# Patient Record
Sex: Female | Born: 1960 | Race: Black or African American | Hispanic: No | Smoking: Former smoker
Health system: Southern US, Community
[De-identification: ages and names within clinical notes are randomized; demographics above are authoritative.]

## PROBLEM LIST (undated history)

## (undated) DIAGNOSIS — L900 Lichen sclerosus et atrophicus: Secondary | ICD-10-CM

## (undated) DIAGNOSIS — K5900 Constipation, unspecified: Secondary | ICD-10-CM

## (undated) DIAGNOSIS — D071 Carcinoma in situ of vulva: Secondary | ICD-10-CM

## (undated) DIAGNOSIS — E7800 Pure hypercholesterolemia, unspecified: Secondary | ICD-10-CM

## (undated) DIAGNOSIS — R202 Paresthesia of skin: Secondary | ICD-10-CM

## (undated) DIAGNOSIS — Z79890 Hormone replacement therapy (postmenopausal): Secondary | ICD-10-CM

## (undated) DIAGNOSIS — M7989 Other specified soft tissue disorders: Secondary | ICD-10-CM

## (undated) DIAGNOSIS — R011 Cardiac murmur, unspecified: Secondary | ICD-10-CM

## (undated) DIAGNOSIS — R200 Anesthesia of skin: Secondary | ICD-10-CM

## (undated) DIAGNOSIS — T7840XA Allergy, unspecified, initial encounter: Secondary | ICD-10-CM

## (undated) DIAGNOSIS — R252 Cramp and spasm: Secondary | ICD-10-CM

## (undated) DIAGNOSIS — F32A Depression, unspecified: Secondary | ICD-10-CM

## (undated) DIAGNOSIS — C55 Malignant neoplasm of uterus, part unspecified: Secondary | ICD-10-CM

## (undated) DIAGNOSIS — F329 Major depressive disorder, single episode, unspecified: Secondary | ICD-10-CM

## (undated) DIAGNOSIS — J45909 Unspecified asthma, uncomplicated: Secondary | ICD-10-CM

## (undated) DIAGNOSIS — D649 Anemia, unspecified: Secondary | ICD-10-CM

## (undated) DIAGNOSIS — N390 Urinary tract infection, site not specified: Secondary | ICD-10-CM

## (undated) DIAGNOSIS — F419 Anxiety disorder, unspecified: Secondary | ICD-10-CM

## (undated) DIAGNOSIS — K219 Gastro-esophageal reflux disease without esophagitis: Secondary | ICD-10-CM

## (undated) DIAGNOSIS — I10 Essential (primary) hypertension: Secondary | ICD-10-CM

## (undated) HISTORY — DX: Pure hypercholesterolemia, unspecified: E78.00

## (undated) HISTORY — DX: Allergy, unspecified, initial encounter: T78.40XA

## (undated) HISTORY — DX: Carcinoma in situ of vulva: D07.1

## (undated) HISTORY — DX: Anxiety disorder, unspecified: F41.9

## (undated) HISTORY — DX: Malignant neoplasm of uterus, part unspecified: C55

## (undated) HISTORY — DX: Constipation, unspecified: K59.00

## (undated) HISTORY — DX: Urinary tract infection, site not specified: N39.0

## (undated) HISTORY — DX: Lichen sclerosus et atrophicus: L90.0

## (undated) HISTORY — PX: WISDOM TOOTH EXTRACTION: SHX21

## (undated) HISTORY — DX: Essential (primary) hypertension: I10

## (undated) HISTORY — DX: Major depressive disorder, single episode, unspecified: F32.9

## (undated) HISTORY — PX: VAGINAL HYSTERECTOMY: SUR661

## (undated) HISTORY — PX: SPINE SURGERY: SHX786

## (undated) HISTORY — DX: Anemia, unspecified: D64.9

## (undated) HISTORY — PX: EYE SURGERY: SHX253

## (undated) HISTORY — DX: Unspecified asthma, uncomplicated: J45.909

## (undated) HISTORY — DX: Hormone replacement therapy: Z79.890

## (undated) HISTORY — DX: Depression, unspecified: F32.A

---

## 1989-05-24 DIAGNOSIS — C55 Malignant neoplasm of uterus, part unspecified: Secondary | ICD-10-CM

## 1989-05-24 HISTORY — DX: Malignant neoplasm of uterus, part unspecified: C55

## 2011-06-05 DIAGNOSIS — Z23 Encounter for immunization: Secondary | ICD-10-CM | POA: Diagnosis not present

## 2011-06-10 DIAGNOSIS — F329 Major depressive disorder, single episode, unspecified: Secondary | ICD-10-CM | POA: Diagnosis not present

## 2011-06-10 DIAGNOSIS — F3289 Other specified depressive episodes: Secondary | ICD-10-CM | POA: Diagnosis not present

## 2011-07-27 DIAGNOSIS — N390 Urinary tract infection, site not specified: Secondary | ICD-10-CM | POA: Diagnosis not present

## 2011-07-28 DIAGNOSIS — N390 Urinary tract infection, site not specified: Secondary | ICD-10-CM | POA: Diagnosis not present

## 2011-08-11 DIAGNOSIS — L940 Localized scleroderma [morphea]: Secondary | ICD-10-CM | POA: Diagnosis not present

## 2011-08-11 DIAGNOSIS — N952 Postmenopausal atrophic vaginitis: Secondary | ICD-10-CM | POA: Diagnosis not present

## 2011-08-11 DIAGNOSIS — IMO0002 Reserved for concepts with insufficient information to code with codable children: Secondary | ICD-10-CM | POA: Diagnosis not present

## 2011-08-31 DIAGNOSIS — N952 Postmenopausal atrophic vaginitis: Secondary | ICD-10-CM | POA: Diagnosis not present

## 2011-08-31 DIAGNOSIS — IMO0002 Reserved for concepts with insufficient information to code with codable children: Secondary | ICD-10-CM | POA: Diagnosis not present

## 2011-08-31 DIAGNOSIS — L940 Localized scleroderma [morphea]: Secondary | ICD-10-CM | POA: Diagnosis not present

## 2011-09-30 DIAGNOSIS — E785 Hyperlipidemia, unspecified: Secondary | ICD-10-CM | POA: Diagnosis not present

## 2011-09-30 DIAGNOSIS — D649 Anemia, unspecified: Secondary | ICD-10-CM | POA: Diagnosis not present

## 2011-09-30 DIAGNOSIS — R7309 Other abnormal glucose: Secondary | ICD-10-CM | POA: Diagnosis not present

## 2011-09-30 DIAGNOSIS — I10 Essential (primary) hypertension: Secondary | ICD-10-CM | POA: Diagnosis not present

## 2011-10-12 DIAGNOSIS — N952 Postmenopausal atrophic vaginitis: Secondary | ICD-10-CM | POA: Diagnosis not present

## 2011-10-12 DIAGNOSIS — IMO0002 Reserved for concepts with insufficient information to code with codable children: Secondary | ICD-10-CM | POA: Diagnosis not present

## 2011-11-29 DIAGNOSIS — J309 Allergic rhinitis, unspecified: Secondary | ICD-10-CM | POA: Diagnosis not present

## 2011-12-29 DIAGNOSIS — N644 Mastodynia: Secondary | ICD-10-CM | POA: Diagnosis not present

## 2012-01-05 DIAGNOSIS — R209 Unspecified disturbances of skin sensation: Secondary | ICD-10-CM | POA: Diagnosis not present

## 2012-01-05 DIAGNOSIS — N644 Mastodynia: Secondary | ICD-10-CM | POA: Diagnosis not present

## 2012-01-05 DIAGNOSIS — I491 Atrial premature depolarization: Secondary | ICD-10-CM | POA: Diagnosis not present

## 2012-01-05 DIAGNOSIS — R920 Mammographic microcalcification found on diagnostic imaging of breast: Secondary | ICD-10-CM | POA: Diagnosis not present

## 2012-02-14 DIAGNOSIS — L940 Localized scleroderma [morphea]: Secondary | ICD-10-CM | POA: Diagnosis not present

## 2012-02-14 DIAGNOSIS — D071 Carcinoma in situ of vulva: Secondary | ICD-10-CM | POA: Diagnosis not present

## 2012-03-14 DIAGNOSIS — M545 Low back pain, unspecified: Secondary | ICD-10-CM | POA: Diagnosis not present

## 2012-04-04 DIAGNOSIS — E785 Hyperlipidemia, unspecified: Secondary | ICD-10-CM | POA: Diagnosis not present

## 2012-04-04 DIAGNOSIS — Z23 Encounter for immunization: Secondary | ICD-10-CM | POA: Diagnosis not present

## 2012-04-04 DIAGNOSIS — D649 Anemia, unspecified: Secondary | ICD-10-CM | POA: Diagnosis not present

## 2012-04-04 DIAGNOSIS — R7309 Other abnormal glucose: Secondary | ICD-10-CM | POA: Diagnosis not present

## 2012-04-04 DIAGNOSIS — I10 Essential (primary) hypertension: Secondary | ICD-10-CM | POA: Diagnosis not present

## 2012-04-27 DIAGNOSIS — J0100 Acute maxillary sinusitis, unspecified: Secondary | ICD-10-CM | POA: Diagnosis not present

## 2012-06-26 ENCOUNTER — Ambulatory Visit (INDEPENDENT_AMBULATORY_CARE_PROVIDER_SITE_OTHER): Payer: Medicare Other | Admitting: Family Medicine

## 2012-06-26 VITALS — BP 130/75 | HR 85 | Temp 98.3°F | Resp 18 | Ht 66.0 in | Wt 186.0 lb

## 2012-06-26 DIAGNOSIS — N898 Other specified noninflammatory disorders of vagina: Secondary | ICD-10-CM | POA: Diagnosis not present

## 2012-06-26 DIAGNOSIS — N951 Menopausal and female climacteric states: Secondary | ICD-10-CM | POA: Diagnosis not present

## 2012-06-26 DIAGNOSIS — R5381 Other malaise: Secondary | ICD-10-CM | POA: Diagnosis not present

## 2012-06-26 DIAGNOSIS — R5383 Other fatigue: Secondary | ICD-10-CM

## 2012-06-26 LAB — TSH: TSH: 1.358 u[IU]/mL (ref 0.350–4.500)

## 2012-06-26 LAB — POCT CBC
Lymph, poc: 3.4 (ref 0.6–3.4)
MCHC: 31.3 g/dL — AB (ref 31.8–35.4)
MID (cbc): 0.6 (ref 0–0.9)
MPV: 10.2 fL (ref 0–99.8)
POC Granulocyte: 5.6 (ref 2–6.9)
POC LYMPH PERCENT: 35.1 %L (ref 10–50)
POC MID %: 6.7 %M (ref 0–12)
Platelet Count, POC: 181 10*3/uL (ref 142–424)
RDW, POC: 14.7 %

## 2012-06-26 LAB — POCT WET PREP WITH KOH

## 2012-06-26 MED ORDER — FLUCONAZOLE 150 MG PO TABS: 150.0000 mg | ORAL_TABLET | Freq: Once | ORAL | Status: DC

## 2012-06-26 MED ORDER — METRONIDAZOLE 500 MG PO TABS: 500.0000 mg | ORAL_TABLET | Freq: Two times a day (BID) | ORAL | Status: DC

## 2012-06-26 NOTE — Progress Notes (Signed)
Subjective:    Patient ID: Kristin Merritt, female    DOB: 07-14-1960, 52 y.o.   MRN: [id number] Chief Complaint  Patient presents with  . Vaginal Discharge    possible yeast x 3 days  . Hot Flashes    HPI  Kristin Merritt is a pleasant 52 yo woman who has been having copious vaginal discharge for the past several days.  It started rather suddenly - in fact she thought she was getting her menses as she felt a warm gooey fluid in her underwear. Having a copious amount of white creamy discharge - not cottage cheese like, no itching, no odor, no n/v. Is urinating freq due to hctz and drinking a lot of water but no changes in freq, no incontinence or dysuria. Has not used estrace - vaginal cream - in over a month.   Having a little constipation with a little abdominal pain. Is sexually active - last was 1 mo ago, and did not use protection. Last pap smear was early Dec in another city - was normal - has had a hysterectomy - so maybe just had a pelvic exam - she is not sure.   Is having a lot of hot flashes - is miserable.  Periods stopped in 1991 when she had her hysterectomy but they left ovaries in - first noticed hot flashes 2 wks ago and no other menopausal symptoms other than hot flashes.   Has an appt w/ a new PCP at March 30th and does not have an OB doctor. OB in [place] didn't know anyone here to refer her to.  Past Medical History  Diagnosis Date  . Allergy   . Anxiety    Past Surgical History  Procedure Date  . Eye surgery   . Abdominal hysterectomy   . Spine surgery     Current Outpatient Prescriptions on File Prior to Visit  Medication Sig Dispense Refill  . albuterol (PROVENTIL HFA;VENTOLIN HFA) 108 (90 BASE) MCG/ACT inhaler Inhale 2 puffs into the lungs every 6 (six) hours as needed.      Marland Kitchen atorvastatin (LIPITOR) 20 MG tablet Take 20 mg by mouth daily.      . Fluticasone-Salmeterol (ADVAIR) 250-50 MCG/DOSE AEPB Inhale 1 puff into the lungs as needed.      . lansoprazole (PREVACID)  30 MG capsule Take 30 mg by mouth daily.      . Olmesartan-Amlodipine-HCTZ 40-5-12.5 MG TABS Take by mouth 1 day or 1 dose.       No Known Allergies   Review of Systems  Constitutional: Positive for diaphoresis. Negative for fever, chills, activity change, appetite change, fatigue and unexpected weight change.  Gastrointestinal: Negative for abdominal pain, diarrhea, constipation, blood in stool, anal bleeding and rectal pain.  Genitourinary: Positive for frequency and vaginal discharge. Negative for dysuria, urgency, hematuria, decreased urine volume, vaginal bleeding, difficulty urinating, genital sores, vaginal pain, menstrual problem, pelvic pain and dyspareunia.  Musculoskeletal: Negative for gait problem.  Skin: Negative for rash.  Hematological: Negative for adenopathy.  Psychiatric/Behavioral: Negative for dysphoric mood. The patient is not nervous/anxious.       BP 130/75  Pulse 85  Temp 98.3 F (36.8 C)  Resp 18  Ht 5\' 6"  (1.676 m)  Wt 186 lb (84.369 kg)  BMI 30.02 kg/m2  SpO2 98% Objective:   Physical Exam  Constitutional: She is oriented to person, place, and time. She appears well-developed and well-nourished. No distress.  HENT:  Head: Normocephalic and atraumatic.  Cardiovascular: Normal rate, regular  rhythm, normal heart sounds and intact distal pulses.   Pulmonary/Chest: Effort normal and breath sounds normal.  Abdominal: Soft. Bowel sounds are normal. She exhibits no distension. There is no tenderness. There is no rebound and no guarding.  Genitourinary: Uterus normal. Pelvic exam was performed with patient supine. There is no rash, tenderness or lesion on the right labia. There is no rash, tenderness or lesion on the left labia. Cervix exhibits discharge. Cervix exhibits no motion tenderness and no friability. Right adnexum displays no mass, no tenderness and no fullness. Left adnexum displays no mass, no tenderness and no fullness. No erythema or tenderness around  the vagina. Vaginal discharge found.       Copious amount of thin white-grayish discharge  Lymphadenopathy:       Right: No inguinal adenopathy present.       Left: No inguinal adenopathy present.  Neurological: She is alert and oriented to person, place, and time.  Skin: Skin is warm and dry. She is not diaphoretic.  Psychiatric: She has a normal mood and affect. Her behavior is normal.          Results for orders placed in visit on 06/26/12  POCT WET PREP WITH KOH      Component Value Range   Trichomonas, UA Negative     Clue Cells Wet Prep HPF POC 0-2     Epithelial Wet Prep HPF POC 5-8     Yeast Wet Prep HPF POC neg     Bacteria Wet Prep HPF POC 1+     RBC Wet Prep HPF POC neg     WBC Wet Prep HPF POC 0-1     KOH Prep POC Negative    POCT CBC      Component Value Range   WBC 9.7  4.6 - 10.2 K/uL   Lymph, poc 3.4  0.6 - 3.4   POC LYMPH PERCENT 35.1  10 - 50 %L   MID (cbc) 0.6  0 - 0.9   POC MID % 6.7  0 - 12 %M   POC Granulocyte 5.6  2 - 6.9   Granulocyte percent 58.2  37 - 80 %G   RBC 4.90  4.04 - 5.48 M/uL   Hemoglobin 13.2  12.2 - 16.2 g/dL   HCT, POC 81.1  91.4 - 47.9 %   MCV 86.2  80 - 97 fL   MCH, POC 26.9 (*) 27 - 31.2 pg   MCHC 31.3 (*) 31.8 - 35.4 g/dL   RDW, POC 78.2     Platelet Count, POC 181  142 - 424 K/uL   MPV 10.2  0 - 99.8 fL    Assessment & Plan:   1. Fatigue  POCT CBC, TSH  2. Vaginal discharge  POCT Wet Prep with KOH, metroNIDAZOLE (FLAGYL) 500 MG tablet, fluconazole (DIFLUCAN) 150 MG tablet  3. Hot flash, menopausal  Ambulatory referral to Gynecology  Pt is on uti preventative cipro as well as prn estrogen vag cream which could all contribute to yeast. However, discharge on exam was more consistent w/ BV. As the wet prep did not show either - but pt clearly needs treatment as she does have COPIOUS discharge - will cover for both. RTC if sxs cont or recur after treatment. Gave info to pt on herbal supplements that some people use for hot  flashes and she is going to try but she is also interested in considering HRT so will refer to gyn for further discussion and poss  treatment since she could not get a PCP appt for another 2 mos. Meds ordered this encounter  Medications                                                                                      . metroNIDAZOLE (FLAGYL) 500 MG tablet    Sig: Take 1 tablet (500 mg total) by mouth 2 (two) times daily with a meal. DO NOT CONSUME ALCOHOL WHILE TAKING THIS MEDICATION.    Dispense:  14 tablet    Refill:  0  . fluconazole (DIFLUCAN) 150 MG tablet    Sig: Take 1 tablet (150 mg total) by mouth once. Repeat if needed    Dispense:  2 tablet    Refill:  0

## 2012-06-26 NOTE — Patient Instructions (Signed)
Sometimes antidepressants like zoloft can help with hot flashes and some women need hormone replacement therapy (by taking estrogen) - however, this can be connected with increased cardiac and cancer risk so would only want to be done with careful monitoring by your regular doctor.  Menopause and Herbal Products Menopause is the normal time of life when menstrual periods stop completely. Menopause is complete when you have missed 12 consecutive menstrual periods. It usually occurs between the ages of 55 to 58, with an average age of 13. Very rarely does a woman develop menopause before 52 years old. At menopause, your ovaries stop producing the female hormones, estrogen and progesterone. This can cause undesirable symptoms and also affect your health. Sometimes the symptoms can occur 4 to 5 years before the menopause begins. There is no relationship between menopause and:  Oral contraceptives.  Number of children you had.  Race.  The age your menstrual periods started (menarche). Heavy smokers and very thin women may develop menopause earlier in life. Estrogen and progesterone hormone treatment is the usual method of treating menopausal symptoms. However, there are women who should not take hormone treatment. This is true of:   Women that have breast or uterine cancer.  Women who prefer not to take hormones because of certain side effects (abnormal uterine bleeding).  Women who are afraid that hormones may cause breast cancer.  Women who have a history of liver disease, heart disease, stroke, or blood clots. For these women, there are other medications that may help treat their menopausal symptoms. These medications are found in plants and botanical products. They can be found in the form of herbs, teas, oils, tinctures, and pills.  CAUSES:  The ovaries stop producing the female hormones estrogen and progesterone.  Other causes include:  Surgery to remove both ovaries.  The ovaries  stop functioning for no know reason.  Tumors of the pituitary gland in the brain.  Medical disease that affects the ovaries and hormone production.  Radiation treatment to the abdomen or pelvis.  Chemotherapy that affects the ovaries. PHYTOESTROGENS: Phytoestrogen's occur naturally in plants and plant products. They act like estrogen in the body. Herbal medications are made from these plants and botanical steroids. There are 3 types of phytoestrogens:  Isoflavones (genistein and daidzein) are found in soy, garbanzo beans, miso and tofu foods.  Ligins are found in the shell of seeds. They are used to make oils like flaxseed oil. The bacteria in your intestine act on these foods to produce the estrogen-like hormones.  Coumestans are estrogen-like. Some of the foods they are found in include sunflower seeds and bean sprouts. CONDITIONS AND THERE POSSIBLE HERBAL TREATMENT:  Hot flashes and night sweats.  Soy, black cohosh and evening primrose.  Irritability, insomnia, depression and memory problems.  Chasteberry, ginseng, and soy.  St. John's wort may be helpful for depression. However, there is a concern of it causing cataracts of the eye and may have bad effects on other medications. St. John's wort should not be taken for long time and without your caregiver's advice.  Loss of libido and vaginal and skin dryness.  Wild yam and soy.  Prevention of coronary heart disease and osteoporosis.  Soy and Isoflavones. Several studies have shown that some women benefit from herbal medications, but most of the studies have not consistently shown that these supplements are much better than placebo. Other forms of treatment to help women with menopausal symptoms include a balanced diet, rest, exercise, vitamin and calcium (with  vitamin D) supplements, acupuncture, and group therapy when necessary. THOSE WHO SHOULD NOT TAKE HERBAL MEDICATIONS INCLUDE:  Women who are planning on getting  pregnant unless told by your caregiver.  Women who are breastfeeding unless told by your caregiver.  Women who are taking other prescription medications unless told by your caregiver.  Infants, children, and elderly women unless told by your caregiver. Different herbal medications have different and unmeasured amounts of the herbal ingredients. There are no regulations, quality control, and standardization of the ingredients in herbal medications. Therefore, the amount of the ingredient in the medication may vary from one herb, pill, tea, oil or tincture to another. Many herbal medications can cause serious problems and can even have poisonous effects if taken too much or too long. If problems develop, the medication should be stopped and recorded by your caregiver. HOME CARE INSTRUCTIONS  Do not take or give children herbal medications without your caregiver's advice.  Let your caregiver know all the medications you are taking. This includes prescription, over-the-counter, eye drops, and creams.  Do not take herbal medications longer or more than recommended.  Tell your caregiver about any side effects from the medication. SEEK MEDICAL CARE IF:  You develop a fever of 102 F (38.9 C), or as directed by your caregiver.  You feel sick to your stomach (nauseous), vomit, or have diarrhea.  You develop a rash.  You develop abdominal pain.  You develop severe headaches.  You start to have vision problems.  You feel dizzy or faint.  You start to feel numbness in any part of your body.  You start shaking (have convulsions). Document Released: 10/27/2007 Document Revised: 08/02/2011 Document Reviewed: 05/26/2010 [facility] Patient Information 2013 [place], [place]. Perimenopause Perimenopause is the time when your body begins to move into the menopause (no menstrual period for 12 straight months). It is a natural process. Perimenopause can begin 2 to 8 years before the menopause and  usually lasts for one year after the menopause. During this time, your ovaries may or may not produce an egg. The ovaries vary in their production of estrogen and progesterone hormones each month. This can cause irregular menstrual periods, difficulty in getting pregnant, vaginal bleeding between periods and uncomfortable symptoms. CAUSES  Irregular production of the ovarian hormones, estrogen and progesterone, and not ovulating every month.  Other causes include:  Tumor of the pituitary gland in the brain.  Medical disease that affects the ovaries.  Radiation treatment.  Chemotherapy.  Unknown causes.  Heavy smoking and excessive alcohol intake can bring on perimenopause sooner. SYMPTOMS   Hot flashes.  Night sweats.  Irregular menstrual periods.  Decrease sex drive.  Vaginal dryness.  Headaches.  Mood swings.  Depression.  Memory problems.  Irritability.  Tiredness.  Weight gain.  Trouble getting pregnant.  The beginning of losing bone cells (osteoporosis).  The beginning of hardening of the arteries (atherosclerosis). DIAGNOSIS  Your caregiver will make a diagnosis by analyzing your age, menstrual history and your symptoms. They will do a physical exam noting any changes in your body, especially your female organs. Female hormone tests may or may not be helpful depending on the amount and when you produce the female hormones. However, other hormone tests may be helpful (ex. thyroid hormone) to rule out other problems. TREATMENT  The decision to treat during the perimenopause should be made by you and your caregiver depending on how the symptoms are affecting you and your life style. There are various treatments available such as:  Treating individual symptoms with a specific medication for that symptom (ex. tranquilizer for depression).  Herbal medications that can help specific symptoms.  Counseling.  Group therapy.  No treatment. HOME CARE  INSTRUCTIONS   Before seeing your caregiver, make a list of your menstrual periods (when the occur, how heavy they are, how long between periods and how long they last), your symptoms and when they started.  Take the medication as recommended by your caregiver.  Sleep and rest.  Exercise.  Eat a diet that contains calcium (good for your bones) and soy (acts like estrogen hormone).  Do not smoke.  Avoid alcoholic beverages.  Taking vitamin E may help in certain cases.  Take calcium and vitamin D supplements to help prevent bone loss.  Group therapy is sometimes helpful.  Acupuncture may help in some cases. SEEK MEDICAL CARE IF:   You have any of the above and want to know if it is perimenopause.  You want advice and treatment for any of your symptoms mentioned above.  You need a referral to a specialist (gynecologist, psychiatrist or psychologist). SEEK IMMEDIATE MEDICAL CARE IF:   You have vaginal bleeding.  Your period lasts longer than 8 days.  You periods are recurring sooner than 21 days.  You have bleeding after intercourse.  You have severe depression.  You have pain when you urinate.  You have severe headaches.  You develop vision problems. Document Released: 06/17/2004 Document Revised: 08/02/2011 Document Reviewed: 03/07/2008 [facility] Patient Information 2013 [place], [place].

## 2012-06-27 ENCOUNTER — Encounter: Payer: Self-pay | Admitting: Family Medicine

## 2012-07-05 ENCOUNTER — Ambulatory Visit: Payer: Self-pay | Admitting: Gynecology

## 2012-07-08 ENCOUNTER — Other Ambulatory Visit: Payer: Self-pay

## 2012-07-11 ENCOUNTER — Encounter: Payer: Self-pay | Admitting: Gynecology

## 2012-07-11 ENCOUNTER — Ambulatory Visit (INDEPENDENT_AMBULATORY_CARE_PROVIDER_SITE_OTHER): Payer: Medicare Other | Admitting: Gynecology

## 2012-07-11 VITALS — BP 122/80 | Ht 64.75 in | Wt 192.0 lb

## 2012-07-11 DIAGNOSIS — Z79890 Hormone replacement therapy (postmenopausal): Secondary | ICD-10-CM | POA: Diagnosis not present

## 2012-07-11 DIAGNOSIS — N951 Menopausal and female climacteric states: Secondary | ICD-10-CM | POA: Diagnosis not present

## 2012-07-11 HISTORY — DX: Hormone replacement therapy: Z79.890

## 2012-07-11 MED ORDER — ESTRADIOL 1 MG PO TABS: 1.0000 mg | ORAL_TABLET | Freq: Every day | ORAL | Status: DC

## 2012-07-11 NOTE — Progress Notes (Signed)
Patient is a 52 year old new patient to the practice who moved here from new [facility]. She has been complaining over the past month of severe hot flashes and night sweats. Patient has a past history of a transvaginal hysterectomy in [place] as a result of menorrhagia and dysmenorrhea. A few months ago in another town she had been prescribed Estrace vaginal cream but she has been off of it now for one month. She is sexually active. She does have vaginal dryness at times. She denies any mood swings or irritability. She denies any past history of abnormal Pap smears. She stated her last Pap smear was in 2013. She was treated a few weeks ago at the urgent care for BV and moniliasis. She has been referred to a new PCPto manage her  asthma, and hyperlipidemia (Dr. Herold Harms). Patient's last mammogram was a year ago.  We were through a lengthy discussion on the menopause as well as on hormone replacement therapy. We discussed the risks benefits and pros and cons of hormone replacement therapy as well as the women's health initiative study and the risk of breast cancer. We discussed different treatment options such as oral, transdermal patches, estrogen rings, as well as gels. Patient like to proceed because her quality of life is been affected. She will be started on estradiol 1 mg to take 1 by mouth daily. She was reminded to take calcium 1200 mg daily and vitamin D  1000-2000 units per day. We discussed importance of regular exercise for osteoporosis prevention. She will return back in 6 months for followup and annual exam. She was provided with a requisition to schedule mammogram. Literature information on the menopause and hormone replacement therapy was also provided. Patient is a nonsmoker and denies any family history of any bleeding or clotting disorders.

## 2012-07-11 NOTE — Patient Instructions (Addendum)
Menopause Menopause is the normal time of life when menstrual periods stop completely. Menopause is complete when you have missed 12 consecutive menstrual periods. It usually occurs between the ages of 48 to 55, with an average age of 51. Very rarely does a woman develop menopause before 52 years old. At menopause, your ovaries stop producing the female hormones, estrogen and progesterone. This can cause undesirable symptoms and also affect your health. Sometimes the symptoms may occur 4 to 5 years before the menopause begins. There is no relationship between menopause and:  Oral contraceptives.  Number of children you had.  Race.  The age your menstrual periods started (menarche). Heavy smokers and very thin women may develop menopause earlier in life. CAUSES  The ovaries stop producing the female hormones estrogen and progesterone.  Other causes include:  Surgery to remove both ovaries.  The ovaries stop functioning for no known reason.  Tumors of the pituitary gland in the brain.  Medical disease that affects the ovaries and hormone production.  Radiation treatment to the abdomen or pelvis.  Chemotherapy that affects the ovaries. SYMPTOMS   Hot flashes.  Night sweats.  Decrease in sex drive.  Vaginal dryness and thinning of the vagina causing painful intercourse.  Dryness of the skin and developing wrinkles.  Headaches.  Tiredness.  Irritability.  Memory problems.  Weight gain.  Bladder infections.  Hair growth of the face and chest.  Infertility. More serious symptoms include:  Loss of bone (osteoporosis) causing breaks (fractures).  Depression.  Hardening and narrowing of the arteries (atherosclerosis) causing heart attacks and strokes. DIAGNOSIS   When the menstrual periods have stopped for 12 straight months.  Physical exam.  Hormone studies of the blood. TREATMENT  There are many treatment choices and nearly as many questions about them.  The decisions to treat or not to treat menopausal changes is an individual choice made with your caregiver. Your caregiver can discuss the treatments with you. Together, you can decide which treatment will work best for you. Your treatment choices may include:   Hormone therapy (estorgen and progesterone).  Non-hormonal medications.  Treating the individual symptoms with medication (for example antidepressants for depression).  Herbal medications that may help specific symptoms.  Counseling by a psychiatrist or psychologist.  Group therapy.  Lifestyle changes including:  Eating healthy.  Regular exercise.  Limiting caffeine and alcohol.  Stress management and meditation.  No treatment. HOME CARE INSTRUCTIONS   Take the medication your caregiver gives you as directed.  Get plenty of sleep and rest.  Exercise regularly.  Eat a diet that contains calcium (good for the bones) and soy products (acts like estrogen hormone).  Avoid alcoholic beverages.  Do not smoke.  If you have hot flashes, dress in layers.  Take supplements, calcium and vitamin D to strengthen bones.  You can use over-the-counter lubricants or moisturizers for vaginal dryness.  Group therapy is sometimes very helpful.  Acupuncture may be helpful in some cases. SEEK MEDICAL CARE IF:   You are not sure you are in menopause.  You are having menopausal symptoms and need advice and treatment.  You are still having menstrual periods after age 55.  You have pain with intercourse.  Menopause is complete (no menstrual period for 12 months) and you develop vaginal bleeding.  You need a referral to a specialist (gynecologist, psychiatrist or psychologist) for treatment. SEEK IMMEDIATE MEDICAL CARE IF:   You have severe depression.  You have excessive vaginal bleeding.  You fell and   think you have a broken bone.  You have pain when you urinate.  You develop leg or chest pain.  You have a fast  pounding heart beat (palpitations).  You have severe headaches.  You develop vision problems.  You feel a lump in your breast.  You have abdominal pain or severe indigestion. Document Released: 07/31/2003 Document Revised: 08/02/2011 Document Reviewed: 03/07/2008 [facility] Patient Information 2013 [place], [place].   Hormone Therapy At menopause, your body begins making less estrogen and progesterone hormones. This causes the body to stop having menstrual periods. This is because estrogen and progesterone hormones control your periods and menstrual cycle. A lack of estrogen may cause symptoms such as:  Hot flushes (or hot flashes).  Vaginal dryness.  Dry skin.  Loss of sex drive.  Risk of bone loss (osteoporosis). When this happens, you may choose to take hormone therapy to get back the estrogen lost during menopause. When the hormone estrogen is given alone, it is usually referred to as ET (Estrogen Therapy). When the hormone progestin is combined with estrogen, it is generally called HT (Hormone Therapy). This was formerly known as hormone replacement therapy (HRT). Your caregiver can help you make a decision on what will be best for you. The decision to use HT seems to change often as new studies are done. Many studies do not agree on the benefits of hormone replacement therapy. LIKELY BENEFITS OF HT INCLUDE PROTECTION FROM:  Hot Flushes (also called hot flashes) - A hot flush is a sudden feeling of heat that spreads over the face and body. The skin may redden like a blush. It is connected with sweats and sleep disturbance. Women going through menopause may have hot flushes a few times a month or several times per day depending on the woman.  Osteoporosis (bone loss)- Estrogen helps guard against bone loss. After menopause, a woman's bones slowly lose calcium and become weak and brittle. As a result, bones are more likely to break. The hip, wrist, and spine are affected most often.  Hormone therapy can help slow bone loss after menopause. Weight bearing exercise and taking calcium with vitamin D also can help prevent bone loss. There are also medications that your caregiver can prescribe that can help prevent osteoporosis.  Vaginal Dryness - Loss of estrogen causes changes in the vagina. Its lining may become thin and dry. These changes can cause pain and bleeding during sexual intercourse. Dryness can also lead to infections. This can cause burning and itching. (Vaginal estrogen treatment can help relieve pain, itching, and dryness.)  Urinary Tract Infections are more common after menopause because of lack of estrogen. Some women also develop urinary incontinence because of low estrogen levels in the vagina and bladder.  Possible other benefits of estrogen include a positive effect on mood and short-term memory in women. RISKS AND COMPLICATIONS  Using estrogen alone without progesterone causes the lining of the uterus to grow. This increases the risk of lining of the uterus (endometrial) cancer. Your caregiver should give another hormone called progestin if you have a uterus.  Women who take combined (estrogen and progestin) HT appear to have an increased risk of breast cancer. The risk appears to be small, but increases throughout the time that HT is taken.  Combined therapy also makes the breast tissue slightly denser which makes it harder to read mammograms (breast X-rays).  Combined, estrogen and progesterone therapy can be taken together every day, in which case there may be spotting of blood. HT  therapy can be taken cyclically in which case you will have menstrual periods. Cyclically means HT is taken for a set amount of days, then not taken, then this process is repeated.  HT may increase the risk of stroke, heart attack, breast cancer and forming blood clots in your leg.  Transdermal estrogen (estrogen that is absorbed through the skin with a patch or a cream) may  have more positive results with:  Cholesterol.  Blood pressure.  Blood clots. Having the following conditions may indicate you should not have HT:  Endometrial cancer.  Liver disease.  Breast cancer.  Heart disease.  History of blood clots.  Stroke. TREATMENT   If you choose to take HT and have a uterus, usually estrogen and progestin are prescribed.  Your caregiver will help you decide the best way to take the medications.  Possible ways to take estrogen include:  Pills.  Patches.  Gels.  Sprays.  Vaginal estrogen cream, rings and tablets.  It is best to take the lowest dose possible that will help your symptoms and take them for the shortest period of time that you can.  Hormone therapy can help relieve some of the problems (symptoms) that affect women at menopause. Before making a decision about HT, talk to your caregiver about what is best for you. Be well informed and comfortable with your decisions. HOME CARE INSTRUCTIONS   Follow your caregivers advice when taking the medications.  A Pap test is done to screen for cervical cancer.  The first Pap test should be done at age 24.  Between ages 70 and 58, Pap tests are repeated every 2 years.  Beginning at age 69, you are advised to have a Pap test every 3 years as long as your past 3 Pap tests have been normal.  Some women have medical problems that increase the chance of getting cervical cancer. Talk to your caregiver about these problems. It is especially important to talk to your caregiver if a new problem develops soon after your last Pap test. In these cases, your caregiver may recommend more frequent screening and Pap tests.  The above recommendations are the same for women who have or have not gotten the vaccine for HPV (Human Papillomavirus).  If you had a hysterectomy for a problem that was not a cancer or a condition that could lead to cancer, then you no longer need Pap tests. However, even if  you no longer need a Pap test, a regular exam is a good idea to make sure no other problems are starting.   If you are between ages 62 and 45, and you have had normal Pap tests going back 10 years, you no longer need Pap tests. However, even if you no longer need a Pap test, a regular exam is a good idea to make sure no other problems are starting.   If you have had past treatment for cervical cancer or a condition that could lead to cancer, you need Pap tests and screening for cancer for at least 20 years after your treatment.  If Pap tests have been discontinued, risk factors (such as a new sexual partner) need to be re-assessed to determine if screening should be resumed.  Some women may need screenings more often if they are at high risk for cervical cancer.  Get mammograms done as per the advice of your caregiver. SEEK IMMEDIATE MEDICAL CARE IF:  You develop abnormal vaginal bleeding.  You have pain or swelling in your legs,  shortness of breath, or chest pain.  You develop dizziness or headaches.  You have lumps or changes in your breasts or armpits.  You have slurred speech.  You develop weakness or numbness of your arms or legs.  You have pain, burning, or bleeding when urinating.  You develop abdominal pain. Document Released: 02/06/2003 Document Revised: 08/02/2011 Document Reviewed: 05/27/2010 [facility] Patient Information 2013 [place], [place].

## 2012-07-13 ENCOUNTER — Encounter: Payer: Self-pay | Admitting: Gynecology

## 2012-07-13 ENCOUNTER — Other Ambulatory Visit: Payer: Self-pay | Admitting: Gynecology

## 2012-07-13 DIAGNOSIS — Z1231 Encounter for screening mammogram for malignant neoplasm of breast: Secondary | ICD-10-CM

## 2012-07-28 ENCOUNTER — Ambulatory Visit (HOSPITAL_COMMUNITY): Payer: Medicare Other

## 2012-08-01 ENCOUNTER — Ambulatory Visit (INDEPENDENT_AMBULATORY_CARE_PROVIDER_SITE_OTHER): Payer: Medicare Other | Admitting: Family Medicine

## 2012-08-01 ENCOUNTER — Encounter: Payer: Self-pay | Admitting: Family Medicine

## 2012-08-01 VITALS — BP 110/65 | HR 80 | Temp 98.1°F | Resp 18 | Wt 195.0 lb

## 2012-08-01 DIAGNOSIS — J029 Acute pharyngitis, unspecified: Secondary | ICD-10-CM | POA: Diagnosis not present

## 2012-08-01 DIAGNOSIS — B370 Candidal stomatitis: Secondary | ICD-10-CM

## 2012-08-01 DIAGNOSIS — R5383 Other fatigue: Secondary | ICD-10-CM

## 2012-08-01 DIAGNOSIS — J019 Acute sinusitis, unspecified: Secondary | ICD-10-CM

## 2012-08-01 DIAGNOSIS — N898 Other specified noninflammatory disorders of vagina: Secondary | ICD-10-CM

## 2012-08-01 MED ORDER — FIRST-DUKES MOUTHWASH MT SUSP: OROMUCOSAL | Status: DC

## 2012-08-01 MED ORDER — FLUCONAZOLE 150 MG PO TABS: 150.0000 mg | ORAL_TABLET | Freq: Once | ORAL | Status: DC

## 2012-08-01 MED ORDER — AMOXICILLIN 500 MG PO CAPS: 1000.0000 mg | ORAL_CAPSULE | Freq: Three times a day (TID) | ORAL | Status: DC

## 2012-08-01 MED ORDER — PREDNISONE 10 MG PO TABS: ORAL_TABLET | ORAL | Status: DC

## 2012-08-01 NOTE — Progress Notes (Signed)
Subjective:    Patient ID: Kristin Merritt, female    DOB: 01-31-1961, 52 y.o.   MRN: [id number] Chief Complaint  Patient presents with  . Sore Throat  . Otalgia     HPI Hot flashes are better.  Vaginal discharge went away after last treatmet.  Right side of tongue is white and looks like she has thrush - now with sore throat and swelling and adenopathy ion Right - trouble swallowing and burining. This morning now nare feels swollen and raw and she was snoring. Lots of pressure in the left side. Eyes sore and sinus sore.  Is pushing fluids. No home remidies but has been trying some otc fiber, stool softeners, nyquil.  No f/c, no dental problems, little rhinorrea, lots of ear pain - feels very deep. No SHoB, slight cough  Past Medical History  Diagnosis Date  . Allergy   . Anxiety   . Hypertension   . Lichen sclerosus et atrophicus   . 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 Postmenopausal HRT (hormone replacement therapy) - followed by Dr. Lily Peer in gyn 07/11/2012  . Asthma   . Uterine cancer   . Depression   . High cholesterol   . UTI (urinary tract infection)    Current Outpatient Prescriptions on File Prior to Visit  Medication Sig Dispense Refill  . albuterol (PROVENTIL HFA;VENTOLIN HFA) 108 (90 BASE) MCG/ACT inhaler Inhale 2 puffs into the lungs every 6 (six) hours as needed.      Marland Kitchen aspirin 81 MG tablet Take 81 mg by mouth daily.      Marland Kitchen docusate sodium (COLACE) 50 MG capsule Take by mouth 2 (two) times daily. Over the counter Equate stool softener      . Fluticasone-Salmeterol (ADVAIR) 250-50 MCG/DOSE AEPB Inhale 1 puff into the lungs as needed.       No current facility-administered medications on file prior to visit.   No Known Allergies   Review of Systems    BP 110/65  Pulse 80  Temp(Src) 98.1 F (36.7 C) (Oral)  Resp 18  Wt 195 lb (88.451 kg)  BMI 32.69 kg/m2 Objective:   Physical Exam  Constitutional: She is oriented to  person, place, and time. She appears well-developed and well-nourished. She appears lethargic. She appears ill. No distress.  HENT:  Head: Normocephalic and atraumatic.  Right Ear: External ear and ear canal normal. Tympanic membrane is retracted. A middle ear effusion is present.  Left Ear: External ear and ear canal normal. Tympanic membrane is injected and retracted. A middle ear effusion is present.  Nose: Mucosal edema and rhinorrhea present. Right sinus exhibits maxillary sinus tenderness. Left sinus exhibits maxillary sinus tenderness.  Mouth/Throat: Uvula is midline and mucous membranes are normal. Posterior oropharyngeal edema and posterior oropharyngeal erythema present. No oropharyngeal exudate or tonsillar abscesses.  Rt tonsil 1+, Lt tonsil nml. Tongue with thick white green coating, cannot scrape off  Eyes: Conjunctivae are normal. Right eye exhibits no discharge. Left eye exhibits no discharge. No scleral icterus.  Allergic shiners  Neck: Normal range of motion. Neck supple.  Cardiovascular: Normal rate, regular rhythm, normal heart sounds and intact distal pulses.   Pulmonary/Chest: Effort normal and breath sounds normal.  Lymphadenopathy:       Head (right side): Submandibular and tonsillar adenopathy present. No preauricular, no posterior auricular and no occipital adenopathy present.       Head (left side): Submandibular and tonsillar adenopathy present. No preauricular,  no posterior auricular and no occipital adenopathy present.    She has cervical adenopathy.       Right cervical: Superficial cervical adenopathy present.       Left cervical: Superficial cervical adenopathy present.       Right: No supraclavicular adenopathy present.       Left: No supraclavicular adenopathy present.  Neurological: She is oriented to person, place, and time. She appears lethargic.  Skin: Skin is warm and dry. She is not diaphoretic. No erythema.  Psychiatric: She has a normal mood and  affect. Her behavior is normal.      Assessment & Plan:  Vaginal discharge  Fatigue  Acute pharyngitis  Thrush - Plan: Diphenhyd-Hydrocort-Nystatin (FIRST-DUKES MOUTHWASH) SUSP, fluconazole (DIFLUCAN) 150 MG tablet  Sinusitis, acute - Plan: predniSONE (DELTASONE) 10 MG tablet, amoxicillin (AMOXIL) 500 MG capsule  Meds ordered this encounter  Medications  . DISCONTD: Diphenhyd-Hydrocort-Nystatin (FIRST-DUKES MOUTHWASH) SUSP    Sig: Gargle with 1 teaspon 4 times a day. Retain in mouth for as long as possible. Use till sxs resolve x 48 hrs.    Dispense:  237 mL    Refill:  0  . DISCONTD: predniSONE (DELTASONE) 10 MG tablet    Sig: Take 6 tabs today, 5 tabs tomorrow, 4 tabs po d3, 3 tabs x 1 d4, 2 tabs po x 1 d5, 1 tab po x1 d6    Dispense:  21 tablet    Refill:  0  . DISCONTD: fluconazole (DIFLUCAN) 150 MG tablet    Sig: Take 1 tablet (150 mg total) by mouth once. Repeat if needed    Dispense:  2 tablet    Refill:  0  . DISCONTD: amoxicillin (AMOXIL) 500 MG capsule    Sig: Take 2 capsules (1,000 mg total) by mouth 3 (three) times daily.    Dispense:  60 capsule    Refill:  0

## 2012-08-01 NOTE — Patient Instructions (Addendum)
Hot showers or breathing in steam may help loosen the congestion.  Using a netti pot or sinus rinse is also likely to help you feel better and keep this from progressing.  I recommend augmenting with 12 hr sudafed (behind the counter) and generic mucinex to help you move out the congestion.  If no improvement or you are getting worse, come back but hopefully with all of the above, you can avoid it.  Sinusitis Sinusitis is redness, soreness, and swelling (inflammation) of the paranasal sinuses. Paranasal sinuses are air pockets within the bones of your face (beneath the eyes, the middle of the forehead, or above the eyes). In healthy paranasal sinuses, mucus is able to drain out, and air is able to circulate through them by way of your nose. However, when your paranasal sinuses are inflamed, mucus and air can become trapped. This can allow bacteria and other germs to grow and cause infection. Sinusitis can develop quickly and last only a short time (acute) or continue over a long period (chronic). Sinusitis that lasts for more than 12 weeks is considered chronic.  CAUSES  Causes of sinusitis include:  Allergies.  Structural abnormalities, such as displacement of the cartilage that separates your nostrils (deviated septum), which can decrease the air flow through your nose and sinuses and affect sinus drainage.  Functional abnormalities, such as when the small hairs (cilia) that line your sinuses and help remove mucus do not work properly or are not present. SYMPTOMS  Symptoms of acute and chronic sinusitis are the same. The primary symptoms are pain and pressure around the affected sinuses. Other symptoms include:  Upper toothache.  Earache.  Headache.  Bad breath.  Decreased sense of smell and taste.  A cough, which worsens when you are lying flat.  Fatigue.  Fever.  Thick drainage from your nose, which often is green and may contain pus (purulent).  Swelling and warmth over the  affected sinuses. DIAGNOSIS  Your caregiver will perform a physical exam. During the exam, your caregiver may:  Look in your nose for signs of abnormal growths in your nostrils (nasal polyps).  Tap over the affected sinus to check for signs of infection.  View the inside of your sinuses (endoscopy) with a special imaging device with a light attached (endoscope), which is inserted into your sinuses. If your caregiver suspects that you have chronic sinusitis, one or more of the following tests may be recommended:  Allergy tests.  Nasal culture A sample of mucus is taken from your nose and sent to a lab and screened for bacteria.  Nasal cytology A sample of mucus is taken from your nose and examined by your caregiver to determine if your sinusitis is related to an allergy. TREATMENT  Most cases of acute sinusitis are related to a viral infection and will resolve on their own within 10 days. Sometimes medicines are prescribed to help relieve symptoms (pain medicine, decongestants, nasal steroid sprays, or saline sprays).  However, for sinusitis related to a bacterial infection, your caregiver will prescribe antibiotic medicines. These are medicines that will help kill the bacteria causing the infection.  Rarely, sinusitis is caused by a fungal infection. In theses cases, your caregiver will prescribe antifungal medicine. For some cases of chronic sinusitis, surgery is needed. Generally, these are cases in which sinusitis recurs more than 3 times per year, despite other treatments. HOME CARE INSTRUCTIONS   Drink plenty of water. Water helps thin the mucus so your sinuses can drain more easily.  Use a humidifier.  Inhale steam 3 to 4 times a day (for example, sit in the bathroom with the shower running).  Apply a warm, moist washcloth to your face 3 to 4 times a day, or as directed by your caregiver.  Use saline nasal sprays to help moisten and clean your sinuses.  Take over-the-counter or  prescription medicines for pain, discomfort, or fever only as directed by your caregiver. SEEK IMMEDIATE MEDICAL CARE IF:  You have increasing pain or severe headaches.  You have nausea, vomiting, or drowsiness.  You have swelling around your face.  You have vision problems.  You have a stiff neck.  You have difficulty breathing. MAKE SURE YOU:   Understand these instructions.  Will watch your condition.  Will get help right away if you are not doing well or get worse. Document Released: 05/10/2005 Document Revised: 08/02/2011 Document Reviewed: 05/25/2011 [facility] Patient Information 2013 [place], [place].

## 2012-08-09 ENCOUNTER — Ambulatory Visit (HOSPITAL_COMMUNITY)
Admission: RE | Admit: 2012-08-09 | Discharge: 2012-08-09 | Disposition: A | Discharge status: Home / Self Care | Payer: Medicare Other | Source: Ambulatory Visit | Attending: Gynecology | Admitting: Gynecology

## 2012-08-09 DIAGNOSIS — Z1231 Encounter for screening mammogram for malignant neoplasm of breast: Secondary | ICD-10-CM | POA: Diagnosis not present

## 2012-08-21 ENCOUNTER — Encounter: Payer: Self-pay | Admitting: Family Medicine

## 2012-08-21 ENCOUNTER — Ambulatory Visit (INDEPENDENT_AMBULATORY_CARE_PROVIDER_SITE_OTHER): Payer: Medicare Other | Admitting: Family Medicine

## 2012-08-21 VITALS — BP 120/80 | HR 104 | Temp 99.4°F | Ht 66.0 in | Wt 196.0 lb

## 2012-08-21 DIAGNOSIS — E785 Hyperlipidemia, unspecified: Secondary | ICD-10-CM

## 2012-08-21 DIAGNOSIS — J309 Allergic rhinitis, unspecified: Secondary | ICD-10-CM | POA: Insufficient documentation

## 2012-08-21 DIAGNOSIS — I10 Essential (primary) hypertension: Secondary | ICD-10-CM | POA: Diagnosis not present

## 2012-08-21 DIAGNOSIS — M545 Low back pain, unspecified: Secondary | ICD-10-CM

## 2012-08-21 DIAGNOSIS — R7309 Other abnormal glucose: Secondary | ICD-10-CM

## 2012-08-21 DIAGNOSIS — R739 Hyperglycemia, unspecified: Secondary | ICD-10-CM

## 2012-08-21 DIAGNOSIS — F419 Anxiety disorder, unspecified: Secondary | ICD-10-CM | POA: Insufficient documentation

## 2012-08-21 DIAGNOSIS — J45909 Unspecified asthma, uncomplicated: Secondary | ICD-10-CM

## 2012-08-21 DIAGNOSIS — F411 Generalized anxiety disorder: Secondary | ICD-10-CM

## 2012-08-21 DIAGNOSIS — G8929 Other chronic pain: Secondary | ICD-10-CM

## 2012-08-21 DIAGNOSIS — N951 Menopausal and female climacteric states: Secondary | ICD-10-CM

## 2012-08-21 DIAGNOSIS — N390 Urinary tract infection, site not specified: Secondary | ICD-10-CM

## 2012-08-21 LAB — LIPID PANEL
Cholesterol: 194 mg/dL (ref 0–200)
HDL: 100 mg/dL (ref >39.00)
LDL Cholesterol: 78 mg/dL (ref 0–99)
Triglycerides: 78 mg/dL (ref 0.0–149.0)
VLDL: 15.6 mg/dL (ref 0.0–40.0)

## 2012-08-21 LAB — BASIC METABOLIC PANEL
BUN: 15 mg/dL (ref 6–23)
CO2: 30 mEq/L (ref 19–32)
Calcium: 9.2 mg/dL (ref 8.4–10.5)
GFR: 95.65 mL/min (ref >60.00)
Glucose, Bld: 121 mg/dL — ABNORMAL HIGH (ref 70–99)
Sodium: 136 mEq/L (ref 135–145)

## 2012-08-21 MED ORDER — TRAMADOL-ACETAMINOPHEN 37.5-325 MG PO TABS: 1.0000 | ORAL_TABLET | ORAL | Status: DC | PRN

## 2012-08-21 MED ORDER — ATORVASTATIN CALCIUM 20 MG PO TABS: 20.0000 mg | ORAL_TABLET | Freq: Every day | ORAL | Status: DC

## 2012-08-21 MED ORDER — FLUCONAZOLE 150 MG PO TABS: ORAL_TABLET | ORAL | Status: DC

## 2012-08-21 MED ORDER — LANSOPRAZOLE 30 MG PO CPDR: 30.0000 mg | DELAYED_RELEASE_CAPSULE | Freq: Every day | ORAL | Status: DC

## 2012-08-21 MED ORDER — OLMESARTAN-AMLODIPINE-HCTZ 40-5-12.5 MG PO TABS: ORAL_TABLET | ORAL | Status: DC

## 2012-08-21 MED ORDER — DIAZEPAM 5 MG PO TABS: ORAL_TABLET | ORAL | Status: DC

## 2012-08-21 MED ORDER — CIPROFLOXACIN HCL 250 MG PO TABS: 250.0000 mg | ORAL_TABLET | ORAL | Status: DC | PRN

## 2012-08-21 NOTE — Progress Notes (Signed)
Chief Complaint  Patient presents with  . Establish Care    HPI:  Kristin Merritt is here to establish care. Recently moved her from [place]. Followed by Dr, Lily Peer in gyn for HRT.  Last PCP and physical:  Has the following chronic problems and concerns today:  Patient Active Problem List  Diagnosis  . Symptoms, such as flushing, sleeplessness, headache, lack of concentration, associated with the menopause  . Postmenopausal HRT (hormone replacement therapy) - followed by Dr. Lily Peer in gyn  . Asthma, chronic  . Essential hypertension, benign  . Hyperlipemia  . Anxiety  . Chronic low back pain  . Allergic rhinitis  . Recurrent UTI after sex, uses prophylactic cipro with diflucan for prevention yeast infection   Asthma/AR: -takes advair and alb prn - only a few times per year usually in the spring when pollen is bad -has never been hospitalized for asthma -has not needed prednisone for asthma -does not take anything for allergies, reports has had allergy testing and does have allergies - wants to see allergist  HTN/HLD/Obesity: -takes ASA, lipitor, olmesartan-amlodipine-hctz -has started back on exercising and eating healthy  Hot Flashes/perimenopause: -s/p hysterectomy for menorrhagia -followed by gyn on HRT  Constipation/GERD: -uses colace, fiber -takes prevacid daily for acid reflux  Anxiety: -very rarely has to use valium - 4x per year for panic attacks -takes ambien 3 times per week for trouble sleeping due to worry -no SI  Chronic low back pain: -hx of DDD and hx of discectomy -would be interested in seeing PMR -uses Ultracet 5 times per month  Health Maintenance: -sees Dr. Lily Peer for gyn and HRT - just had mammo  ROS: See pertinent positives and negatives per HPI.  Past Medical History  Diagnosis Date  . Allergy   . Anxiety   . Hypertension   . Lichen sclerosus et atrophicus   . 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 Postmenopausal HRT (hormone replacement therapy) - followed by Dr. Lily Peer in gyn 07/11/2012  . Asthma   . Uterine cancer   . Depression   . High cholesterol   . UTI (urinary tract infection)     Family History  Problem Relation Age of Onset  . Cancer Mother     LUNG   . Breast cancer Mother     History   Social History  . Marital Status: Widowed    Spouse Name: N/A    Number of Children: N/A  . Years of Education: N/A   Social History Main Topics  . Smoking status: Former Smoker -- 1.00 packs/day for 20 years    Types: Cigarettes    Quit date: 06/05/2012  . Smokeless tobacco: Former Neurosurgeon  . Alcohol Use: No  . Drug Use: No  . Sexually Active: Yes    Birth Control/ Protection: Condom   Other Topics Concern  . None   Social History Narrative   Work or School: retired Surveyor, mining Situation: lives alone       Spiritual Beliefs: Christain      Lifestyle: getting back into exercising and eating well             Current outpatient prescriptions:aspirin 81 MG tablet, Take 81 mg by mouth daily., Disp: , Rfl: ;  atorvastatin (LIPITOR) 20 MG tablet, Take 1 tablet (20 mg total) by mouth daily., Disp: 90 tablet, Rfl: 3;  docusate sodium (COLACE) 50 MG capsule, Take by  mouth 2 (two) times daily. Over the counter Equate stool softener, Disp: , Rfl: ;  estradiol (ESTRACE) 1 MG tablet, Take 1 tablet (1 mg total) by mouth daily., Disp: 30 tablet, Rfl: 11 lansoprazole (PREVACID) 30 MG capsule, Take 1 capsule (30 mg total) by mouth daily., Disp: 90 capsule, Rfl: 3;  Olmesartan-Amlodipine-HCTZ 40-5-12.5 MG TABS, One by mouth daily, Disp: 90 tablet, Rfl: 3;  traMADol-acetaminophen (ULTRACET) 37.5-325 MG per tablet, Take 1 tablet by mouth as needed (use very sparingly and not more then once per day for back pain)., Disp: 30 tablet, Rfl: 0 zolpidem (AMBIEN) 10 MG tablet, Take 10 mg by mouth at bedtime as needed for sleep., Disp: , Rfl: ;  albuterol  (PROVENTIL HFA;VENTOLIN HFA) 108 (90 BASE) MCG/ACT inhaler, Inhale 2 puffs into the lungs every 6 (six) hours as needed., Disp: , Rfl: ;  ciprofloxacin (CIPRO) 250 MG tablet, Take 1 tablet (250 mg total) by mouth as needed. Take after intercourse, Disp: 5 tablet, Rfl: 1 diazepam (VALIUM) 5 MG tablet, Use very sparingly for panic attack - not more then once in a day., Disp: 10 tablet, Rfl: 0;  fluconazole (DIFLUCAN) 150 MG tablet, Use only if needed for yeast infection after antibiotic., Disp: 5 tablet, Rfl: 0;  Fluticasone-Salmeterol (ADVAIR) 250-50 MCG/DOSE AEPB, Inhale 1 puff into the lungs as needed., Disp: , Rfl:   EXAM:  Filed Vitals:   08/21/12 1119  BP: 120/80  Pulse: 104  Temp: 99.4 F (37.4 C)    Body mass index is 31.65 kg/(m^2).  GENERAL: vitals reviewed and listed above, alert, oriented, appears well hydrated and in no acute distress  HEENT: atraumatic, conjunttiva clear, allergic shiners no obvious abnormalities on inspection of external nose and ears, normal appearance of ear canals and TMs, clear nasal congestion with pale boggy turbinates, mild post oropharyngeal erythema with PND, no tonsillar edema or exudate, no sinus TTP  NECK: no obvious masses on inspection  LUNGS: clear to auscultation bilaterally, no wheezes, rales or rhonchi, good air movement  CV: HRRR, no peripheral edema  MS: moves all extremities without noticeable abnormality  PSYCH: pleasant and cooperative, no obvious depression or anxiety  ASSESSMENT AND PLAN:  Discussed the following assessment and plan:  Asthma, chronic - Plan: Ambulatory referral to Allergy  Essential hypertension, benign - Plan: Basic metabolic panel  Hyperlipemia - Plan: Lipid Panel  Chronic low back pain - Plan: Ambulatory referral to Physical Medicine Rehab, traMADol-acetaminophen (ULTRACET) 37.5-325 MG per tablet  Allergic rhinitis - Plan: Ambulatory referral to Allergy  Hyperglycemia - Plan: Hemoglobin A1c  Hot  flash, menopausal - Plan: atorvastatin (LIPITOR) 20 MG tablet, lansoprazole (PREVACID) 30 MG capsule, Olmesartan-Amlodipine-HCTZ 40-5-12.5 MG TABS, traMADol-acetaminophen (ULTRACET) 37.5-325 MG per tablet, ciprofloxacin (CIPRO) 250 MG tablet  Anxiety - Plan: diazepam (VALIUM) 5 MG tablet  UTI (urinary tract infection) - Plan: ciprofloxacin (CIPRO) 250 MG tablet, fluconazole (DIFLUCAN) 150 MG tablet  Recurrent UTI after sex, uses prophylactic cipro with diflucan for prevention yeast infection -We reviewed the PMH, PSH, FH, SH, Meds and Allergies. -Advised sleep hygeine, exercise and counseling for anxiety /insomnia and advised will not be refilling ambien -will refill valium for very occ use for panic - 10 per year, advised of risks and not to use with other sedative medications -for chronic pain, placed referral per her request to PMR for non-surgical options for DDD, advised regular exercise and will refill Ultracet to use sparingly on worst days (30 per 6 months) - warned of risks and not to  use with other sedative medications or alcohol -very mild intermittent asthma and allergies - per her request referred to allergist as she is interested in allergy shots -advised regular exercise, healthy diet and weight loss -labs per orders -refilled medicaitons -women's health followed by gyn - advised her to discuss her hx of vulvar disorder and FH breast cancer with her gynecologist -follow up in 3-4 months ->45 minutes spent face to face with this patient  -Patient advised to return or notify a doctor immediately if symptoms worsen or persist or new concerns arise.  Patient Instructions  -We have ordered labs or studies at this visit. It can take up to 1-2 weeks for results and processing. We will contact you with instructions IF your results are abnormal. Normal results will be released to your [facility]. If you have not heard from [place] or can not find your results in [facility] in 2 weeks please contact  our office.  -We placed a referral for you to the allergist as discussed. It usually takes about 1-2 weeks to process and schedule this referral. If you have not heard from [place] regarding this appointment in 2 weeks please contact our office. You can try benadryl at night or allegra or Claritin for your allergies.  -We placed a referral for you as discussed to the back doctor. It usually takes about 1-2 weeks to process and schedule this referral. If you have not heard from [place] regarding this appointment in 2 weeks please contact our office.  FOR SLEEP: -keep bedroom cool, dark and quiet and reserve bed only for sleep and sex -if can not fall asleep in 20 minutes get up and journal and do light activity in another room then try to go back to sleep, repeat until able to sleep -exercise daily -avoid caffeine and alcohol -stop the [place] -get counseling if continued problems  I will be refilling the diazepam for only 5 per year to use on rare occassions I will be refilling the Ultracet for 30 every 6 months to use sparingly - do not use with the diazepam  -PLEASE SIGN UP FOR MYCHART TODAY   We recommend the following healthy lifestyle measures: - eat a healthy diet consisting of lots of vegetables, fruits, beans, nuts, seeds, healthy meats such as white chicken and fish and whole grains.  - avoid fried foods, fast food, processed foods, sodas, red meet and other fattening foods.  - get a least 150 minutes of aerobic exercise per week.   Follow up in: 3-4 months      Kristin Merritt R.

## 2012-08-21 NOTE — Patient Instructions (Signed)
-  We have ordered labs or studies at this visit. It can take up to 1-2 weeks for results and processing. We will contact you with instructions IF your results are abnormal. Normal results will be released to your [facility]. If you have not heard from [place] or can not find your results in [facility] in 2 weeks please contact our office.  -We placed a referral for you to the allergist as discussed. It usually takes about 1-2 weeks to process and schedule this referral. If you have not heard from [place] regarding this appointment in 2 weeks please contact our office. You can try benadryl at night or allegra or Claritin for your allergies.  -We placed a referral for you as discussed to the back doctor. It usually takes about 1-2 weeks to process and schedule this referral. If you have not heard from [place] regarding this appointment in 2 weeks please contact our office.  FOR SLEEP: -keep bedroom cool, dark and quiet and reserve bed only for sleep and sex -if can not fall asleep in 20 minutes get up and journal and do light activity in another room then try to go back to sleep, repeat until able to sleep -exercise daily -avoid caffeine and alcohol -stop the [place] -get counseling if continued problems  I will be refilling the diazepam for only 5 per year to use on rare occassions I will be refilling the Ultracet for 30 every 6 months to use sparingly - do not use with the diazepam  -PLEASE SIGN UP FOR MYCHART TODAY   We recommend the following healthy lifestyle measures: - eat a healthy diet consisting of lots of vegetables, fruits, beans, nuts, seeds, healthy meats such as white chicken and fish and whole grains.  - avoid fried foods, fast food, processed foods, sodas, red meet and other fattening foods.  - get a least 150 minutes of aerobic exercise per week.   Follow up in: 3-4 months

## 2012-08-22 ENCOUNTER — Telehealth: Payer: Self-pay | Admitting: Family Medicine

## 2012-08-22 NOTE — Telephone Encounter (Signed)
Called and spoke with pt and pt is aware.  

## 2012-08-22 NOTE — Telephone Encounter (Signed)
Labs look pretty good. Borderline blood sugar. It will be very important to get regular exercise and eat healthy to prevent diabetes. Follow up in 3-4 months - please remind of no show late cancel policy.

## 2012-09-12 DIAGNOSIS — R059 Cough, unspecified: Secondary | ICD-10-CM | POA: Diagnosis not present

## 2012-09-12 DIAGNOSIS — J309 Allergic rhinitis, unspecified: Secondary | ICD-10-CM | POA: Diagnosis not present

## 2012-09-12 DIAGNOSIS — R05 Cough: Secondary | ICD-10-CM | POA: Diagnosis not present

## 2012-09-12 DIAGNOSIS — H1045 Other chronic allergic conjunctivitis: Secondary | ICD-10-CM | POA: Diagnosis not present

## 2012-09-12 DIAGNOSIS — Z91018 Allergy to other foods: Secondary | ICD-10-CM | POA: Diagnosis not present

## 2012-09-13 DIAGNOSIS — J309 Allergic rhinitis, unspecified: Secondary | ICD-10-CM | POA: Diagnosis not present

## 2012-09-15 ENCOUNTER — Encounter: Payer: Self-pay | Admitting: Family Medicine

## 2012-09-15 NOTE — Progress Notes (Signed)
Received office visit note from [facility] from 09/12/12 with Dr. [place] Callas. Dx. Allergy to dust mites, cough, started levoceterizine 5 mg daily, Astepro, singulair 10mg  daily,and given epipen and albuterol.

## 2012-09-18 ENCOUNTER — Ambulatory Visit (INDEPENDENT_AMBULATORY_CARE_PROVIDER_SITE_OTHER): Payer: Medicare Other | Admitting: Family Medicine

## 2012-09-18 VITALS — BP 130/70 | HR 76 | Temp 98.1°F | Resp 18 | Ht 66.0 in | Wt 197.0 lb

## 2012-09-18 DIAGNOSIS — Z113 Encounter for screening for infections with a predominantly sexual mode of transmission: Secondary | ICD-10-CM

## 2012-09-18 DIAGNOSIS — N898 Other specified noninflammatory disorders of vagina: Secondary | ICD-10-CM

## 2012-09-18 DIAGNOSIS — L293 Anogenital pruritus, unspecified: Secondary | ICD-10-CM | POA: Diagnosis not present

## 2012-09-18 LAB — POCT WET PREP WITH KOH
Clue Cells Wet Prep HPF POC: 100
Trichomonas, UA: NEGATIVE

## 2012-09-18 MED ORDER — METRONIDAZOLE 0.75 % VA GEL: VAGINAL | Status: DC

## 2012-09-18 NOTE — Progress Notes (Signed)
[facility] [address] [place] [address] [phone]  Date:  09/18/2012   Name:  Kristin Merritt   DOB:  [birth year]   MRN:  [id number]  PCP:  Terressa Koyanagi., DO    Chief Complaint: Vaginal Itching   History of Present Illness:  Kristin Merritt is a 52 y.o. very pleasant female patient who presents with the following:  She was here 06/26/2012 with vaginal discharge.  She was treated with flagyl and diflucan for possible yeast or BV (wet prep was non- specific).  She was then referred to Dr. Lily Peer who has started her on HRT for menopausal symptoms.    She is here today with vaginal burning, and some dryness.  She is having some intermittent discharge again.    She is fairly new to town.  She would like to establish with a PCP.   She has been told she might need surgery for her back problems in the past.  She has DDD.   This is why she is on disability.  She was offended when the doctor who Dr. Clelia Croft recommended to be her new PCP suggested that it might be beneficial for her to be working as opposed to being on disability for her back.  She would like to see another doctor who is an MD as opposed to a DO.   She has had a hysterectomy in the past which she stated was due to cervical cancer.    Patient Active Problem List   Diagnosis Date Noted  . Asthma, chronic 08/21/2012  . Essential hypertension, benign 08/21/2012  . Hyperlipemia 08/21/2012  . Anxiety 08/21/2012  . Chronic low back pain 08/21/2012  . Allergic rhinitis 08/21/2012  . Recurrent UTI after sex, uses prophylactic cipro with diflucan for prevention yeast infection 08/21/2012  . Symptoms, such as flushing, sleeplessness, headache, lack of concentration, associated with the menopause 07/11/2012  . Postmenopausal HRT (hormone replacement therapy) - followed by Dr. Lily Peer in gyn 07/11/2012    Past Medical History  Diagnosis Date  . Allergy   . Anxiety   . Hypertension   . Lichen sclerosus et  atrophicus   . 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 Postmenopausal HRT (hormone replacement therapy) - followed by Dr. Lily Peer in gyn 07/11/2012  . Asthma   . Uterine cancer   . Depression   . High cholesterol   . UTI (urinary tract infection)     Past Surgical History  Procedure Laterality Date  . Eye surgery    . Abdominal hysterectomy    . Spine surgery      History  Substance Use Topics  . Smoking status: Former Smoker -- 1.00 packs/day for 20 years    Types: Cigarettes    Quit date: 06/05/2012  . Smokeless tobacco: Former Neurosurgeon  . Alcohol Use: No    Family History  Problem Relation Age of Onset  . Cancer Mother     LUNG   . Breast cancer Mother     No Known Allergies  Medication list has been reviewed and updated.  Current Outpatient Prescriptions on File Prior to Visit  Medication Sig Dispense Refill  . albuterol (PROVENTIL HFA;VENTOLIN HFA) 108 (90 BASE) MCG/ACT inhaler Inhale 2 puffs into the lungs every 6 (six) hours as needed.      Marland Kitchen aspirin 81 MG tablet Take 81 mg by mouth daily.      Marland Kitchen atorvastatin (LIPITOR) 20  MG tablet Take 1 tablet (20 mg total) by mouth daily.  90 tablet  3  . ciprofloxacin (CIPRO) 250 MG tablet Take 1 tablet (250 mg total) by mouth as needed. Take after intercourse  5 tablet  1  . diazepam (VALIUM) 5 MG tablet Use very sparingly for panic attack - not more then once in a day.  10 tablet  0  . docusate sodium (COLACE) 50 MG capsule Take by mouth 2 (two) times daily. Over the counter Equate stool softener      . estradiol (ESTRACE) 1 MG tablet Take 1 tablet (1 mg total) by mouth daily.  30 tablet  11  . fluconazole (DIFLUCAN) 150 MG tablet Use only if needed for yeast infection after antibiotic.  5 tablet  0  . Fluticasone-Salmeterol (ADVAIR) 250-50 MCG/DOSE AEPB Inhale 1 puff into the lungs as needed.      . lansoprazole (PREVACID) 30 MG capsule Take 1 capsule (30 mg total) by mouth daily.  90 capsule   3  . traMADol-acetaminophen (ULTRACET) 37.5-325 MG per tablet Take 1 tablet by mouth as needed (use very sparingly and not more then once per day for back pain).  30 tablet  0  . zolpidem (AMBIEN) 10 MG tablet Take 10 mg by mouth at bedtime as needed for sleep.      . Olmesartan-Amlodipine-HCTZ 40-5-12.5 MG TABS One by mouth daily  90 tablet  3   No current facility-administered medications on file prior to visit.    Review of Systems:  As per HPI- otherwise negative.   Physical Examination: Filed Vitals:   09/18/12 1240  BP: 130/70  Pulse: 76  Temp: 98.1 F (36.7 C)  Resp: 18   Filed Vitals:   09/18/12 1240  Height: 5\' 6"  (1.676 m)  Weight: 197 lb (89.359 kg)   Body mass index is 31.81 kg/(m^2). Ideal Body Weight: Weight in (lb) to have BMI = 25: 154.6  GEN: WDWN, NAD, Non-toxic, A & O x 3, obese HEENT: Atraumatic, Normocephalic. Neck supple. No masses, No LAD. Ears and Nose: No external deformity. CV: RRR, No M/G/R. No JVD. No thrill. No extra heart sounds. PULM: CTA B, no wheezes, crackles, rhonchi. No retractions. No resp. distress. No accessory muscle use. ABD: S, NT, ND EXTR: No c/c/e NEURO Normal gait.  PSYCH: Normally interactive. Conversant. Not depressed or anxious appearing.  Calm demeanor.  GU: slightly fishy odor, small amount of vaginal discharge.  Otherwise normal   Results for orders placed in visit on 09/18/12  POCT WET PREP WITH KOH      Result Value Range   Trichomonas, UA Negative     Clue Cells Wet Prep HPF POC 100%     Epithelial Wet Prep HPF POC 10-20     Yeast Wet Prep HPF POC neg     Bacteria Wet Prep HPF POC 4+     RBC Wet Prep HPF POC 0-1     WBC Wet Prep HPF POC 0-1     KOH Prep POC Negative       Assessment and Plan: Vaginal discharge - Plan: POCT Wet Prep with KOH, metroNIDAZOLE (METROGEL VAGINAL) 0.75 % vaginal gel  Vaginal itching  Routine screening for STI (sexually transmitted infection) - Plan: GC/Chlamydia Probe  Amp  Treat for BV with metrogel today. Let me know if not better in then ext few days.  Await genprobe and follow- up.  Sent note to Dr. Lily Peer regarding the reason for her hysterectomy- it  seems she had endorsed a history of hysterectomy for menorrhagia in the past.    Counseled Jalaysha that DO's are physicians and that they are well- qualified to care for medical problems and especially for MSK issues, and that indeed working and activity are usually good for patients with back pain.  However, she would still like to find a new doctor.  Asked her to call [place] and ask to see a different physician if she chooses.    Signed Abbe Amsterdam, MD

## 2012-09-19 ENCOUNTER — Telehealth: Payer: Self-pay | Admitting: *Deleted

## 2012-09-19 LAB — GC/CHLAMYDIA PROBE AMP
CT Probe RNA: NEGATIVE
GC Probe RNA: NEGATIVE

## 2012-09-19 NOTE — Telephone Encounter (Signed)
Message copied by Aura Camps on Tue Sep 19, 2012 10:07 AM ------      Message from: Ok Edwards      Created: Tue Sep 19, 2012  9:46 AM       Runell Gess please contact patient and inform her that we are confused in reference to her hysterectomy in [place]. Did she had cervical, uterine, or ovarian cancer in the past? Also did she receive any chemotherapy or radiation treatment? Can she obtain surgical report from previous provider or hospital to get clarification which is important. Thanks JF ------

## 2012-09-19 NOTE — Telephone Encounter (Signed)
Pt said she will see what she can find out and call me back.

## 2012-09-29 NOTE — Telephone Encounter (Signed)
Pt said that her surgery was years ago she believes it was uterine. Pt she never received chemotherapy. The place where she had surgery has been renamed several times.

## 2012-10-09 DIAGNOSIS — J309 Allergic rhinitis, unspecified: Secondary | ICD-10-CM | POA: Diagnosis not present

## 2012-10-13 DIAGNOSIS — J309 Allergic rhinitis, unspecified: Secondary | ICD-10-CM | POA: Diagnosis not present

## 2012-10-17 DIAGNOSIS — J309 Allergic rhinitis, unspecified: Secondary | ICD-10-CM | POA: Diagnosis not present

## 2012-10-20 DIAGNOSIS — J309 Allergic rhinitis, unspecified: Secondary | ICD-10-CM | POA: Diagnosis not present

## 2012-10-23 ENCOUNTER — Telehealth: Payer: Self-pay | Admitting: Family Medicine

## 2012-10-23 DIAGNOSIS — J309 Allergic rhinitis, unspecified: Secondary | ICD-10-CM | POA: Diagnosis not present

## 2012-10-23 NOTE — Telephone Encounter (Signed)
Ok if that is ok with Dr. Selena Batten

## 2012-10-23 NOTE — Telephone Encounter (Signed)
Pt aware/kh 

## 2012-10-23 NOTE — Telephone Encounter (Signed)
Pt would like to switch to [employer] from Dr Selena Batten. Is that OK with you Dr Selena Batten? [name] ,pt would like to switch to you from Dr Selena Batten. Is that ok w/ you?

## 2012-10-23 NOTE — Telephone Encounter (Signed)
Ok with me 

## 2012-10-25 DIAGNOSIS — J309 Allergic rhinitis, unspecified: Secondary | ICD-10-CM | POA: Diagnosis not present

## 2012-10-27 DIAGNOSIS — J309 Allergic rhinitis, unspecified: Secondary | ICD-10-CM | POA: Diagnosis not present

## 2012-10-30 DIAGNOSIS — J309 Allergic rhinitis, unspecified: Secondary | ICD-10-CM | POA: Diagnosis not present

## 2012-11-01 DIAGNOSIS — J309 Allergic rhinitis, unspecified: Secondary | ICD-10-CM | POA: Diagnosis not present

## 2012-11-03 DIAGNOSIS — J309 Allergic rhinitis, unspecified: Secondary | ICD-10-CM | POA: Diagnosis not present

## 2012-11-07 DIAGNOSIS — J309 Allergic rhinitis, unspecified: Secondary | ICD-10-CM | POA: Diagnosis not present

## 2012-11-10 DIAGNOSIS — J309 Allergic rhinitis, unspecified: Secondary | ICD-10-CM | POA: Diagnosis not present

## 2012-11-14 DIAGNOSIS — J309 Allergic rhinitis, unspecified: Secondary | ICD-10-CM | POA: Diagnosis not present

## 2012-11-16 DIAGNOSIS — J309 Allergic rhinitis, unspecified: Secondary | ICD-10-CM | POA: Diagnosis not present

## 2012-11-21 DIAGNOSIS — J309 Allergic rhinitis, unspecified: Secondary | ICD-10-CM | POA: Diagnosis not present

## 2012-11-23 DIAGNOSIS — J309 Allergic rhinitis, unspecified: Secondary | ICD-10-CM | POA: Diagnosis not present

## 2012-11-27 ENCOUNTER — Ambulatory Visit: Payer: Medicare Other | Admitting: Family Medicine

## 2012-11-28 DIAGNOSIS — J309 Allergic rhinitis, unspecified: Secondary | ICD-10-CM | POA: Diagnosis not present

## 2012-12-01 DIAGNOSIS — J309 Allergic rhinitis, unspecified: Secondary | ICD-10-CM | POA: Diagnosis not present

## 2012-12-05 DIAGNOSIS — J309 Allergic rhinitis, unspecified: Secondary | ICD-10-CM | POA: Diagnosis not present

## 2012-12-07 DIAGNOSIS — J309 Allergic rhinitis, unspecified: Secondary | ICD-10-CM | POA: Diagnosis not present

## 2012-12-12 DIAGNOSIS — J309 Allergic rhinitis, unspecified: Secondary | ICD-10-CM | POA: Diagnosis not present

## 2012-12-14 DIAGNOSIS — J309 Allergic rhinitis, unspecified: Secondary | ICD-10-CM | POA: Diagnosis not present

## 2012-12-18 DIAGNOSIS — J309 Allergic rhinitis, unspecified: Secondary | ICD-10-CM | POA: Diagnosis not present

## 2012-12-21 DIAGNOSIS — J309 Allergic rhinitis, unspecified: Secondary | ICD-10-CM | POA: Diagnosis not present

## 2012-12-26 DIAGNOSIS — J309 Allergic rhinitis, unspecified: Secondary | ICD-10-CM | POA: Diagnosis not present

## 2013-01-01 ENCOUNTER — Telehealth: Payer: Self-pay | Admitting: Family

## 2013-01-01 NOTE — Telephone Encounter (Signed)
appt scheduled

## 2013-01-01 NOTE — Telephone Encounter (Signed)
Ok to schedule acute appointment for this week

## 2013-01-01 NOTE — Telephone Encounter (Signed)
PT is calling in and requested an acute visit for back pain. You have yet to fully establish with this pt, and your next available new pt appt is next Monday 8/18. She would prefer not to wait that long. Please assist.

## 2013-01-02 ENCOUNTER — Ambulatory Visit (INDEPENDENT_AMBULATORY_CARE_PROVIDER_SITE_OTHER): Payer: Medicare Other | Admitting: Family

## 2013-01-02 ENCOUNTER — Encounter: Payer: Self-pay | Admitting: Family

## 2013-01-02 VITALS — BP 120/70 | HR 78 | Wt 212.0 lb

## 2013-01-02 DIAGNOSIS — M549 Dorsalgia, unspecified: Secondary | ICD-10-CM | POA: Diagnosis not present

## 2013-01-02 DIAGNOSIS — E785 Hyperlipidemia, unspecified: Secondary | ICD-10-CM

## 2013-01-02 DIAGNOSIS — J309 Allergic rhinitis, unspecified: Secondary | ICD-10-CM

## 2013-01-02 DIAGNOSIS — I10 Essential (primary) hypertension: Secondary | ICD-10-CM

## 2013-01-02 DIAGNOSIS — G8929 Other chronic pain: Secondary | ICD-10-CM

## 2013-01-02 DIAGNOSIS — R635 Abnormal weight gain: Secondary | ICD-10-CM | POA: Diagnosis not present

## 2013-01-02 MED ORDER — PREDNISONE 20 MG PO TABS: ORAL_TABLET | ORAL | Status: DC

## 2013-01-02 MED ORDER — OXYCODONE-ACETAMINOPHEN 5-325 MG PO TABS: 1.0000 | ORAL_TABLET | Freq: Three times a day (TID) | ORAL | Status: DC | PRN

## 2013-01-02 NOTE — Patient Instructions (Addendum)
Back Exercises  Back exercises help treat and prevent back injuries. The goal is to increase your strength in your belly (abdominal) and back muscles. These exercises can also help with flexibility. Start these exercises when told by your doctor.  HOME CARE  Back exercises include:  Pelvic Tilt.  · Lie on your back with your knees bent. Tilt your pelvis until the lower part of your back is against the floor. Hold this position 5 to 10 sec. Repeat this exercise 5 to 10 times.  Knee to Chest.  · Pull 1 knee up against your chest and hold for 20 to 30 seconds. Repeat this with the other knee. This may be done with the other leg straight or bent, whichever feels better. Then, pull both knees up against your chest.  Sit-Ups or Curl-Ups.  · Bend your knees 90 degrees. Start with tilting your pelvis, and do a partial, slow sit-up. Only lift your upper half 30 to 45 degrees off the floor. Take at least 2 to 3 seonds for each sit-up. Do not do sit-ups with your knees out straight. If partial sit-ups are difficult, simply do the above but with only tightening your belly (abdominal) muscles and holding it as told.  Hip-Lift.  · Lie on your back with your knees flexed 90 degrees. Push down with your feet and shoulders as you raise your hips 2 inches off the floor. Hold for 10 seconds, repeat 5 to 10 times.  Back Arches.  · Lie on your stomach. Prop yourself up on bent elbows. Slowly press on your hands, causing an arch in your low back. Repeat 3 to 5 times.  Shoulder-Lifts.  · Lie face down with arms beside your body. Keep hips and belly pressed to floor as you slowly lift your head and shoulders off the floor.  Do not overdo your exercises. Be careful in the beginning. Exercises may cause you some mild back discomfort. If the pain lasts for more than 15 minutes, stop the exercises until you see your doctor. Improvement with exercise for back problems is slow.   Document Released: 06/12/2010 Document Revised: 08/02/2011  Document Reviewed: 03/11/2011  [employer]® Patient Information ©2014 [employer].

## 2013-01-02 NOTE — Progress Notes (Signed)
Subjective:    Patient ID: Kristin Merritt, female    DOB: 07-25-1960, 52 y.o.   MRN: [id number]  HPI 52 year old [place] American female, smoker, transferring from Dr. Selena Batten to myself has a history of chronic low back pain, hypertension, hyperlipidemia, anxiety, allergic rhinitis, and insomnia. She has complaints today of a flare of back pain after lifting a case of water. Reports have been flares of back pain approximately twice a year lasting about a week requiring pain medication and muscle relaxants that relieve her symptoms. In the past, she seen neurosurgery and has had surgery to L5. Most recently in about 2008 they have recommended that she had a spinal fusion and she has declined at this time. She has been treating flares if they occur and attempting to avoid back surgery. She rates the pain a 10 out of 10 today, worse with movement. Describes it as sharp and dull. Worse with sitting on her right side. Better with standing. She has gained approximately 20 pounds in the last year.   Review of Systems  Constitutional: Negative.   HENT: Negative.   Respiratory: Negative.   Cardiovascular: Negative.   Gastrointestinal: Negative.   Endocrine: Negative.   Musculoskeletal: Positive for back pain.  Skin: Negative.   Psychiatric/Behavioral: Negative.    Past Medical History  Diagnosis Date  . Allergy   . Anxiety   . Hypertension   . Lichen sclerosus et atrophicus   . 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 Postmenopausal HRT (hormone replacement therapy) - followed by Dr. Lily Peer in gyn 07/11/2012  . Asthma   . Uterine cancer   . Depression   . High cholesterol   . UTI (urinary tract infection)     History   Social History  . Marital Status: Widowed    Spouse Name: N/A    Number of Children: N/A  . Years of Education: N/A   Occupational History  . Not on file.   Social History Main Topics  . Smoking status: Former Smoker -- 1.00 packs/day  for 20 years    Types: Cigarettes    Quit date: 06/05/2012  . Smokeless tobacco: Former Neurosurgeon  . Alcohol Use: No  . Drug Use: No  . Sexually Active: Yes    Birth Control/ Protection: Condom   Other Topics Concern  . Not on file   Social History Narrative   Work or School: retired Surveyor, mining Situation: lives alone       Spiritual Beliefs: Christain      Lifestyle: getting back into exercising and eating well             Past Surgical History  Procedure Laterality Date  . Eye surgery    . Abdominal hysterectomy    . Spine surgery      Family History  Problem Relation Age of Onset  . Cancer Mother     LUNG   . Breast cancer Mother     No Known Allergies  Current Outpatient Prescriptions on File Prior to Visit  Medication Sig Dispense Refill  . aspirin 81 MG tablet Take 81 mg by mouth daily.      Marland Kitchen atorvastatin (LIPITOR) 20 MG tablet Take 1 tablet (20 mg total) by mouth daily.  90 tablet  3  . diazepam (VALIUM) 5 MG tablet Use very sparingly for panic attack - not more then once in a day.  10 tablet  0  .  docusate sodium (COLACE) 50 MG capsule Take by mouth 2 (two) times daily. Over the counter Equate stool softener      . estradiol (ESTRACE) 1 MG tablet Take 1 tablet (1 mg total) by mouth daily.  30 tablet  11  . Fluticasone-Salmeterol (ADVAIR) 250-50 MCG/DOSE AEPB Inhale 1 puff into the lungs as needed.      . lansoprazole (PREVACID) 30 MG capsule Take 1 capsule (30 mg total) by mouth daily.  90 capsule  3  . Olmesartan-Amlodipine-HCTZ 40-5-12.5 MG TABS One by mouth daily  90 tablet  3  . zolpidem (AMBIEN) 10 MG tablet Take 10 mg by mouth at bedtime as needed for sleep.      Marland Kitchen albuterol (PROVENTIL HFA;VENTOLIN HFA) 108 (90 BASE) MCG/ACT inhaler Inhale 2 puffs into the lungs every 6 (six) hours as needed.      . ciprofloxacin (CIPRO) 250 MG tablet Take 1 tablet (250 mg total) by mouth as needed. Take after intercourse  5 tablet  1  . fluconazole  (DIFLUCAN) 150 MG tablet Use only if needed for yeast infection after antibiotic.  5 tablet  0  . metroNIDAZOLE (METROGEL VAGINAL) 0.75 % vaginal gel Use one applicator PV at bedtime for 5 days  70 g  0   No current facility-administered medications on file prior to visit.    BP 120/70  Pulse 78  Wt 212 lb (96.163 kg)  BMI 34.23 kg/m2  SpO2 98%chart    Objective:   Physical Exam  Constitutional: She is oriented to person, place, and time. She appears well-developed and well-nourished.  Neck: Normal range of motion. Neck supple.  Cardiovascular: Normal rate, regular rhythm and normal heart sounds.   Pulmonary/Chest: Effort normal and breath sounds normal.  Abdominal: Soft. Bowel sounds are normal.  Musculoskeletal: Normal range of motion.  Patient has about a 30 of flexion. Rotation approximately 60 bilaterally. Straight leg raise maneuver normal.  Neurological: She is alert and oriented to person, place, and time. She has normal reflexes. She displays normal reflexes. No cranial nerve deficit. Coordination normal.  Skin: Skin is warm and dry.  Psychiatric: She has a normal mood and affect.          Assessment & Plan:   assessment: 1. Chronic low back pain 2. Hypertension 3. Hyperlipidemia 4. Anxiety 5. Insomnia 6. Allergic rhinitis  Plan: Obtain an up-to-date MRI of the lumbar spine. Treat with prednisone 60x3, 40x3, 20x3. Percocet 5/325 as needed for pain. Low back strengthening exercises. Suggest physical therapy. Refer to neurosurgery depending outcome of the MRI. Patient is advised we do not treat chronic pain here and if she requires monthly medication then we will have to refer her to pain management. Followup for complete physical exam as soon as possible with fasting labs.

## 2013-01-05 DIAGNOSIS — J309 Allergic rhinitis, unspecified: Secondary | ICD-10-CM | POA: Diagnosis not present

## 2013-01-09 ENCOUNTER — Encounter: Payer: Self-pay | Admitting: Gynecology

## 2013-01-09 ENCOUNTER — Ambulatory Visit (INDEPENDENT_AMBULATORY_CARE_PROVIDER_SITE_OTHER): Payer: Medicare Other | Admitting: Gynecology

## 2013-01-09 ENCOUNTER — Other Ambulatory Visit (HOSPITAL_COMMUNITY)
Admission: RE | Admit: 2013-01-09 | Discharge: 2013-01-09 | Disposition: A | Discharge status: Home / Self Care | Payer: Medicare Other | Source: Ambulatory Visit | Attending: Gynecology | Admitting: Gynecology

## 2013-01-09 VITALS — BP 120/80 | Ht 66.0 in | Wt 208.0 lb

## 2013-01-09 DIAGNOSIS — Z1272 Encounter for screening for malignant neoplasm of vagina: Secondary | ICD-10-CM | POA: Diagnosis not present

## 2013-01-09 DIAGNOSIS — Z124 Encounter for screening for malignant neoplasm of cervix: Secondary | ICD-10-CM

## 2013-01-09 DIAGNOSIS — Z79890 Hormone replacement therapy (postmenopausal): Secondary | ICD-10-CM | POA: Diagnosis not present

## 2013-01-09 DIAGNOSIS — Z23 Encounter for immunization: Secondary | ICD-10-CM | POA: Diagnosis not present

## 2013-01-09 DIAGNOSIS — N951 Menopausal and female climacteric states: Secondary | ICD-10-CM | POA: Diagnosis not present

## 2013-01-09 DIAGNOSIS — Z1151 Encounter for screening for human papillomavirus (HPV): Secondary | ICD-10-CM | POA: Diagnosis not present

## 2013-01-09 DIAGNOSIS — Z780 Asymptomatic menopausal state: Secondary | ICD-10-CM

## 2013-01-09 MED ORDER — ESTRADIOL 1 MG PO TABS: 1.0000 mg | ORAL_TABLET | Freq: Every day | ORAL | Status: DC

## 2013-01-09 NOTE — Patient Instructions (Signed)

## 2013-01-09 NOTE — Progress Notes (Signed)
Kristin Merritt [birth year] [id number]   History:    52 y.o.  for annual gyn exam who was seen in the office on 07/11/2012 as a new patient and had moved here from new [facility]. She has been complaining over the past month of severe hot flashes and night sweats. She was started on last about 1 mg daily and states it has helped with her hot flashes irritability and mood swings and her vaginal dryness. Patient denies any past history of abnormal Pap smears. Patient several [place] had abdominal hyste[place]. Patient has not had a colonoscopy yet. She had a normal mammogram in March of this year. She has not received her Tdap vaccine. Her PCP will be drawn her blood work next week.   Past medical history,surgical history, family history and social history were all reviewed and documented in the EPIC chart.  Gynecologic History No LMP recorded. Patient has had a hysterectomy. Contraception: status post hysterectomy Last Pap: 2013. Results were: normal Last mammogram: 2014. Results were: normal  Obstetric History OB History  Gravida Para Term Preterm AB SAB TAB Ectopic Multiple Living  1 1        1     # Outcome Date GA Lbr Len/2nd Weight Sex Delivery Anes PTL Lv  1 PAR                ROS: A ROS was performed and pertinent positives and negatives are included in the history.  GENERAL: No fevers or chills. HEENT: No change in vision, no earache, sore throat or sinus congestion. NECK: No pain or stiffness. CARDIOVASCULAR: No chest pain or pressure. No palpitations. PULMONARY: No shortness of breath, cough or wheeze. GASTROINTESTINAL: No abdominal pain, nausea, vomiting or diarrhea, melena or bright red blood per rectum. GENITOURINARY: No urinary frequency, urgency, hesitancy or dysuria. MUSCULOSKELETAL: No joint or muscle pain, no back pain, no recent trauma. DERMATOLOGIC: No rash, no itching, no lesions. ENDOCRINE: No polyuria, polydipsia, no heat or cold intolerance. No  recent change in weight. HEMATOLOGICAL: No anemia or easy bruising or bleeding. NEUROLOGIC: No headache, seizures, numbness, tingling or weakness. PSYCHIATRIC: No depression, no loss of interest in normal activity or change in sleep pattern.     Exam: chaperone present  BP 120/80  Ht 5\' 6"  (1.676 m)  Wt 208 lb (94.348 kg)  BMI 33.59 kg/m2  Body mass index is 33.59 kg/(m^2).  General appearance : Well developed well nourished female. No acute distress HEENT: Neck supple, trachea midline, no carotid bruits, no thyroidmegaly Lungs: Clear to auscultation, no rhonchi or wheezes, or rib retractions  Heart: Regular rate and rhythm, no murmurs or gallops Breast:Examined in sitting and supine position were symmetrical in appearance, no palpable masses or tenderness,  no skin retraction, no nipple inversion, no nipple discharge, no skin discoloration, no axillary or supraclavicular lymphadenopathy Abdomen: no palpable masses or tenderness, no rebound or guarding Extremities: no edema or skin discoloration or tenderness  Pelvic:  Bartholin, Urethra, Skene Glands: Within normal limits             Vagina: No gross lesions or discharge  Cervix: absence  Uterus Absent  Adnexa  Without masses or tenderness  Anus and perineum  normal   Rectovaginal  normal sphincter tone without palpated masses or tenderness             Hemoccult card provided     Assessment/Plan:  52 y.o. female for annual exam doing well on estradiol 1 mg  daily for vasomotor symptoms. The estradiol has helped also for vaginal atrophy and dyspareunia. She was reminded to schedule for colonoscopy. We discussed importance of regular exercise and calcium intake for osteoporosis prevention. Will recommend next year have a bone density study. She did receive the Tdap vaccine today and Pap smear was done today as well.    Ok Edwards MD, 11:28 AM 01/09/2013

## 2013-01-11 DIAGNOSIS — J309 Allergic rhinitis, unspecified: Secondary | ICD-10-CM | POA: Diagnosis not present

## 2013-01-15 DIAGNOSIS — J309 Allergic rhinitis, unspecified: Secondary | ICD-10-CM | POA: Diagnosis not present

## 2013-01-23 DIAGNOSIS — J309 Allergic rhinitis, unspecified: Secondary | ICD-10-CM | POA: Diagnosis not present

## 2013-01-29 ENCOUNTER — Other Ambulatory Visit: Payer: Self-pay | Admitting: Family

## 2013-01-29 ENCOUNTER — Ambulatory Visit (INDEPENDENT_AMBULATORY_CARE_PROVIDER_SITE_OTHER): Payer: Medicare Other | Admitting: Family

## 2013-01-29 ENCOUNTER — Ambulatory Visit: Payer: Medicare Other

## 2013-01-29 ENCOUNTER — Encounter: Payer: Self-pay | Admitting: Family

## 2013-01-29 VITALS — BP 112/60 | HR 59 | Ht 66.0 in | Wt 208.0 lb

## 2013-01-29 DIAGNOSIS — J309 Allergic rhinitis, unspecified: Secondary | ICD-10-CM | POA: Diagnosis not present

## 2013-01-29 DIAGNOSIS — M545 Low back pain, unspecified: Secondary | ICD-10-CM

## 2013-01-29 DIAGNOSIS — F419 Anxiety disorder, unspecified: Secondary | ICD-10-CM

## 2013-01-29 DIAGNOSIS — Z23 Encounter for immunization: Secondary | ICD-10-CM | POA: Diagnosis not present

## 2013-01-29 DIAGNOSIS — E7800 Pure hypercholesterolemia, unspecified: Secondary | ICD-10-CM

## 2013-01-29 DIAGNOSIS — Z0000 Encounter for general adult medical examination without abnormal findings: Secondary | ICD-10-CM | POA: Diagnosis not present

## 2013-01-29 DIAGNOSIS — F411 Generalized anxiety disorder: Secondary | ICD-10-CM | POA: Diagnosis not present

## 2013-01-29 DIAGNOSIS — G8929 Other chronic pain: Secondary | ICD-10-CM

## 2013-01-29 DIAGNOSIS — E039 Hypothyroidism, unspecified: Secondary | ICD-10-CM

## 2013-01-29 LAB — HEPATIC FUNCTION PANEL
ALT: 28 U/L (ref 0–35)
Albumin: 3.8 g/dL (ref 3.5–5.2)
Alkaline Phosphatase: 47 U/L (ref 39–117)
Bilirubin, Direct: 0 mg/dL (ref 0.0–0.3)
Total Protein: 6.6 g/dL (ref 6.0–8.3)

## 2013-01-29 LAB — CBC WITH DIFFERENTIAL/PLATELET
Basophils Absolute: 0 10*3/uL (ref 0.0–0.1)
Basophils Relative: 0.3 % (ref 0.0–3.0)
HCT: 35.3 % — ABNORMAL LOW (ref 36.0–46.0)
Hemoglobin: 11.6 g/dL — ABNORMAL LOW (ref 12.0–15.0)
Lymphocytes Relative: 26.2 % (ref 12.0–46.0)
Lymphs Abs: 3.8 10*3/uL (ref 0.7–4.0)
MCHC: 32.7 g/dL (ref 30.0–36.0)
Monocytes Relative: 7 % (ref 3.0–12.0)
Neutro Abs: 9.6 10*3/uL — ABNORMAL HIGH (ref 1.4–7.7)
RBC: 4.21 Mil/uL (ref 3.87–5.11)
RDW: 14.5 % (ref 11.5–14.6)

## 2013-01-29 LAB — POCT URINALYSIS DIPSTICK
Blood, UA: NEGATIVE
Nitrite, UA: NEGATIVE
Protein, UA: NEGATIVE
Spec Grav, UA: 1.02
Urobilinogen, UA: 0.2
pH, UA: 6.5

## 2013-01-29 LAB — BASIC METABOLIC PANEL
BUN: 12 mg/dL (ref 6–23)
Calcium: 9 mg/dL (ref 8.4–10.5)
Creatinine, Ser: 0.9 mg/dL (ref 0.4–1.2)

## 2013-01-29 LAB — LIPID PANEL
Cholesterol: 163 mg/dL (ref 0–200)
LDL Cholesterol: 62 mg/dL (ref 0–99)
Total CHOL/HDL Ratio: 2
Triglycerides: 59 mg/dL (ref 0.0–149.0)

## 2013-01-29 MED ORDER — POTASSIUM CHLORIDE CRYS ER 20 MEQ PO TBCR: 20.0000 meq | EXTENDED_RELEASE_TABLET | Freq: Every day | ORAL | Status: DC

## 2013-01-29 MED ORDER — DIAZEPAM 5 MG PO TABS: ORAL_TABLET | ORAL | Status: DC

## 2013-01-29 NOTE — Progress Notes (Signed)
Subjective:    Patient ID: Kristin Merritt, female    DOB: 06/04/1960, 52 y.o.   MRN: [id number]  HPI This is a routine physical examination for this healthy  Female. Reviewed all health maintenance protocols including mammography colonoscopy bone density and reviewed appropriate screening labs. Her immunization history was reviewed as well as her current medications and allergies refills of her chronic medications were given and the plan for yearly health maintenance was discussed all orders and referrals were made as appropriate.   Review of Systems  Constitutional: Negative.   Eyes: Negative.   Respiratory: Negative.   Cardiovascular: Negative.   Gastrointestinal: Negative.   Endocrine: Negative.   Genitourinary: Negative.   Musculoskeletal: Negative.   Skin: Negative.   Allergic/Immunologic: Negative.   Neurological: Negative.   Hematological: Negative.   Psychiatric/Behavioral: Negative.    Past Medical History  Diagnosis Date  . Allergy   . Anxiety   . Hypertension   . Lichen sclerosus et atrophicus   . 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 Postmenopausal HRT (hormone replacement therapy) - followed by Dr. Lily Peer in gyn 07/11/2012  . Asthma   . Uterine cancer   . Depression   . High cholesterol   . UTI (urinary tract infection)     History   Social History  . Marital Status: Widowed    Spouse Name: N/A    Number of Children: N/A  . Years of Education: N/A   Occupational History  . Not on file.   Social History Main Topics  . Smoking status: Former Smoker -- 1.00 packs/day for 20 years    Types: Cigarettes    Quit date: 06/05/2012  . Smokeless tobacco: Former Neurosurgeon  . Alcohol Use: No  . Drug Use: No  . Sexual Activity: Yes    Birth Control/ Protection: Condom   Other Topics Concern  . Not on file   Social History Narrative   Work or School: retired Surveyor, mining Situation: lives alone       Spiritual  Beliefs: Christain      Lifestyle: getting back into exercising and eating well             Past Surgical History  Procedure Laterality Date  . Eye surgery    . Abdominal hysterectomy    . Spine surgery      Family History  Problem Relation Age of Onset  . Cancer Mother     LUNG   . Breast cancer Mother     No Known Allergies  Current Outpatient Prescriptions on File Prior to Visit  Medication Sig Dispense Refill  . albuterol (PROVENTIL HFA;VENTOLIN HFA) 108 (90 BASE) MCG/ACT inhaler Inhale 2 puffs into the lungs every 6 (six) hours as needed.      Marland Kitchen aspirin 81 MG tablet Take 81 mg by mouth daily.      Marland Kitchen atorvastatin (LIPITOR) 20 MG tablet Take 1 tablet (20 mg total) by mouth daily.  90 tablet  3  . docusate sodium (COLACE) 50 MG capsule Take by mouth 2 (two) times daily. Over the counter Equate stool softener      . estradiol (ESTRACE) 1 MG tablet Take 1 tablet (1 mg total) by mouth daily.  30 tablet  11  . fluconazole (DIFLUCAN) 150 MG tablet Use only if needed for yeast infection after antibiotic.  5 tablet  0  . Fluticasone-Salmeterol (ADVAIR) 250-50 MCG/DOSE AEPB Inhale  1 puff into the lungs as needed.      . lansoprazole (PREVACID) 30 MG capsule Take 1 capsule (30 mg total) by mouth daily.  90 capsule  3  . metroNIDAZOLE (METROGEL VAGINAL) 0.75 % vaginal gel Use one applicator PV at bedtime for 5 days  70 g  0  . Olmesartan-Amlodipine-HCTZ 40-5-12.5 MG TABS One by mouth daily  90 tablet  3  . oxyCODONE-acetaminophen (ROXICET) 5-325 MG per tablet Take 1 tablet by mouth every 8 (eight) hours as needed for pain.  20 tablet  0  . predniSONE (DELTASONE) 20 MG tablet 60mg  PO qam x 3 days, 40mg  po qam x 3 days, 20mg  qam x 3 days  18 tablet  0  . zolpidem (AMBIEN) 10 MG tablet Take 10 mg by mouth at bedtime as needed for sleep.      . ciprofloxacin (CIPRO) 250 MG tablet Take 1 tablet (250 mg total) by mouth as needed. Take after intercourse  5 tablet  1   No current  facility-administered medications on file prior to visit.    BP 112/60  Pulse 59  Ht 5\' 6"  (1.676 m)  Wt 208 lb (94.348 kg)  BMI 33.59 kg/m2chart    Objective:   Physical Exam  Constitutional: She is oriented to person, place, and time. She appears well-developed and well-nourished.  HENT:  Head: Normocephalic.  Right Ear: External ear normal.  Left Ear: External ear normal.  Nose: Nose normal.  Mouth/Throat: Oropharynx is clear and moist.  Eyes: Conjunctivae and EOM are normal. Pupils are equal, round, and reactive to light. Right eye exhibits no discharge. Left eye exhibits no discharge.  Neck: Normal range of motion. Neck supple. No thyromegaly present.  Cardiovascular: Normal rate, regular rhythm and normal heart sounds.   Pulmonary/Chest: Effort normal and breath sounds normal.  Abdominal: Soft.  Musculoskeletal: Normal range of motion. She exhibits no edema and no tenderness.  Neurological: She is alert and oriented to person, place, and time. She has normal reflexes. She displays normal reflexes. No cranial nerve deficit. Coordination normal.  Skin: Skin is warm and dry.  Psychiatric: She has a normal mood and affect.    EKG: WNL      Assessment & Plan:  Assessment: 1. Complete physical exam 2. Hyperlipidemia 3. Hypertension 4. Anxiety 5. Chronic Low Back Pain   Plan: Referred to neurosurgeon. Encouraged healthy diet, exercise, weight reduction. Low back strengthening exercises. Encouraged her to join [employer]. Download [employer] app to cell phone.

## 2013-01-29 NOTE — Patient Instructions (Addendum)
1. [employer] 2. [employer] ([employer] app to your phone)  Fat and Cholesterol Control Diet Cholesterol levels in your body are determined significantly by your diet. Cholesterol levels may also be related to heart disease. The following material helps to explain this relationship and discusses what you can do to help keep your heart healthy. Not all cholesterol is bad. Low-density lipoprotein (LDL) cholesterol is the "bad" cholesterol. It may cause fatty deposits to build up inside your arteries. High-density lipoprotein (HDL) cholesterol is "good." It helps to remove the "bad" LDL cholesterol from your blood. Cholesterol is a very important risk factor for heart disease. Other risk factors are high blood pressure, smoking, stress, heredity, and weight. The heart muscle gets its supply of blood through the coronary arteries. If your LDL cholesterol is high and your HDL cholesterol is low, you are at risk for having fatty deposits build up in your coronary arteries. This leaves less room through which blood can flow. Without sufficient blood and oxygen, the heart muscle cannot function properly and you may feel chest pains (angina pectoris). When a coronary artery closes up entirely, a part of the heart muscle may die causing a heart attack (myocardial infarction). CHECKING CHOLESTEROL When your caregiver sends your blood to a lab to be examined for cholesterol, a complete lipid (fat) profile may be done. With this test, the total amount of cholesterol and levels of LDL and HDL are determined. Triglycerides are a type of fat that circulates in the blood. They can also be used to determine heart disease risk. The list below describes what the numbers should be: Test: Total Cholesterol.  Less than 200 mg/dl. Test: LDL "bad cholesterol."  Less than 100 mg/dl.  Less than 70 mg/dl if you are at very high risk of a heart attack or sudden cardiac death. Test: HDL "good cholesterol."  Greater  than 50 mg/dl for women.  Greater than 40 mg/dl for men. Test: Triglycerides.  Less than 150 mg/dl. CONTROLLING CHOLESTEROL WITH DIET Although exercise and lifestyle factors are important, your diet is key. That is because certain foods are known to raise cholesterol and others to lower it. The goal is to balance foods for their effect on cholesterol and more importantly, to replace saturated and trans fat with other types of fat, such as monounsaturated fat, polyunsaturated fat, and omega-3 fatty acids. On average, a person should consume no more than 15 to 17 g of saturated fat daily. Saturated and trans fats are considered "bad" fats, and they will raise LDL cholesterol. Saturated fats are primarily found in animal products such as meats, butter, and cream. However, that does not mean you need to give up all your favorite foods. Today, there are good tasting, low-fat, low-cholesterol substitutes for most of the things you like to eat. Choose low-fat or nonfat alternatives. Choose round or loin cuts of red meat. These types of cuts are lowest in fat and cholesterol. Chicken (without the skin), fish, veal, and ground [place] breast are great choices. Eliminate fatty meats, such as hot dogs and salami. Even shellfish have little or no saturated fat. Have a 3 oz (85 g) portion when you eat lean meat, poultry, or fish. Trans fats are also called "partially hydrogenated oils." They are oils that have been scientifically manipulated so that they are solid at room temperature resulting in a longer shelf life and improved taste and texture of foods in which they are added. Trans fats are found in stick margarine,  some tub margarines, cookies, crackers, and baked goods.  When baking and cooking, oils are a great substitute for butter. The monounsaturated oils are especially beneficial since it is believed they lower LDL and raise HDL. The oils you should avoid entirely are saturated tropical oils, such as coconut  and palm.  Remember to eat a lot from food groups that are naturally free of saturated and trans fat, including fish, fruit, vegetables, beans, grains (barley, rice, couscous, bulgur wheat), and pasta (without cream sauces).  IDENTIFYING FOODS THAT LOWER CHOLESTEROL  Soluble fiber may lower your cholesterol. This type of fiber is found in fruits such as apples, vegetables such as broccoli, potatoes, and carrots, legumes such as beans, peas, and lentils, and grains such as barley. Foods fortified with plant sterols (phytosterol) may also lower cholesterol. You should eat at least 2 g per day of these foods for a cholesterol lowering effect.  Read package labels to identify low-saturated fats, trans fat free, and low-fat foods at the supermarket. Select cheeses that have only 2 to 3 g saturated fat per ounce. Use a heart-healthy tub margarine that is free of trans fats or partially hydrogenated oil. When buying baked goods (cookies, crackers), avoid partially hydrogenated oils. Breads and muffins should be made from whole grains (whole-wheat or whole oat flour, instead of "flour" or "enriched flour"). Buy non-creamy canned soups with reduced salt and no added fats.  FOOD PREPARATION TECHNIQUES  Never deep-fry. If you must fry, either stir-fry, which uses very little fat, or use non-stick cooking sprays. When possible, broil, bake, or roast meats, and steam vegetables. Instead of putting butter or margarine on vegetables, use lemon and herbs, applesauce, and cinnamon (for squash and sweet potatoes), nonfat yogurt, salsa, and low-fat dressings for salads.  LOW-SATURATED FAT / LOW-FAT FOOD SUBSTITUTES Meats / Saturated Fat (g)  Avoid: Steak, marbled (3 oz/85 g) / 11 g  Choose: Steak, lean (3 oz/85 g) / 4 g  Avoid: Hamburger (3 oz/85 g) / 7 g  Choose: Hamburger, lean (3 oz/85 g) / 5 g  Avoid: Ham (3 oz/85 g) / 6 g  Choose: Ham, lean cut (3 oz/85 g) / 2.4 g  Avoid: Chicken, with skin, dark meat (3  oz/85 g) / 4 g  Choose: Chicken, skin removed, dark meat (3 oz/85 g) / 2 g  Avoid: Chicken, with skin, light meat (3 oz/85 g) / 2.5 g  Choose: Chicken, skin removed, light meat (3 oz/85 g) / 1 g Dairy / Saturated Fat (g)  Avoid: Whole milk (1 cup) / 5 g  Choose: Low-fat milk, 2% (1 cup) / 3 g  Choose: Low-fat milk, 1% (1 cup) / 1.5 g  Choose: Skim milk (1 cup) / 0.3 g  Avoid: Hard cheese (1 oz/28 g) / 6 g  Choose: Skim milk cheese (1 oz/28 g) / 2 to 3 g  Avoid: Cottage cheese, 4% fat (1 cup) / 6.5 g  Choose: Low-fat cottage cheese, 1% fat (1 cup) / 1.5 g  Avoid: Ice cream (1 cup) / 9 g  Choose: Sherbet (1 cup) / 2.5 g  Choose: Nonfat frozen yogurt (1 cup) / 0.3 g  Choose: Frozen fruit bar / trace  Avoid: Whipped cream (1 tbs) / 3.5 g  Choose: Nondairy whipped topping (1 tbs) / 1 g Condiments / Saturated Fat (g)  Avoid: Mayonnaise (1 tbs) / 2 g  Choose: Low-fat mayonnaise (1 tbs) / 1 g  Avoid: Butter (1 tbs) / 7 g  Choose: Extra light margarine (1 tbs) / 1 g  Avoid: Coconut oil (1 tbs) / 11.8 g  Choose: Olive oil (1 tbs) / 1.8 g  Choose: Corn oil (1 tbs) / 1.7 g  Choose: Safflower oil (1 tbs) / 1.2 g  Choose: Sunflower oil (1 tbs) / 1.4 g  Choose: Soybean oil (1 tbs) / 2.4 g  Choose: Canola oil (1 tbs) / 1 g Document Released: 05/10/2005 Document Revised: 08/02/2011 Document Reviewed: 10/29/2010 [employer] Patient Information 2014 [place], [place].

## 2013-01-30 LAB — T3, FREE: T3, Free: 2.2 pg/mL — ABNORMAL LOW (ref 2.3–4.2)

## 2013-02-07 DIAGNOSIS — J309 Allergic rhinitis, unspecified: Secondary | ICD-10-CM | POA: Diagnosis not present

## 2013-02-16 DIAGNOSIS — J309 Allergic rhinitis, unspecified: Secondary | ICD-10-CM | POA: Diagnosis not present

## 2013-02-19 DIAGNOSIS — J3089 Other allergic rhinitis: Secondary | ICD-10-CM | POA: Diagnosis not present

## 2013-02-19 DIAGNOSIS — R059 Cough, unspecified: Secondary | ICD-10-CM | POA: Diagnosis not present

## 2013-02-19 DIAGNOSIS — R05 Cough: Secondary | ICD-10-CM | POA: Diagnosis not present

## 2013-02-19 DIAGNOSIS — H1045 Other chronic allergic conjunctivitis: Secondary | ICD-10-CM | POA: Diagnosis not present

## 2013-02-19 DIAGNOSIS — J309 Allergic rhinitis, unspecified: Secondary | ICD-10-CM | POA: Diagnosis not present

## 2013-02-19 DIAGNOSIS — J019 Acute sinusitis, unspecified: Secondary | ICD-10-CM | POA: Diagnosis not present

## 2013-02-28 DIAGNOSIS — J309 Allergic rhinitis, unspecified: Secondary | ICD-10-CM | POA: Diagnosis not present

## 2013-03-09 DIAGNOSIS — J309 Allergic rhinitis, unspecified: Secondary | ICD-10-CM | POA: Diagnosis not present

## 2013-03-15 DIAGNOSIS — J309 Allergic rhinitis, unspecified: Secondary | ICD-10-CM | POA: Diagnosis not present

## 2013-03-23 DIAGNOSIS — J309 Allergic rhinitis, unspecified: Secondary | ICD-10-CM | POA: Diagnosis not present

## 2013-03-27 DIAGNOSIS — J3089 Other allergic rhinitis: Secondary | ICD-10-CM | POA: Diagnosis not present

## 2013-03-27 DIAGNOSIS — J309 Allergic rhinitis, unspecified: Secondary | ICD-10-CM | POA: Diagnosis not present

## 2013-03-27 DIAGNOSIS — R05 Cough: Secondary | ICD-10-CM | POA: Diagnosis not present

## 2013-03-27 DIAGNOSIS — R059 Cough, unspecified: Secondary | ICD-10-CM | POA: Diagnosis not present

## 2013-03-27 DIAGNOSIS — H1045 Other chronic allergic conjunctivitis: Secondary | ICD-10-CM | POA: Diagnosis not present

## 2013-04-06 DIAGNOSIS — J309 Allergic rhinitis, unspecified: Secondary | ICD-10-CM | POA: Diagnosis not present

## 2013-04-13 DIAGNOSIS — J309 Allergic rhinitis, unspecified: Secondary | ICD-10-CM | POA: Diagnosis not present

## 2013-04-16 DIAGNOSIS — J309 Allergic rhinitis, unspecified: Secondary | ICD-10-CM | POA: Diagnosis not present

## 2013-04-23 ENCOUNTER — Ambulatory Visit (INDEPENDENT_AMBULATORY_CARE_PROVIDER_SITE_OTHER): Payer: Medicare Other | Admitting: Family

## 2013-04-23 ENCOUNTER — Encounter: Payer: Self-pay | Admitting: Family

## 2013-04-23 VITALS — BP 116/68 | HR 87 | Wt 217.0 lb

## 2013-04-23 DIAGNOSIS — N390 Urinary tract infection, site not specified: Secondary | ICD-10-CM | POA: Diagnosis not present

## 2013-04-23 DIAGNOSIS — E876 Hypokalemia: Secondary | ICD-10-CM

## 2013-04-23 DIAGNOSIS — J309 Allergic rhinitis, unspecified: Secondary | ICD-10-CM | POA: Diagnosis not present

## 2013-04-23 DIAGNOSIS — E0590 Thyrotoxicosis, unspecified without thyrotoxic crisis or storm: Secondary | ICD-10-CM

## 2013-04-23 LAB — T3, FREE: T3, Free: 2.8 pg/mL (ref 2.3–4.2)

## 2013-04-23 LAB — BASIC METABOLIC PANEL
BUN: 9 mg/dL (ref 6–23)
Chloride: 100 mEq/L (ref 96–112)
Potassium: 3.7 mEq/L (ref 3.5–5.1)
Sodium: 138 mEq/L (ref 135–145)

## 2013-04-23 LAB — TSH: TSH: 0.95 u[IU]/mL (ref 0.35–5.50)

## 2013-04-23 LAB — T4, FREE: Free T4: 0.83 ng/dL (ref 0.60–1.60)

## 2013-04-23 MED ORDER — FLUCONAZOLE 150 MG PO TABS: ORAL_TABLET | ORAL | Status: DC

## 2013-04-23 NOTE — Progress Notes (Signed)
Subjective:    Patient ID: Kristin Merritt, female    DOB: Feb 08, 1961, 52 y.o.   MRN: [id number]  HPI  52 year old [place] American female, recently stopped smoking is in for recheck of hyperthyroidism. Her last office visit she was found to have low TSH and abnormal T3. She also has concerns today are weight gain since she stopped smoking. Denies any heart palpitations. Has concerns of frequent vaginal infections particularly after intercourse her after she takes an antibiotic. Her previous primary care provider had a standing prescription for Diflucan for her. Never had HIV testing. Blood sugars have been normal.  Review of Systems  Constitutional: Positive for unexpected weight change. Negative for chills and fatigue.  HENT: Negative.   Respiratory: Negative.   Cardiovascular: Negative.   Gastrointestinal: Negative.   Endocrine: Negative.   Genitourinary: Negative.   Musculoskeletal: Negative.   Skin: Negative.   Neurological: Negative.   Hematological: Negative.   Psychiatric/Behavioral: Negative.    Past Medical History  Diagnosis Date  . Allergy   . Anxiety   . Hypertension   . Lichen sclerosus et atrophicus   . 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 Postmenopausal HRT (hormone replacement therapy) - followed by Dr. Lily Peer in gyn 07/11/2012  . Asthma   . Uterine cancer   . Depression   . High cholesterol   . UTI (urinary tract infection)     History   Social History  . Marital Status: Widowed    Spouse Name: N/A    Number of Children: N/A  . Years of Education: N/A   Occupational History  . Not on file.   Social History Main Topics  . Smoking status: Former Smoker -- 1.00 packs/day for 20 years    Types: Cigarettes    Quit date: 06/05/2012  . Smokeless tobacco: Former Neurosurgeon  . Alcohol Use: No  . Drug Use: No  . Sexual Activity: Yes    Birth Control/ Protection: Condom   Other Topics Concern  . Not on file   Social  History Narrative   Work or School: retired Surveyor, mining Situation: lives alone       Spiritual Beliefs: Christain      Lifestyle: getting back into exercising and eating well             Past Surgical History  Procedure Laterality Date  . Eye surgery    . Abdominal hysterectomy    . Spine surgery      Family History  Problem Relation Age of Onset  . Cancer Mother     LUNG   . Breast cancer Mother     No Known Allergies  Current Outpatient Prescriptions on File Prior to Visit  Medication Sig Dispense Refill  . albuterol (PROVENTIL HFA;VENTOLIN HFA) 108 (90 BASE) MCG/ACT inhaler Inhale 2 puffs into the lungs every 6 (six) hours as needed.      Marland Kitchen aspirin 81 MG tablet Take 81 mg by mouth daily.      Marland Kitchen atorvastatin (LIPITOR) 20 MG tablet Take 1 tablet (20 mg total) by mouth daily.  90 tablet  3  . diazepam (VALIUM) 5 MG tablet Use very sparingly for panic attack - not more then once in a day.  30 tablet  2  . docusate sodium (COLACE) 50 MG capsule Take by mouth 2 (two) times daily. Over the counter Equate stool softener      .  estradiol (ESTRACE) 1 MG tablet Take 1 tablet (1 mg total) by mouth daily.  30 tablet  11  . Fluticasone-Salmeterol (ADVAIR) 250-50 MCG/DOSE AEPB Inhale 1 puff into the lungs as needed.      . lansoprazole (PREVACID) 30 MG capsule Take 1 capsule (30 mg total) by mouth daily.  90 capsule  3  . Olmesartan-Amlodipine-HCTZ 40-5-12.5 MG TABS One by mouth daily  90 tablet  3  . oxyCODONE-acetaminophen (ROXICET) 5-325 MG per tablet Take 1 tablet by mouth every 8 (eight) hours as needed for pain.  20 tablet  0  . potassium chloride SA (K-DUR,KLOR-CON) 20 MEQ tablet Take 1 tablet (20 mEq total) by mouth daily.  30 tablet  3  . zolpidem (AMBIEN) 10 MG tablet Take 10 mg by mouth at bedtime as needed for sleep.      . ciprofloxacin (CIPRO) 250 MG tablet Take 1 tablet (250 mg total) by mouth as needed. Take after intercourse  5 tablet  1  . metroNIDAZOLE  (METROGEL VAGINAL) 0.75 % vaginal gel Use one applicator PV at bedtime for 5 days  70 g  0  . predniSONE (DELTASONE) 20 MG tablet 60mg  PO qam x 3 days, 40mg  po qam x 3 days, 20mg  qam x 3 days  18 tablet  0   No current facility-administered medications on file prior to visit.    BP 116/68  Pulse 87  Wt 217 lb (98.431 kg)chart    Objective:   Physical Exam  Constitutional: She is oriented to person, place, and time. She appears well-developed and well-nourished.  HENT:  Right Ear: External ear normal.  Left Ear: External ear normal.  Nose: Nose normal.  Mouth/Throat: Oropharynx is clear and moist.  Neck: Normal range of motion. Neck supple.  Cardiovascular: Normal rate, regular rhythm and normal heart sounds.   Pulmonary/Chest: Effort normal and breath sounds normal.  Abdominal: Soft. Bowel sounds are normal.  Musculoskeletal: Normal range of motion.  Neurological: She is alert and oriented to person, place, and time.  Skin: Skin is warm and dry.  Psychiatric: She has a normal mood and affect.          Assessment & Plan:  Assessment:  1. Hyperthyroidism  2. Hypertension 3. Hypokalemia  4. Weight Gain   Plan: Thyroid panel sent. Refer to endocrinology if abnormal. Perception for Diflucan given. Since she has a history of hypokalemia I did not refill her quinine prescription. We'll see where her potassium level is today and go from there. Labs sen for admission for the results.jjt

## 2013-04-23 NOTE — Patient Instructions (Signed)
Hyperthyroidism  The thyroid is a large gland located in the lower front part of your neck. The thyroid helps control metabolism. Metabolism is how your body uses food. It controls metabolism with the hormone thyroxine. When the thyroid is overactive, it produces too much hormone. When this happens, these following problems may occur:   · Nervousness  · Heat intolerance  · Weight loss (in spite of increase food intake)  · Diarrhea  · Change in hair or skin texture  · Palpitations (heart skipping or having extra beats)  · Tachycardia (rapid heart rate)  · Loss of menstruation (amenorrhea)  · Shaking of the hands  CAUSES  · Grave's Disease (the immune system attacks the thyroid gland). This is the most common cause.  · Inflammation of the thyroid gland.  · Tumor (usually benign) in the thyroid gland or elsewhere.  · Excessive use of thyroid medications (both prescription and 'natural').  · Excessive ingestion of Iodine.  DIAGNOSIS   To prove hyperthyroidism, your caregiver may do blood tests and ultrasound tests. Sometimes the signs are hidden. It may be necessary for your caregiver to watch this illness with blood tests, either before or after diagnosis and treatment.  TREATMENT  Short-term treatment  There are several treatments to control symptoms. Drugs called beta blockers may give some relief. Drugs that decrease hormone production will provide temporary relief in many people. These measures will usually not give permanent relief.  Definitive therapy  There are treatments available which can be discussed between you and your caregiver which will permanently treat the problem. These treatments range from surgery (removal of the thyroid), to the use of radioactive iodine (destroys the thyroid by radiation), to the use of antithyroid drugs (interfere with hormone synthesis). The first two treatments are permanent and usually successful. They most often require hormone replacement therapy for life. This is because  it is impossible to remove or destroy the exact amount of thyroid required to make a person euthyroid (normal).  HOME CARE INSTRUCTIONS   See your caregiver if the problems you are being treated for get worse. Examples of this would be the problems listed above.  SEEK MEDICAL CARE IF:  Your general condition worsens.  MAKE SURE YOU:   · Understand these instructions.  · Will watch your condition.  · Will get help right away if you are not doing well or get worse.  Document Released: 05/10/2005 Document Revised: 08/02/2011 Document Reviewed: 09/21/2006  ExitCare® Patient Information ©2014 [employer].

## 2013-04-23 NOTE — Progress Notes (Signed)
Pre visit review using our clinic review tool, if applicable. No additional management support is needed unless otherwise documented below in the visit note. 

## 2013-04-30 ENCOUNTER — Ambulatory Visit
Admission: RE | Admit: 2013-04-30 | Discharge: 2013-04-30 | Disposition: A | Discharge status: Home / Self Care | Payer: Medicare Other | Source: Ambulatory Visit | Attending: Allergy | Admitting: Allergy

## 2013-04-30 ENCOUNTER — Other Ambulatory Visit: Payer: Self-pay | Admitting: Allergy

## 2013-04-30 DIAGNOSIS — J309 Allergic rhinitis, unspecified: Secondary | ICD-10-CM | POA: Diagnosis not present

## 2013-04-30 DIAGNOSIS — J3089 Other allergic rhinitis: Secondary | ICD-10-CM

## 2013-05-04 DIAGNOSIS — H1045 Other chronic allergic conjunctivitis: Secondary | ICD-10-CM | POA: Diagnosis not present

## 2013-05-04 DIAGNOSIS — J3089 Other allergic rhinitis: Secondary | ICD-10-CM | POA: Diagnosis not present

## 2013-05-04 DIAGNOSIS — R059 Cough, unspecified: Secondary | ICD-10-CM | POA: Diagnosis not present

## 2013-05-11 DIAGNOSIS — J309 Allergic rhinitis, unspecified: Secondary | ICD-10-CM | POA: Diagnosis not present

## 2013-05-14 DIAGNOSIS — J309 Allergic rhinitis, unspecified: Secondary | ICD-10-CM | POA: Diagnosis not present

## 2013-05-19 ENCOUNTER — Other Ambulatory Visit: Payer: Self-pay | Admitting: Family

## 2013-05-22 DIAGNOSIS — J309 Allergic rhinitis, unspecified: Secondary | ICD-10-CM | POA: Diagnosis not present

## 2013-06-01 DIAGNOSIS — J309 Allergic rhinitis, unspecified: Secondary | ICD-10-CM | POA: Diagnosis not present

## 2013-06-04 ENCOUNTER — Telehealth: Payer: Self-pay | Admitting: Family

## 2013-06-04 DIAGNOSIS — J309 Allergic rhinitis, unspecified: Secondary | ICD-10-CM | POA: Diagnosis not present

## 2013-06-04 NOTE — Telephone Encounter (Signed)
Pt would like to know if you would send prednisone pak, 20 mg, 18 pills, for sinus inf. Pt had this same thing before.  Advised pt she may need appt. appt mmade, but if you will sned, we can cancel appt. pls advise, [facility] [address]

## 2013-06-05 ENCOUNTER — Ambulatory Visit: Payer: Medicare Other | Admitting: Family

## 2013-06-05 MED ORDER — PREDNISONE 20 MG PO TABS: ORAL_TABLET | ORAL | Status: DC

## 2013-06-05 NOTE — Telephone Encounter (Signed)
Please advise 

## 2013-06-05 NOTE — Telephone Encounter (Signed)
Done this time only

## 2013-06-05 NOTE — Telephone Encounter (Signed)
Pt aware.

## 2013-06-11 DIAGNOSIS — R059 Cough, unspecified: Secondary | ICD-10-CM | POA: Diagnosis not present

## 2013-06-11 DIAGNOSIS — J3089 Other allergic rhinitis: Secondary | ICD-10-CM | POA: Diagnosis not present

## 2013-06-11 DIAGNOSIS — H1045 Other chronic allergic conjunctivitis: Secondary | ICD-10-CM | POA: Diagnosis not present

## 2013-06-11 DIAGNOSIS — T783XXA Angioneurotic edema, initial encounter: Secondary | ICD-10-CM | POA: Diagnosis not present

## 2013-06-11 DIAGNOSIS — R05 Cough: Secondary | ICD-10-CM | POA: Diagnosis not present

## 2013-06-12 DIAGNOSIS — T783XXA Angioneurotic edema, initial encounter: Secondary | ICD-10-CM | POA: Diagnosis not present

## 2013-06-15 DIAGNOSIS — J309 Allergic rhinitis, unspecified: Secondary | ICD-10-CM | POA: Diagnosis not present

## 2013-06-28 DIAGNOSIS — J309 Allergic rhinitis, unspecified: Secondary | ICD-10-CM | POA: Diagnosis not present

## 2013-06-28 DIAGNOSIS — F3289 Other specified depressive episodes: Secondary | ICD-10-CM | POA: Diagnosis not present

## 2013-06-28 DIAGNOSIS — H5789 Other specified disorders of eye and adnexa: Secondary | ICD-10-CM | POA: Diagnosis not present

## 2013-06-28 DIAGNOSIS — F329 Major depressive disorder, single episode, unspecified: Secondary | ICD-10-CM | POA: Diagnosis not present

## 2013-07-03 DIAGNOSIS — J309 Allergic rhinitis, unspecified: Secondary | ICD-10-CM | POA: Diagnosis not present

## 2013-07-09 DIAGNOSIS — J309 Allergic rhinitis, unspecified: Secondary | ICD-10-CM | POA: Diagnosis not present

## 2013-07-18 DIAGNOSIS — J309 Allergic rhinitis, unspecified: Secondary | ICD-10-CM | POA: Diagnosis not present

## 2013-07-27 ENCOUNTER — Encounter: Payer: Self-pay | Admitting: Family

## 2013-07-27 ENCOUNTER — Ambulatory Visit (INDEPENDENT_AMBULATORY_CARE_PROVIDER_SITE_OTHER): Payer: Medicare Other | Admitting: Family

## 2013-07-27 VITALS — BP 110/64 | HR 87 | Wt 216.0 lb

## 2013-07-27 DIAGNOSIS — I10 Essential (primary) hypertension: Secondary | ICD-10-CM

## 2013-07-27 DIAGNOSIS — J309 Allergic rhinitis, unspecified: Secondary | ICD-10-CM | POA: Diagnosis not present

## 2013-07-27 DIAGNOSIS — L259 Unspecified contact dermatitis, unspecified cause: Secondary | ICD-10-CM

## 2013-07-27 NOTE — Patient Instructions (Signed)

## 2013-07-27 NOTE — Progress Notes (Signed)
Pre visit review using our clinic review tool, if applicable. No additional management support is needed unless otherwise documented below in the visit note. 

## 2013-07-27 NOTE — Progress Notes (Signed)
Subjective:    Patient ID: Kristin Merritt, female    DOB: 1960-07-05, 53 y.o.   MRN: [id number]  HPI 53 year old AAF, with a history of Hypertension, Hypercholesterolemia, Hypokalemia, is in today with c/o a rash on her face x 2 days. She is insure if it is a chemical she put in her hair or a reaction to something. She reports having chronic edema around her eyes. Is currently seeing am allergist that suggested it could be a component of her blood pr essure medication. She is not sure which medication. She is currentaly taking  Xyzal, and tolerating it well. Reports she has not started Zantac because she was unsure of why it was started. Rash is improving.    Review of Systems  Constitutional: Negative.   HENT: Negative.   Respiratory: Negative.   Cardiovascular: Negative.   Gastrointestinal: Negative.   Endocrine: Negative.   Genitourinary: Negative.   Musculoskeletal: Negative.   Skin: Positive for rash.       Rash to face, lightened spots  Neurological: Negative.   Psychiatric/Behavioral: Negative.    Past Medical History  Diagnosis Date  . Allergy   . Anxiety   . Hypertension   . Lichen sclerosus et atrophicus   . 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 Postmenopausal HRT (hormone replacement therapy) - followed by Dr. Toney Rakes in gyn 07/11/2012  . Asthma   . Uterine cancer   . Depression   . High cholesterol   . UTI (urinary tract infection)     History   Social History  . Marital Status: Widowed    Spouse Name: N/A    Number of Children: N/A  . Years of Education: N/A   Occupational History  . Not on file.   Social History Main Topics  . Smoking status: Former Smoker -- 1.00 packs/day for 20 years    Types: Cigarettes    Quit date: 06/05/2012  . Smokeless tobacco: Former Systems developer  . Alcohol Use: No  . Drug Use: No  . Sexual Activity: Yes    Birth Control/ Protection: Condom   Other Topics Concern  . 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 Eye surgery    . Abdominal hysterectomy    . Spine surgery      Family History  Problem Relation Age of Onset  . Cancer Mother     LUNG   . Breast cancer Mother     No Known Allergies  Current Outpatient Prescriptions on File Prior to Visit  Medication Sig Dispense Refill  . albuterol (PROVENTIL HFA;VENTOLIN HFA) 108 (90 BASE) MCG/ACT inhaler Inhale 2 puffs into the lungs every 6 (six) hours as needed.      Marland Kitchen aspirin 81 MG tablet Take 81 mg by mouth daily.      Marland Kitchen atorvastatin (LIPITOR) 20 MG tablet Take 1 tablet (20 mg total) by mouth daily.  90 tablet  3  . ciprofloxacin (CIPRO) 250 MG tablet Take 1 tablet (250 mg total) by mouth as needed. Take after intercourse  5 tablet  1  . diazepam (VALIUM) 5  MG tablet Use very sparingly for panic attack - not more then once in a day.  30 tablet  2  . docusate sodium (COLACE) 50 MG capsule Take by mouth 2 (two) times daily. Over the counter Equate stool softener      . estradiol (ESTRACE) 1 MG tablet Take 1 tablet (1 mg total) by mouth daily.  30 tablet  11  . fluconazole (DIFLUCAN) 150 MG tablet 1 tab a week  4 tablet  0  . Fluticasone-Salmeterol (ADVAIR) 250-50 MCG/DOSE AEPB Inhale 1 puff into the lungs as needed.      Marland Kitchen KLOR-CON M20 20 MEQ tablet TAKE 1 TABLET BY MOUTH EVERY DAY  30 tablet  3  . lansoprazole (PREVACID) 30 MG capsule Take 1 capsule (30 mg total) by mouth daily.  90 capsule  3  . metroNIDAZOLE (METROGEL VAGINAL) 0.75 % vaginal gel Use one applicator PV at bedtime for 5 days  70 g  0  . Olmesartan-Amlodipine-HCTZ 40-5-12.5 MG TABS One by mouth daily  90 tablet  3  . oxyCODONE-acetaminophen (ROXICET) 5-325 MG per tablet Take 1 tablet by mouth every 8 (eight) hours as  needed for pain.  20 tablet  0  . predniSONE (DELTASONE) 20 MG tablet 60mg  PO qam x 3 days, 40mg  po qam x 3 days, 20mg  qam x 3 days  18 tablet  0  . zolpidem (AMBIEN) 10 MG tablet Take 10 mg by mouth at bedtime as needed for sleep.       No current facility-administered medications on file prior to visit.    BP 110/64  Pulse 87  Wt 216 lb (97.977 kg)chart    Objective:   Physical Exam  Constitutional: She is oriented to person, place, and time. She appears well-developed and well-nourished.  HENT:  Right Ear: External ear normal.  Left Ear: External ear normal.  Nose: Nose normal.  Mouth/Throat: Oropharynx is clear and moist.  Neck: Normal range of motion. Neck supple.  Cardiovascular: Normal rate, regular rhythm and normal heart sounds.   Pulmonary/Chest: Effort normal and breath sounds normal.  Musculoskeletal: Normal range of motion.  Neurological: She is alert and oriented to person, place, and time.  Skin: Skin is warm and dry. Rash noted.  Hypopigmented patches to the left face that are dry and flaking.   Psychiatric: She has a normal mood and affect.          Assessment & Plan:  Shawnte was seen today for no specified reason.  Diagnoses and associated orders for this visit:  Allergic rhinitis  Contact dermatitis  Unspecified essential hypertension   Take Zantac as directed. Explained that it is a histamine blocker and she is not being treated for GERD but fior possible reaction. Discontinue Azor HCT and start Azor to see if it helps with edema around the eyes. Continue seeing allergist.

## 2013-07-28 ENCOUNTER — Telehealth: Payer: Self-pay | Admitting: Family

## 2013-07-28 NOTE — Telephone Encounter (Signed)
Relevant patient education assigned to patient using Emmi. ° °

## 2013-07-30 ENCOUNTER — Telehealth: Payer: Self-pay | Admitting: Family

## 2013-07-30 MED ORDER — AMLODIPINE-OLMESARTAN 5-40 MG PO TABS: 1.0000 | ORAL_TABLET | Freq: Every day | ORAL | Status: DC

## 2013-07-30 NOTE — Telephone Encounter (Signed)
Relevant patient education assigned to patient using Emmi. ° °

## 2013-07-31 DIAGNOSIS — J309 Allergic rhinitis, unspecified: Secondary | ICD-10-CM | POA: Diagnosis not present

## 2013-08-02 ENCOUNTER — Ambulatory Visit (INDEPENDENT_AMBULATORY_CARE_PROVIDER_SITE_OTHER): Payer: Medicare Other | Admitting: Women's Health

## 2013-08-02 ENCOUNTER — Encounter: Payer: Self-pay | Admitting: Women's Health

## 2013-08-02 DIAGNOSIS — N898 Other specified noninflammatory disorders of vagina: Secondary | ICD-10-CM | POA: Diagnosis not present

## 2013-08-02 DIAGNOSIS — L293 Anogenital pruritus, unspecified: Secondary | ICD-10-CM

## 2013-08-02 LAB — URINALYSIS W MICROSCOPIC + REFLEX CULTURE
Bilirubin Urine: NEGATIVE
GLUCOSE, UA: NEGATIVE mg/dL
Hgb urine dipstick: NEGATIVE
Ketones, ur: NEGATIVE mg/dL
Leukocytes, UA: NEGATIVE
NITRITE: NEGATIVE
Protein, ur: NEGATIVE mg/dL
SPECIFIC GRAVITY, URINE: 1.015 (ref 1.005–1.030)
Urobilinogen, UA: 0.2 mg/dL (ref 0.0–1.0)
pH: 7 (ref 5.0–8.0)

## 2013-08-02 LAB — WET PREP FOR TRICH, YEAST, CLUE
Trich, Wet Prep: NONE SEEN
WBC WET PREP: NONE SEEN
YEAST WET PREP: NONE SEEN

## 2013-08-02 MED ORDER — METRONIDAZOLE 0.75 % VA GEL: VAGINAL | Status: DC

## 2013-08-02 NOTE — Progress Notes (Signed)
Patient ID: Kristin Merritt, female   DOB: 06-19-1960, 53 y.o.   MRN: [id number] Presents with complaint of vaginal irritation with itching, slight odor. TAH for uterine cancer currently on estradiol with good symptom relief. Denies urinary symptoms. Abdominal pain or fever. History of frequent UTIs.  Exam: Appears well. External genitalia within normal limits. Speculum exam scant white discharge noted, wet prep positive for amines, clues, and TNTC bacteria. Bimanual no adnexal fullness or tenderness. UA: Negative.  Bacteria vaginosis  Plan: MetroGel vaginal cream 1 applicator at bedtime x5, alcohol precautions reviewed. Instructed to call if no relief of symptoms.

## 2013-08-02 NOTE — Patient Instructions (Signed)
Bacterial Vaginosis Bacterial vaginosis is an infection of the vagina. It happens when too many of certain germs (bacteria) grow in the vagina. HOME CARE  Take your medicine as told by your doctor.  Finish your medicine even if you start to feel better.  Do not have sex until you finish your medicine and are better.  Tell your sex partner that you have an infection. They should see their doctor for treatment.  Practice safe sex. Use condoms. Have only one sex partner. GET HELP IF:  You are not getting better after 3 days of treatment.  You have more grey fluid (discharge) coming from your vagina than before.  You have more pain than before.  You have a fever. MAKE SURE YOU:   Understand these instructions.  Will watch your condition.  Will get help right away if you are not doing well or get worse. Document Released: 02/17/2008 Document Revised: 02/28/2013 Document Reviewed: 12/20/2012 ExitCare Patient Information 2014 [employer].  

## 2013-08-06 DIAGNOSIS — J309 Allergic rhinitis, unspecified: Secondary | ICD-10-CM | POA: Diagnosis not present

## 2013-08-09 DIAGNOSIS — J309 Allergic rhinitis, unspecified: Secondary | ICD-10-CM | POA: Diagnosis not present

## 2013-08-09 DIAGNOSIS — H5789 Other specified disorders of eye and adnexa: Secondary | ICD-10-CM | POA: Diagnosis not present

## 2013-08-13 ENCOUNTER — Ambulatory Visit (INDEPENDENT_AMBULATORY_CARE_PROVIDER_SITE_OTHER): Payer: Medicare Other | Admitting: Family

## 2013-08-13 ENCOUNTER — Encounter: Payer: Self-pay | Admitting: Family

## 2013-08-13 VITALS — BP 116/62 | HR 91 | Wt 216.0 lb

## 2013-08-13 DIAGNOSIS — I10 Essential (primary) hypertension: Secondary | ICD-10-CM | POA: Diagnosis not present

## 2013-08-13 DIAGNOSIS — R609 Edema, unspecified: Secondary | ICD-10-CM

## 2013-08-13 DIAGNOSIS — J309 Allergic rhinitis, unspecified: Secondary | ICD-10-CM | POA: Diagnosis not present

## 2013-08-13 DIAGNOSIS — R600 Localized edema: Secondary | ICD-10-CM

## 2013-08-13 MED ORDER — OLMESARTAN-AMLODIPINE-HCTZ 40-5-12.5 MG PO TABS: 1.0000 | ORAL_TABLET | Freq: Every day | ORAL | Status: DC

## 2013-08-13 NOTE — Patient Instructions (Signed)
1. Drug Holiday as discussed.  2. See Specialist tomorrow

## 2013-08-13 NOTE — Progress Notes (Signed)
Pre visit review using our clinic review tool, if applicable. No additional management support is needed unless otherwise documented below in the visit note. 

## 2013-08-13 NOTE — Progress Notes (Signed)
Subjective:    Patient ID: Kristin Merritt, female    DOB: November 18, 1960, 53 y.o.   MRN: [id number]  HPI 53 year old F. in [place] female, nonsmoker with a history of hypertension is in today with persistent bilateral eye puffiness. At her last office visit we changed her blood pressure medication to see if the hydrochlorothiazide was causing an allergy. She now has peripheral edema and the swelling to her face if no better. She is currently under the care of an allergist. Has grown very frustrated and is requesting to try a drug holiday. She is also scheduled to see a specialist tomorrow in [place], [place] that her allergist referred her to for further management of eye puffiness.    Review of Systems  Constitutional: Negative.   HENT: Negative.   Eyes: Positive for itching.       Eye puffiness  Respiratory: Negative.   Cardiovascular: Positive for leg swelling. Negative for chest pain and palpitations.  Gastrointestinal: Negative.   Musculoskeletal: Negative.   Allergic/Immunologic: Positive for environmental allergies.  Neurological: Negative.   Hematological: Negative.   Psychiatric/Behavioral: Negative.    Past Medical History  Diagnosis Date  . Allergy   . Anxiety   . Hypertension   . Lichen sclerosus et atrophicus   . 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 Postmenopausal HRT (hormone replacement therapy) - followed by Dr. Toney Rakes in gyn 07/11/2012  . Asthma   . Uterine cancer   . Depression   . High cholesterol   . UTI (urinary tract infection)     History   Social History  . Marital Status: Widowed    Spouse Name: N/A    Number of Children: N/A  . Years of Education: N/A   Occupational History  . Not on file.   Social History Main Topics  . Smoking status: Former Smoker -- 1.00 packs/day for 20 years    Types: Cigarettes    Quit date: 06/05/2012  . Smokeless tobacco: Former Systems developer  . Alcohol Use: No  . Drug Use: No  . Sexual  Activity: Yes    Birth Control/ Protection: Condom   Other Topics Concern  . 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 Eye surgery    . Abdominal hysterectomy    . Spine surgery      Family History  Problem Relation Age of Onset  . Cancer Mother     LUNG   . Breast cancer Mother     No Known Allergies  Current Outpatient Prescriptions on File Prior to Visit  Medication Sig Dispense Refill  . albuterol (PROVENTIL HFA;VENTOLIN HFA) 108 (90 BASE) MCG/ACT inhaler Inhale 2 puffs into the lungs every 6 (six) hours as needed.      Marland Kitchen amLODipine-olmesartan (AZOR) 5-40 MG per tablet Take 1 tablet by mouth daily.  21 tablet  0  . aspirin 81 MG tablet Take 81 mg by mouth daily.      Marland Kitchen atorvastatin (LIPITOR) 20 MG tablet Take 1 tablet (20 mg total) by mouth daily.  90 tablet  3  . ciprofloxacin (CIPRO) 250 MG tablet Take  1 tablet (250 mg total) by mouth as needed. Take after intercourse  5 tablet  1  . diazepam (VALIUM) 5 MG tablet Use very sparingly for panic attack - not more then once in a day.  30 tablet  2  . docusate sodium (COLACE) 50 MG capsule Take by mouth 2 (two) times daily. Over the counter Equate stool softener      . estradiol (ESTRACE) 1 MG tablet Take 1 tablet (1 mg total) by mouth daily.  30 tablet  11  . fluconazole (DIFLUCAN) 150 MG tablet 1 tab a week  4 tablet  0  . Fluticasone-Salmeterol (ADVAIR) 250-50 MCG/DOSE AEPB Inhale 1 puff into the lungs as needed.      Marland Kitchen KLOR-CON M20 20 MEQ tablet TAKE 1 TABLET BY MOUTH EVERY DAY  30 tablet  3  . lansoprazole (PREVACID) 30 MG capsule Take 1 capsule (30 mg total) by mouth daily.  90 capsule  3  . levocetirizine (XYZAL) 5 MG tablet Take 5 mg by mouth every 12 (twelve) hours.      .  metroNIDAZOLE (METROGEL VAGINAL) 0.75 % vaginal gel Use one applicator PV at bedtime for 5 days  70 g  0  . montelukast (SINGULAIR) 10 MG tablet Take 10 mg by mouth at bedtime.      Marland Kitchen oxyCODONE-acetaminophen (ROXICET) 5-325 MG per tablet Take 1 tablet by mouth every 8 (eight) hours as needed for pain.  20 tablet  0  . predniSONE (DELTASONE) 20 MG tablet 60mg  PO qam x 3 days, 40mg  po qam x 3 days, 20mg  qam x 3 days  18 tablet  0  . ranitidine (ZANTAC) 150 MG tablet Take 150 mg by mouth 2 (two) times daily.      Marland Kitchen zolpidem (AMBIEN) 10 MG tablet Take 10 mg by mouth at bedtime as needed for sleep.       No current facility-administered medications on file prior to visit.    BP 116/62  Pulse 91  Wt 216 lb (97.977 kg)chart    Objective:   Physical Exam  Constitutional: She is oriented to person, place, and time. She appears well-developed and well-nourished.  HENT:  Right Ear: External ear normal.  Nose: Nose normal.  Mouth/Throat: Oropharynx is clear and moist.  Eyes: Conjunctivae and EOM are normal. Pupils are equal, round, and reactive to light.  Cardiovascular: Normal rate, regular rhythm and normal heart sounds.   Pulmonary/Chest: Effort normal and breath sounds normal.  Neurological: She is alert and oriented to person, place, and time.  Skin: Skin is warm and dry.  Psychiatric: She has a normal mood and affect.          Assessment & Plan:

## 2013-08-14 DIAGNOSIS — H5789 Other specified disorders of eye and adnexa: Secondary | ICD-10-CM | POA: Diagnosis not present

## 2013-08-14 DIAGNOSIS — H02849 Edema of unspecified eye, unspecified eyelid: Secondary | ICD-10-CM | POA: Diagnosis not present

## 2013-08-16 ENCOUNTER — Other Ambulatory Visit: Payer: Self-pay | Admitting: Family Medicine

## 2013-08-28 DIAGNOSIS — J309 Allergic rhinitis, unspecified: Secondary | ICD-10-CM | POA: Diagnosis not present

## 2013-09-07 DIAGNOSIS — J309 Allergic rhinitis, unspecified: Secondary | ICD-10-CM | POA: Diagnosis not present

## 2013-09-12 DIAGNOSIS — J309 Allergic rhinitis, unspecified: Secondary | ICD-10-CM | POA: Diagnosis not present

## 2013-09-13 DIAGNOSIS — J309 Allergic rhinitis, unspecified: Secondary | ICD-10-CM | POA: Diagnosis not present

## 2013-09-18 ENCOUNTER — Other Ambulatory Visit: Payer: Self-pay | Admitting: Family

## 2013-09-24 DIAGNOSIS — J309 Allergic rhinitis, unspecified: Secondary | ICD-10-CM | POA: Diagnosis not present

## 2013-09-25 DIAGNOSIS — H1045 Other chronic allergic conjunctivitis: Secondary | ICD-10-CM | POA: Diagnosis not present

## 2013-09-25 DIAGNOSIS — R05 Cough: Secondary | ICD-10-CM | POA: Diagnosis not present

## 2013-09-25 DIAGNOSIS — R059 Cough, unspecified: Secondary | ICD-10-CM | POA: Diagnosis not present

## 2013-09-25 DIAGNOSIS — J3089 Other allergic rhinitis: Secondary | ICD-10-CM | POA: Diagnosis not present

## 2013-09-28 DIAGNOSIS — J309 Allergic rhinitis, unspecified: Secondary | ICD-10-CM | POA: Diagnosis not present

## 2013-10-01 DIAGNOSIS — J309 Allergic rhinitis, unspecified: Secondary | ICD-10-CM | POA: Diagnosis not present

## 2013-10-05 DIAGNOSIS — J309 Allergic rhinitis, unspecified: Secondary | ICD-10-CM | POA: Diagnosis not present

## 2013-10-12 DIAGNOSIS — J309 Allergic rhinitis, unspecified: Secondary | ICD-10-CM | POA: Diagnosis not present

## 2013-10-16 ENCOUNTER — Encounter: Payer: Self-pay | Admitting: Family

## 2013-10-16 ENCOUNTER — Ambulatory Visit (INDEPENDENT_AMBULATORY_CARE_PROVIDER_SITE_OTHER): Payer: Medicare Other | Admitting: Family

## 2013-10-16 VITALS — BP 100/50 | HR 88 | Temp 98.5°F | Ht 66.0 in | Wt 217.0 lb

## 2013-10-16 DIAGNOSIS — R609 Edema, unspecified: Secondary | ICD-10-CM | POA: Diagnosis not present

## 2013-10-16 DIAGNOSIS — E785 Hyperlipidemia, unspecified: Secondary | ICD-10-CM

## 2013-10-16 DIAGNOSIS — I10 Essential (primary) hypertension: Secondary | ICD-10-CM | POA: Diagnosis not present

## 2013-10-16 DIAGNOSIS — J309 Allergic rhinitis, unspecified: Secondary | ICD-10-CM | POA: Diagnosis not present

## 2013-10-16 LAB — COMPREHENSIVE METABOLIC PANEL
ALBUMIN: 3.8 g/dL (ref 3.5–5.2)
ALT: 22 U/L (ref 0–35)
AST: 22 U/L (ref 0–37)
Alkaline Phosphatase: 52 U/L (ref 39–117)
BUN: 10 mg/dL (ref 6–23)
CO2: 29 meq/L (ref 19–32)
Calcium: 9.3 mg/dL (ref 8.4–10.5)
Chloride: 100 mEq/L (ref 96–112)
Creatinine, Ser: 0.9 mg/dL (ref 0.4–1.2)
GFR: 87.68 mL/min (ref >60.00)
GLUCOSE: 98 mg/dL (ref 70–99)
Potassium: 3.4 mEq/L — ABNORMAL LOW (ref 3.5–5.1)
Sodium: 139 mEq/L (ref 135–145)
Total Bilirubin: 0.5 mg/dL (ref 0.2–1.2)
Total Protein: 6.7 g/dL (ref 6.0–8.3)

## 2013-10-16 LAB — TSH: TSH: 0.88 u[IU]/mL (ref 0.35–4.50)

## 2013-10-16 MED ORDER — FUROSEMIDE 20 MG PO TABS: 20.0000 mg | ORAL_TABLET | Freq: Every day | ORAL | Status: DC

## 2013-10-16 NOTE — Patient Instructions (Signed)

## 2013-10-16 NOTE — Progress Notes (Signed)
Pre visit review using our clinic review tool, if applicable. No additional management support is needed unless otherwise documented below in the visit note. 

## 2013-10-16 NOTE — Progress Notes (Signed)
Subjective:    Patient ID: Kristin Merritt, female    DOB: 1961/02/18, 53 y.o.   MRN: [id number]  HPI She presents with complaints of bilateral lower leg and feet swelling for 3 weeks off and on with some improvement. She first noticed the swelling after a flight to [place]. She has tried elevation with little relief. Denies pain. She denies dietary changes or increased salt intake.    Review of Systems  Constitutional: Negative for fever, chills, diaphoresis, activity change, appetite change, fatigue and unexpected weight change.  HENT: Negative.   Eyes: Negative.   Respiratory: Negative.   Cardiovascular: Positive for leg swelling. Negative for chest pain and palpitations.  Gastrointestinal: Negative.   Endocrine: Negative.   Genitourinary: Negative.   Musculoskeletal: Negative.   Skin: Negative.   Allergic/Immunologic: Negative.   Neurological: Negative.   Hematological: Negative.   Psychiatric/Behavioral: Negative.    Past Medical History  Diagnosis Date  . Allergy   . Anxiety   . Hypertension   . Lichen sclerosus et atrophicus   . 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 Postmenopausal HRT (hormone replacement therapy) - followed by Dr. Toney Merritt in gyn 07/11/2012  . Asthma   . Uterine cancer   . Depression   . High cholesterol   . UTI (urinary tract infection)     History   Social History  . Marital Status: Widowed    Spouse Name: N/A    Number of Children: N/A  . Years of Education: N/A   Occupational History  . Not on file.   Social History Main Topics  . Smoking status: Former Smoker -- 1.00 packs/day for 20 years    Types: Cigarettes    Quit date: 06/05/2012  . Smokeless tobacco: Former Systems developer  . Alcohol Use: No  . Drug Use: No  . Sexual Activity: Yes    Birth Control/ Protection: Condom   Other Topics Concern  . 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 Eye surgery    . Abdominal hysterectomy    . Spine surgery      Family History  Problem Relation Age of Onset  . Cancer Mother     LUNG   . Breast cancer Mother     No Known Allergies  Current Outpatient Prescriptions on File Prior to Visit  Medication Sig Dispense Refill  . albuterol (PROVENTIL HFA;VENTOLIN HFA) 108 (90 BASE) MCG/ACT inhaler Inhale 2 puffs into the lungs every 6 (six) hours as needed.      Marland Kitchen aspirin 81 MG tablet Take 81 mg by mouth daily.      Marland Kitchen atorvastatin (LIPITOR) 20 MG tablet TAKE 1 TABLET BY MOUTH EVERY DAY  90 tablet  3  . diazepam (VALIUM) 5 MG tablet Use very sparingly for panic attack - not more then once in a day.  30 tablet  2  . docusate sodium (COLACE) 50 MG capsule Take by mouth 2 (two) times daily. Over the counter Equate stool softener      . estradiol (ESTRACE) 1 MG tablet Take 1 tablet (1 mg total) by mouth daily.  30 tablet  11  . fluconazole (DIFLUCAN) 150 MG tablet 1 tab a week  4 tablet  0  . Fluticasone-Salmeterol (ADVAIR) 250-50 MCG/DOSE AEPB Inhale 1 puff into the lungs as needed.      Marland Kitchen KLOR-CON M20 20 MEQ tablet TAKE 1 TABLET BY MOUTH EVERY DAY  30 tablet  3  . lansoprazole (PREVACID) 30 MG capsule TAKE 1 CAPSULE BY MOUTH ONCE DAILY  90 capsule  3  . levocetirizine (XYZAL) 5 MG tablet Take 5 mg by mouth every 12 (twelve) hours.      . metroNIDAZOLE (METROGEL VAGINAL) 0.75 % vaginal gel Use one applicator PV at bedtime for 5 days  70 g  0  . oxyCODONE-acetaminophen (ROXICET) 5-325 MG per tablet Take 1 tablet by mouth every 8 (eight) hours as needed for pain.  20 tablet  0  . zolpidem (AMBIEN) 10 MG tablet Take 10 mg by mouth at bedtime as needed for sleep.      Marland Kitchen amLODipine-olmesartan (AZOR) 5-40 MG per tablet Take 1 tablet by mouth daily.  21 tablet  0  .  ciprofloxacin (CIPRO) 250 MG tablet Take 1 tablet (250 mg total) by mouth as needed. Take after intercourse  5 tablet  1  . montelukast (SINGULAIR) 10 MG tablet Take 10 mg by mouth at bedtime.      . Olmesartan-Amlodipine-HCTZ 40-5-12.5 MG TABS Take 1 tablet by mouth daily.  30 tablet  3  . predniSONE (DELTASONE) 20 MG tablet 60mg  PO qam x 3 days, 40mg  po qam x 3 days, 20mg  qam x 3 days  18 tablet  0  . ranitidine (ZANTAC) 150 MG tablet Take 150 mg by mouth 2 (two) times daily.       No current facility-administered medications on file prior to visit.    BP 100/50  Pulse 88  Temp(Src) 98.5 F (36.9 C) (Oral)  Ht 5\' 6"  (1.676 m)  Wt 217 lb (98.431 kg)  BMI 35.04 kg/m2  SpO2 97%chart    Objective:   Physical Exam  Constitutional: She is oriented to person, place, and time. She appears well-developed and well-nourished. No distress.  HENT:  Right Ear: External ear normal.  Left Ear: External ear normal.  Nose: Nose normal.  Mouth/Throat: Oropharynx is clear and moist.  Eyes: Conjunctivae and EOM are normal. Pupils are equal, round, and reactive to light.  Neck: Normal range of motion. Neck supple.  Cardiovascular: Normal rate, regular rhythm, normal heart sounds and intact distal pulses.  Exam reveals no gallop and no friction rub.   No murmur heard. Pulmonary/Chest: Effort normal and breath sounds normal. No respiratory distress. She has no wheezes. She has no rales. She exhibits no tenderness.  Abdominal: Soft. Bowel sounds are normal. She exhibits no distension and no mass. There is no tenderness. There is no rebound and no guarding.  Musculoskeletal: Normal range of motion. She exhibits edema. She exhibits no tenderness.  2+ pitting  Neurological: She is alert and oriented to person, place, and time. She has normal reflexes.  Skin: Skin is warm and dry. She is not diaphoretic.  Psychiatric: She has a normal mood and affect. Her behavior is normal.          Assessment &  Plan:  Kristin Merritt was seen today for foot swelling.  Diagnoses and associated orders for this visit:  Peripheral edema - CMP - TSH  Unspecified essential hypertension  Other and unspecified hyperlipidemia  Other Orders - furosemide (LASIX) 20 MG tablet; Take  1 tablet (20 mg total) by mouth daily.   Call the office with any questions or concerns. Recheck in 1 week and sooner as needed.

## 2013-10-25 DIAGNOSIS — J309 Allergic rhinitis, unspecified: Secondary | ICD-10-CM | POA: Diagnosis not present

## 2013-11-07 ENCOUNTER — Ambulatory Visit (INDEPENDENT_AMBULATORY_CARE_PROVIDER_SITE_OTHER): Payer: Medicare Other | Admitting: Family

## 2013-11-07 ENCOUNTER — Encounter: Payer: Self-pay | Admitting: Family

## 2013-11-07 ENCOUNTER — Ambulatory Visit: Payer: Medicare Other | Admitting: Family

## 2013-11-07 VITALS — BP 108/64 | HR 93 | Wt 213.0 lb

## 2013-11-07 DIAGNOSIS — I10 Essential (primary) hypertension: Secondary | ICD-10-CM | POA: Diagnosis not present

## 2013-11-07 DIAGNOSIS — J309 Allergic rhinitis, unspecified: Secondary | ICD-10-CM | POA: Diagnosis not present

## 2013-11-07 DIAGNOSIS — R609 Edema, unspecified: Secondary | ICD-10-CM | POA: Diagnosis not present

## 2013-11-07 DIAGNOSIS — E785 Hyperlipidemia, unspecified: Secondary | ICD-10-CM

## 2013-11-07 DIAGNOSIS — R600 Localized edema: Secondary | ICD-10-CM

## 2013-11-07 LAB — COMPREHENSIVE METABOLIC PANEL
ALT: 23 U/L (ref 0–35)
AST: 29 U/L (ref 0–37)
Albumin: 4.2 g/dL (ref 3.5–5.2)
Alkaline Phosphatase: 49 U/L (ref 39–117)
BUN: 12 mg/dL (ref 6–23)
CALCIUM: 9.4 mg/dL (ref 8.4–10.5)
CHLORIDE: 102 meq/L (ref 96–112)
CO2: 28 meq/L (ref 19–32)
Creatinine, Ser: 0.9 mg/dL (ref 0.4–1.2)
GFR: 86.51 mL/min (ref >60.00)
Glucose, Bld: 112 mg/dL — ABNORMAL HIGH (ref 70–99)
Potassium: 3.7 mEq/L (ref 3.5–5.1)
Sodium: 139 mEq/L (ref 135–145)
TOTAL PROTEIN: 6.7 g/dL (ref 6.0–8.3)
Total Bilirubin: 0.3 mg/dL (ref 0.2–1.2)

## 2013-11-07 MED ORDER — FUROSEMIDE 20 MG PO TABS: 20.0000 mg | ORAL_TABLET | Freq: Every day | ORAL | Status: DC

## 2013-11-07 NOTE — Patient Instructions (Signed)
2 Gram Low Sodium Diet A 2 gram sodium diet restricts the amount of sodium in the diet to no more than 2 g or 2000 mg daily. Limiting the amount of sodium is often used to help lower blood pressure. It is important if you have heart, liver, or kidney problems. Many foods contain sodium for flavor and sometimes as a preservative. When the amount of sodium in a diet needs to be low, it is important to know what to look for when choosing foods and drinks. The following includes some information and guidelines to help make it easier for you to adapt to a low sodium diet. QUICK TIPS  Do not add salt to food.  Avoid convenience items and fast food.  Choose unsalted snack foods.  Buy lower sodium products, often labeled as "lower sodium" or "no salt added."  Check food labels to learn how much sodium is in 1 serving.  When eating at a restaurant, ask that your food be prepared with less salt or none, if possible. READING FOOD LABELS FOR SODIUM INFORMATION The nutrition facts label is a good place to find how much sodium is in foods. Look for products with no more than 500 to 600 mg of sodium per meal and no more than 150 mg per serving. Remember that 2 g = 2000 mg. The food label may also list foods as:  Sodium-free: Less than 5 mg in a serving.  Very low sodium: 35 mg or less in a serving.  Low-sodium: 140 mg or less in a serving.  Light in sodium: 50% less sodium in a serving. For example, if a food that usually has 300 mg of sodium is changed to become light in sodium, it will have 150 mg of sodium.  Reduced sodium: 25% less sodium in a serving. For example, if a food that usually has 400 mg of sodium is changed to reduced sodium, it will have 300 mg of sodium. CHOOSING FOODS Grains  Avoid: Salted crackers and snack items. Some cereals, including instant hot cereals. Bread stuffing and biscuit mixes. Seasoned rice or pasta mixes.  Choose: Unsalted snack items. Low-sodium cereals, oats,  puffed wheat and rice, shredded wheat. English muffins and bread. Pasta. Meats  Avoid: Salted, canned, smoked, spiced, pickled meats, including fish and poultry. Bacon, ham, sausage, cold cuts, hot dogs, anchovies.  Choose: Low-sodium canned tuna and salmon. Fresh or frozen meat, poultry, and fish. Dairy  Avoid: Processed cheese and spreads. Cottage cheese. Buttermilk and condensed milk. Regular cheese.  Choose: Milk. Low-sodium cottage cheese. Yogurt. Sour cream. Low-sodium cheese. Fruits and Vegetables  Avoid: Regular canned vegetables. Regular canned tomato sauce and paste. Frozen vegetables in sauces. Olives. Pickles. Relishes. Sauerkraut.  Choose: Low-sodium canned vegetables. Low-sodium tomato sauce and paste. Frozen or fresh vegetables. Fresh and frozen fruit. Condiments  Avoid: Canned and packaged gravies. Worcestershire sauce. Tartar sauce. Barbecue sauce. Soy sauce. Steak sauce. Ketchup. Onion, garlic, and table salt. Meat flavorings and tenderizers.  Choose: Fresh and dried herbs and spices. Low-sodium varieties of mustard and ketchup. Lemon juice. Tabasco sauce. Horseradish. SAMPLE 2 GRAM SODIUM MEAL PLAN Breakfast / Sodium (mg)  1 cup low-fat milk / 143 mg  2 slices whole-wheat toast / 270 mg  1 tbs heart-healthy margarine / 153 mg  1 hard-boiled egg / 139 mg  1 small orange / 0 mg Lunch / Sodium (mg)  1 cup raw carrots / 76 mg   cup hummus / 298 mg  1 cup low-fat milk /   143 mg   cup red grapes / 2 mg  1 whole-wheat pita bread / 356 mg Dinner / Sodium (mg)  1 cup whole-wheat pasta / 2 mg  1 cup low-sodium tomato sauce / 73 mg  3 oz lean ground beef / 57 mg  1 small side salad (1 cup raw spinach leaves,  cup cucumber,  cup yellow bell pepper) with 1 tsp olive oil and 1 tsp red wine vinegar / 25 mg Snack / Sodium (mg)  1 container low-fat vanilla yogurt / 107 mg  3 graham cracker squares / 127 mg Nutrient Analysis  Calories: 2033  Protein:  77 g  Carbohydrate: 282 g  Fat: 72 g  Sodium: 1971 mg Document Released: 05/10/2005 Document Revised: 08/02/2011 Document Reviewed: 08/11/2009 [employer] Patient Information 2014 [employer].  

## 2013-11-07 NOTE — Progress Notes (Signed)
Pre visit review using our clinic review tool, if applicable. No additional management support is needed unless otherwise documented below in the visit note. 

## 2013-11-07 NOTE — Progress Notes (Signed)
Subjective:    Patient ID: Kristin Merritt, female    DOB: 1960-08-10, 53 y.o.   MRN: [id number]  HPI  53 yo nonsmoking African American female returns to clinic after a 3-week course of Lasix for peripheral edema. She reports swelling is relieved after Lasix, however it continues to persists. It is not worse than when she was seen last. Specifically denies chest pain, dyspnea, fatigue, abdominal distention. She has irregular palpitations, which is not new.   24-diet recall revealed significant sodium intake. She eats pork usually twice each day.   Her PMH is complicated by HTN, hyperlipidemia, asthma, chronic low back pain, postmenopausal HRT.   BP medications olmesartan-amlodipine-HCTZ  Daily and Lasix 20 mg PRN for swelling.     Review of Systems  Constitutional: Negative.   HENT: Negative.   Eyes: Negative.   Respiratory: Negative.   Cardiovascular: Positive for palpitations and leg swelling. Negative for chest pain.       Infrequent palpitations since last visit. Not with activity. One cup of caffeine/day.   Bilateral lower leg/feet swelling, intermittent.    Past Medical History  Diagnosis Date  . Allergy   . Anxiety   . Hypertension   . Lichen sclerosus et atrophicus   . 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 Postmenopausal HRT (hormone replacement therapy) - followed by Dr. Toney Rakes in gyn 07/11/2012  . Asthma   . Uterine cancer   . Depression   . High cholesterol   . UTI (urinary tract infection)     History   Social History  . Marital Status: Widowed    Spouse Name: N/A    Number of Children: N/A  . Years of Education: N/A   Occupational History  . Not on file.   Social History Main Topics  . Smoking status: Former Smoker -- 1.00 packs/day for 20 years    Types: Cigarettes    Quit date: 06/05/2012  . Smokeless tobacco: Former Systems developer  . Alcohol Use: No  . Drug Use: No  . Sexual Activity: Yes    Birth Control/  Protection: Condom   Other Topics Concern  . 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 Eye surgery    . Abdominal hysterectomy    . Spine surgery      Family History  Problem Relation Age of Onset  . Cancer Mother     LUNG   . Breast cancer Mother     No Known Allergies  Current Outpatient Prescriptions on File Prior to Visit  Medication Sig Dispense Refill  . albuterol (PROVENTIL HFA;VENTOLIN HFA) 108 (90 BASE) MCG/ACT inhaler Inhale 2 puffs into the lungs every 6 (six) hours as needed.      Marland Kitchen amLODipine-olmesartan (AZOR) 5-40 MG per tablet Take 1 tablet by mouth daily.  21 tablet  0  . aspirin 81 MG tablet Take 81 mg by mouth daily.      Marland Kitchen atorvastatin (LIPITOR) 20 MG tablet TAKE 1 TABLET BY MOUTH EVERY DAY  90 tablet  3  . diazepam (VALIUM) 5 MG tablet Use very sparingly for panic attack -  not more then once in a day.  30 tablet  2  . docusate sodium (COLACE) 50 MG capsule Take by mouth 2 (two) times daily. Over the counter Equate stool softener      . estradiol (ESTRACE) 1 MG tablet Take 1 tablet (1 mg total) by mouth daily.  30 tablet  11  . Fluticasone-Salmeterol (ADVAIR) 250-50 MCG/DOSE AEPB Inhale 1 puff into the lungs as needed.      Marland Kitchen KLOR-CON M20 20 MEQ tablet TAKE 1 TABLET BY MOUTH EVERY DAY  30 tablet  3  . lansoprazole (PREVACID) 30 MG capsule TAKE 1 CAPSULE BY MOUTH ONCE DAILY  90 capsule  3  . levocetirizine (XYZAL) 5 MG tablet Take 5 mg by mouth every 12 (twelve) hours.      . montelukast (SINGULAIR) 10 MG tablet Take 10 mg by mouth at bedtime.      . Olmesartan-Amlodipine-HCTZ 40-5-12.5 MG TABS Take 1 tablet by mouth daily.  30 tablet  3  . oxyCODONE-acetaminophen (ROXICET) 5-325 MG per tablet Take 1 tablet by mouth every  8 (eight) hours as needed for pain.  20 tablet  0  . predniSONE (DELTASONE) 20 MG tablet 60mg  PO qam x 3 days, 40mg  po qam x 3 days, 20mg  qam x 3 days  18 tablet  0  . ranitidine (ZANTAC) 150 MG tablet Take 150 mg by mouth 2 (two) times daily.      Marland Kitchen zolpidem (AMBIEN) 10 MG tablet Take 10 mg by mouth at bedtime as needed for sleep.       No current facility-administered medications on file prior to visit.    BP 108/64  Pulse 93  Wt 213 lb (96.616 kg)  SpO2 97%    Objective:   Physical Exam  Constitutional: She is oriented to person, place, and time. She appears well-developed and well-nourished.  HENT:  Head: Normocephalic and atraumatic.  Neck: Normal range of motion. Neck supple. No JVD present. No thyromegaly present.  Cardiovascular: Normal rate, regular rhythm, normal heart sounds and intact distal pulses.  Exam reveals no gallop and no friction rub.   No murmur heard. Pulmonary/Chest: Effort normal and breath sounds normal. No respiratory distress.  Abdominal: Soft. She exhibits no distension.  Musculoskeletal: Normal range of motion. She exhibits no edema.  No tibial or feet edema noted.   Neurological: She is alert and oriented to person, place, and time.  Skin: Skin is warm and dry. She is not diaphoretic.  Psychiatric: She has a normal mood and affect. Her behavior is normal. Judgment and thought content normal.      Assessment & Plan:  Kristin Merritt was seen today for follow-up.  Diagnoses and associated orders for this visit:  Peripheral edema - furosemide (LASIX) 20 MG tablet; Take 1 tablet (20 mg total) by mouth daily. - CMP  Other and unspecified hyperlipidemia  Unspecified essential hypertension  Other Orders - furosemide (LASIX) 20 MG tablet; Take 1 tablet (20 mg total) by mouth daily.   Call the office with any questions or concerns. Recheck as scheduled and as needed.

## 2013-11-13 ENCOUNTER — Ambulatory Visit: Payer: Medicare Other | Admitting: Family

## 2013-11-19 DIAGNOSIS — J309 Allergic rhinitis, unspecified: Secondary | ICD-10-CM | POA: Diagnosis not present

## 2013-11-30 DIAGNOSIS — J309 Allergic rhinitis, unspecified: Secondary | ICD-10-CM | POA: Diagnosis not present

## 2013-12-12 ENCOUNTER — Other Ambulatory Visit: Payer: Self-pay | Admitting: Family Medicine

## 2013-12-14 DIAGNOSIS — J309 Allergic rhinitis, unspecified: Secondary | ICD-10-CM | POA: Diagnosis not present

## 2013-12-17 ENCOUNTER — Ambulatory Visit: Payer: Medicare Other | Admitting: Family

## 2013-12-19 ENCOUNTER — Ambulatory Visit (INDEPENDENT_AMBULATORY_CARE_PROVIDER_SITE_OTHER): Payer: Medicare Other | Admitting: Family

## 2013-12-19 ENCOUNTER — Encounter: Payer: Self-pay | Admitting: Family

## 2013-12-19 VITALS — BP 116/74 | HR 94 | Temp 98.9°F | Ht 66.0 in | Wt 213.0 lb

## 2013-12-19 DIAGNOSIS — B9689 Other specified bacterial agents as the cause of diseases classified elsewhere: Secondary | ICD-10-CM

## 2013-12-19 DIAGNOSIS — N760 Acute vaginitis: Secondary | ICD-10-CM | POA: Diagnosis not present

## 2013-12-19 DIAGNOSIS — E7800 Pure hypercholesterolemia, unspecified: Secondary | ICD-10-CM | POA: Diagnosis not present

## 2013-12-19 DIAGNOSIS — I10 Essential (primary) hypertension: Secondary | ICD-10-CM

## 2013-12-19 DIAGNOSIS — F411 Generalized anxiety disorder: Secondary | ICD-10-CM

## 2013-12-19 DIAGNOSIS — A499 Bacterial infection, unspecified: Secondary | ICD-10-CM

## 2013-12-19 DIAGNOSIS — J309 Allergic rhinitis, unspecified: Secondary | ICD-10-CM | POA: Diagnosis not present

## 2013-12-19 MED ORDER — METRONIDAZOLE 500 MG PO TABS: 500.0000 mg | ORAL_TABLET | Freq: Two times a day (BID) | ORAL | Status: DC

## 2013-12-19 NOTE — Progress Notes (Signed)
Pre visit review using our clinic review tool, if applicable. No additional management support is needed unless otherwise documented below in the visit note. 

## 2013-12-19 NOTE — Patient Instructions (Signed)
Bacterial Vaginosis Bacterial vaginosis is a vaginal infection that occurs when the normal balance of bacteria in the vagina is disrupted. It results from an overgrowth of certain bacteria. This is the most common vaginal infection in women of childbearing age. Treatment is important to prevent complications, especially in pregnant women, as it can cause a premature delivery. CAUSES  Bacterial vaginosis is caused by an increase in harmful bacteria that are normally present in smaller amounts in the vagina. Several different kinds of bacteria can cause bacterial vaginosis. However, the reason that the condition develops is not fully understood. RISK FACTORS Certain activities or behaviors can put you at an increased risk of developing bacterial vaginosis, including:  Having a new sex partner or multiple sex partners.  Douching.  Using an intrauterine device (IUD) for contraception. Women do not get bacterial vaginosis from toilet seats, bedding, swimming pools, or contact with objects around them. SIGNS AND SYMPTOMS  Some women with bacterial vaginosis have no signs or symptoms. Common symptoms include:  Grey vaginal discharge.  A fishlike odor with discharge, especially after sexual intercourse.  Itching or burning of the vagina and vulva.  Burning or pain with urination. DIAGNOSIS  Your health care provider will take a medical history and examine the vagina for signs of bacterial vaginosis. A sample of vaginal fluid may be taken. Your health care provider will look at this sample under a microscope to check for bacteria and abnormal cells. A vaginal pH test may also be done.  TREATMENT  Bacterial vaginosis may be treated with antibiotic medicines. These may be given in the form of a pill or a vaginal cream. A second round of antibiotics may be prescribed if the condition comes back after treatment.  HOME CARE INSTRUCTIONS   Only take over-the-counter or prescription medicines as  directed by your health care provider.  If antibiotic medicine was prescribed, take it as directed. Make sure you finish it even if you start to feel better.  Do not have sex until treatment is completed.  Tell all sexual partners that you have a vaginal infection. They should see their health care provider and be treated if they have problems, such as a mild rash or itching.  Practice safe sex by using condoms and only having one sex partner. SEEK MEDICAL CARE IF:   Your symptoms are not improving after 3 days of treatment.  You have increased discharge or pain.  You have a fever. MAKE SURE YOU:   Understand these instructions.  Will watch your condition.  Will get help right away if you are not doing well or get worse. FOR MORE INFORMATION  Centers for Disease Control and Prevention, Division of STD Prevention: www.cdc.gov/std American Sexual Health Association (ASHA): www.ashastd.org  Document Released: 05/10/2005 Document Revised: 02/28/2013 Document Reviewed: 12/20/2012 [employer] Patient Information 2015 [employer]. This information is not intended to replace advice given to you by your health care provider. Make sure you discuss any questions you have with your health care provider.  

## 2013-12-20 ENCOUNTER — Encounter: Payer: Self-pay | Admitting: Family

## 2013-12-20 NOTE — Progress Notes (Signed)
Subjective:    Patient ID: Kristin Merritt, female    DOB: [birth year], 53 y.o.   MRN: [id number]  HPI 53 year old [place] American female, nonsmoker is in today for recheck of hypertension, hyperlipidemia, anxiety. Reports in stable. Denies any concerns and tolerating all medications well. She is requesting a refill on Flagyl for bacterial vaginosis. Reports having a musty discharge. She's not sexually active. No concerns sexually transmitted diseases.   Review of Systems  Constitutional: Negative.   HENT: Negative.   Respiratory: Negative.   Cardiovascular: Negative.   Endocrine: Negative.   Genitourinary: Positive for vaginal discharge. Negative for vaginal pain.  Musculoskeletal: Negative.   Skin: Negative.   Allergic/Immunologic: Negative.   Neurological: Negative.   Psychiatric/Behavioral: Negative.    Past Medical History  Diagnosis Date  . Allergy   . Anxiety   . Hypertension   . Lichen sclerosus et atrophicus   . 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 Postmenopausal HRT (hormone replacement therapy) - followed by Dr. Toney Rakes in gyn 07/11/2012  . Asthma   . Uterine cancer   . Depression   . High cholesterol   . UTI (urinary tract infection)     History   Social History  . Marital Status: Widowed    Spouse Name: N/A    Number of Children: N/A  . Years of Education: N/A   Occupational History  . Not on file.   Social History Main Topics  . Smoking status: Former Smoker -- 1.00 packs/day for 20 years    Types: Cigarettes    Quit date: 06/05/2012  . Smokeless tobacco: Former Systems developer  . Alcohol Use: No  . Drug Use: No  . Sexual Activity: Yes    Birth Control/ Protection: Condom   Other Topics Concern  . Not on file   Social History Narrative   Work or School: retired Oncologist Situation: lives alone       Spiritual Beliefs: Christain      Lifestyle: getting back into exercising and eating well            Past Surgical History  Procedure Laterality Date  . Eye surgery    . Abdominal hysterectomy    . Spine surgery      Family History  Problem Relation Age of Onset  . Cancer Mother     LUNG   . Breast cancer Mother     No Known Allergies  Current Outpatient Prescriptions on File Prior to Visit  Medication Sig Dispense Refill  . albuterol (PROVENTIL HFA;VENTOLIN HFA) 108 (90 BASE) MCG/ACT inhaler Inhale 2 puffs into the lungs every 6 (six) hours as needed.      Marland Kitchen aspirin 81 MG tablet Take 81 mg by mouth daily.      Marland Kitchen atorvastatin (LIPITOR) 20 MG tablet TAKE 1 TABLET BY MOUTH EVERY DAY  90 tablet  3  . diazepam (VALIUM) 5 MG tablet Use very sparingly for panic attack - not more then once in a day.  30 tablet  2  . docusate sodium (COLACE) 50 MG capsule Take by mouth 2 (two) times daily. Over the counter Equate stool softener      . estradiol (ESTRACE) 1 MG tablet Take 1 tablet (1 mg total) by mouth daily.  30 tablet  11  . Fluticasone-Salmeterol (ADVAIR) 250-50 MCG/DOSE AEPB Inhale 1 puff into the lungs as needed.      Marland Kitchen KLOR-CON  M20 20 MEQ tablet TAKE 1 TABLET BY MOUTH EVERY DAY  30 tablet  3  . lansoprazole (PREVACID) 30 MG capsule TAKE 1 CAPSULE BY MOUTH ONCE DAILY  90 capsule  3  . levocetirizine (XYZAL) 5 MG tablet Take 5 mg by mouth every 12 (twelve) hours.      . montelukast (SINGULAIR) 10 MG tablet Take 10 mg by mouth at bedtime.      . Olmesartan-Amlodipine-HCTZ 40-5-12.5 MG TABS Take 1 tablet by mouth daily.  30 tablet  3  . oxyCODONE-acetaminophen (ROXICET) 5-325 MG per tablet Take 1 tablet by mouth every 8 (eight) hours as needed for pain.  20 tablet  0  . ranitidine (ZANTAC) 150 MG tablet Take 150 mg by mouth 2 (two) times daily.      Marland Kitchen zolpidem (AMBIEN) 10 MG tablet Take 10 mg by mouth at bedtime as needed for sleep.      . furosemide (LASIX) 20 MG tablet Take 1 tablet (20 mg total) by mouth daily.  30 tablet  0  . furosemide (LASIX) 20 MG tablet Take 1 tablet  (20 mg total) by mouth daily.  90 tablet  0   No current facility-administered medications on file prior to visit.    BP 116/74  Pulse 94  Temp(Src) 98.9 F (37.2 C) (Oral)  Ht 5\' 6"  (1.676 m)  Wt 213 lb (96.616 kg)  BMI 34.40 kg/m2chart    Objective:   Physical Exam  Constitutional: She is oriented to person, place, and time. She appears well-developed and well-nourished.  HENT:  Right Ear: External ear normal.  Left Ear: External ear normal.  Nose: Nose normal.  Mouth/Throat: Oropharynx is clear and moist.  Neck: Normal range of motion. Neck supple.  Cardiovascular: Normal rate, regular rhythm and normal heart sounds.   Pulmonary/Chest: Effort normal and breath sounds normal.  Abdominal: Soft. Bowel sounds are normal.  Musculoskeletal: Normal range of motion.  Neurological: She is alert and oriented to person, place, and time.  Skin: Skin is warm and dry.  Psychiatric: She has a normal mood and affect.          Assessment & Plan:  Kristin Merritt was seen today for medication management.  Diagnoses and associated orders for this visit:  Unspecified essential hypertension  Generalized anxiety disorder  Pure hypercholesterolemia  Bacterial vaginosis  Other Orders - metroNIDAZOLE (FLAGYL) 500 MG tablet; Take 1 tablet (500 mg total) by mouth 2 (two) times daily.     Call the office with any questions or concerns. Recheck as scheduled and as needed.

## 2013-12-28 DIAGNOSIS — J309 Allergic rhinitis, unspecified: Secondary | ICD-10-CM | POA: Diagnosis not present

## 2014-01-02 DIAGNOSIS — J309 Allergic rhinitis, unspecified: Secondary | ICD-10-CM | POA: Diagnosis not present

## 2014-01-11 DIAGNOSIS — J309 Allergic rhinitis, unspecified: Secondary | ICD-10-CM | POA: Diagnosis not present

## 2014-01-17 ENCOUNTER — Other Ambulatory Visit: Payer: Self-pay | Admitting: Family

## 2014-01-23 DIAGNOSIS — J309 Allergic rhinitis, unspecified: Secondary | ICD-10-CM | POA: Diagnosis not present

## 2014-01-28 ENCOUNTER — Other Ambulatory Visit: Payer: Self-pay | Admitting: Gynecology

## 2014-02-01 DIAGNOSIS — J309 Allergic rhinitis, unspecified: Secondary | ICD-10-CM | POA: Diagnosis not present

## 2014-02-07 ENCOUNTER — Ambulatory Visit: Payer: Medicare Other | Admitting: Family

## 2014-02-08 ENCOUNTER — Ambulatory Visit: Payer: Medicare Other | Admitting: Family

## 2014-02-13 ENCOUNTER — Ambulatory Visit (INDEPENDENT_AMBULATORY_CARE_PROVIDER_SITE_OTHER): Payer: Medicare Other | Admitting: Family

## 2014-02-13 ENCOUNTER — Encounter: Payer: Self-pay | Admitting: Family

## 2014-02-13 VITALS — BP 110/70 | HR 83 | Wt 203.0 lb

## 2014-02-13 DIAGNOSIS — I10 Essential (primary) hypertension: Secondary | ICD-10-CM | POA: Diagnosis not present

## 2014-02-13 DIAGNOSIS — Z1239 Encounter for other screening for malignant neoplasm of breast: Secondary | ICD-10-CM

## 2014-02-13 DIAGNOSIS — IMO0001 Reserved for inherently not codable concepts without codable children: Secondary | ICD-10-CM

## 2014-02-13 DIAGNOSIS — J4489 Other specified chronic obstructive pulmonary disease: Secondary | ICD-10-CM

## 2014-02-13 DIAGNOSIS — E7800 Pure hypercholesterolemia, unspecified: Secondary | ICD-10-CM | POA: Diagnosis not present

## 2014-02-13 DIAGNOSIS — Z23 Encounter for immunization: Secondary | ICD-10-CM

## 2014-02-13 DIAGNOSIS — J449 Chronic obstructive pulmonary disease, unspecified: Secondary | ICD-10-CM

## 2014-02-13 DIAGNOSIS — Z1231 Encounter for screening mammogram for malignant neoplasm of breast: Secondary | ICD-10-CM

## 2014-02-13 MED ORDER — FLUTICASONE PROPIONATE 50 MCG/ACT NA SUSP: 2.0000 | Freq: Every day | NASAL | Status: DC

## 2014-02-13 NOTE — Progress Notes (Signed)
Subjective:    Patient ID: Kristin Merritt, female    DOB: June 06, 1960, 54 y.o.   MRN: [id number]  HPI 53 year old African American female, smoker, with a history of chronic bronchitis, hypertension, obesity, and allergic rhinitis presents today for recheck. Currently stable on medications. She is down 14 pounds from her last office visit due to exercise. Has complaints of nasal congestion although she's taken Singulair and Xyzal the bedtime. She also receives allergy injections.   Review of Systems  Constitutional: Negative.   HENT: Positive for congestion and sinus pressure. Negative for postnasal drip, rhinorrhea, sneezing and sore throat.   Respiratory: Negative.   Cardiovascular: Negative.   Gastrointestinal: Negative.   Endocrine: Negative.   Genitourinary: Negative.   Musculoskeletal: Negative.   Skin: Negative.   Allergic/Immunologic: Positive for environmental allergies.  Hematological: Negative.   Psychiatric/Behavioral: Negative.    Past Medical History  Diagnosis Date  . Allergy   . Anxiety   . Hypertension   . Lichen sclerosus et atrophicus   . 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 Postmenopausal HRT (hormone replacement therapy) - followed by Dr. Toney Rakes in gyn 07/11/2012  . Asthma   . Uterine cancer   . Depression   . High cholesterol   . UTI (urinary tract infection)     History   Social History  . Marital Status: Widowed    Spouse Name: N/A    Number of Children: N/A  . Years of Education: N/A   Occupational History  . Not on file.   Social History Main Topics  . Smoking status: Former Smoker -- 1.00 packs/day for 20 years    Types: Cigarettes    Quit date: 06/05/2012  . Smokeless tobacco: Former Systems developer  . Alcohol Use: No  . Drug Use: No  . Sexual Activity: Yes    Birth Control/ Protection: Condom   Other Topics Concern  . Not on file   Social History Narrative   Work or School: retired Designer, multimedia Situation: lives alone       Spiritual Beliefs: Christain      Lifestyle: getting back into exercising and eating well             Past Surgical History  Procedure Laterality Date  . Eye surgery    . Abdominal hysterectomy    . Spine surgery      Family History  Problem Relation Age of Onset  . Cancer Mother     LUNG   . Breast cancer Mother     No Known Allergies  Current Outpatient Prescriptions on File Prior to Visit  Medication Sig Dispense Refill  . albuterol (PROVENTIL HFA;VENTOLIN HFA) 108 (90 BASE) MCG/ACT inhaler Inhale 2 puffs into the lungs every 6 (six) hours as needed.      Marland Kitchen aspirin 81 MG tablet Take 81 mg by mouth daily.      Marland Kitchen atorvastatin (LIPITOR) 20 MG tablet TAKE 1 TABLET BY MOUTH EVERY DAY  90 tablet  3  . diazepam (VALIUM) 5 MG tablet Use very sparingly for panic attack - not more then once in a day.  30 tablet  2  . docusate sodium (COLACE) 50 MG capsule Take by mouth 2 (two) times daily. Over the counter Equate stool softener      . estradiol (ESTRACE) 1 MG tablet TAKE 1 TABLET EVERY DAY  30 tablet  0  . Fluticasone-Salmeterol (ADVAIR) 250-50 MCG/DOSE  AEPB Inhale 1 puff into the lungs as needed.      . furosemide (LASIX) 20 MG tablet Take 1 tablet (20 mg total) by mouth daily.  90 tablet  0  . KLOR-CON M20 20 MEQ tablet TAKE 1 TABLET BY MOUTH EVERY DAY  30 tablet  3  . lansoprazole (PREVACID) 30 MG capsule TAKE 1 CAPSULE BY MOUTH ONCE DAILY  90 capsule  3  . levocetirizine (XYZAL) 5 MG tablet Take 5 mg by mouth every 12 (twelve) hours.      . montelukast (SINGULAIR) 10 MG tablet Take 10 mg by mouth at bedtime.      . Olmesartan-Amlodipine-HCTZ 40-5-12.5 MG TABS Take 1 tablet by mouth daily.  30 tablet  3  . oxyCODONE-acetaminophen (ROXICET) 5-325 MG per tablet Take 1 tablet by mouth every 8 (eight) hours as needed for pain.  20 tablet  0  . ranitidine (ZANTAC) 150 MG tablet Take 150 mg by mouth 2 (two) times daily.      Marland Kitchen zolpidem (AMBIEN) 10  MG tablet Take 10 mg by mouth at bedtime as needed for sleep.      . furosemide (LASIX) 20 MG tablet Take 1 tablet (20 mg total) by mouth daily.  30 tablet  0   No current facility-administered medications on file prior to visit.    BP 110/70  Pulse 83  Wt 203 lb (92.08 kg)chart    Objective:   Physical Exam  Constitutional: She is oriented to person, place, and time. She appears well-developed and well-nourished.  HENT:  Right Ear: External ear normal.  Left Ear: External ear normal.  Nose: Nose normal.  Mouth/Throat: Oropharynx is clear and moist.  Nasal turbinated edematous.   Neck: Normal range of motion. Neck supple. No thyromegaly present.  Cardiovascular: Normal rate, regular rhythm and normal heart sounds.   Pulmonary/Chest: Effort normal and breath sounds normal.  Abdominal: Soft. Bowel sounds are normal.  Musculoskeletal: Normal range of motion.  Neurological: She is alert and oriented to person, place, and time.  Skin: Skin is warm and dry.  Psychiatric: She has a normal mood and affect.          Assessment & Plan:  Kristin Merritt was seen today for follow-up.  Diagnoses and associated orders for this visit:  Need for prophylactic vaccination and inoculation against influenza - Flu Vaccine QUAD 36+ mos PF IM (Fluarix Quad PF) - Lipid Panel - CMP - CBC with Differential  Unspecified essential hypertension - Lipid Panel - CMP - CBC with Differential  Pure hypercholesterolemia - Lipid Panel - CMP - CBC with Differential  COPD bronchitis - Lipid Panel - CMP - CBC with Differential  Other Orders - fluticasone (FLONASE) 50 MCG/ACT nasal spray; Place 2 sprays into both nostrils daily.    called the office with any questions or concerns. Recheck in 4 months or sooner as needed.

## 2014-02-13 NOTE — Patient Instructions (Signed)
Exercise to Stay Healthy Exercise helps you become and stay healthy. EXERCISE IDEAS AND TIPS Choose exercises that:  You enjoy.  Fit into your day. You do not need to exercise really hard to be healthy. You can do exercises at a slow or medium level and stay healthy. You can:  Stretch before and after working out.  Try yoga, Pilates, or tai chi.  Lift weights.  Walk fast, swim, jog, run, climb stairs, bicycle, dance, or rollerskate.  Take aerobic classes. Exercises that burn about 150 calories:  Running 1  miles in 15 minutes.  Playing volleyball for 45 to 60 minutes.  Washing and waxing a car for 45 to 60 minutes.  Playing touch football for 45 minutes.  Walking 1  miles in 35 minutes.  Pushing a stroller 1  miles in 30 minutes.  Playing basketball for 30 minutes.  Raking leaves for 30 minutes.  Bicycling 5 miles in 30 minutes.  Walking 2 miles in 30 minutes.  Dancing for 30 minutes.  Shoveling snow for 15 minutes.  Swimming laps for 20 minutes.  Walking up stairs for 15 minutes.  Bicycling 4 miles in 15 minutes.  Gardening for 30 to 45 minutes.  Jumping rope for 15 minutes.  Washing windows or floors for 45 to 60 minutes. Document Released: 06/12/2010 Document Revised: 08/02/2011 Document Reviewed: 06/12/2010 Exit[employer] Patient Information 2015 [employer]. This information is not intended to replace advice given to you by your health care provider. Make sure you discuss any questions you have with your health care provider.  

## 2014-02-13 NOTE — Progress Notes (Signed)
Pre visit review using our clinic review tool, if applicable. No additional management support is needed unless otherwise documented below in the visit note. 

## 2014-02-15 ENCOUNTER — Ambulatory Visit: Payer: Medicare Other | Admitting: Family

## 2014-02-15 DIAGNOSIS — J309 Allergic rhinitis, unspecified: Secondary | ICD-10-CM | POA: Diagnosis not present

## 2014-02-15 LAB — COMPREHENSIVE METABOLIC PANEL
ALT: 22 U/L (ref 0–35)
AST: 28 U/L (ref 0–37)
Albumin: 4.3 g/dL (ref 3.5–5.2)
Alkaline Phosphatase: 59 U/L (ref 39–117)
BILIRUBIN TOTAL: 0.5 mg/dL (ref 0.2–1.2)
BUN: 10 mg/dL (ref 6–23)
CHLORIDE: 100 meq/L (ref 96–112)
CO2: 30 mEq/L (ref 19–32)
Calcium: 9.3 mg/dL (ref 8.4–10.5)
Creatinine, Ser: 0.9 mg/dL (ref 0.4–1.2)
GFR: 89.95 mL/min (ref >60.00)
Glucose, Bld: 106 mg/dL — ABNORMAL HIGH (ref 70–99)
Potassium: 3.4 mEq/L — ABNORMAL LOW (ref 3.5–5.1)
SODIUM: 136 meq/L (ref 135–145)
Total Protein: 7.1 g/dL (ref 6.0–8.3)

## 2014-02-15 LAB — CBC WITH DIFFERENTIAL/PLATELET
BASOS ABS: 0 10*3/uL (ref 0.0–0.1)
Basophils Relative: 0.3 % (ref 0.0–3.0)
EOS ABS: 0.1 10*3/uL (ref 0.0–0.7)
Eosinophils Relative: 1 % (ref 0.0–5.0)
HEMATOCRIT: 38.4 % (ref 36.0–46.0)
Hemoglobin: 12.5 g/dL (ref 12.0–15.0)
LYMPHS ABS: 3 10*3/uL (ref 0.7–4.0)
Lymphocytes Relative: 34.8 % (ref 12.0–46.0)
MCHC: 32.5 g/dL (ref 30.0–36.0)
MCV: 81.4 fl (ref 78.0–100.0)
MONO ABS: 0.7 10*3/uL (ref 0.1–1.0)
Monocytes Relative: 7.6 % (ref 3.0–12.0)
NEUTROS ABS: 4.8 10*3/uL (ref 1.4–7.7)
Neutrophils Relative %: 56.3 % (ref 43.0–77.0)
Platelets: 158 10*3/uL (ref 150.0–400.0)
RBC: 4.72 Mil/uL (ref 3.87–5.11)
RDW: 15.2 % (ref 11.5–15.5)
WBC: 8.5 10*3/uL (ref 4.0–10.5)

## 2014-02-15 LAB — LIPID PANEL
Cholesterol: 158 mg/dL (ref 0–200)
HDL: 61.9 mg/dL (ref >39.00)
LDL Cholesterol: 71 mg/dL (ref 0–99)
NONHDL: 96.1
Total CHOL/HDL Ratio: 3
Triglycerides: 128 mg/dL (ref 0.0–149.0)
VLDL: 25.6 mg/dL (ref 0.0–40.0)

## 2014-02-22 DIAGNOSIS — J3089 Other allergic rhinitis: Secondary | ICD-10-CM | POA: Diagnosis not present

## 2014-02-25 DIAGNOSIS — J3089 Other allergic rhinitis: Secondary | ICD-10-CM | POA: Diagnosis not present

## 2014-02-28 ENCOUNTER — Other Ambulatory Visit: Payer: Self-pay | Admitting: Gynecology

## 2014-03-15 DIAGNOSIS — J3089 Other allergic rhinitis: Secondary | ICD-10-CM | POA: Diagnosis not present

## 2014-03-22 ENCOUNTER — Other Ambulatory Visit: Payer: Self-pay | Admitting: Family

## 2014-03-22 ENCOUNTER — Ambulatory Visit (HOSPITAL_COMMUNITY)
Admission: RE | Admit: 2014-03-22 | Discharge: 2014-03-22 | Disposition: A | Discharge status: Home / Self Care | Payer: Medicare Other | Source: Ambulatory Visit | Attending: Family | Admitting: Family

## 2014-03-22 DIAGNOSIS — Z1231 Encounter for screening mammogram for malignant neoplasm of breast: Secondary | ICD-10-CM

## 2014-03-22 DIAGNOSIS — Z1239 Encounter for other screening for malignant neoplasm of breast: Secondary | ICD-10-CM

## 2014-03-22 DIAGNOSIS — J3089 Other allergic rhinitis: Secondary | ICD-10-CM | POA: Diagnosis not present

## 2014-03-25 ENCOUNTER — Encounter: Payer: Self-pay | Admitting: Family

## 2014-03-26 ENCOUNTER — Ambulatory Visit (AMBULATORY_SURGERY_CENTER): Payer: Self-pay

## 2014-03-26 VITALS — Ht 66.0 in | Wt 210.0 lb

## 2014-03-26 DIAGNOSIS — Z1211 Encounter for screening for malignant neoplasm of colon: Secondary | ICD-10-CM

## 2014-03-26 DIAGNOSIS — J3089 Other allergic rhinitis: Secondary | ICD-10-CM | POA: Diagnosis not present

## 2014-03-26 NOTE — Progress Notes (Signed)
Per pt, no allergies to soy or egg products.Pt not taking any weight loss meds or using  O2 at home. 

## 2014-03-29 DIAGNOSIS — J3089 Other allergic rhinitis: Secondary | ICD-10-CM | POA: Diagnosis not present

## 2014-04-05 DIAGNOSIS — J3089 Other allergic rhinitis: Secondary | ICD-10-CM | POA: Diagnosis not present

## 2014-04-08 ENCOUNTER — Encounter: Payer: Self-pay | Admitting: Gynecology

## 2014-04-08 ENCOUNTER — Other Ambulatory Visit: Payer: Self-pay | Admitting: *Deleted

## 2014-04-08 ENCOUNTER — Other Ambulatory Visit (HOSPITAL_COMMUNITY)
Admission: RE | Admit: 2014-04-08 | Discharge: 2014-04-08 | Disposition: A | Discharge status: Home / Self Care | Payer: Medicare Other | Source: Ambulatory Visit | Attending: Gynecology | Admitting: Gynecology

## 2014-04-08 ENCOUNTER — Ambulatory Visit (INDEPENDENT_AMBULATORY_CARE_PROVIDER_SITE_OTHER): Payer: Medicare Other | Admitting: Gynecology

## 2014-04-08 VITALS — BP 128/82 | Ht 66.0 in | Wt 212.0 lb

## 2014-04-08 DIAGNOSIS — L900 Lichen sclerosus et atrophicus: Secondary | ICD-10-CM | POA: Diagnosis not present

## 2014-04-08 DIAGNOSIS — Z01419 Encounter for gynecological examination (general) (routine) without abnormal findings: Secondary | ICD-10-CM | POA: Diagnosis not present

## 2014-04-08 DIAGNOSIS — Z1272 Encounter for screening for malignant neoplasm of vagina: Secondary | ICD-10-CM

## 2014-04-08 DIAGNOSIS — Z87412 Personal history of vulvar dysplasia: Secondary | ICD-10-CM

## 2014-04-08 DIAGNOSIS — J3089 Other allergic rhinitis: Secondary | ICD-10-CM | POA: Diagnosis not present

## 2014-04-08 MED ORDER — ESTRADIOL 1 MG PO TABS: 1.0000 mg | ORAL_TABLET | Freq: Every day | ORAL | Status: DC

## 2014-04-08 MED ORDER — CLOBETASOL PROPIONATE 0.05 % EX CREA: TOPICAL_CREAM | CUTANEOUS | Status: DC

## 2014-04-08 NOTE — Addendum Note (Signed)
Addended by: Thurnell Garbe A on: 04/08/2014 03:22 PM   Modules accepted: Orders

## 2014-04-08 NOTE — Progress Notes (Signed)
Kristin Merritt 28-Feb-1961 [id number]   History:    53 y.o.  for annual gyn exam who was seen in the office for the first time as a new patient in February 2014. Patient had moved  To [facility] from [facility].  Because of patient's vasomotor symptoms she was started on Estrace 1 mg by mouth daily and it has curtailed her vasomotor symptoms. Patient has informed me that several years ago in [place]. 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 Patient's PCP is Dr. Megan Salon who has been doing her blood work. Patient scheduled for her first colonoscopy next month. She had a normal mammogram this year. Patient's flu vaccine and Otila Kluver vaccine are up-to-date.  Past medical history,surgical history, family history and social history were all reviewed and documented in the EPIC chart.  Gynecologic History No LMP recorded. Patient has had a hysterectomy. Contraception: status post hysterectomy Last Pap: 2014. Results were: normal Last mammogram: 2015. Results were: normal  Obstetric History OB History  Gravida Para Term Preterm AB SAB TAB Ectopic Multiple Living  1 1        1     # Outcome Date GA Lbr Len/2nd Weight Sex Delivery Anes PTL Lv  1 Para                ROS: A ROS was performed and pertinent positives and negatives are included in the history.  GENERAL: No fevers or chills. HEENT: No change in vision, no earache, sore throat or sinus congestion. NECK: No pain or stiffness. CARDIOVASCULAR: No chest pain or pressure. No palpitations. PULMONARY: No shortness of breath, cough or wheeze. GASTROINTESTINAL: No abdominal pain, nausea, vomiting or diarrhea, melena or bright red blood per rectum. GENITOURINARY: No urinary frequency, urgency, hesitancy or dysuria. MUSCULOSKELETAL: No joint or muscle pain, no back pain, no recent trauma.  DERMATOLOGIC: No rash, no itching, no lesions. ENDOCRINE: No polyuria, polydipsia, no heat or cold intolerance. No recent change in weight. HEMATOLOGICAL: No anemia or easy bruising or bleeding. NEUROLOGIC: No headache, seizures, numbness, tingling or weakness. PSYCHIATRIC: No depression, no loss of interest in normal activity or change in sleep pattern.     Exam: chaperone present  BP 128/82 mmHg  Ht 5\' 6"  (1.676 m)  Wt 212 lb (96.163 kg)  BMI 34.23 kg/m2  Body mass index is 34.23 kg/(m^2).  General appearance : Well developed well nourished female. No acute distress HEENT: Neck supple, trachea midline, no carotid bruits, no thyroidmegaly Lungs: Clear to auscultation, no rhonchi or wheezes, or rib retractions  Heart: Regular rate and rhythm, no murmurs or gallops Breast:Examined in sitting and supine position were symmetrical in appearance, no palpable masses or tenderness,  no skin retraction, no nipple inversion, no nipple discharge, no skin discoloration, no axillary or supraclavicular lymphadenopathy Abdomen: no palpable masses or tenderness, no rebound or guarding Extremities: no edema or skin discoloration or tenderness  Pelvic:  Bartholin, Urethra, Skene Glands: Within normal limits             Vagina: No gross lesions or discharge  Cervix:absent  Uterus  absente  Adnexa  Without masses or tenderness  Anus and perineum  normal   Rectovaginal  normal sphincter tone without palpated masses or tenderness             Hemoccult colonoscopy 2015  Assessment/Plan:  53 y.o. female for annual exam with past history of lichen sclerosus who occasionally complains of vulvar pruritus. She will be prescribed Clobetasol 0.05% to apply 2 times weekly. Prescription refill for Estrace 1 mg daily was provided. Pap smear was done today until we obtain records from [place] for better clarification as to the indication for hysterectomy as well as her vulvar dysplasia. Patient was reminded  of the importance of calcium vitamin D and regular exercise for osteoporosis prevention as well as monthly self breast examinations. Her PCP we'll be doing her blood work.  Terrance Mass MD, 2:35 PM 04/08/2014

## 2014-04-10 LAB — CYTOLOGY - PAP

## 2014-04-12 ENCOUNTER — Ambulatory Visit (INDEPENDENT_AMBULATORY_CARE_PROVIDER_SITE_OTHER): Payer: Medicare Other | Admitting: Family Medicine

## 2014-04-12 ENCOUNTER — Encounter: Payer: Self-pay | Admitting: Family Medicine

## 2014-04-12 VITALS — BP 126/76 | HR 81 | Temp 98.1°F | Wt 214.0 lb

## 2014-04-12 DIAGNOSIS — J029 Acute pharyngitis, unspecified: Secondary | ICD-10-CM

## 2014-04-12 DIAGNOSIS — J3089 Other allergic rhinitis: Secondary | ICD-10-CM | POA: Diagnosis not present

## 2014-04-12 DIAGNOSIS — J329 Chronic sinusitis, unspecified: Secondary | ICD-10-CM | POA: Diagnosis not present

## 2014-04-12 LAB — POCT RAPID STREP A (OFFICE): RAPID STREP A SCREEN: NEGATIVE

## 2014-04-12 MED ORDER — FLUCONAZOLE 150 MG PO TABS: ORAL_TABLET | ORAL | Status: DC

## 2014-04-12 MED ORDER — PREDNISONE 20 MG PO TABS: ORAL_TABLET | ORAL | Status: DC

## 2014-04-12 MED ORDER — AZITHROMYCIN 250 MG PO TABS: ORAL_TABLET | ORAL | Status: AC

## 2014-04-12 NOTE — Progress Notes (Signed)
Pre visit review using our clinic review tool, if applicable. No additional management support is needed unless otherwise documented below in the visit note. 

## 2014-04-12 NOTE — Progress Notes (Signed)
   Subjective:    Patient ID: Kristin Merritt, female    DOB: 1961-01-03, 53 y.o.   MRN: [id number]  HPI Acute visit. Patient seen with sore throat of one day duration. She's not any fevers or chills. She was concerned about possible strep throat though she's not had any specific exposures.  She relates several month history of sinus congestive symptoms. She states she's had frequent sinusitis in the past. She states the only thing that helps this is a brief course of prednisone and Zithromax. She has tried nasal steroids and multiple over-the-counter medications and saline nasal irrigation without improvement. No recent fever. Occasional yellowish nasal discharge. She has maxillary swelling and pain bilaterally. She has seen ENT surgeons apparently over at [place]  Past Medical History  Diagnosis Date  . Allergy   . Anxiety   . Hypertension   . Lichen sclerosus et atrophicus   . 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 Postmenopausal HRT (hormone replacement therapy) - followed by Dr. Toney Rakes in gyn 07/11/2012  . Asthma   . Uterine cancer 1991  . Depression   . High cholesterol   . UTI (urinary tract infection)    Past Surgical History  Procedure Laterality Date  . Eye surgery    . Spine surgery      L5-s1  . Vaginal hysterectomy      reports that she quit smoking about 22 months ago. Her smoking use included Cigarettes. She has a 20 pack-year smoking history. She has never used smokeless tobacco. She reports that she does not drink alcohol or use illicit drugs. family history includes Breast cancer in her mother; Cancer in her mother. No Known Allergies    Review of Systems  Constitutional: Positive for fatigue. Negative for fever and chills.  HENT: Positive for congestion, sinus pressure and sore throat.   Respiratory: Positive for cough.   Neurological: Positive for headaches.       Objective:   Physical Exam  Constitutional: She  appears well-developed and well-nourished.  HENT:  Right Ear: External ear normal.  Left Ear: External ear normal.  Mouth/Throat: Oropharynx is clear and moist.  Nasal turbinates are swollen bilaterally. No visible polyps.  Neck: Neck supple.  Cardiovascular: Normal rate and regular rhythm.   Pulmonary/Chest: Effort normal and breath sounds normal. No respiratory distress. She has no wheezes. She has no rales.  Lymphadenopathy:    She has no cervical adenopathy.          Assessment & Plan:  #1 acute pharyngitis. Rapid strep negative. Question viral versus allergic postnasal drip #2 recurrent versus chronic sinusitis. Patient has responded well to combination of prednisone and Zithromax in the past. We've cautioned against frequent use of prednisone.

## 2014-04-12 NOTE — Patient Instructions (Addendum)
HOLD Atorvastatin for one week while taking the Fluconazole.

## 2014-04-15 DIAGNOSIS — J3089 Other allergic rhinitis: Secondary | ICD-10-CM | POA: Diagnosis not present

## 2014-04-17 DIAGNOSIS — J3089 Other allergic rhinitis: Secondary | ICD-10-CM | POA: Diagnosis not present

## 2014-04-23 DIAGNOSIS — J3089 Other allergic rhinitis: Secondary | ICD-10-CM | POA: Diagnosis not present

## 2014-04-23 DIAGNOSIS — R05 Cough: Secondary | ICD-10-CM | POA: Diagnosis not present

## 2014-04-23 DIAGNOSIS — H1045 Other chronic allergic conjunctivitis: Secondary | ICD-10-CM | POA: Diagnosis not present

## 2014-04-26 ENCOUNTER — Telehealth: Payer: Self-pay | Admitting: Internal Medicine

## 2014-04-26 NOTE — Telephone Encounter (Signed)
Spoke with patient and she has cancelled her colonoscopy and doesn't need that paperwork now.  Family emergency per patient.

## 2014-04-29 ENCOUNTER — Encounter: Payer: Medicare Other | Admitting: Internal Medicine

## 2014-04-29 DIAGNOSIS — J3089 Other allergic rhinitis: Secondary | ICD-10-CM | POA: Diagnosis not present

## 2014-04-29 NOTE — Telephone Encounter (Signed)
no

## 2014-05-01 DIAGNOSIS — J310 Chronic rhinitis: Secondary | ICD-10-CM | POA: Diagnosis not present

## 2014-05-01 DIAGNOSIS — J343 Hypertrophy of nasal turbinates: Secondary | ICD-10-CM | POA: Diagnosis not present

## 2014-05-09 DIAGNOSIS — J3089 Other allergic rhinitis: Secondary | ICD-10-CM | POA: Diagnosis not present

## 2014-05-15 DIAGNOSIS — J3089 Other allergic rhinitis: Secondary | ICD-10-CM | POA: Diagnosis not present

## 2014-05-21 DIAGNOSIS — J3089 Other allergic rhinitis: Secondary | ICD-10-CM | POA: Diagnosis not present

## 2014-05-27 DIAGNOSIS — J3089 Other allergic rhinitis: Secondary | ICD-10-CM | POA: Diagnosis not present

## 2014-06-03 ENCOUNTER — Encounter: Payer: Medicare Other | Admitting: Family

## 2014-06-03 ENCOUNTER — Ambulatory Visit (INDEPENDENT_AMBULATORY_CARE_PROVIDER_SITE_OTHER): Payer: Medicare Other

## 2014-06-03 DIAGNOSIS — Z111 Encounter for screening for respiratory tuberculosis: Secondary | ICD-10-CM | POA: Diagnosis not present

## 2014-06-03 DIAGNOSIS — J3089 Other allergic rhinitis: Secondary | ICD-10-CM | POA: Diagnosis not present

## 2014-06-05 LAB — TB SKIN TEST
INDURATION: 0 mm
TB Skin Test: NEGATIVE

## 2014-06-11 DIAGNOSIS — J3089 Other allergic rhinitis: Secondary | ICD-10-CM | POA: Diagnosis not present

## 2014-06-19 ENCOUNTER — Ambulatory Visit (INDEPENDENT_AMBULATORY_CARE_PROVIDER_SITE_OTHER): Payer: Medicare Other | Admitting: Family

## 2014-06-19 ENCOUNTER — Encounter: Payer: Self-pay | Admitting: Family

## 2014-06-19 VITALS — BP 100/80 | HR 74 | Temp 98.1°F | Ht 66.0 in | Wt 213.0 lb

## 2014-06-19 DIAGNOSIS — F411 Generalized anxiety disorder: Secondary | ICD-10-CM | POA: Diagnosis not present

## 2014-06-19 DIAGNOSIS — J3089 Other allergic rhinitis: Secondary | ICD-10-CM | POA: Diagnosis not present

## 2014-06-19 DIAGNOSIS — J309 Allergic rhinitis, unspecified: Secondary | ICD-10-CM | POA: Diagnosis not present

## 2014-06-19 DIAGNOSIS — G8929 Other chronic pain: Secondary | ICD-10-CM | POA: Diagnosis not present

## 2014-06-19 DIAGNOSIS — Z0000 Encounter for general adult medical examination without abnormal findings: Secondary | ICD-10-CM

## 2014-06-19 DIAGNOSIS — I10 Essential (primary) hypertension: Secondary | ICD-10-CM

## 2014-06-19 DIAGNOSIS — M545 Low back pain: Secondary | ICD-10-CM

## 2014-06-19 DIAGNOSIS — E669 Obesity, unspecified: Secondary | ICD-10-CM

## 2014-06-19 DIAGNOSIS — E785 Hyperlipidemia, unspecified: Secondary | ICD-10-CM | POA: Diagnosis not present

## 2014-06-19 MED ORDER — LORATADINE 10 MG PO TABS: 10.0000 mg | ORAL_TABLET | Freq: Every day | ORAL | Status: DC

## 2014-06-19 MED ORDER — SACCHARIN CALCIUM CRYS: 1.0000 | CRYSTALS | Freq: Once | Status: DC

## 2014-06-19 NOTE — Progress Notes (Signed)
Subjective:    Patient ID: Kristin Merritt, female    DOB: 20-Apr-1961, 54 y.o.   MRN: [id number]  HPI 54 y.o. African American presents for physical. I reviewed all health maintenance protocols including mammography, colonoscopy, bone density Needed referrals were placed. Age and diagnosis  appropriate screening labs were ordered. Her immunization history was reviewed and appropriate vaccinations were ordered. Her current medications and allergies were reviewed and needed refills of her chronic medications were ordered. The plan for yearly health maintenance was discussed all orders and referrals were made as appropriate. Pt has chief complaint of lower back pain. 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 Pt states the pain occurs a few times a month, it is a 9 when it occurs, radiates down her leg to her feet, exercise makes the pain worse and nothing makes it better. Pt has tried tramadol for back pain before but stated it was not helpful. She denies wanting other medication for pain due to family hx of addiction. Pt denies fatigue, SOB, chest pain, fever and change in appetite.   Past Medical History  Diagnosis Date  . Allergy   . Anxiety   . Hypertension   . Lichen sclerosus et atrophicus   . 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 Postmenopausal HRT (hormone replacement therapy) - followed by Dr. Toney Rakes in gyn 07/11/2012  . Asthma   . Uterine cancer 1991  . Depression   . High cholesterol   . UTI (urinary tract infection)     History   Social History  . Marital Status: Widowed    Spouse Name: N/A    Number of Children: N/A  . Years of Education: N/A   Occupational History  . Not on file.   Social History Main Topics  . Smoking status: Former Smoker -- 1.00 packs/day for 20 years    Types: Cigarettes    Quit date: 06/05/2012  . Smokeless tobacco: Never Used  . Alcohol Use: No  . Drug Use: No  . Sexual Activity:  Yes    Birth Control/ Protection: Condom   Other Topics Concern  . 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 Eye surgery    . Spine surgery      L5-s1  . Vaginal hysterectomy      Family History  Problem Relation Age of Onset  . Cancer Mother     LUNG   . Breast cancer Mother     No Known Allergies  Current Outpatient Prescriptions on File Prior to Visit  Medication Sig Dispense Refill  . albuterol (PROVENTIL HFA;VENTOLIN HFA) 108 (90 BASE) MCG/ACT inhaler Inhale 2 puffs into the lungs every 6 (six) hours as needed.    Marland Kitchen aspirin 81 MG tablet Take 81 mg by mouth daily.    Marland Kitchen atorvastatin (LIPITOR) 20 MG tablet TAKE 1 TABLET BY MOUTH EVERY DAY 90 tablet 3  . bisacodyl (DULCOLAX) 5 MG EC tablet Take 5 mg by mouth. Dulcolax 5 mg bowel prep -Take as directed    . cholecalciferol (VITAMIN D) 1000 UNITS tablet Take  1,000 Units by mouth daily.    . clobetasol cream (TEMOVATE) 0.05 % Apply 2 times weekly when necessary 30 g 5  . cyanocobalamin 100 MCG tablet Take 500 mcg by mouth daily.    . diazepam (VALIUM) 5 MG tablet Use very sparingly for panic attack - not more then once in a day. 30 tablet 2  . docusate sodium (COLACE) 50 MG capsule Take by mouth as needed. Over the counter Equate stool softener    . estradiol (ESTRACE) 1 MG tablet Take 1 tablet (1 mg total) by mouth daily. 90 tablet 4  . fluticasone (FLONASE) 50 MCG/ACT nasal spray Place 2 sprays into both nostrils daily. 16 g 6  . Fluticasone-Salmeterol (ADVAIR) 250-50 MCG/DOSE AEPB Inhale 1 puff into the lungs as needed.    Marland Kitchen KLOR-CON M20 20 MEQ tablet TAKE 1 TABLET BY MOUTH EVERY DAY 30 tablet 3  . lansoprazole (PREVACID) 30 MG capsule TAKE 1 CAPSULE BY MOUTH ONCE DAILY 90 capsule 3  .  levocetirizine (XYZAL) 5 MG tablet Take 5 mg by mouth every 12 (twelve) hours.    . montelukast (SINGULAIR) 10 MG tablet Take 10 mg by mouth at bedtime.    Marland Kitchen oxyCODONE-acetaminophen (ROXICET) 5-325 MG per tablet Take 1 tablet by mouth every 8 (eight) hours as needed for pain. 20 tablet 0  . polyethylene glycol powder (MIRALAX) powder Take 1 Container by mouth. Miralax bowel prep 238 gms-Take as directed    . predniSONE (DELTASONE) 20 MG tablet Taper as follows: 3-3-2-2-2-1-1-1-1/2-1/2 17 tablet 0  . ranitidine (ZANTAC) 150 MG tablet Take 150 mg by mouth 2 (two) times daily.    Jabier Gauss 40-5-12.5 MG TABS TAKE 1 TABLET BY MOUTH ONCE DAILY 30 tablet 3  . zolpidem (AMBIEN) 10 MG tablet Take 10 mg by mouth at bedtime as needed for sleep.    . fluconazole (DIFLUCAN) 150 MG tablet Take as directed prn yeast vaginitis (Patient not taking: Reported on 06/19/2014) 2 tablet 0  . furosemide (LASIX) 20 MG tablet Take 1 tablet (20 mg total) by mouth daily. 30 tablet 0  . furosemide (LASIX) 20 MG tablet Take 1 tablet (20 mg total) by mouth daily. 90 tablet 0   No current facility-administered medications on file prior to visit.    BP 100/80 mmHg  Pulse 74  Temp(Src) 98.1 F (36.7 C) (Oral)  Ht 5\' 6"  (1.676 m)  Wt 213 lb (96.616 kg)  BMI 34.40 kg/m2chart  Review of Systems  Constitutional: Negative.  Negative for chills, activity change, appetite change and fatigue.  HENT: Negative.   Eyes: Negative.   Respiratory: Negative.  Negative for cough and shortness of breath.   Cardiovascular: Negative.  Negative for chest pain and leg swelling.  Gastrointestinal: Positive for constipation.       Acknowledges occasional constipation.   Endocrine: Negative.  Negative for polydipsia and polyphagia.  Genitourinary: Negative.  Negative for dysuria and frequency.  Musculoskeletal: Positive for back pain.  Skin: Negative.   Neurological: Negative.  Negative for dizziness and weakness.    Psychiatric/Behavioral: Negative.        Objective:   Physical Exam  Constitutional: She is oriented to person, place, and time. She appears well-developed and well-nourished. She is active.  HENT:  Head: Normocephalic.  Right Ear: Tympanic membrane, external ear and ear canal normal.  Left Ear: Tympanic membrane, external ear and ear canal normal.  Nose: Nose normal.  Mouth/Throat: Uvula is midline, oropharynx is clear and moist and mucous membranes  are normal.  Eyes: Conjunctivae, EOM and lids are normal. Pupils are equal, round, and reactive to light.  Neck: Trachea normal and normal range of motion. Neck supple.  Cardiovascular: Normal rate, regular rhythm, normal heart sounds and normal pulses.   Pulmonary/Chest: Effort normal and breath sounds normal.  Abdominal: Soft. Normal appearance and bowel sounds are normal.  Musculoskeletal: Normal range of motion.  Neurological: She is alert and oriented to person, place, and time. She has normal strength and normal reflexes.  Skin: Skin is warm, dry and intact.  Psychiatric: She has a normal mood and affect. Her speech is normal and behavior is normal.          Assessment & Plan:  Kristin Merritt was seen today for annual exam.  Diagnoses and associated orders for this visit:  Essential hypertension - CBC with Differential; Future - CMP; Future - POCT urinalysis dipstick  Hyperlipidemia - CBC with Differential; Future - Lipid Panel; Future  Chronic low back pain  Generalized anxiety disorder - Cancel: TSH - TSH; Future  Allergic rhinitis, unspecified allergic rhinitis type  Routine general medical examination at a health care facility - CBC with Differential; Future - CMP; Future - POCT urinalysis dipstick - Lipid Panel; Future  Obesity - TSH; Future  Other Orders - loratadine (CLARITIN) 10 MG tablet; Take 1 tablet (10 mg total) by mouth daily. - Saccharin Calcium CRYS; 1 Package by Does not apply route  once.   Education given about eating a healthy diet and daily exercise encouraged.

## 2014-06-19 NOTE — Patient Instructions (Signed)
Exercise to Lose Weight Exercise and a healthy diet may help you lose weight. Your doctor may suggest specific exercises. EXERCISE IDEAS AND TIPS  Choose low-cost things you enjoy doing, such as walking, bicycling, or exercising to workout videos.  Take stairs instead of the elevator.  Walk during your lunch break.  Park your car further away from work or school.  Go to a gym or an exercise class.  Start with 5 to 10 minutes of exercise each day. Build up to 30 minutes of exercise 4 to 6 days a week.  Wear shoes with good support and comfortable clothes.  Stretch before and after working out.  Work out until you breathe harder and your heart beats faster.  Drink extra water when you exercise.  Do not do so much that you hurt yourself, feel dizzy, or get very short of breath. Exercises that burn about 150 calories:  Running 1  miles in 15 minutes.  Playing volleyball for 45 to 60 minutes.  Washing and waxing a car for 45 to 60 minutes.  Playing touch football for 45 minutes.  Walking 1  miles in 35 minutes.  Pushing a stroller 1  miles in 30 minutes.  Playing basketball for 30 minutes.  Raking leaves for 30 minutes.  Bicycling 5 miles in 30 minutes.  Walking 2 miles in 30 minutes.  Dancing for 30 minutes.  Shoveling snow for 15 minutes.  Swimming laps for 20 minutes.  Walking up stairs for 15 minutes.  Bicycling 4 miles in 15 minutes.  Gardening for 30 to 45 minutes.  Jumping rope for 15 minutes.  Washing windows or floors for 45 to 60 minutes. Document Released: 06/12/2010 Document Revised: 08/02/2011 Document Reviewed: 06/12/2010 [employer] Patient Information 2015 [employer]. This information is not intended to replace advice given to you by your health care provider. Make sure you discuss any questions you have with your health care provider.  

## 2014-06-19 NOTE — Progress Notes (Signed)
Pre visit review using our clinic review tool, if applicable. No additional management support is needed unless otherwise documented below in the visit note. 

## 2014-06-27 ENCOUNTER — Other Ambulatory Visit (INDEPENDENT_AMBULATORY_CARE_PROVIDER_SITE_OTHER): Payer: Medicare Other

## 2014-06-27 DIAGNOSIS — F411 Generalized anxiety disorder: Secondary | ICD-10-CM | POA: Diagnosis not present

## 2014-06-27 DIAGNOSIS — E669 Obesity, unspecified: Secondary | ICD-10-CM

## 2014-06-27 DIAGNOSIS — E785 Hyperlipidemia, unspecified: Secondary | ICD-10-CM

## 2014-06-27 DIAGNOSIS — J3089 Other allergic rhinitis: Secondary | ICD-10-CM | POA: Diagnosis not present

## 2014-06-27 DIAGNOSIS — Z0000 Encounter for general adult medical examination without abnormal findings: Secondary | ICD-10-CM

## 2014-06-27 DIAGNOSIS — I10 Essential (primary) hypertension: Secondary | ICD-10-CM

## 2014-06-27 LAB — CBC WITH DIFFERENTIAL/PLATELET
BASOS PCT: 0.4 % (ref 0.0–3.0)
Basophils Absolute: 0 10*3/uL (ref 0.0–0.1)
Eosinophils Absolute: 0.1 10*3/uL (ref 0.0–0.7)
Eosinophils Relative: 1.9 % (ref 0.0–5.0)
HCT: 37.7 % (ref 36.0–46.0)
HEMOGLOBIN: 12.5 g/dL (ref 12.0–15.0)
Lymphocytes Relative: 42.6 % (ref 12.0–46.0)
Lymphs Abs: 3.2 10*3/uL (ref 0.7–4.0)
MCHC: 33.2 g/dL (ref 30.0–36.0)
MCV: 80.6 fl (ref 78.0–100.0)
Monocytes Absolute: 0.6 10*3/uL (ref 0.1–1.0)
Monocytes Relative: 7.7 % (ref 3.0–12.0)
NEUTROS ABS: 3.5 10*3/uL (ref 1.4–7.7)
NEUTROS PCT: 47.4 % (ref 43.0–77.0)
PLATELETS: 187 10*3/uL (ref 150.0–400.0)
RBC: 4.68 Mil/uL (ref 3.87–5.11)
RDW: 14.8 % (ref 11.5–15.5)
WBC: 7.4 10*3/uL (ref 4.0–10.5)

## 2014-06-27 LAB — COMPREHENSIVE METABOLIC PANEL
ALBUMIN: 4.1 g/dL (ref 3.5–5.2)
ALK PHOS: 61 U/L (ref 39–117)
ALT: 19 U/L (ref 0–35)
AST: 18 U/L (ref 0–37)
BILIRUBIN TOTAL: 0.5 mg/dL (ref 0.2–1.2)
BUN: 9 mg/dL (ref 6–23)
CO2: 27 mEq/L (ref 19–32)
Calcium: 9.3 mg/dL (ref 8.4–10.5)
Chloride: 104 mEq/L (ref 96–112)
Creatinine, Ser: 0.94 mg/dL (ref 0.40–1.20)
GFR: 79.98 mL/min (ref >60.00)
GLUCOSE: 93 mg/dL (ref 70–99)
Potassium: 3.8 mEq/L (ref 3.5–5.1)
SODIUM: 140 meq/L (ref 135–145)
Total Protein: 6.5 g/dL (ref 6.0–8.3)

## 2014-06-27 LAB — LIPID PANEL
Cholesterol: 166 mg/dL (ref 0–200)
HDL: 75 mg/dL (ref >39.00)
LDL CALC: 70 mg/dL (ref 0–99)
NonHDL: 91
Total CHOL/HDL Ratio: 2
Triglycerides: 107 mg/dL (ref 0.0–149.0)
VLDL: 21.4 mg/dL (ref 0.0–40.0)

## 2014-06-27 LAB — TSH: TSH: 2.34 u[IU]/mL (ref 0.35–4.50)

## 2014-07-02 DIAGNOSIS — J3089 Other allergic rhinitis: Secondary | ICD-10-CM | POA: Diagnosis not present

## 2014-07-12 DIAGNOSIS — J3089 Other allergic rhinitis: Secondary | ICD-10-CM | POA: Diagnosis not present

## 2014-07-15 ENCOUNTER — Other Ambulatory Visit: Payer: Self-pay | Admitting: Family

## 2014-07-19 DIAGNOSIS — J3089 Other allergic rhinitis: Secondary | ICD-10-CM | POA: Diagnosis not present

## 2014-07-25 ENCOUNTER — Other Ambulatory Visit: Payer: Self-pay | Admitting: Family

## 2014-07-25 DIAGNOSIS — J3089 Other allergic rhinitis: Secondary | ICD-10-CM | POA: Diagnosis not present

## 2014-07-26 DIAGNOSIS — J3089 Other allergic rhinitis: Secondary | ICD-10-CM | POA: Diagnosis not present

## 2014-07-30 ENCOUNTER — Ambulatory Visit (INDEPENDENT_AMBULATORY_CARE_PROVIDER_SITE_OTHER): Payer: Medicare Other | Admitting: Internal Medicine

## 2014-07-30 ENCOUNTER — Encounter: Payer: Self-pay | Admitting: Internal Medicine

## 2014-07-30 VITALS — BP 130/84 | HR 92 | Temp 99.9°F | Resp 20 | Ht 66.0 in | Wt 212.0 lb

## 2014-07-30 DIAGNOSIS — I10 Essential (primary) hypertension: Secondary | ICD-10-CM

## 2014-07-30 DIAGNOSIS — J069 Acute upper respiratory infection, unspecified: Secondary | ICD-10-CM | POA: Diagnosis not present

## 2014-07-30 DIAGNOSIS — J3089 Other allergic rhinitis: Secondary | ICD-10-CM | POA: Diagnosis not present

## 2014-07-30 DIAGNOSIS — E785 Hyperlipidemia, unspecified: Secondary | ICD-10-CM | POA: Diagnosis not present

## 2014-07-30 DIAGNOSIS — B9789 Other viral agents as the cause of diseases classified elsewhere: Secondary | ICD-10-CM

## 2014-07-30 MED ORDER — HYDROCODONE-HOMATROPINE 5-1.5 MG/5ML PO SYRP: 5.0000 mL | ORAL_SOLUTION | Freq: Four times a day (QID) | ORAL | Status: DC | PRN

## 2014-07-30 NOTE — Progress Notes (Signed)
Subjective:    Patient ID: Kristin Merritt, female    DOB: [birth year], 54 y.o.   MRN: [id number]  HPI 54 year old patient who has a history of allergic rhinitis.  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  She has a history of asthma and does take maintenance medications as well as weekly immunotherapy.  Complaints include head and chest congestion, cough, myalgias, sore throat and malaise.  No wheezing or shortness of breath.  Cough is only minimally productive.  Past Medical History  Diagnosis Date  . Allergy   . Anxiety   . Hypertension   . Lichen sclerosus et atrophicus   . 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 Postmenopausal HRT (hormone replacement therapy) - followed by Dr. Toney Rakes in gyn 07/11/2012  . Asthma   . Uterine cancer 1991  . Depression   . High cholesterol   . UTI (urinary tract infection)     History   Social History  . Marital Status: Widowed    Spouse Name: N/A  . Number of Children: N/A  . Years of Education: N/A   Occupational History  . Not on file.   Social History Main Topics  . Smoking status: Former Smoker -- 1.00 packs/day for 20 years    Types: Cigarettes    Quit date: 06/05/2012  . Smokeless tobacco: Never Used  . Alcohol Use: No  . Drug Use: No  . Sexual Activity: Yes    Birth Control/ Protection: Condom   Other Topics Concern  . 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 Eye surgery    . Spine surgery      L5-s1  . Vaginal hysterectomy      Family History  Problem Relation Age of Onset  . Cancer Mother     LUNG   . Breast cancer Mother     No Known Allergies  Current  Outpatient Prescriptions on File Prior to Visit  Medication Sig Dispense Refill  . albuterol (PROVENTIL HFA;VENTOLIN HFA) 108 (90 BASE) MCG/ACT inhaler Inhale 2 puffs into the lungs every 6 (six) hours as needed.    Marland Kitchen aspirin 81 MG tablet Take 81 mg by mouth daily.    Marland Kitchen atorvastatin (LIPITOR) 20 MG tablet TAKE 1 TABLET BY MOUTH EVERY DAY 90 tablet 3  . bisacodyl (DULCOLAX) 5 MG EC tablet Take 5 mg by mouth. Dulcolax 5 mg bowel prep -Take as directed    . cholecalciferol (VITAMIN D) 1000 UNITS tablet Take 1,000 Units by mouth daily.    . clobetasol cream (TEMOVATE) 0.05 % Apply 2 times weekly when necessary 30 g 5  . cyanocobalamin 100 MCG tablet Take 500 mcg by mouth daily.    . diazepam (VALIUM) 5 MG tablet Use very sparingly for panic attack - not more then once in a day. 30 tablet 2  . docusate sodium (COLACE) 50 MG capsule Take by mouth as needed. Over the counter Equate stool softener    . estradiol (ESTRACE) 1 MG tablet Take 1  tablet (1 mg total) by mouth daily. 90 tablet 4  . fluticasone (FLONASE) 50 MCG/ACT nasal spray Place 2 sprays into both nostrils daily. 16 g 6  . Fluticasone-Salmeterol (ADVAIR) 250-50 MCG/DOSE AEPB Inhale 1 puff into the lungs as needed.    Marland Kitchen KLOR-CON M20 20 MEQ tablet TAKE 1 TABLET BY MOUTH EVERY DAY 30 tablet 3  . lansoprazole (PREVACID) 30 MG capsule TAKE 1 CAPSULE BY MOUTH ONCE DAILY 90 capsule 3  . levocetirizine (XYZAL) 5 MG tablet Take 5 mg by mouth every 12 (twelve) hours.    Marland Kitchen loratadine (CLARITIN) 10 MG tablet Take 1 tablet (10 mg total) by mouth daily. 30 tablet 11  . montelukast (SINGULAIR) 10 MG tablet Take 10 mg by mouth at bedtime.    Marland Kitchen oxyCODONE-acetaminophen (ROXICET) 5-325 MG per tablet Take 1 tablet by mouth every 8 (eight) hours as needed for pain. 20 tablet 0  . polyethylene glycol powder (MIRALAX) powder Take 1 Container by mouth. Miralax bowel prep 238 gms-Take as directed    . ranitidine (ZANTAC) 150 MG tablet Take 150 mg by mouth 2 (two)  times daily.    Jabier Gauss 40-5-12.5 MG TABS TAKE 1 TABLET BY MOUTH EVERY DAY 30 tablet 3  . zolpidem (AMBIEN) 10 MG tablet Take 10 mg by mouth at bedtime as needed for sleep.     No current facility-administered medications on file prior to visit.    BP 130/84 mmHg  Pulse 92  Temp(Src) 99.9 F (37.7 C) (Oral)  Resp 20  Ht 5\' 6"  (1.676 m)  Wt 212 lb (96.163 kg)  BMI 34.23 kg/m2  SpO2 96%     Review of Systems  Constitutional: Positive for fever, activity change, appetite change and fatigue. Negative for chills.  HENT: Positive for congestion, postnasal drip, rhinorrhea, sinus pressure and sore throat. Negative for dental problem, hearing loss and tinnitus.   Eyes: Negative for pain, discharge and visual disturbance.  Respiratory: Positive for cough. Negative for shortness of breath.   Cardiovascular: Negative for chest pain, palpitations and leg swelling.  Gastrointestinal: Negative for nausea, vomiting, abdominal pain, diarrhea, constipation, blood in stool and abdominal distention.  Genitourinary: Negative for dysuria, urgency, frequency, hematuria, flank pain, vaginal bleeding, vaginal discharge, difficulty urinating, vaginal pain and pelvic pain.  Musculoskeletal: Positive for myalgias. Negative for joint swelling, arthralgias and gait problem.  Skin: Negative for rash.  Neurological: Negative for dizziness, syncope, speech difficulty, weakness, numbness and headaches.  Hematological: Negative for adenopathy.  Psychiatric/Behavioral: Negative for behavioral problems, dysphoric mood and agitation. The patient is not nervous/anxious.        Objective:   Physical Exam  Constitutional: She is oriented to person, place, and time. She appears well-developed and well-nourished.  HENT:  Head: Normocephalic.  Right Ear: External ear normal.  Left Ear: External ear normal.  Mouth/Throat: Oropharynx is clear and moist.  Eyes: Conjunctivae and EOM are normal. Pupils are equal,  round, and reactive to light.  Neck: Normal range of motion. Neck supple. No thyromegaly present.  Cardiovascular: Normal rate, regular rhythm, normal heart sounds and intact distal pulses.   Pulmonary/Chest: Effort normal and breath sounds normal. No respiratory distress. She has no wheezes. She has no rales.  O2 saturation 96  Abdominal: Soft. Bowel sounds are normal. She exhibits no mass. There is no tenderness.  Musculoskeletal: Normal range of motion.  Lymphadenopathy:    She has no cervical adenopathy.  Neurological: She is alert and oriented to person, place, and time.  Skin: Skin is warm and dry. No rash noted.  Psychiatric: She has a normal mood and affect. Her behavior is normal.          Assessment & Plan:  Viral URI with cough Hypertension, controlled Chronic asthma, stable Allergic rhinitis  Will treat symptomatically.  Patient instructions discussed and dispensed

## 2014-07-30 NOTE — Progress Notes (Signed)
Pre visit review using our clinic review tool, if applicable. No additional management support is needed unless otherwise documented below in the visit note. 

## 2014-07-30 NOTE — Patient Instructions (Signed)
Acute bronchitis symptoms for less than 10 days are generally not helped by antibiotics.  Take over-the-counter expectorants and cough medications such as  Mucinex DM.  Call if there is no improvement in 5 to 7 days or if  you develop worsening cough, fever, or new symptoms, such as shortness of breath or chest pain.  DIAGNOSIS  Acute bronchitis is usually diagnosed through a physical exam. Your health care provider will also ask you questions about your medical history. Tests, such as chest X-rays, are sometimes done to rule out other conditions.  TREATMENT  Acute bronchitis usually goes away in a couple weeks. Oftentimes, no medical treatment is necessary. Medicines are sometimes given for relief of fever or cough. Antibiotic medicines are usually not needed but may be prescribed in certain situations. In some cases, an inhaler may be recommended to help reduce shortness of breath and control the cough. A cool mist vaporizer may also be used to help thin bronchial secretions and make it easier to clear the chest.  HOME CARE INSTRUCTIONS  Get plenty of rest.  Drink enough fluids to keep your urine clear or pale yellow (unless you have a medical condition that requires fluid restriction). Increasing fluids may help thin your respiratory secretions (sputum) and reduce chest congestion, and it will prevent dehydration.  Take medicines only as directed by your health care provider.   Avoid smoking and secondhand smoke. Exposure to cigarette smoke or irritating chemicals will make bronchitis worse. If you are a smoker, consider using nicotine gum or skin patches to help control withdrawal symptoms. Quitting smoking will help your lungs heal faster.  Reduce the chances of another bout of acute bronchitis by washing your hands frequently, avoiding people with cold symptoms, and trying not to touch your hands to your mouth, nose, or eyes.

## 2014-08-05 ENCOUNTER — Ambulatory Visit (INDEPENDENT_AMBULATORY_CARE_PROVIDER_SITE_OTHER): Payer: Medicare Other | Admitting: Family Medicine

## 2014-08-05 ENCOUNTER — Encounter: Payer: Self-pay | Admitting: Family Medicine

## 2014-08-05 VITALS — BP 108/76 | HR 80 | Temp 98.3°F | Wt 218.0 lb

## 2014-08-05 DIAGNOSIS — B349 Viral infection, unspecified: Secondary | ICD-10-CM | POA: Diagnosis not present

## 2014-08-05 DIAGNOSIS — J069 Acute upper respiratory infection, unspecified: Secondary | ICD-10-CM

## 2014-08-05 DIAGNOSIS — J329 Chronic sinusitis, unspecified: Secondary | ICD-10-CM

## 2014-08-05 DIAGNOSIS — R6889 Other general symptoms and signs: Secondary | ICD-10-CM

## 2014-08-05 DIAGNOSIS — B9789 Other viral agents as the cause of diseases classified elsewhere: Secondary | ICD-10-CM

## 2014-08-05 LAB — POCT INFLUENZA A/B
INFLUENZA A, POC: NEGATIVE
Influenza B, POC: NEGATIVE

## 2014-08-05 MED ORDER — PREDNISONE 20 MG PO TABS: ORAL_TABLET | ORAL | Status: DC

## 2014-08-05 MED ORDER — HYDROCODONE-HOMATROPINE 5-1.5 MG/5ML PO SYRP: 5.0000 mL | ORAL_SOLUTION | Freq: Four times a day (QID) | ORAL | Status: DC | PRN

## 2014-08-05 NOTE — Patient Instructions (Addendum)
Flu test negative  Upper respiratory infection  May have triggered asthma-trial prednisone x 7 days  Refilled cough syrup (use sparingly-perhaps just for sleep)  Follow up if no relief within 10 days or new or worsening symptoms

## 2014-08-05 NOTE — Progress Notes (Signed)
Garret Reddish, MD Phone: [phone]  Subjective:   Kristin Merritt is a 54 y.o. year old very pleasant female patient who presents with the following:  Cough/congestion/wheeze -Seen on 07/30/14 by Dr. Raliegh Ip. 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 She has asthma on advair and singulair. She was not having wheezing or shortness of breath. Minimally productive cough.   Symptoms started on 3/7 with sore throat, chills, congestion. Woke up with symptoms including body aches and felt like started suddenly. Coughing and some dry heaves. Fevers around 101. Last fever was about 3 nights ago. Feels slightly better. The cough continues to be dry but occasionally getting some sputum. Taking mucinex. Feels stuffy again but still taking mucinex. Ran out hycodan.   Does have some sinus pressure but not most prominent symptom. Taking albuterol every 4 hours until yesterday when doing some better.   Has started to have some wheeze and taking albuterol every 4 hours.   ROS- no fever/chills/nausea/vomiting.   Past Medical History- asthma, HTN, HLD, anxiety, chronic low back pain  Medications- reviewed and updated Current Outpatient Prescriptions  Medication Sig Dispense Refill  . albuterol (PROVENTIL HFA;VENTOLIN HFA) 108 (90 BASE) MCG/ACT inhaler Inhale 2 puffs into the lungs every 6 (six) hours as needed.    Marland Kitchen aspirin 81 MG tablet Take 81 mg by mouth daily.    Marland Kitchen atorvastatin (LIPITOR) 20 MG tablet TAKE 1 TABLET BY MOUTH EVERY DAY 90 tablet 3  . cholecalciferol (VITAMIN D) 1000 UNITS tablet Take 1,000 Units by mouth daily.    . clobetasol cream (TEMOVATE) 0.05 % Apply 2 times weekly when necessary 30 g 5  . cyanocobalamin 100 MCG tablet Take 500 mcg by mouth daily.    Marland Kitchen estradiol (ESTRACE) 1 MG tablet Take 1 tablet (1 mg total) by mouth daily. 90 tablet 4  . fluticasone (FLONASE) 50 MCG/ACT nasal spray Place 2 sprays into both nostrils daily.  16 g 6  . Fluticasone-Salmeterol (ADVAIR) 250-50 MCG/DOSE AEPB Inhale 1 puff into the lungs as needed.    Marland Kitchen KLOR-CON M20 20 MEQ tablet TAKE 1 TABLET BY MOUTH EVERY DAY 30 tablet 3  . lansoprazole (PREVACID) 30 MG capsule TAKE 1 CAPSULE BY MOUTH ONCE DAILY 90 capsule 3  . levocetirizine (XYZAL) 5 MG tablet Take 5 mg by mouth every 12 (twelve) hours.    Marland Kitchen loratadine (CLARITIN) 10 MG tablet Take 1 tablet (10 mg total) by mouth daily. 30 tablet 11  . montelukast (SINGULAIR) 10 MG tablet Take 10 mg by mouth at bedtime.    . ranitidine (ZANTAC) 150 MG tablet Take 150 mg by mouth 2 (two) times daily.    Jabier Gauss 40-5-12.5 MG TABS TAKE 1 TABLET BY MOUTH EVERY DAY 30 tablet 3  . bisacodyl (DULCOLAX) 5 MG EC tablet Take 5 mg by mouth. Dulcolax 5 mg bowel prep -Take as directed    . diazepam (VALIUM) 5 MG tablet Use very sparingly for panic attack - not more then once in a day. (Patient not taking: Reported on 08/05/2014) 30 tablet 2  . docusate sodium (COLACE) 50 MG capsule Take by mouth as needed. Over the counter Equate stool softener    . HYDROcodone-homatropine (HYCODAN) 5-1.5 MG/5ML syrup Take 5 mLs by mouth every 6 (six) hours as needed for cough. (Patient not taking: Reported on 08/05/2014) 120 mL 0  . oxyCODONE-acetaminophen (ROXICET) 5-325 MG per tablet Take 1 tablet by mouth every 8 (eight)  hours as needed for pain. (Patient not taking: Reported on 08/05/2014) 20 tablet 0  . polyethylene glycol powder (MIRALAX) powder Take 1 Container by mouth. Miralax bowel prep 238 gms-Take as directed    . zolpidem (AMBIEN) 10 MG tablet Take 10 mg by mouth at bedtime as needed for sleep.     Objective: BP 108/76 mmHg  Pulse 80  Temp(Src) 98.3 F (36.8 C)  Wt 218 lb (98.884 kg)  SpO2 97% Gen: NAD, resting comfortably HEENT: nares erythematous and swollen, oropharynx normal without pharyngeal exudate, TM normal bilaterally, Mucous membranes are moist. Slightly tender left maxillary sinus.  CV: RRR no  murmurs rubs or gallops Lungs: CTAB no crackles, wheeze, rhonchi (reports not wheezing today but has had intermittent issues with current illness) obese Ext: no edema Skin: warm, dry, no rash   Assessment/Plan:  Upper Respiratory Infection ? Viral sinusitis Advised of symptomatic care> patient requests hycodan cough syrup and given small supply.   URI seems to have irritated asthma with intermittent wheeze-we will trial a course of prednisone to calm down this inflammation as well as sinus irritation (? Viral sinusitis). Follow up if does not resolve.  Doubt influenza even with sick contact and flu negative.   Given reasons for return   Results for orders placed or performed in visit on 08/05/14 (from the past 24 hour(s))  POC Influenza A/B     Status: None   Collection Time: 08/05/14  2:33 PM  Result Value Ref Range   Influenza A, POC Negative    Influenza B, POC Negative

## 2014-08-09 DIAGNOSIS — J3089 Other allergic rhinitis: Secondary | ICD-10-CM | POA: Diagnosis not present

## 2014-08-16 ENCOUNTER — Other Ambulatory Visit: Payer: Self-pay | Admitting: Family

## 2014-08-20 DIAGNOSIS — J3081 Allergic rhinitis due to animal (cat) (dog) hair and dander: Secondary | ICD-10-CM | POA: Diagnosis not present

## 2014-08-20 DIAGNOSIS — J3089 Other allergic rhinitis: Secondary | ICD-10-CM | POA: Diagnosis not present

## 2014-08-28 DIAGNOSIS — J3089 Other allergic rhinitis: Secondary | ICD-10-CM | POA: Diagnosis not present

## 2014-09-02 DIAGNOSIS — J3089 Other allergic rhinitis: Secondary | ICD-10-CM | POA: Diagnosis not present

## 2014-09-06 DIAGNOSIS — J3089 Other allergic rhinitis: Secondary | ICD-10-CM | POA: Diagnosis not present

## 2014-09-11 DIAGNOSIS — J3089 Other allergic rhinitis: Secondary | ICD-10-CM | POA: Diagnosis not present

## 2014-09-11 DIAGNOSIS — J3081 Allergic rhinitis due to animal (cat) (dog) hair and dander: Secondary | ICD-10-CM | POA: Diagnosis not present

## 2014-09-13 DIAGNOSIS — J3089 Other allergic rhinitis: Secondary | ICD-10-CM | POA: Diagnosis not present

## 2014-09-20 DIAGNOSIS — J3089 Other allergic rhinitis: Secondary | ICD-10-CM | POA: Diagnosis not present

## 2014-09-25 DIAGNOSIS — J3089 Other allergic rhinitis: Secondary | ICD-10-CM | POA: Diagnosis not present

## 2014-09-26 ENCOUNTER — Telehealth: Payer: Self-pay | Admitting: Family

## 2014-09-26 DIAGNOSIS — E7800 Pure hypercholesterolemia, unspecified: Secondary | ICD-10-CM

## 2014-09-26 NOTE — Telephone Encounter (Signed)
Pt has enrolled in [facility] heart failure program.  In order for pt to continue this program, [facility] needs 4 questions answered.   1. confirm pt has a dx of heart failure i 2. Needs medications and office notes  3. latest a1c  and  4. Ejection fraction Donia Guiles will send a fax w/ this information.request

## 2014-09-26 NOTE — Telephone Encounter (Signed)
Spoke with pt and she states that she did not enroll in a heart failure program with [facility]. She will call the number provided tomorrow to see what this is about. She does want to keep the cardiology referral

## 2014-09-26 NOTE — Telephone Encounter (Signed)
Refer to cardiology for evaluation

## 2014-09-30 ENCOUNTER — Telehealth: Payer: Self-pay | Admitting: Family

## 2014-09-30 NOTE — Telephone Encounter (Signed)
New Message   Patient has a referral for a cardiology consult here @ [facility], Patient has been called 3 times and she states that she does not have any heart issues and does not know why she would need this appt. Patient also states that she will call the referring office to speak to them about this referral.  Patient has been taken out of the work queue and if they need to schedule an appt another referral would have to be put into EPIC.

## 2014-10-01 DIAGNOSIS — J3089 Other allergic rhinitis: Secondary | ICD-10-CM | POA: Diagnosis not present

## 2014-10-03 ENCOUNTER — Ambulatory Visit (INDEPENDENT_AMBULATORY_CARE_PROVIDER_SITE_OTHER): Payer: Medicare Other | Admitting: Adult Health

## 2014-10-03 ENCOUNTER — Encounter: Payer: Self-pay | Admitting: Adult Health

## 2014-10-03 VITALS — BP 104/62 | Temp 99.1°F | Ht 66.0 in | Wt 208.9 lb

## 2014-10-03 DIAGNOSIS — Z7189 Other specified counseling: Secondary | ICD-10-CM

## 2014-10-03 DIAGNOSIS — I10 Essential (primary) hypertension: Secondary | ICD-10-CM

## 2014-10-03 DIAGNOSIS — Z72820 Sleep deprivation: Secondary | ICD-10-CM | POA: Diagnosis not present

## 2014-10-03 DIAGNOSIS — M545 Low back pain, unspecified: Secondary | ICD-10-CM

## 2014-10-03 DIAGNOSIS — Z7689 Persons encountering health services in other specified circumstances: Secondary | ICD-10-CM

## 2014-10-03 MED ORDER — OLMESARTAN-AMLODIPINE-HCTZ 20-5-12.5 MG PO TABS: ORAL_TABLET | ORAL | Status: DC

## 2014-10-03 NOTE — Progress Notes (Signed)
HPI:  Kristin Merritt is here to establish care. She is a pleasant, non smoking, african [place] female. She presents with multiple complaints today, including pain management referral, sleep study, carpel tunnel pain and leg cramps.    Last PCP and physical: Jan 2016 with [facility] Doctor:Needs an eye doctor Dentist: Needs to find a dentist.    Has the following chronic problems that require follow up and concerns today:  HTN: Complaining of dizziness over the last couple of weeks. In the morning and throughout the day. No syncopal episodes. Denies feeling like the world is spinning. She does not monitor her blood pressures at home.   Would like a referral to for sleep study. She does not feel like she is sleeping well.She does not want to take any medication to help her sleep.    Would also like a referral to pain management for her lower back pain, hx of back surgery. Pain has been getting worse over the past 2-3 weeks Has been taking oxycodone but does not feel like it is working.    ROS negative for unless reported above: fevers, unintentional weight loss, hearing or vision loss, chest pain, palpitations, struggling to breath, hemoptysis, melena, hematochezia, hematuria, falls, loc, si, thoughts of self harm  Past Medical History  Diagnosis Date  . Allergy   . Anxiety   . Hypertension   . Lichen sclerosus et atrophicus   . 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 Postmenopausal HRT (hormone replacement therapy) - followed by Dr. Toney Rakes in gyn 07/11/2012  . Asthma   . Uterine cancer 1991  . Depression   . High cholesterol   . UTI (urinary tract infection)     Past Surgical History  Procedure Laterality Date  . Eye surgery    . Spine surgery      L5-s1  . Vaginal hysterectomy      Family History  Problem Relation Age of Onset  . Cancer Mother     LUNG   . Breast cancer Mother     History   Social History  . Marital  Status: Widowed    Spouse Name: N/A  . Number of Children: N/A  . Years of Education: N/A   Social History Main Topics  . Smoking status: Former Smoker -- 1.00 packs/day for 20 years    Types: Cigarettes    Quit date: 06/05/2012  . Smokeless tobacco: Never Used  . Alcohol Use: No  . Drug Use: No  . Sexual Activity: Yes    Birth Control/ Protection: Condom   Other Topics Concern  . 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  albuterol (PROVENTIL HFA;VENTOLIN HFA) 108 (90 BASE) MCG/ACT inhaler, Inhale 2 puffs into the lungs every 6 (six) hours as needed., Disp: , Rfl:  .  aspirin 81 MG tablet, Take 81 mg by mouth daily., Disp: , Rfl:  .  atorvastatin (LIPITOR) 20 MG tablet, TAKE 1 TABLET BY MOUTH EVERY DAY, Disp: 90 tablet, Rfl: 3 .  cholecalciferol (VITAMIN D) 1000 UNITS tablet, Take 1,000 Units by mouth daily., Disp: , Rfl:  .  clobetasol cream (TEMOVATE)  0.05 %, Apply 2 times weekly when necessary, Disp: 30 g, Rfl: 5 .  cyanocobalamin 100 MCG tablet, Take 500 mcg by mouth daily., Disp: , Rfl:  .  docusate sodium (COLACE) 50 MG capsule, Take by mouth as needed. Over the counter Equate stool softener, Disp: , Rfl:  .  estradiol (ESTRACE) 1 MG tablet, Take 1 tablet (1 mg total) by mouth daily., Disp: 90 tablet, Rfl: 4 .  fluticasone (FLONASE) 50 MCG/ACT nasal spray, Place 2 sprays into both nostrils daily., Disp: 16 g, Rfl: 6 .  Fluticasone-Salmeterol (ADVAIR) 250-50 MCG/DOSE AEPB, Inhale 1 puff into the lungs as needed., Disp: , Rfl:  .  KLOR-CON M20 20 MEQ tablet, TAKE 1 TABLET BY MOUTH EVERY DAY, Disp: 30 tablet, Rfl: 3 .  lansoprazole (PREVACID) 30 MG capsule, TAKE 1 CAPSULE BY MOUTH ONCE DAILY, Disp: 90 capsule, Rfl: 3 .  levocetirizine (XYZAL) 5 MG tablet, Take  5 mg by mouth every evening. , Disp: , Rfl:  .  montelukast (SINGULAIR) 10 MG tablet, Take 10 mg by mouth at bedtime., Disp: , Rfl:  .  oxyCODONE-acetaminophen (ROXICET) 5-325 MG per tablet, Take 1 tablet by mouth every 8 (eight) hours as needed for pain., Disp: 20 tablet, Rfl: 0 .  TRIBENZOR 40-5-12.5 MG TABS, TAKE 1 TABLET BY MOUTH EVERY DAY, Disp: 30 tablet, Rfl: 3 .  diazepam (VALIUM) 5 MG tablet, Use very sparingly for panic attack - not more then once in a day. (Patient not taking: Reported on 10/03/2014), Disp: 30 tablet, Rfl: 2 .  loratadine (CLARITIN) 10 MG tablet, Take 1 tablet (10 mg total) by mouth daily., Disp: 30 tablet, Rfl: 11  EXAM:  Filed Vitals:   10/03/14 1434  BP: 104/62  Temp: 99.1 F (37.3 C)    Body mass index is 33.73 kg/(m^2).  GENERAL: vitals reviewed and listed above, alert, oriented, appears well hydrated and in no acute distress. Obese  HEENT: atraumatic, conjunttiva clear, no obvious abnormalities on inspection of external nose and ears  NECK: no obvious masses on inspection. No thyroid enlargement  LUNGS: clear to auscultation bilaterally, no wheezes, rales or rhonchi, good air movement  CV: HRRR, no peripheral edema, slight murmur(?). No carotid bruit.   MS: moves all extremities without noticeable abnormality. No edema noted  Abd: soft/nontender/nondistended/normal bowel sounds   Skin: warm and dry, no rash   Neuro: CN II-XII intact, sensation and reflexes normal throughout, 5/5 muscle strength in bilateral upper and lower extremities. Normal finger to nose. Normal rapid alternating movements.  PSYCH: pleasant and cooperative, no obvious depression or anxiety  ASSESSMENT AND PLAN:  1. Essential hypertension - She is hypertensive in the office today. Complains of dizziness over the past few weeks. May be due to dehydration, but could also be due to HTN medication. Will decrease Tribenzor 40 mg to Tribenzor 20 mg.  - She is to monitor her  blood pressures at home and bring the log book to the office.  - If she notices her blood pressure is getting high, she can switch back to the Tribenzor 40mg .  - Follow up in one month - Basic metabolic panel  2. Encounter to establish care - Follow up in January for CPE - Follow up in one month to discuss other medical issues and check BP  3. Sleep concern  - Nocturnal polysomnography (NPSG); Future  4. Bilateral low back pain without sciatica  - Ambulatory referral to Pain Clinic    Discussed the following assessment and plan:  No diagnosis found. -We reviewed the PMH, PSH, FH, SH, Meds and Allergies. -We provided refills for any medications we will prescribe as needed. -We addressed current concerns per orders and patient instructions. -We have asked for records for pertinent exams, studies, vaccines and notes from previous providers. -We have advised patient to follow up per instructions below.   -Patient advised to return or notify a provider immediately if symptoms worsen or persist or new concerns arise.  There are no Patient Instructions on file for this visit.   [employer]

## 2014-10-03 NOTE — Patient Instructions (Signed)
It was a pleasure meeting you today. I have sent referrals in for pain management and a sleep study. They will be calling you for the appointments. Continue to work on [employer] and exercise. Follow up with me in January for a complete physical.   Please record your blood pressures and bring them with you to the next visit. Follow up with me in a month.

## 2014-10-04 LAB — BASIC METABOLIC PANEL
BUN: 10 mg/dL (ref 6–23)
CALCIUM: 9.3 mg/dL (ref 8.4–10.5)
CHLORIDE: 102 meq/L (ref 96–112)
CO2: 29 mEq/L (ref 19–32)
CREATININE: 0.91 mg/dL (ref 0.40–1.20)
GFR: 82.94 mL/min (ref >60.00)
GLUCOSE: 94 mg/dL (ref 70–99)
Potassium: 4 mEq/L (ref 3.5–5.1)
Sodium: 137 mEq/L (ref 135–145)

## 2014-10-07 ENCOUNTER — Other Ambulatory Visit: Payer: Self-pay | Admitting: Orthopaedic Surgery

## 2014-10-07 DIAGNOSIS — M549 Dorsalgia, unspecified: Secondary | ICD-10-CM

## 2014-10-11 DIAGNOSIS — J3089 Other allergic rhinitis: Secondary | ICD-10-CM | POA: Diagnosis not present

## 2014-10-23 ENCOUNTER — Ambulatory Visit (HOSPITAL_BASED_OUTPATIENT_CLINIC_OR_DEPARTMENT_OTHER): Payer: Medicare Other | Attending: Adult Health

## 2014-10-23 DIAGNOSIS — G4710 Hypersomnia, unspecified: Secondary | ICD-10-CM | POA: Diagnosis not present

## 2014-10-23 DIAGNOSIS — R0683 Snoring: Secondary | ICD-10-CM | POA: Insufficient documentation

## 2014-10-23 DIAGNOSIS — Z7689 Persons encountering health services in other specified circumstances: Secondary | ICD-10-CM

## 2014-10-23 DIAGNOSIS — G4730 Sleep apnea, unspecified: Secondary | ICD-10-CM | POA: Insufficient documentation

## 2014-10-24 ENCOUNTER — Other Ambulatory Visit: Payer: Medicare Other

## 2014-10-24 ENCOUNTER — Other Ambulatory Visit: Payer: Self-pay | Admitting: Family

## 2014-10-24 DIAGNOSIS — G4733 Obstructive sleep apnea (adult) (pediatric): Secondary | ICD-10-CM | POA: Diagnosis not present

## 2014-10-25 DIAGNOSIS — J3089 Other allergic rhinitis: Secondary | ICD-10-CM | POA: Diagnosis not present

## 2014-10-25 NOTE — Sleep Study (Signed)
   NAME: Kristin Merritt DATE OF BIRTH:  [birth year] MEDICAL RECORD NUMBER [id number]  LOCATION: [facility]  PHYSICIAN: Kathee Delton  DATE OF STUDY: 10/23/2014  SLEEP STUDY TYPE: Nocturnal Polysomnogram               REFERRING PHYSICIAN: Dorothyann Peng, NP  INDICATION FOR STUDY: Hypersomnia with sleep apnea  EPWORTH SLEEPINESS SCORE:  1 HEIGHT:    WEIGHT:      There is no weight on file to calculate BMI.  NECK SIZE: 16 in.  MEDICATIONS: Reviewed in the sleep record  SLEEP ARCHITECTURE: The patient had a total sleep time of 269 minutes with no slow-wave sleep and only 51 minutes of REM. Sleep onset latency was prolonged at 112 minutes, and REM onset was prolonged at 144 minutes. Sleep efficiency was poor at 66%.  RESPIRATORY DATA: The patient was found to have no obstructive apneas and 17 obstructive hypopneas, giving her an AHI of only 4 events per hour. The events were more prominent during REM, but were not positional. There was moderate snoring noted throughout.  OXYGEN DATA: There was oxygen desaturation as low as 87% transiently with the patient's obstructive events  CARDIAC DATA: No clinically significant arrhythmias were noted  MOVEMENT/PARASOMNIA: No significant limb movements or other abnormal behaviors were seen.  IMPRESSION/ RECOMMENDATION:    1) Small numbers of obstructive events which do not meet the AHI criteria for the obstructive sleep apnea syndrome. The patient did have moderate snoring, and should be encouraged to work aggressively on weight loss.    [place], American Board of Sleep Medicine  ELECTRONICALLY SIGNED ON:  10/25/2014, 8:40 AM [place] PH: [phone]   FX: [phone] [place]

## 2014-10-28 DIAGNOSIS — J3089 Other allergic rhinitis: Secondary | ICD-10-CM | POA: Diagnosis not present

## 2014-10-28 DIAGNOSIS — R05 Cough: Secondary | ICD-10-CM | POA: Diagnosis not present

## 2014-10-28 DIAGNOSIS — H1045 Other chronic allergic conjunctivitis: Secondary | ICD-10-CM | POA: Diagnosis not present

## 2014-11-01 ENCOUNTER — Telehealth: Payer: Self-pay | Admitting: Adult Health

## 2014-11-01 ENCOUNTER — Encounter: Payer: Self-pay | Admitting: Adult Health

## 2014-11-01 ENCOUNTER — Ambulatory Visit (INDEPENDENT_AMBULATORY_CARE_PROVIDER_SITE_OTHER): Payer: Medicare Other | Admitting: Adult Health

## 2014-11-01 VITALS — Temp 98.8°F | Ht 66.0 in | Wt 210.9 lb

## 2014-11-01 DIAGNOSIS — I10 Essential (primary) hypertension: Secondary | ICD-10-CM | POA: Diagnosis not present

## 2014-11-01 DIAGNOSIS — M545 Low back pain, unspecified: Secondary | ICD-10-CM

## 2014-11-01 DIAGNOSIS — Z760 Encounter for issue of repeat prescription: Secondary | ICD-10-CM | POA: Diagnosis not present

## 2014-11-01 DIAGNOSIS — Z09 Encounter for follow-up examination after completed treatment for conditions other than malignant neoplasm: Secondary | ICD-10-CM | POA: Diagnosis not present

## 2014-11-01 DIAGNOSIS — G8929 Other chronic pain: Secondary | ICD-10-CM

## 2014-11-01 MED ORDER — LOSARTAN POTASSIUM-HCTZ 50-12.5 MG PO TABS: 1.0000 | ORAL_TABLET | Freq: Every day | ORAL | Status: DC

## 2014-11-01 MED ORDER — CLOBETASOL PROPIONATE 0.05 % EX CREA: TOPICAL_CREAM | CUTANEOUS | Status: DC

## 2014-11-01 NOTE — Progress Notes (Signed)
Pre visit review using our clinic review tool, if applicable. No additional management support is needed unless otherwise documented below in the visit note. 

## 2014-11-01 NOTE — Patient Instructions (Addendum)
It was so great seeing you again! Have a great weekend.   I have sent two prescriptions to the pharmacy.   Please stop the Tribenzor and start the Hyzaar. Continue to monitor your blood pressure and send me a note in mychart with your results.   Fruits, veggies and lean meats + exercise = weight reduction. I want to see atleast 7 pounds off in the next two months- YOU CAN DO THIS!!!  Try [email or web address] to find a walking or exercise club.   Exercise to Lose Weight Exercise and a healthy diet may help you lose weight. Your doctor may suggest specific exercises. EXERCISE IDEAS AND TIPS  Choose low-cost things you enjoy doing, such as walking, bicycling, or exercising to workout videos.  Take stairs instead of the elevator.  Walk during your lunch break.  Park your car further away from work or school.  Go to a gym or an exercise class.  Start with 5 to 10 minutes of exercise each day. Build up to 30 minutes of exercise 4 to 6 days a week.  Wear shoes with good support and comfortable clothes.  Stretch before and after working out.  Work out until you breathe harder and your heart beats faster.  Drink extra water when you exercise.  Do not do so much that you hurt yourself, feel dizzy, or get very short of breath. Exercises that burn about 150 calories:  Running 1  miles in 15 minutes.  Playing volleyball for 45 to 60 minutes.  Washing and waxing a car for 45 to 60 minutes.  Playing touch football for 45 minutes.  Walking 1  miles in 35 minutes.  Pushing a stroller 1  miles in 30 minutes.  Playing basketball for 30 minutes.  Raking leaves for 30 minutes.  Bicycling 5 miles in 30 minutes.  Walking 2 miles in 30 minutes.  Dancing for 30 minutes.  Shoveling snow for 15 minutes.  Swimming laps for 20 minutes.  Walking up stairs for 15 minutes.  Bicycling 4 miles in 15 minutes.  Gardening for 30 to 45 minutes.  Jumping rope for 15 minutes.  Washing windows  or floors for 45 to 60 minutes. Document Released: 06/12/2010 Document Revised: 08/02/2011 Document Reviewed: 06/12/2010 [facility] Patient Information 2015 [place], [place]. This information is not intended to replace advice given to you by your health care provider. Make sure you discuss any questions you have with your health care provider. Calorie Counting for Weight Loss Calories are energy you get from the things you eat and drink. Your body uses this energy to keep you going throughout the day. The number of calories you eat affects your weight. When you eat more calories than your body needs, your body stores the extra calories as fat. When you eat fewer calories than your body needs, your body burns fat to get the energy it needs. Calorie counting means keeping track of how many calories you eat and drink each day. If you make sure to eat fewer calories than your body needs, you should lose weight. In order for calorie counting to work, you will need to eat the number of calories that are right for you in a day to lose a healthy amount of weight per week. A healthy amount of weight to lose per week is usually 1-2 lb (0.5-0.9 kg). A dietitian can determine how many calories you need in a day and give you suggestions on how to reach your calorie goal.  WHAT IS MY  MY PLAN? My goal is to have __________ calories per day.  If I have this many calories per day, I should lose around __________ pounds per week. WHAT DO I NEED TO KNOW ABOUT CALORIE COUNTING? In order to meet your daily calorie goal, you will need to:  Find out how many calories are in each food you would like to eat. Try to do this before you eat.  Decide how much of the food you can eat.  Write down what you ate and how many calories it had. Doing this is called keeping a food log. WHERE DO I FIND CALORIE INFORMATION? The number of calories in a food can be found on a Nutrition Facts label. Note that all the information on a label is  based on a specific serving of the food. If a food does not have a Nutrition Facts label, try to look up the calories online or ask your dietitian for help. HOW DO I DECIDE HOW MUCH TO EAT? To decide how much of the food you can eat, you will need to consider both the number of calories in one serving and the size of one serving. This information can be found on the Nutrition Facts label. If a food does not have a Nutrition Facts label, look up the information online or ask your dietitian for help. Remember that calories are listed per serving. If you choose to have more than one serving of a food, you will have to multiply the calories per serving by the amount of servings you plan to eat. For example, the label on a package of bread might say that a serving size is 1 slice and that there are 90 calories in a serving. If you eat 1 slice, you will have eaten 90 calories. If you eat 2 slices, you will have eaten 180 calories. HOW DO I KEEP A FOOD LOG? After each meal, record the following information in your food log:  What you ate.  How much of it you ate.  How many calories it had.  Then, add up your calories. Keep your food log near you, such as in a small notebook in your pocket. Another option is to use a mobile app or website. Some programs will calculate calories for you and show you how many calories you have left each time you add an item to the log. WHAT ARE SOME CALORIE COUNTING TIPS?  Use your calories on foods and drinks that will fill you up and not leave you hungry. Some examples of this include foods like nuts and nut butters, vegetables, lean proteins, and high-fiber foods (more than 5 g fiber per serving).  Eat nutritious foods and avoid empty calories. Empty calories are calories you get from foods or beverages that do not have many nutrients, such as candy and soda. It is better to have a nutritious high-calorie food (such as an avocado) than a food with few nutrients (such as a  bag of chips).  Know how many calories are in the foods you eat most often. This way, you do not have to look up how many calories they have each time you eat them.  Look out for foods that may seem like low-calorie foods but are really high-calorie foods, such as baked goods, soda, and fat-free candy.  Pay attention to calories in drinks. Drinks such as sodas, specialty coffee drinks, alcohol, and juices have a lot of calories yet do not fill you up. Choose low-calorie drinks like water  and diet drinks.  Focus your calorie counting efforts on higher calorie items. Logging the calories in a garden salad that contains only vegetables is less important than calculating the calories in a milk shake.  Find a way of tracking calories that works for you. Get creative. Most people who are successful find ways to keep track of how much they eat in a day, even if they do not count every calorie. WHAT ARE SOME PORTION CONTROL TIPS?  Know how many calories are in a serving. This will help you know how many servings of a certain food you can have.  Use a measuring cup to measure serving sizes. This is helpful when you start out. With time, you will be able to estimate serving sizes for some foods.  Take some time to put servings of different foods on your favorite plates, bowls, and cups so you know what a serving looks like.  Try not to eat straight from a bag or box. Doing this can lead to overeating. Put the amount you would like to eat in a cup or on a plate to make sure you are eating the right portion.  Use smaller plates, glasses, and bowls to prevent overeating. This is a quick and easy way to practice portion control. If your plate is smaller, less food can fit on it.  Try not to multitask while eating, such as watching TV or using your computer. If it is time to eat, sit down at a table and enjoy your food. Doing this will help you to start recognizing when you are full. It will also make you  more aware of what and how much you are eating. HOW CAN I CALORIE COUNT WHEN EATING OUT?  Ask for smaller portion sizes or child-sized portions.  Consider sharing an entree and sides instead of getting your own entree.  If you get your own entree, eat only half. Ask for a box at the beginning of your meal and put the rest of your entree in it so you are not tempted to eat it.  Look for the calories on the menu. If calories are listed, choose the lower calorie options.  Choose dishes that include vegetables, fruits, whole grains, low-fat dairy products, and lean protein. Focusing on smart food choices from each of the 5 food groups can help you stay on track at restaurants.  Choose items that are boiled, broiled, grilled, or steamed.  Choose water, milk, unsweetened iced tea, or other drinks without added sugars. If you want an alcoholic beverage, choose a lower calorie option. For example, a regular margarita can have up to 700 calories and a glass of wine has around 150.  Stay away from items that are buttered, battered, fried, or served with cream sauce. Items labeled "crispy" are usually fried, unless stated otherwise.  Ask for dressings, sauces, and syrups on the side. These are usually very high in calories, so do not eat much of them.  Watch out for salads. Many people think salads are a healthy option, but this is often not the case. Many salads come with bacon, fried chicken, lots of cheese, fried chips, and dressing. All of these items have a lot of calories. If you want a salad, choose a garden salad and ask for grilled meats or steak. Ask for the dressing on the side, or ask for olive oil and vinegar or lemon to use as dressing.  Estimate how many servings of a food you are given. For  example, a serving of cooked rice is  cup or about the size of half a tennis ball or one cupcake wrapper. Knowing serving sizes will help you be aware of how much food you are eating at restaurants.  The list below tells you how big or small some common portion sizes are based on everyday objects.  1 oz--4 stacked dice.  3 oz--1 deck of cards.  1 tsp--1 dice.  1 Tbsp-- a Ping-Pong ball.  2 Tbsp--1 Ping-Pong ball.   cup--1 tennis ball or 1 cupcake wrapper.  1 cup--1 baseball. Document Released: 05/10/2005 Document Revised: 09/24/2013 Document Reviewed: 03/15/2013 [facility] Patient Information 2015 [place], [place]. This information is not intended to replace advice given to you by your health care provider. Make sure you discuss any questions you have with your health care provider.

## 2014-11-01 NOTE — Progress Notes (Signed)
Subjective:    Patient ID: Kristin Merritt, female    DOB: 07/07/1960, 54 y.o.   MRN: [id number]  HPI  [name] is here for follow up regarding her sleep study results, and blood pressure.    She has had a sleep study performed since our last visit. Sleep study showed Small numbers of obstructive events which do not meet the AHI criteria for the obstructive sleep apnea syndrome. The patient did have moderate snoring, and should be encouraged to work aggressively on weight loss.   She has been monitoring her blood pressure at home. She continues to range between 950- 932 systolic and 67'T diastolic. She continues to complain of dizziness due to lower than normal BP.   She does have a MRI scheduled for June 27th for her lower back pain. She would like a referral to PT or the pain clinic. She does not want to take any medication for the pain.   She has lost 8 pounds in the last three months.   Review of Systems  Constitutional: Negative.   HENT: Negative.   Eyes: Negative.   Respiratory: Negative.   Cardiovascular: Negative.   Gastrointestinal: Negative.   Endocrine: Negative.   Genitourinary: Negative.   Musculoskeletal: Negative.   Skin: Negative.   Allergic/Immunologic: Negative.   Neurological: Positive for dizziness and light-headedness. Negative for headaches.  Hematological: Negative.   Psychiatric/Behavioral: Negative.   All other systems reviewed and are negative.       Past Medical History  Diagnosis Date  . Allergy   . Anxiety   . Hypertension   . Lichen sclerosus et atrophicus   . 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 Postmenopausal HRT (hormone replacement therapy) - followed by Dr. Toney Rakes in gyn 07/11/2012  . Asthma   . Uterine cancer 1991  . Depression   . High cholesterol   . UTI (urinary tract infection)     History   Social History  . Marital Status: Widowed    Spouse Name: N/A  . Number of Children: N/A  .  Years of Education: N/A   Occupational History  . Not on file.   Social History Main Topics  . Smoking status: Former Smoker -- 1.00 packs/day for 20 years    Types: Cigarettes    Quit date: 06/05/2012  . Smokeless tobacco: Never Used  . Alcohol Use: No  . Drug Use: No  . Sexual Activity: Yes    Birth Control/ Protection: Condom   Other Topics Concern  . 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 Enjoys walking   Diet: Tries to watch what she eats. Does not eat a lot of processed foods or fast food.     Past Surgical History  Procedure Laterality Date  . Eye surgery      "Lazy Muscle"  . Spine surgery      L5-s1  . Vaginal hysterectomy      Family History  Problem Relation Age of Onset  . Cancer Mother     LUNG   . Breast cancer Mother     Died    No Known Allergies  Current Outpatient Prescriptions on File Prior to Visit  Medication Sig Dispense  Refill  . albuterol (PROVENTIL HFA;VENTOLIN HFA) 108 (90 BASE) MCG/ACT inhaler Inhale 2 puffs into the lungs every 6 (six) hours as needed.    Marland Kitchen aspirin 81 MG tablet Take 81 mg by mouth daily.    Marland Kitchen atorvastatin (LIPITOR) 20 MG tablet TAKE 1 TABLET BY MOUTH EVERY DAY 90 tablet 3  . cholecalciferol (VITAMIN D) 1000 UNITS tablet Take 1,000 Units by mouth daily.    . clobetasol cream (TEMOVATE) 0.05 % Apply 2 times weekly when necessary 30 g 5  . cyanocobalamin 100 MCG tablet Take 500 mcg by mouth daily.    . diazepam (VALIUM) 5 MG tablet Use very sparingly for panic attack - not more then once in a day. 30 tablet 2  . docusate sodium (COLACE) 50 MG capsule Take by mouth as needed. Over the counter Equate stool softener    . estradiol (ESTRACE) 1 MG tablet Take 1 tablet (1 mg total) by mouth daily. 90 tablet 4  . fluticasone (FLONASE) 50 MCG/ACT nasal spray Place 2 sprays  into both nostrils daily. 16 g 6  . Fluticasone-Salmeterol (ADVAIR) 250-50 MCG/DOSE AEPB Inhale 1 puff into the lungs as needed.    Marland Kitchen KLOR-CON M20 20 MEQ tablet TAKE 1 TABLET BY MOUTH EVERY DAY 30 tablet 4  . lansoprazole (PREVACID) 30 MG capsule TAKE 1 CAPSULE BY MOUTH ONCE DAILY 90 capsule 3  . levocetirizine (XYZAL) 5 MG tablet Take 5 mg by mouth every evening.     . loratadine (CLARITIN) 10 MG tablet Take 1 tablet (10 mg total) by mouth daily. 30 tablet 11  . montelukast (SINGULAIR) 10 MG tablet Take 10 mg by mouth at bedtime.    . Olmesartan-Amlodipine-HCTZ (TRIBENZOR) 20-5-12.5 MG TABS Take one pill daily for blood pressure 30 tablet 1   No current facility-administered medications on file prior to visit.    Temp(Src) 98.8 F (37.1 C) (Oral)  Ht 5\' 6"  (1.676 m)  Wt 210 lb 14.4 oz (95.664 kg)  BMI 34.06 kg/m2     Objective:   Physical Exam  Constitutional: She is oriented to person, place, and time. She appears well-developed and well-nourished. No distress.  HENT:  Head: Normocephalic and atraumatic.  Right Ear: External ear normal.  Left Ear: External ear normal.  Nose: Nose normal.  Mouth/Throat: Oropharynx is clear and moist. No oropharyngeal exudate.  Eyes: Conjunctivae are normal. Pupils are equal, round, and reactive to light.  Neck: Neck supple.  Cardiovascular: Normal rate, regular rhythm, normal heart sounds and intact distal pulses.  Exam reveals no gallop and no friction rub.   No murmur heard. Pulmonary/Chest: Effort normal and breath sounds normal. No respiratory distress. She has no wheezes. She has no rales. She exhibits no tenderness.  Musculoskeletal: Normal range of motion. She exhibits no edema.  Lymphadenopathy:    She has no cervical adenopathy.  Neurological: She is alert and oriented to person, place, and time. She has normal reflexes.  Skin: Skin is warm and dry. No rash noted. She is not diaphoretic. No erythema. No pallor.  Psychiatric: She has  a normal mood and affect. Her behavior is normal. Judgment and thought content normal.  Nursing note and vitals reviewed.     Assessment & Plan:   1. Follow up - She appears to be doing well. Still has episodes of dizziness due to low blood pressure.  - Reviewed sleep study with patient.  - Follow up in two months.   2. Essential hypertension, benign - Discontinued Tribenzor -  losartan-hydrochlorothiazide (HYZAAR) 50-12.5 MG per tablet; Take 1 tablet by mouth daily.  Dispense: 30 tablet; Refill: 1 - Continue to monitor blood pressure.  - She will send me a Mychart message with updated blood pressure readings.  - She is wanting to change her lifestyle and lose weight. Information given on calorie counting and exercise programs in [place].   3. Medicine refill - clobetasol cream (TEMOVATE) 0.05 %; Apply 2 times weekly when necessary  Dispense: 30 g; Refill: 5  4. Chronic low back pain - Would like to see PT rather than pain clinic at this time.  - Ambulatory referral to Physical Therapy

## 2014-11-01 NOTE — Telephone Encounter (Signed)
Pt would like to get [place] prescription. Pt states she forgot to ask Pt would like a cb [facility]

## 2014-11-04 NOTE — Telephone Encounter (Signed)
I will not write her for Ambien due to it having the potential of becoming habit forming. If she would like a sleep aid she can try Belsomra. If she decided she wants to try this then I have a savings card for her and she will need to follow up with me in two weeks after starting it .

## 2014-11-05 ENCOUNTER — Other Ambulatory Visit: Payer: Self-pay | Admitting: Adult Health

## 2014-11-05 NOTE — Telephone Encounter (Signed)
Called and advised pt of the new medication and potential side effects.  Pt would like to try a natural remedy to assist with sleeping.  Per Tommi Rumps pt can try melatonin OTC.  Pt states she will try that first and then call back if needed.

## 2014-11-05 NOTE — Telephone Encounter (Signed)
Called and spoke with pt and pt is aware of Cory's recommendations. Pt state she will try the new medication Belsomra and she will pick up the savings card.   Per Tommi Rumps send in Belsomra 10 mg #10 and have pt report how she is sleeping,  If pt is having trouble then it may be increased to 15 mg.    Rx sent to pharmacy and I will update pt about recommendations and how to report back.

## 2014-11-05 NOTE — Addendum Note (Signed)
Addended by: Colleen Can on: 11/05/2014 10:49 AM   Modules accepted: Orders

## 2014-11-07 DIAGNOSIS — J3089 Other allergic rhinitis: Secondary | ICD-10-CM | POA: Diagnosis not present

## 2014-11-07 DIAGNOSIS — J301 Allergic rhinitis due to pollen: Secondary | ICD-10-CM | POA: Diagnosis not present

## 2014-11-09 ENCOUNTER — Other Ambulatory Visit: Payer: Self-pay | Admitting: Family

## 2014-11-12 ENCOUNTER — Telehealth: Payer: Self-pay | Admitting: Adult Health

## 2014-11-12 ENCOUNTER — Encounter: Payer: Self-pay | Admitting: Physical Therapy

## 2014-11-12 ENCOUNTER — Ambulatory Visit: Payer: Medicare Other | Attending: Adult Health | Admitting: Physical Therapy

## 2014-11-12 DIAGNOSIS — M545 Low back pain, unspecified: Secondary | ICD-10-CM

## 2014-11-12 DIAGNOSIS — M79604 Pain in right leg: Secondary | ICD-10-CM

## 2014-11-12 DIAGNOSIS — J3089 Other allergic rhinitis: Secondary | ICD-10-CM | POA: Diagnosis not present

## 2014-11-12 NOTE — Therapy (Signed)
[facility] [address] [place], [place], [address] Phone: [phone]   Fax:  [phone]  Physical Therapy Evaluation  Patient Details  Name: Kristin Merritt MRN: [id number] Date of Birth: 03-04-1961 Referring Provider:  Shirline Frees, NP  Encounter Date: 11/12/2014      PT End of Session - 11/12/14 1606    Visit Number 1   Number of Visits 10  Medicare   Date for PT Re-Evaluation 01/07/15   PT Start Time 1530   PT Stop Time 1620   PT Time Calculation (min) 50 min   Activity Tolerance Patient tolerated treatment well   Behavior During Therapy [facility] for tasks assessed/performed      Past Medical History  Diagnosis Date  . Allergy   . Anxiety   . Hypertension   . Lichen sclerosus et atrophicus   . 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 Postmenopausal HRT (hormone replacement therapy) - followed by Dr. Lily Peer in gyn 07/11/2012  . Asthma   . Uterine cancer 1991  . Depression   . High cholesterol   . UTI (urinary tract infection)     Past Surgical History  Procedure Laterality Date  . Eye surgery      "Lazy Muscle"  . Spine surgery      L5-s1  . Vaginal hysterectomy      There were no vitals filed for this visit.  Visit Diagnosis:  Lumbar pain with radiation down both legs - Plan: PT plan of care cert/re-cert      Subjective Assessment - 11/12/14 1531    Subjective Patient reports lumbar surgery 1996 to repair a ruptured disc.  Patient reports she is in need of a fusion but MD prefers her to try medication first. Patient reports her last episode of back pain is 11/12/2014. Patient had physical therapy in the past. Patient reports the electricla stimulation helps.    How long can you sit comfortably? 15 min.    How long can you stand comfortably? 15 min.    How long can you walk comfortably? 30 min.    Patient Stated Goals reduce pain when it is intense   Currently in Pain? Yes   Pain Score 8    Pain Location Back   Pain Orientation Lower   Pain Descriptors / Indicators Sharp;Throbbing  numbness in both legs, tingling in both feet   Pain Type Chronic pain   Pain Radiating Towards toward both feet and sometimes right worse than left.    Pain Onset More than a month ago   Pain Frequency Intermittent   Aggravating Factors  sitting, standing, pushing, sleep   Pain Relieving Factors sitting up with legs stretched out    Effect of Pain on Daily Activities all activities   Multiple Pain Sites No            [facility] PT Assessment - 11/12/14 0001    Assessment   Medical Diagnosis M54.5, G89.29 Chronic Low Back pain   Onset Date/Surgical Date 11/12/14   Prior Therapy yes   Precautions   Precautions Back   Precaution Comments No lifting over 5 pounds, No ultrasound   Balance Screen   Has the patient fallen in the past 6 months No   Has the patient had a decrease in activity level because of a fear of falling?  No   Is the patient reluctant to leave their home because of a fear of  falling?  No   Prior Function   Level of Independence Independent   Observation/Other Assessments   Focus on Therapeutic Outcomes (FOTO)  48% limitation   Posture/Postural Control   Posture/Postural Control Postural limitations   Postural Limitations Rounded Shoulders;Forward head;Decreased lumbar lordosis;Flexed trunk   ROM / Strength   AROM / PROM / Strength AROM;Strength;PROM   AROM   AROM Assessment Site Lumbar   Lumbar Flexion decreased by 25%   Lumbar Extension decreased by 75%   Lumbar - Right Side Bend decreased by 75%   Lumbar - Left Side Bend decreased by 75%   PROM   Overall PROM Comments bilateral hip ER 50 degrees    Strength   Overall Strength Comments bilateral hip strength is 4/5, abdominal strength is 3/5   Palpation   Palpation comment pelvis in correct alignment; tenderness located in bilateral gluteals and lumbar paraspinals    Special Tests    Special Tests --   Lumbar Tests Straight Leg Raise   Straight Leg Raise   Findings Negative   Side  Right   Comment patient felt back pain at 70 degrees                   [facility] Adult PT Treatment/Exercise - 11/12/14 0001    Modalities   Modalities Electrical Stimulation;Moist Heat   Moist Heat Therapy   Number Minutes Moist Heat 20 Minutes   Moist Heat Location Lumbar Spine  prone   Electrical Stimulation   Electrical Stimulation Location lumbar   Electrical Stimulation Action IFC   Electrical Stimulation Parameters to patient tolerance   Electrical Stimulation Goals Pain                PT Education - 11/12/14 1702    Education provided No          PT Short Term Goals - 11/12/14 1708    PT SHORT TERM GOAL #1   Title understand correct body mechanics to pervent injury to spine   Time 3   Period Weeks   Status New   PT SHORT TERM GOAL #2   Title move in bed with >/= 25% great ease due to increased flexibility   Time 3   Period Weeks   Status New           PT Long Term Goals - 11/12/14 1709    PT LONG TERM GOAL #1   Title understand how to pick light items up with correct body mechanics to decrease strain on lumbar   Time 6   Period Weeks   Status New   PT LONG TERM GOAL #2   Title move in bed with >/= 50% greater ease    Time 6   Period Weeks   Status New   PT LONG TERM GOAL #3   Title understand different ways to manage her pain with exercise program and home TENS unit   Time 6   Period Weeks   Status New   PT LONG TERM GOAL #4   Title pain during daily activities are minimal to moderate level   Time 6   Period Weeks   Status New               Plan - 11/12/14 1702    Clinical Impression Statement Patient is a 54 year old female with diagnosis of chronic low back pain with recent flare-up on 11/12/2014.  Patient reports she had lumbar surgery in the past and physical therapy.  Patient reports lumbar pain  at level 8/10 intermittenly.  Numbness in bilateral lower extremities intermittently and tingling in bilateral feet. Patient  lumbar ROM decreased by 75%. Bilateral hip strength is 4/5 and  abdominal strength is 2/5. FOTO score is 48% limitation.  Patient has difficulty with standing, lifting, sitting, and househols tasks due to pain. Patient reports she has a 5 pound lifting limit. Patient would benefit  from physical therapy to improve trunk and hip strength, learn how to manage and reduce pain and learn correct body mechanics.    Pt will benefit from skilled therapeutic intervention in order to improve on the following deficits Decreased activity tolerance;Decreased mobility;Decreased strength;Pain;Increased muscle spasms;Decreased endurance;Decreased range of motion;Difficulty walking   Rehab Potential Good   Clinical Impairments Affecting Rehab Potential None   PT Frequency 2x / week   PT Duration 6 weeks   PT Treatment/Interventions ADLs/Self Care Home Management;Cryotherapy;Electrical Stimulation;Moist Heat;Therapeutic activities;Functional mobility training;Therapeutic exercise;Neuromuscular re-education;Patient/family education;Energy conservation  Home TENS unit   PT Next Visit Plan body mechanics with daily tasks, stimulation and heat, soft tissue work, abdominal bracing   PT Home Exercise Plan abdominal bracing   Recommended Other Services None          G-Codes - 30-Nov-2014 1526    Functional Assessment Tool Used FOTO score is 48% limitation  goal is 42%   Functional Limitation Other PT primary   Other PT Primary Current Status (X9147) At least 40 percent but less than 60 percent impaired, limited or restricted   Other PT Primary Goal Status (W2956) At least 40 percent but less than 60 percent impaired, limited or restricted       Problem List Patient Active Problem List   Diagnosis Date Noted  . Lichen sclerosus 04/08/2014  . History of vulvar dysplasia 04/08/2014  . Asthma,  chronic 08/21/2012  . Essential hypertension, benign 08/21/2012  . Hyperlipemia 08/21/2012  . Anxiety 08/21/2012  . Chronic low back pain 08/21/2012  . Allergic rhinitis 08/21/2012  . Recurrent UTI after sex, uses prophylactic cipro with diflucan for prevention yeast infection 08/21/2012  . Symptoms, such as flushing, sleeplessness, headache, lack of concentration, associated with the menopause 07/11/2012  . Postmenopausal HRT (hormone replacement therapy) - followed by Dr. Lily Peer in gyn 07/11/2012    Shameer Molstad,PT 2014/11/30, 5:14 PM  [facility] [address], [address] [place], [place], [address] Phone: [phone]   Fax:  [phone]

## 2014-11-12 NOTE — Telephone Encounter (Signed)
Pt's Tribenzor has been lowered to 20 mg from 40mg , she needs a new Rx called [facility] with her new dose. Also needs to know how much Melatonin she needs to be taking as well.

## 2014-11-12 NOTE — Telephone Encounter (Signed)
Her losartan-HCTZ should be at the pharmacy. I sent a script in for 30 days with one refill and then she is to follow up regarding her blood pressure in two months.   She can start off with 1-3 mg of Melatonin

## 2014-11-12 NOTE — Telephone Encounter (Signed)
Called and spoke with pt and pt is aware.  Pt states she did not know the name of her bp medication. Advised pt that I would call the pharmacy to d/c Tribenzor.  Pt verbalized understanding for the dose of Melatonin.  Called the pharmacy and spoke with an associate to have medication discontinued and was told that pt picked up new rx on 6.10.2016 and she has 1 refill.

## 2014-11-12 NOTE — Telephone Encounter (Signed)
Per your last note it looks like Tribenzor was d/c and pt was started on losartan-hctz 50-12.5.  Pls advise on amount of Melatonin.

## 2014-11-14 ENCOUNTER — Ambulatory Visit: Payer: Medicare Other | Admitting: Physical Therapy

## 2014-11-14 ENCOUNTER — Encounter: Payer: Self-pay | Admitting: Physical Therapy

## 2014-11-14 DIAGNOSIS — M545 Low back pain, unspecified: Secondary | ICD-10-CM

## 2014-11-14 DIAGNOSIS — M79604 Pain in right leg: Secondary | ICD-10-CM

## 2014-11-14 NOTE — Therapy (Addendum)
[facility] [address], [address] [place], [place], [address] Phone: [phone]   Fax:  [phone]  Physical Therapy Treatment  Patient Details  Name: Kristin Merritt MRN: [id number] Date of Birth: 1960-11-06 Referring Provider:  Shirline Frees, NP  Encounter Date: 11/14/2014      PT End of Session - 11/14/14 1451    Visit Number 2   Number of Visits 10  Medicare   Date for PT Re-Evaluation 01/07/15   PT Start Time 1445   PT Stop Time 1535   PT Time Calculation (min) 50 min   Activity Tolerance Patient tolerated treatment well   Behavior During Therapy [facility] for tasks assessed/performed      Past Medical History  Diagnosis Date  . Allergy   . Anxiety   . Hypertension   . Lichen sclerosus et atrophicus   . 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 Postmenopausal HRT (hormone replacement therapy) - followed by Dr. Lily Peer in gyn 07/11/2012  . Asthma   . Uterine cancer 1991  . Depression   . High cholesterol   . UTI (urinary tract infection)     Past Surgical History  Procedure Laterality Date  . Eye surgery      "Lazy Muscle"  . Spine surgery      L5-s1  . Vaginal hysterectomy      There were no vitals filed for this visit.  Visit Diagnosis:  Lumbar pain with radiation down both legs      Subjective Assessment - 11/14/14 1500    Subjective I have a MRI on 11/18/2014.    Limitations Sitting;Standing;Walking   How long can you sit comfortably? 15 min.    How long can you stand comfortably? 15 min.    How long can you walk comfortably? 30 min.    Patient Stated Goals reduce pain when it is intense   Currently in Pain? Yes   Pain Score 7    Pain Location Back   Pain Orientation Lower   Pain Descriptors / Indicators Sharp;Throbbing   Pain Type Chronic pain   Pain Radiating Towards toward both feet and sometimes right worse than left   Pain Onset More than a month ago   Pain Frequency Intermittent   Aggravating Factors  sitting, standing, pushing, sleep   Pain Relieving Factors sitting up with legs stretched out   Multiple Pain Sites No                         [facility] Adult PT Treatment/Exercise - 11/14/14 0001    Exercises   Exercises Lumbar   Lumbar Exercises: Aerobic   Stationary Bike level 2 7 min.    Lumbar Exercises: Supine   Ab Set 10 reps;5 seconds  towel roll under lumbar   Clam 20 reps  with abdominal bracing   Bent Knee Raise 20 reps  with abdominal bracing   Other Supine Lumbar Exercises alternate shoulder flexion 20x with abdominal bracing.   Modalities   Modalities Electrical Stimulation;Moist Heat   Moist Heat Therapy   Number Minutes Moist Heat 20 Minutes   Moist Heat Location Lumbar Spine  prone   Electrical Stimulation   Electrical Stimulation Location lumbar   Electrical Stimulation Action IFC   Electrical Stimulation Parameters to patient tolerance   Electrical Stimulation Goals Pain      G-code is 48% limitation.  Functional limits is other primary  PT.  Goal status is CK.  Discharge status is CK.  Eulis Foster, PT 01/17/2015 7:57 AM            PT Education - 11/14/14 1518    Education provided Yes   Education Details back stabilization exercises   Person(s) Educated Patient   Methods Explanation;Demonstration;Tactile cues;Verbal cues;Handout   Comprehension Returned demonstration;Verbalized understanding          PT Short Term Goals - 11/12/14 1708    PT SHORT TERM GOAL #1   Title understand correct body mechanics to pervent injury to spine   Time 3   Period Weeks   Status New   PT SHORT TERM GOAL #2   Title move in bed with >/= 25% great ease due to increased flexibility   Time 3   Period Weeks   Status New           PT Long Term Goals - 11/12/14 1709    PT LONG TERM GOAL #1   Title understand how to pick light items up with correct body mechanics to decrease strain on  lumbar   Time 6   Period Weeks   Status New   PT LONG TERM GOAL #2   Title move in bed with >/= 50% greater ease    Time 6   Period Weeks   Status New   PT LONG TERM GOAL #3   Title understand different ways to manage her pain with exercise program and home TENS unit   Time 6   Period Weeks   Status New   PT LONG TERM GOAL #4   Title pain during daily activities are minimal to moderate level   Time 6   Period Weeks   Status New               Plan - 11/14/14 1521    Clinical Impression Statement Patient is learning abdominal bracing exercise to increase support in the lumbar spine.  Patient able to contract the abdominals but needed verbal cues to go slowly and breathe.  Patient is going to have a MRI of her lumbar to further assess her back. Patient benefits from physical therapy to learn exercises as her technique and pain monitored.  Patient has not met goals due to her  only attending the initial eval.    Pt will benefit from skilled therapeutic intervention in order to improve on the following deficits Decreased activity tolerance;Decreased mobility;Decreased strength;Pain;Increased muscle spasms;Decreased endurance;Decreased range of motion;Difficulty walking   Rehab Potential Good   Clinical Impairments Affecting Rehab Potential None   PT Frequency 2x / week   PT Duration 6 weeks   PT Treatment/Interventions ADLs/Self Care Home Management;Cryotherapy;Electrical Stimulation;Moist Heat;Therapeutic activities;Functional mobility training;Therapeutic exercise;Neuromuscular re-education;Patient/family education;Energy conservation   PT Next Visit Plan body mechanics with daily tasks, stimulation and heat, soft tissue work, abdominal bracing   PT Home Exercise Plan body mechanics   Consulted and Agree with Plan of Care Patient        Problem List Patient Active Problem List   Diagnosis Date Noted  . Lichen sclerosus 04/08/2014  . History of vulvar dysplasia 04/08/2014   . Asthma, chronic 08/21/2012  . Essential hypertension, benign 08/21/2012  . Hyperlipemia 08/21/2012  . Anxiety 08/21/2012  . Chronic low back pain 08/21/2012  . Allergic rhinitis 08/21/2012  . Recurrent UTI after sex, uses prophylactic cipro with diflucan for prevention yeast infection 08/21/2012  . Symptoms, such as flushing, sleeplessness, headache, lack of concentration, associated with  the menopause 07/11/2012  . Postmenopausal HRT (hormone replacement therapy) - followed by Dr. Lily Peer in gyn 07/11/2012    Eldora Napp,PT 11/14/2014, 3:27 PM  [facility] [address] [place], [place], [address] Phone: [phone]   Fax:  [phone]  PHYSICAL THERAPY DISCHARGE SUMMARY  Visits from Start of Care: 2 Current functional level related to goals / functional outcomes: Patient did not return after second visit therefore unable to asssess patient.    Remaining deficits: See above.   Education / Equipment: HEP  Plan: Patient agrees to discharge.  Patient goals were not met. Patient is being discharged due to not returning since the last visit. Thank you for the referral. Eulis Foster, PT 01/17/2015 7:58 AM   ?????

## 2014-11-14 NOTE — Patient Instructions (Signed)
Abdominal Bracing With Pelvic Floor (Hook-Lying)   With neutral spine, tighten pelvic floor and abdominals. Hold 5 sec.  Repeat _10__ times. Do _1__ times a day.   Copyright  VHI. All rights reserved.  Bracing With Knee Fallout (Hook-Lying)   With neutral spine, tighten pelvic floor and abdominals and hold. Alternating legs, drop knee out to side. Keep opposite hip still. Repeat _10__ times. Do ___ times a day.   Copyright  VHI. All rights reserved.  Bracing With Leg March (Hook-Lying)   With neutral spine, tighten pelvic floor and abdominals and hold. Alternating legs, lift foot _6__ inches and return to floor. Repeat _10__ times. Do _1__ times a day.   Copyright  VHI. All rights reserved.  Bracing With Arm Lift (Hook-Lying)   With neutral spine, tighten pelvic floor and abdominals and hold. Alternating arms, raise over head and return to side. Repeat _10__ times. Do _1__ times a day.   Copyright  VHI. All rights reserved.  [facility] [address], [place], [place] [address] Phone # [phone] Fax [phone]

## 2014-11-18 ENCOUNTER — Ambulatory Visit
Admission: RE | Admit: 2014-11-18 | Discharge: 2014-11-18 | Disposition: A | Discharge status: Home / Self Care | Payer: PRIVATE HEALTH INSURANCE | Source: Ambulatory Visit | Attending: Orthopaedic Surgery | Admitting: Orthopaedic Surgery

## 2014-11-18 DIAGNOSIS — M549 Dorsalgia, unspecified: Secondary | ICD-10-CM

## 2014-11-22 DIAGNOSIS — J3089 Other allergic rhinitis: Secondary | ICD-10-CM | POA: Diagnosis not present

## 2014-11-27 ENCOUNTER — Encounter: Payer: Self-pay | Admitting: Physical Medicine & Rehabilitation

## 2014-11-29 DIAGNOSIS — J3089 Other allergic rhinitis: Secondary | ICD-10-CM | POA: Diagnosis not present

## 2014-12-05 DIAGNOSIS — J3089 Other allergic rhinitis: Secondary | ICD-10-CM | POA: Diagnosis not present

## 2014-12-12 ENCOUNTER — Encounter: Payer: Self-pay | Admitting: Physical Medicine & Rehabilitation

## 2014-12-13 DIAGNOSIS — J3089 Other allergic rhinitis: Secondary | ICD-10-CM | POA: Diagnosis not present

## 2014-12-17 ENCOUNTER — Encounter: Payer: Medicare Other | Attending: Physical Medicine & Rehabilitation

## 2014-12-17 ENCOUNTER — Other Ambulatory Visit: Payer: Self-pay | Admitting: *Deleted

## 2014-12-17 ENCOUNTER — Encounter: Payer: Self-pay | Admitting: Physical Medicine & Rehabilitation

## 2014-12-17 ENCOUNTER — Ambulatory Visit (HOSPITAL_BASED_OUTPATIENT_CLINIC_OR_DEPARTMENT_OTHER): Payer: Medicare Other | Admitting: Physical Medicine & Rehabilitation

## 2014-12-17 VITALS — BP 115/63 | HR 65 | Resp 14

## 2014-12-17 DIAGNOSIS — M4696 Unspecified inflammatory spondylopathy, lumbar region: Secondary | ICD-10-CM | POA: Diagnosis not present

## 2014-12-17 DIAGNOSIS — Z5181 Encounter for therapeutic drug level monitoring: Secondary | ICD-10-CM | POA: Diagnosis not present

## 2014-12-17 DIAGNOSIS — M79603 Pain in arm, unspecified: Secondary | ICD-10-CM

## 2014-12-17 DIAGNOSIS — Z9889 Other specified postprocedural states: Secondary | ICD-10-CM | POA: Diagnosis not present

## 2014-12-17 DIAGNOSIS — M961 Postlaminectomy syndrome, not elsewhere classified: Secondary | ICD-10-CM

## 2014-12-17 DIAGNOSIS — M79602 Pain in left arm: Secondary | ICD-10-CM

## 2014-12-17 DIAGNOSIS — M79601 Pain in right arm: Secondary | ICD-10-CM

## 2014-12-17 DIAGNOSIS — M545 Low back pain: Secondary | ICD-10-CM | POA: Diagnosis present

## 2014-12-17 DIAGNOSIS — M5126 Other intervertebral disc displacement, lumbar region: Secondary | ICD-10-CM | POA: Diagnosis not present

## 2014-12-17 DIAGNOSIS — M5417 Radiculopathy, lumbosacral region: Secondary | ICD-10-CM | POA: Diagnosis not present

## 2014-12-17 DIAGNOSIS — M5416 Radiculopathy, lumbar region: Secondary | ICD-10-CM

## 2014-12-17 DIAGNOSIS — R202 Paresthesia of skin: Secondary | ICD-10-CM

## 2014-12-17 DIAGNOSIS — Z79899 Other long term (current) drug therapy: Secondary | ICD-10-CM

## 2014-12-17 DIAGNOSIS — M5136 Other intervertebral disc degeneration, lumbar region: Secondary | ICD-10-CM | POA: Insufficient documentation

## 2014-12-17 MED ORDER — GABAPENTIN 100 MG PO CAPS: 100.0000 mg | ORAL_CAPSULE | Freq: Every day | ORAL | Status: DC

## 2014-12-17 NOTE — Progress Notes (Signed)
Subjective:    Patient ID: Kristin Merritt, female    DOB: 1960/10/17, 54 y.o.   MRN: [id number]  HPI  Chief complaint is low back pain radiating to the right bottom of the foot. 54 year old female with history of low back pain dating to prior to 1997. 1997 she underwent L5-S1 laminectomy. She had relief of symptoms for several years but around one year post operative she developed increasing pain once again. She then was evaluated by orthopedic surgeon as well as neurologist.  Patient states she has a lifting restriction imposed by orthopedic surgeon and neurologist of around 10 pounds. They recommended possible fusion although the thought was that she would like to try medication management first. Patient has been seeing her primary care physician over the years. Last prescription was for oxycodone 5 mg 20 tablets. She still has 5 tablets left and this is from August 2014. The patient states she has some history of substance abuse in her family and she would like to avoid getting hooked on anything. Patient has tried physical therapy with some improvements in the past. Patient usually walks 1-1/2 miles 3 days a week and takes her about 35 minutes to do so. With the recent heat wave she has not been able to do this recently.  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 Patient does not do lifting in this capacity.  Patient also has right hand numbness. Milder left hand and numbness. Patient had prior EMG test showing moderate to severe carpal tunnel on the right and mild to moderate on the left, never had carpal tunnel surgery. She is interested in having this rechecked because her symptoms have gotten worse  Patient also has right lower extremity numbness to her foot. She has to shake out her foot from time to time she also beats on it.  Pain Inventory Average Pain 8 Pain Right Now 7 My pain is constant, sharp and aching  In the last 24 hours, has pain interfered with the  following? General activity 7 Relation with others 7 Enjoyment of life 7 What TIME of day is your pain at its worst? morning, daytime Sleep (in general) Poor  Pain is worse with: bending, sitting, standing and some activites Pain improves with: rest, heat/ice, medication and TENS Relief from Meds: 2  Mobility walk without assistance how many minutes can you walk? 20 ability to climb steps?  yes do you drive?  yes Do you have any goals in this area?  yes  Function employed # of hrs/week 20  Neuro/Psych numbness tingling spasms  Prior Studies new visit EXAM: MRI LUMBAR SPINE WITHOUT CONTRAST   TECHNIQUE: Multiplanar, multisequence MR imaging of the lumbar spine was performed. No intravenous contrast was administered.   COMPARISON:  None.   FINDINGS: Segmentation: The numbering convention used for this exam termed L5-S1 as the last intervertebral disc space.   Alignment: Mild levoconvex curve of the lumbar spine which may be positional. The apex is at L4-L5.   Vertebrae: Vertebral body height is preserved. Degenerative endplate changes at W4-R1. Bone marrow signal shows heterogenous marrow. This is a nonspecific finding most commonly associated with obesity, anemia, cigarette smoking or chronic disease.   Conus medullaris: Normal termination at T12-L1.   Paraspinal tissues: Normal.   Disc levels:   Disc Signal: Disc desiccation at L3-L4 and L5-S1. Normal appearance of the other discs for age.   L1-L2:  Negative.   L2-L3:  Negative.   L3-L4: Mild distraction of  the facet joints. Bilateral facet degeneration, greater on the RIGHT than LEFT. High signal in the facet joints likely represents facet effusion. Periarticular edema is present on the RIGHT L3-L4 compatible with active facet arthritis. The disc is degenerated with shallow posterior disc bulging. Central canal and subarticular zones are patent. Neural foramina appear adequately patent.   L4-L5:  Mild disc degeneration and shallow posterior bulging. No stenosis. Mild RIGHT facet arthrosis.   L5-S1: Disc desiccation and degeneration with degenerative endplate changes. Findings compatible with old RIGHT laminotomy. Central canal appears patent. Small low signal structure is present lateral to the descending RIGHT S1 nerve (image 27 series 11) probably representing scarring in this postoperative patient. There is no compression of the descending S1 nerve root sleeve. The neural foramina appear patent.   IMPRESSION: 1. L3-L4 RIGHT-greater-than-LEFT facet degeneration with RIGHT facet arthritis. Shallow L3-L4 disc bulge without stenosis. 2. Postoperative changes of RIGHT L5 laminotomy. No recurrent stenosis. Low signal adjacent to the descending RIGHT S1 nerve probably represents scarring in the postoperative setting. Severe L5-S1 degenerative disc disease.     Electronically Signed   By: Dereck Ligas M.D.   On: 11/18/2014 14:02  Physicians involved in your care Primary care Dr. Carlisle Cater New visit   Family History  Problem Relation Age of Onset  . Cancer Mother     LUNG   . Breast cancer Mother     Died   History   Social History  . Marital Status: Widowed    Spouse Name: N/A  . Number of Children: N/A  . Years of Education: N/A   Social History Main Topics  . Smoking status: Former Smoker -- 1.00 packs/day for 20 years    Types: Cigarettes    Quit date: 06/05/2012  . Smokeless tobacco: Never Used  . Alcohol Use: No  . Drug Use: No  . Sexual Activity: Yes    Birth Control/ Protection: Condom   Other Topics Concern  . None   Social History Narrative   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 Enjoys walking   Diet: Tries to watch what she eats. Does not eat a lot of processed foods or fast food.    Past Surgical History  Procedure  Laterality Date  . Eye surgery      "Lazy Muscle"  . Spine surgery      L5-s1  . Vaginal hysterectomy     Past Medical History  Diagnosis Date  . Allergy   . Anxiety   . Hypertension   . Lichen sclerosus et atrophicus   . 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 Postmenopausal HRT (hormone replacement therapy) - followed by Dr. Toney Rakes in gyn 07/11/2012  . Asthma   . Uterine cancer 1991  . Depression   . High cholesterol   . UTI (urinary tract infection)    BP 115/63 mmHg  Pulse 65  Resp 14  SpO2 97%  Opioid Risk Score:   Fall Risk Score:  `1  Depression screen PHQ 2/9  Depression screen [facility] 2/9 12/17/2014 10/03/2014  Decreased Interest 3 0  Down, Depressed, Hopeless 0 0  PHQ - 2 Score 3 0  Altered sleeping 3 -  Tired, decreased energy 1 -  Change in appetite 0 -  Feeling bad or failure about yourself  0 -  Trouble concentrating 0 -  Moving slowly or fidgety/restless 0 -  Suicidal thoughts 0 -  PHQ-9 Score 7 -     Review of Systems  Constitutional:       Weight gain less active  Neurological: Positive for numbness.       Tingling Spasms   All other systems reviewed and are negative.      Objective:   Physical Exam  Constitutional: She is oriented to person, place, and time. She appears well-developed and well-nourished.  HENT:  Head: Normocephalic and atraumatic.  Eyes: Conjunctivae and EOM are normal. Pupils are equal, round, and reactive to light.  Neck: Normal range of motion. Neck supple.  Cardiovascular: Normal rate, regular rhythm and normal heart sounds.   No murmur heard. Pulmonary/Chest: Effort normal and breath sounds normal. No respiratory distress. She has no wheezes. She has no rales.  Abdominal: Soft. Bowel sounds are normal. There is no tenderness.  Musculoskeletal:       Right shoulder: She exhibits normal range of motion.       Left shoulder: She exhibits normal range of motion.       Right wrist:  Normal. She exhibits no tenderness and no effusion.       Left wrist: She exhibits normal range of motion, no bony tenderness and no effusion.       Lumbar back: She exhibits decreased range of motion and tenderness. She exhibits no deformity.  Patient with normal lumbar flexion Reduced lumbar extension with pain at around L3-4 area during extension. Negative straight leg raising test  Neurological: She is alert and oriented to person, place, and time. She has normal strength. No cranial nerve deficit or sensory deficit. Coordination normal. GCS eye subscore is 4. GCS verbal subscore is 5. GCS motor subscore is 6.  Reflex Scores:      Tricep reflexes are 2+ on the right side and 2+ on the left side.      Bicep reflexes are 2+ on the right side and 2+ on the left side.      Brachioradialis reflexes are 2+ on the right side and 2+ on the left side.      Patellar reflexes are 2+ on the right side and 2+ on the left side.      Achilles reflexes are 2+ on the right side and 2+ on the left side. Sensation normal bilateral C5 C6 C7 C8 T1 as well as L2-L3 L4 L5 S1 dermatomal distribution  Psychiatric: She has a normal mood and affect.  Nursing note and vitals reviewed.         Assessment & Plan:  1. 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 Independent with self-care and mobility.She has occasional severe pain in fact has only taken 15 oxycodone tablets in the last 2 years. She has tried milder narcotics such as tramadol without any significant improvement. Patient has significant pathology To warrant at least occasional narcotic analgesic usage Will need to check urine drug screen  In addition she does have radicular discomfort on a daily basis likely from L5-S1 level.We'll give a trial of gabapentin 100 mg daily at bedtime we'll gradually work up over time as needed  We reviewed the MRI results including the films. She is currently undergoing physical therapy which I  think is appropriate  2.Bilateral upper showed increased tingling she states she had some type of test which sounds like an EMG but I do  not have any report this was done several years ago. Her symptoms have progressed. Recommend repeat procedure to assess for median nerve compression at the wrist versus other pathology.  Discussed with patient agrees with plan

## 2014-12-17 NOTE — Patient Instructions (Signed)
He will start a medicine called gabapentin this should help with burning pain or shooting pain down the legs. We can start you on a grandma dose but we might have to go to to a larger dose.  In addition we will get she set up for an EMG/NCV this is to look at carpal tunnel and look at the severity.  Please continue the physical therapy  I agree about the way year taking the oxycodone taking it only 1. Pain is severe. If you need additional prescription we can prescribe this once we are able to review the urine drug screen

## 2014-12-18 ENCOUNTER — Other Ambulatory Visit: Payer: Self-pay | Admitting: Physical Medicine & Rehabilitation

## 2014-12-18 ENCOUNTER — Other Ambulatory Visit: Payer: Self-pay | Admitting: Family Medicine

## 2014-12-18 DIAGNOSIS — M961 Postlaminectomy syndrome, not elsewhere classified: Secondary | ICD-10-CM | POA: Diagnosis not present

## 2014-12-18 DIAGNOSIS — M5417 Radiculopathy, lumbosacral region: Secondary | ICD-10-CM | POA: Diagnosis not present

## 2014-12-18 DIAGNOSIS — I10 Essential (primary) hypertension: Secondary | ICD-10-CM

## 2014-12-18 DIAGNOSIS — M79603 Pain in arm, unspecified: Secondary | ICD-10-CM | POA: Diagnosis not present

## 2014-12-18 DIAGNOSIS — Z79899 Other long term (current) drug therapy: Secondary | ICD-10-CM | POA: Diagnosis not present

## 2014-12-18 DIAGNOSIS — Z5181 Encounter for therapeutic drug level monitoring: Secondary | ICD-10-CM | POA: Diagnosis not present

## 2014-12-18 DIAGNOSIS — R202 Paresthesia of skin: Secondary | ICD-10-CM | POA: Diagnosis not present

## 2014-12-18 MED ORDER — LOSARTAN POTASSIUM-HCTZ 50-12.5 MG PO TABS: 1.0000 | ORAL_TABLET | Freq: Every day | ORAL | Status: DC

## 2014-12-18 NOTE — Telephone Encounter (Signed)
Medication sent to pharmacy  

## 2014-12-18 NOTE — Addendum Note (Signed)
Addended by: Caro Hight on: 12/18/2014 10:08 AM   Modules accepted: Orders

## 2014-12-19 LAB — PRESCRIPTION MONITORING PROFILE (SOLSTAS)
Amphetamine/Meth: NEGATIVE ng/mL
BENZODIAZEPINE SCREEN, URINE: NEGATIVE ng/mL
BUPRENORPHINE, URINE: NEGATIVE ng/mL
Barbiturate Screen, Urine: NEGATIVE ng/mL
CANNABINOID SCRN UR: NEGATIVE ng/mL
CARISOPRODOL, URINE: NEGATIVE ng/mL
COCAINE METABOLITES: NEGATIVE ng/mL
Creatinine, Urine: 108.91 mg/dL (ref >20.0)
ECSTASY: NEGATIVE ng/mL
FENTANYL URINE: NEGATIVE ng/mL
Meperidine, Ur: NEGATIVE ng/mL
Methadone Screen, Urine: NEGATIVE ng/mL
Nitrites, Initial: NEGATIVE ug/mL
OPIATE SCREEN, URINE: NEGATIVE ng/mL
Oxycodone Screen, Ur: NEGATIVE ng/mL
Propoxyphene: NEGATIVE ng/mL
Tapentadol, urine: NEGATIVE ng/mL
Tramadol Scrn, Ur: NEGATIVE ng/mL
Zolpidem, Urine: NEGATIVE ng/mL
pH, Initial: 7 pH (ref 4.5–8.9)

## 2014-12-19 LAB — PMP ALCOHOL METABOLITE (ETG): Ethyl Glucuronide (EtG): NEGATIVE ng/mL

## 2014-12-20 DIAGNOSIS — J3089 Other allergic rhinitis: Secondary | ICD-10-CM | POA: Diagnosis not present

## 2014-12-24 DIAGNOSIS — J3089 Other allergic rhinitis: Secondary | ICD-10-CM | POA: Diagnosis not present

## 2014-12-27 ENCOUNTER — Ambulatory Visit: Payer: Medicare Other | Admitting: Adult Health

## 2014-12-27 DIAGNOSIS — J301 Allergic rhinitis due to pollen: Secondary | ICD-10-CM | POA: Diagnosis not present

## 2014-12-27 DIAGNOSIS — J3089 Other allergic rhinitis: Secondary | ICD-10-CM | POA: Diagnosis not present

## 2014-12-30 ENCOUNTER — Ambulatory Visit (INDEPENDENT_AMBULATORY_CARE_PROVIDER_SITE_OTHER): Payer: Medicare Other | Admitting: Adult Health

## 2014-12-30 ENCOUNTER — Encounter: Payer: Self-pay | Admitting: Adult Health

## 2014-12-30 VITALS — BP 120/72 | Temp 98.6°F | Ht 66.0 in | Wt 210.0 lb

## 2014-12-30 DIAGNOSIS — M5417 Radiculopathy, lumbosacral region: Secondary | ICD-10-CM | POA: Diagnosis not present

## 2014-12-30 DIAGNOSIS — I10 Essential (primary) hypertension: Secondary | ICD-10-CM

## 2014-12-30 DIAGNOSIS — M5416 Radiculopathy, lumbar region: Secondary | ICD-10-CM

## 2014-12-30 DIAGNOSIS — J3089 Other allergic rhinitis: Secondary | ICD-10-CM | POA: Diagnosis not present

## 2014-12-30 MED ORDER — LOSARTAN POTASSIUM-HCTZ 50-12.5 MG PO TABS: 1.0000 | ORAL_TABLET | Freq: Every day | ORAL | Status: DC

## 2014-12-30 NOTE — Patient Instructions (Signed)
It was great seeing you again!  Follow up with me in January for your next physical.

## 2014-12-30 NOTE — Progress Notes (Signed)
Pre visit review using our clinic review tool, if applicable. No additional management support is needed unless otherwise documented below in the visit note. 

## 2014-12-30 NOTE — Progress Notes (Signed)
Subjective:    Patient ID: Kristin Merritt, female    DOB: 1961/01/06, 54 y.o.   MRN: [id number]  HPI  54 year old female who presents to the office for follow up on BP and back pain.   Hypertension During her last visit we discontinued her Tribenzor and started her on Hyzaar 50-12.5. Her blood pressure is well controlled on current medication. She has been monitoring her BP at home, twice a day and they have been in the 120's/70's ( per log).    Back Pain She had a MRI on 11/18/2014 that showed   IMPRESSION: 1. L3-L4 RIGHT-greater-than-LEFT facet degeneration with RIGHT facet arthritis. Shallow L3-L4 disc bulge without stenosis. 2. Postoperative changes of RIGHT L5 laminotomy. No recurrent stenosis. Low signal adjacent to the descending RIGHT S1 nerve probably represents scarring in the postoperative setting. Severe L5-S1 degenerative disc disease.  She has a followed up with Pain Management and is scheduled to go back and see them in September.     Review of Systems  Constitutional: Negative.  Negative for fever and activity change.  HENT: Positive for congestion and sinus pressure. Negative for postnasal drip and rhinorrhea.   Eyes: Negative.   Respiratory: Negative.   Cardiovascular: Negative.   Gastrointestinal: Negative.   Musculoskeletal: Positive for myalgias, back pain and arthralgias. Negative for gait problem.  Neurological: Negative.    Past Medical History  Diagnosis Date  . Allergy   . Anxiety   . Hypertension   . Lichen sclerosus et atrophicus   . 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 Postmenopausal HRT (hormone replacement therapy) - followed by Dr. Toney Rakes in gyn 07/11/2012  . Asthma   . Uterine cancer 1991  . Depression   . High cholesterol   . UTI (urinary tract infection)     History   Social History  . Marital Status: Widowed    Spouse Name: N/A  . Number of Children: N/A  . Years of Education: N/A    Occupational History  . Not on file.   Social History Main Topics  . Smoking status: Former Smoker -- 1.00 packs/day for 20 years    Types: Cigarettes    Quit date: 06/05/2012  . Smokeless tobacco: Never Used  . Alcohol Use: No  . Drug Use: No  . Sexual Activity: Yes    Birth Control/ Protection: Condom   Other Topics Concern  . 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 Enjoys walking   Diet: Tries to watch what she eats. Does not eat a lot of processed foods or fast food.     Past Surgical History  Procedure Laterality Date  . Eye surgery      "Lazy Muscle"  . Spine surgery      L5-s1  . Vaginal hysterectomy      Family History  Problem Relation Age of Onset  . Cancer Mother     LUNG   . Breast cancer Mother     Died    No Known Allergies  Current Outpatient Prescriptions on File Prior to Visit  Medication Sig Dispense Refill  . albuterol (PROVENTIL HFA;VENTOLIN HFA) 108 (90 BASE) MCG/ACT inhaler Inhale 2 puffs into the lungs  every 6 (six) hours as needed.    Marland Kitchen aspirin 81 MG tablet Take 81 mg by mouth daily.    Marland Kitchen atorvastatin (LIPITOR) 20 MG tablet TAKE 1 TABLET BY MOUTH EVERY DAY 90 tablet 3  . cholecalciferol (VITAMIN D) 1000 UNITS tablet Take 1,000 Units by mouth daily.    . clobetasol cream (TEMOVATE) 0.05 % Apply 2 times weekly when necessary 30 g 5  . cyanocobalamin 100 MCG tablet Take 500 mcg by mouth daily.    Marland Kitchen docusate sodium (COLACE) 50 MG capsule Take by mouth as needed. Over the counter Equate stool softener    . estradiol (ESTRACE) 1 MG tablet Take 1 tablet (1 mg total) by mouth daily. 90 tablet 4  . fluticasone (FLONASE) 50 MCG/ACT nasal spray Place 2 sprays into both nostrils daily. 16 g 6  . Fluticasone-Salmeterol (ADVAIR) 250-50 MCG/DOSE AEPB Inhale 1 puff into the lungs as needed.    .  gabapentin (NEURONTIN) 100 MG capsule Take 1 capsule (100 mg total) by mouth at bedtime. 30 capsule 0  . KLOR-CON M20 20 MEQ tablet TAKE 1 TABLET BY MOUTH EVERY DAY 30 tablet 4  . KLOR-CON M20 20 MEQ tablet TAKE 1 TABLET BY MOUTH EVERY DAY 30 tablet 5  . lansoprazole (PREVACID) 30 MG capsule TAKE 1 CAPSULE BY MOUTH ONCE DAILY 90 capsule 3  . levocetirizine (XYZAL) 5 MG tablet Take 5 mg by mouth every evening.     . loratadine (CLARITIN) 10 MG tablet Take 1 tablet (10 mg total) by mouth daily. 30 tablet 11  . montelukast (SINGULAIR) 10 MG tablet Take 10 mg by mouth at bedtime.     No current facility-administered medications on file prior to visit.    BP 120/72 mmHg  Temp(Src) 98.6 F (37 C) (Oral)  Ht 5\' 6"  (1.676 m)  Wt 210 lb (95.255 kg)  BMI 33.91 kg/m2       Objective:   Physical Exam  Constitutional: She is oriented to person, place, and time. She appears well-developed and well-nourished. No distress.  Cardiovascular: Normal rate, regular rhythm, normal heart sounds and intact distal pulses.  Exam reveals no gallop and no friction rub.   No murmur heard. Pulmonary/Chest: Effort normal and breath sounds normal. No respiratory distress. She has no wheezes. She has no rales. She exhibits no tenderness.  Neurological: She is alert and oriented to person, place, and time. No cranial nerve deficit. Coordination normal.  Skin: Skin is warm and dry. No rash noted. She is not diaphoretic. No erythema. No pallor.  Psychiatric: She has a normal mood and affect. Her behavior is normal. Judgment and thought content normal.  Nursing note and vitals reviewed.       Assessment & Plan:  1. Essential hypertension, benign - losartan-hydrochlorothiazide (HYZAAR) 50-12.5 MG per tablet; Take 1 tablet by mouth daily.  Dispense: 80 tablet; Refill: 3 - Continue to periodically monitor BP at home  2. Right lumbar radiculitis - Continue to follow up with pain management.

## 2015-01-11 ENCOUNTER — Other Ambulatory Visit: Payer: Self-pay | Admitting: Family

## 2015-01-14 ENCOUNTER — Ambulatory Visit: Payer: Self-pay | Admitting: Physical Medicine & Rehabilitation

## 2015-01-15 NOTE — Progress Notes (Signed)
Urine drug screen for this encounter is consistent for no prescribed medication. 

## 2015-01-17 DIAGNOSIS — J3089 Other allergic rhinitis: Secondary | ICD-10-CM | POA: Diagnosis not present

## 2015-01-24 DIAGNOSIS — J3089 Other allergic rhinitis: Secondary | ICD-10-CM | POA: Diagnosis not present

## 2015-01-25 ENCOUNTER — Other Ambulatory Visit: Payer: Self-pay | Admitting: Physical Medicine & Rehabilitation

## 2015-01-30 ENCOUNTER — Ambulatory Visit (HOSPITAL_BASED_OUTPATIENT_CLINIC_OR_DEPARTMENT_OTHER): Payer: Medicare Other | Admitting: Physical Medicine & Rehabilitation

## 2015-01-30 ENCOUNTER — Encounter: Payer: Self-pay | Admitting: Physical Medicine & Rehabilitation

## 2015-01-30 ENCOUNTER — Encounter: Payer: Medicare Other | Attending: Physical Medicine & Rehabilitation

## 2015-01-30 VITALS — BP 119/69 | HR 67

## 2015-01-30 DIAGNOSIS — M4696 Unspecified inflammatory spondylopathy, lumbar region: Secondary | ICD-10-CM | POA: Diagnosis not present

## 2015-01-30 DIAGNOSIS — G5610 Other lesions of median nerve, unspecified upper limb: Secondary | ICD-10-CM

## 2015-01-30 DIAGNOSIS — M5136 Other intervertebral disc degeneration, lumbar region: Secondary | ICD-10-CM | POA: Insufficient documentation

## 2015-01-30 DIAGNOSIS — J3089 Other allergic rhinitis: Secondary | ICD-10-CM | POA: Diagnosis not present

## 2015-01-30 DIAGNOSIS — Z9889 Other specified postprocedural states: Secondary | ICD-10-CM | POA: Insufficient documentation

## 2015-01-30 DIAGNOSIS — M79642 Pain in left hand: Secondary | ICD-10-CM | POA: Diagnosis not present

## 2015-01-30 DIAGNOSIS — M545 Low back pain: Secondary | ICD-10-CM | POA: Diagnosis present

## 2015-01-30 DIAGNOSIS — M5126 Other intervertebral disc displacement, lumbar region: Secondary | ICD-10-CM | POA: Diagnosis not present

## 2015-01-30 DIAGNOSIS — M79641 Pain in right hand: Secondary | ICD-10-CM

## 2015-01-30 MED ORDER — METHYLPREDNISOLONE 4 MG PO TBPK: ORAL_TABLET | ORAL | Status: DC

## 2015-01-30 MED ORDER — OXYCODONE HCL 5 MG PO TABS: 5.0000 mg | ORAL_TABLET | Freq: Every day | ORAL | Status: DC | PRN

## 2015-01-30 NOTE — Patient Instructions (Signed)
Please wear your wrist splints every night.

## 2015-01-30 NOTE — Progress Notes (Signed)
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  Have recommended patient to use bilateral wrist splints at night Medrol dosepak called in If no better in 1 month we will do bilateral ultrasound-guided carpal tunnel injections

## 2015-02-06 ENCOUNTER — Other Ambulatory Visit: Payer: Self-pay | Admitting: Adult Health

## 2015-02-07 DIAGNOSIS — J3089 Other allergic rhinitis: Secondary | ICD-10-CM | POA: Diagnosis not present

## 2015-02-11 DIAGNOSIS — J3089 Other allergic rhinitis: Secondary | ICD-10-CM | POA: Diagnosis not present

## 2015-02-17 DIAGNOSIS — J3089 Other allergic rhinitis: Secondary | ICD-10-CM | POA: Diagnosis not present

## 2015-02-21 ENCOUNTER — Ambulatory Visit (INDEPENDENT_AMBULATORY_CARE_PROVIDER_SITE_OTHER): Payer: Medicare Other | Admitting: *Deleted

## 2015-02-21 DIAGNOSIS — Z23 Encounter for immunization: Secondary | ICD-10-CM | POA: Diagnosis not present

## 2015-02-21 DIAGNOSIS — J3089 Other allergic rhinitis: Secondary | ICD-10-CM | POA: Diagnosis not present

## 2015-02-25 DIAGNOSIS — J3089 Other allergic rhinitis: Secondary | ICD-10-CM | POA: Diagnosis not present

## 2015-02-28 ENCOUNTER — Ambulatory Visit: Payer: Medicare Other | Admitting: Physical Medicine & Rehabilitation

## 2015-03-03 ENCOUNTER — Telehealth: Payer: Self-pay | Admitting: Adult Health

## 2015-03-03 DIAGNOSIS — J3089 Other allergic rhinitis: Secondary | ICD-10-CM | POA: Diagnosis not present

## 2015-03-03 DIAGNOSIS — G8929 Other chronic pain: Secondary | ICD-10-CM

## 2015-03-03 DIAGNOSIS — M549 Dorsalgia, unspecified: Principal | ICD-10-CM

## 2015-03-03 NOTE — Telephone Encounter (Signed)
Patient came wanting to get a order for physical therapy. Patient states that they discharge her.

## 2015-03-03 NOTE — Telephone Encounter (Signed)
Pls advise.  

## 2015-03-03 NOTE — Telephone Encounter (Signed)
It looks like she was discharged because she did not return after the first visit. Is this for her lower back pain? If so, we can put the order for PT back in, but she needs to go.

## 2015-03-04 NOTE — Telephone Encounter (Signed)
Called and spoke with pt and advised of Cory's recommendations.  Pt states that she went to PT more than 1 time; she went 2 or 3 times.  She states she was told that they did not have any open appts but the office would contact pt. Pt states she got side tracked and did not think to call them when she did not receive a call. Ok per [employer] to reorder pt

## 2015-03-05 DIAGNOSIS — M722 Plantar fascial fibromatosis: Secondary | ICD-10-CM | POA: Diagnosis not present

## 2015-03-05 DIAGNOSIS — B351 Tinea unguium: Secondary | ICD-10-CM | POA: Diagnosis not present

## 2015-03-05 DIAGNOSIS — L602 Onychogryphosis: Secondary | ICD-10-CM | POA: Diagnosis not present

## 2015-03-06 ENCOUNTER — Other Ambulatory Visit: Payer: Self-pay | Admitting: Physical Medicine & Rehabilitation

## 2015-03-10 DIAGNOSIS — J3089 Other allergic rhinitis: Secondary | ICD-10-CM | POA: Diagnosis not present

## 2015-03-12 ENCOUNTER — Telehealth: Payer: Self-pay | Admitting: Adult Health

## 2015-03-12 MED ORDER — CLOTRIMAZOLE-BETAMETHASONE 1-0.05 % EX CREA: 1.0000 "application " | TOPICAL_CREAM | Freq: Two times a day (BID) | CUTANEOUS | Status: DC

## 2015-03-12 NOTE — Telephone Encounter (Signed)
Pt call to ask if Kristin Merritt will refill the following med   clotrimazole-betamethasone    Pt said Kristin Merritt had ordered her the following  clobetasol cream (TEMOVATE) 0.05 %  And this is not what she use    Pharmacy [facility]

## 2015-03-13 NOTE — Telephone Encounter (Signed)
Rx sent to pharmacy   

## 2015-03-18 DIAGNOSIS — J301 Allergic rhinitis due to pollen: Secondary | ICD-10-CM | POA: Diagnosis not present

## 2015-03-18 DIAGNOSIS — M722 Plantar fascial fibromatosis: Secondary | ICD-10-CM | POA: Diagnosis not present

## 2015-03-18 DIAGNOSIS — J3089 Other allergic rhinitis: Secondary | ICD-10-CM | POA: Diagnosis not present

## 2015-03-21 ENCOUNTER — Encounter: Payer: Self-pay | Admitting: Physical Therapy

## 2015-03-21 ENCOUNTER — Ambulatory Visit: Payer: Medicare Other | Attending: Adult Health | Admitting: Physical Therapy

## 2015-03-21 DIAGNOSIS — M5441 Lumbago with sciatica, right side: Secondary | ICD-10-CM

## 2015-03-21 DIAGNOSIS — M545 Low back pain: Secondary | ICD-10-CM | POA: Insufficient documentation

## 2015-03-21 NOTE — Therapy (Signed)
Period Weeks   Status New               Plan - 03/28/15 1055    Clinical  Impression Statement Patient is a 54 year old female with diagnosis of Chronic back pain since 03/20/2015. Patient reports she woke up with right lumbar pain with sudden onset.  Patient reports her pain is constant at level 10/10 on right lumbar and goes down right leg. Straight leg raise positive on right at 30 degrees.  Lumbar ROM deficits: flexion decreased by 50% with pain, extension decreased by 75% wit hpain, right sidebending decreased by 100% with pain and Left sidebending decreased by 75% with pain.  Patient had palapble tenderness located in right lumbar paraspinals, right quadratus and right side of T11-L2.  FOTO score is 72% limitation.  Patient sits with weight on left buttocks.  Patient stands with reduced lumbar lordosis and hips shifted to the left. Patient would benefit form physical therapy to reduce pain and increase mobility of lumbar.    Pt will benefit from skilled therapeutic intervention in order to improve on the following deficits Decreased range of motion;Difficulty walking;Pain;Increased muscle spasms;Decreased endurance;Decreased mobility;Decreased strength;Impaired flexibility   Rehab Potential Good   Clinical Impairments Affecting Rehab Potential None   PT Frequency 2x / week   PT Duration 8 weeks   PT Treatment/Interventions ADLs/Self Care Home Management;Cryotherapy;Electrical Stimulation;Moist Heat;Therapeutic exercise;Therapeutic activities;Neuromuscular re-education;Traction;Patient/family education;Manual techniques;Other (comment)  home TENS unit   PT Next Visit Plan pain relief with modalities, soft tissue work, body Armona body mechanics   Consulted and Agree with Plan of Care Patient          G-Codes - 2015/03/28 1029    Functional Assessment Tool Used FOTO score is 72% imitation   Functional Limitation Other PT primary   Other PT Primary Current Status (C9470) At least 60 percent but less than 80 percent impaired, limited or  restricted   Other PT Primary Goal Status (J6283) At least 40 percent but less than 60 percent impaired, limited or restricted       Problem List Patient Active Problem List   Diagnosis Date Noted  . Postlaminectomy syndrome, lumbar region 12/17/2014  . Right lumbar radiculitis 12/17/2014  . Lichen sclerosus 66/29/4765  . History of vulvar dysplasia 04/08/2014  . Asthma, chronic 08/21/2012  . Essential hypertension, benign 08/21/2012  . Hyperlipemia 08/21/2012  . Anxiety 08/21/2012  . Chronic low back pain 08/21/2012  . Allergic rhinitis 08/21/2012  . Recurrent UTI after sex, uses prophylactic cipro with diflucan for prevention yeast infection 08/21/2012  . Symptoms, such as flushing, sleeplessness, headache, lack of concentration, associated with the menopause 07/11/2012  . Postmenopausal HRT (hormone replacement therapy) - followed by Dr. Toney Rakes in gyn 07/11/2012    Tayva Easterday,PT 03/28/15, 11:09 AM  [facility] [address] [place], [place], [address] Phone: [phone]   Fax:  [phone]  Name: Kristin Merritt MRN: [id number] Date of Birth: 1961-01-11  Period Weeks   Status New               Plan - 03/28/15 1055    Clinical  Impression Statement Patient is a 54 year old female with diagnosis of Chronic back pain since 03/20/2015. Patient reports she woke up with right lumbar pain with sudden onset.  Patient reports her pain is constant at level 10/10 on right lumbar and goes down right leg. Straight leg raise positive on right at 30 degrees.  Lumbar ROM deficits: flexion decreased by 50% with pain, extension decreased by 75% wit hpain, right sidebending decreased by 100% with pain and Left sidebending decreased by 75% with pain.  Patient had palapble tenderness located in right lumbar paraspinals, right quadratus and right side of T11-L2.  FOTO score is 72% limitation.  Patient sits with weight on left buttocks.  Patient stands with reduced lumbar lordosis and hips shifted to the left. Patient would benefit form physical therapy to reduce pain and increase mobility of lumbar.    Pt will benefit from skilled therapeutic intervention in order to improve on the following deficits Decreased range of motion;Difficulty walking;Pain;Increased muscle spasms;Decreased endurance;Decreased mobility;Decreased strength;Impaired flexibility   Rehab Potential Good   Clinical Impairments Affecting Rehab Potential None   PT Frequency 2x / week   PT Duration 8 weeks   PT Treatment/Interventions ADLs/Self Care Home Management;Cryotherapy;Electrical Stimulation;Moist Heat;Therapeutic exercise;Therapeutic activities;Neuromuscular re-education;Traction;Patient/family education;Manual techniques;Other (comment)  home TENS unit   PT Next Visit Plan pain relief with modalities, soft tissue work, body Armona body mechanics   Consulted and Agree with Plan of Care Patient          G-Codes - 2015/03/28 1029    Functional Assessment Tool Used FOTO score is 72% imitation   Functional Limitation Other PT primary   Other PT Primary Current Status (C9470) At least 60 percent but less than 80 percent impaired, limited or  restricted   Other PT Primary Goal Status (J6283) At least 40 percent but less than 60 percent impaired, limited or restricted       Problem List Patient Active Problem List   Diagnosis Date Noted  . Postlaminectomy syndrome, lumbar region 12/17/2014  . Right lumbar radiculitis 12/17/2014  . Lichen sclerosus 66/29/4765  . History of vulvar dysplasia 04/08/2014  . Asthma, chronic 08/21/2012  . Essential hypertension, benign 08/21/2012  . Hyperlipemia 08/21/2012  . Anxiety 08/21/2012  . Chronic low back pain 08/21/2012  . Allergic rhinitis 08/21/2012  . Recurrent UTI after sex, uses prophylactic cipro with diflucan for prevention yeast infection 08/21/2012  . Symptoms, such as flushing, sleeplessness, headache, lack of concentration, associated with the menopause 07/11/2012  . Postmenopausal HRT (hormone replacement therapy) - followed by Dr. Toney Rakes in gyn 07/11/2012    Tayva Easterday,PT 03/28/15, 11:09 AM  [facility] [address] [place], [place], [address] Phone: [phone]   Fax:  [phone]  Name: Kristin Merritt MRN: [id number] Date of Birth: 1961-01-11  [facility] [address], [place], [place], [address] Phone: [phone]   Fax:  [phone]  Physical Therapy Evaluation  Patient Details  Name: Kristin Merritt MRN: [id number] Date of Birth: 1960-06-07 Referring Provider: Dr. Dorothyann Peng  Encounter Date: 03/21/2015      PT End of Session - 03/21/15 1053    Visit Number 1   Number of Visits 10  Medicare   Date for PT Re-Evaluation 05/16/15   PT Start Time 9628   PT Stop Time 1115   PT Time Calculation (min) 60 min   Activity Tolerance Patient limited by pain   Behavior During Therapy [facility] for tasks assessed/performed      Past Medical History  Diagnosis Date  . Allergy   . Anxiety   . Hypertension   . Lichen sclerosus et atrophicus   . 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 Postmenopausal HRT (hormone replacement therapy) - followed by Dr. Toney Rakes in gyn 07/11/2012  . Asthma   . Uterine cancer ([place]) 1991  . Depression   . High cholesterol   . UTI (urinary tract infection)     Past Surgical History  Procedure Laterality Date  . Eye surgery      "Lazy Muscle"  . Spine surgery      L5-s1  . Vaginal hysterectomy      There were no vitals filed for this visit.  Visit Diagnosis:  Right-sided low back pain with right-sided sciatica - Plan: PT plan of care cert/re-cert      Subjective Assessment - 03/21/15 1025    Subjective Patient reports 03/20/2015 she woke up with back pain.  Patient reports she forgot to get a home TENS unit.  I had to take pain medication last night.    Limitations Sitting;Standing;Walking   How long can you sit comfortably? 5 min   How long can you stand comfortably? 5 min   How long can you walk comfortably? 15  min   Patient Stated Goals reduce pain when it is intense   Currently in Pain? Yes   Pain Score 9    Pain Location Back   Pain Orientation Right;Lower   Pain Descriptors /  Indicators Sharp;Aching   Pain Type Acute pain   Pain Onset Yesterday   Pain Frequency Intermittent   Aggravating Factors  turning, bending, lifting, sitting, reaching, moving right leg, reaching with right arm.    Pain Relieving Factors heat pad, hot shower   Effect of Pain on Daily Activities all activities   Multiple Pain Sites No            [facility] PT Assessment - 03/21/15 0001    Assessment   Medical Diagnosis M54.9, G89.29 chronic back pain   Referring Provider Dr. Dorothyann Peng   Onset Date/Surgical Date 03/20/15   Prior Therapy yes   Precautions   Precautions Back;Other (comment)   Precaution Comments No lifting over 5 pounds, No ultrasound   Balance Screen   Has the patient fallen in the past 6 months No   Has the patient had a decrease in activity level because of a fear of falling?  No   Is the patient reluctant to leave their home because of a fear of falling?  No   Prior Function   Level of Independence Independent   Cognition   Overall Cognitive Status Within Functional Limits for tasks assessed   Observation/Other Assessments   Focus

## 2015-03-24 ENCOUNTER — Telehealth: Payer: Self-pay | Admitting: Adult Health

## 2015-03-24 ENCOUNTER — Ambulatory Visit: Payer: Medicare Other | Admitting: Rehabilitation

## 2015-03-24 DIAGNOSIS — J3089 Other allergic rhinitis: Secondary | ICD-10-CM | POA: Diagnosis not present

## 2015-03-24 DIAGNOSIS — M79605 Pain in left leg: Secondary | ICD-10-CM

## 2015-03-24 DIAGNOSIS — M545 Low back pain, unspecified: Secondary | ICD-10-CM

## 2015-03-24 DIAGNOSIS — M5441 Lumbago with sciatica, right side: Secondary | ICD-10-CM

## 2015-03-24 DIAGNOSIS — M79604 Pain in right leg: Secondary | ICD-10-CM

## 2015-03-24 NOTE — Telephone Encounter (Signed)
Patient states that Kristin Merritt sent her to physical therapy.  She just left physical therapy and wants to see if she can get [facility] for her back.   Pharmacy- [facility]

## 2015-03-24 NOTE — Therapy (Signed)
[facility] [address], [place], [place], [address] Phone: [phone]   Fax:  [phone]  Physical Therapy Treatment  Patient Details  Name: Kristin Merritt MRN: [id number] Date of Birth: 06/12/1960 Referring Provider: Dr. Dorothyann Peng  Encounter Date: 03/24/2015      PT End of Session - 03/24/15 1456    Visit Number 2   Number of Visits 10   Date for PT Re-Evaluation 05/16/15   PT Start Time 1409   PT Stop Time 1458   PT Time Calculation (min) 49 min   Activity Tolerance Patient limited by pain      Past Medical History  Diagnosis Date  . Allergy   . Anxiety   . Hypertension   . Lichen sclerosus et atrophicus   . 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 Postmenopausal HRT (hormone replacement therapy) - followed by Dr. Toney Rakes in gyn 07/11/2012  . Asthma   . Uterine cancer ([place]) 1991  . Depression   . High cholesterol   . UTI (urinary tract infection)     Past Surgical History  Procedure Laterality Date  . Eye surgery      "Lazy Muscle"  . Spine surgery      L5-s1  . Vaginal hysterectomy      There were no vitals filed for this visit.  Visit Diagnosis:  Right-sided low back pain with right-sided sciatica  Lumbar pain with radiation down both legs      Subjective Assessment - 03/24/15 1414    Subjective The pain has been radiating to the R foot now.  Starting into the L side of the back.  Has never had left back pain.     Currently in Pain? Yes   Pain Score 9    Pain Location Back   Pain Orientation Right   Pain Relieving Factors heat, TEN unit                         [facility] Adult PT Treatment/Exercise - 03/24/15 0001    Moist Heat Therapy   Number Minutes Moist Heat 15 Minutes   Moist Heat Location Lumbar Spine   Electrical Stimulation   Electrical Stimulation Location lumbar   Electrical Stimulation Action IFC   Electrical  Stimulation Parameters to tolerance   Electrical Stimulation Goals Pain   Manual Therapy   Manual Therapy Soft tissue mobilization;Joint mobilization   Manual therapy comments pain with all CPA levels   Joint Mobilization CPAS L2-5 grade II 2x20 each   Soft tissue mobilization STM R lumbar paraspinals/QL and into R glutes     L sidelying mailtand rotations grade II x30"             PT Short Term Goals - 03/21/15 1105    PT SHORT TERM GOAL #1   Title understand correct body mechanics to pervent injury to spine   Time 4   Period Weeks   Status New   PT SHORT TERM GOAL #2   Title move in bed with >/= 25% great ease due to increased flexibility   Time 4   Period Weeks   Status New   PT SHORT TERM GOAL #3   Title pain with sitting with equal buttocks decreased >/= 25%   Time 4   Period Weeks   Status New   PT SHORT TERM GOAL #4   Title pain with standing decreased >/=  25%   Time 4   Period Weeks   Status New           PT Long Term Goals - 03/21/15 1107    PT LONG TERM GOAL #1   Title understand how to pick light items up with correct body mechanics to decrease strain on lumbar   Time 8   Period Weeks   Status New   PT LONG TERM GOAL #2   Title move in bed with >/= 50% greater ease    Time 8   Period Weeks   Status New   PT LONG TERM GOAL #3   Title understand different ways to manage her pain with exercise program and home TENS unit   Time 8   Period Weeks   Status New   PT LONG TERM GOAL #4   Title pain during daily activities are minimal to moderate level   Time 8   Period Weeks   Status New   PT LONG TERM GOAL #5   Title pain with sitting decreased >/= 50%   Time 8   Period Weeks   Status New               Plan - 03/24/15 1456    Clinical Impression Statement Pt unable to lie supine, or sidelying comfortably today and can only lie prone with head of bed elevated. Reports no change in pain after treatment and is going to visit the MD  about getting flexeril.     PT Next Visit Plan pain relief with modalities, soft tissue work, Biomedical scientist        Problem List Patient Active Problem List   Diagnosis Date Noted  . Postlaminectomy syndrome, lumbar region 12/17/2014  . Right lumbar radiculitis 12/17/2014  . Lichen sclerosus 00/34/9179  . History of vulvar dysplasia 04/08/2014  . Asthma, chronic 08/21/2012  . Essential hypertension, benign 08/21/2012  . Hyperlipemia 08/21/2012  . Anxiety 08/21/2012  . Chronic low back pain 08/21/2012  . Allergic rhinitis 08/21/2012  . Recurrent UTI after sex, uses prophylactic cipro with diflucan for prevention yeast infection 08/21/2012  . Symptoms, such as flushing, sleeplessness, headache, lack of concentration, associated with the menopause 07/11/2012  . Postmenopausal HRT (hormone replacement therapy) - followed by Dr. Toney Rakes in gyn 07/11/2012    [name], DPT, [place] 03/24/2015, 3:20 PM  [facility] [address], [place], [place], [address] Phone: [phone]   Fax:  [phone]  Name: Kristin Merritt MRN: [id number] Date of Birth: 05/31/1960

## 2015-03-25 MED ORDER — CYCLOBENZAPRINE HCL 10 MG PO TABS: 10.0000 mg | ORAL_TABLET | Freq: Three times a day (TID) | ORAL | Status: DC | PRN

## 2015-03-25 NOTE — Telephone Encounter (Signed)
Rx sent to pharmacy.  Called and spoke with pt and pt is aware.  

## 2015-03-25 NOTE — Telephone Encounter (Signed)
She can have one month of flexeril, no refills.

## 2015-03-25 NOTE — Telephone Encounter (Signed)
Has she tried 600mg  Ibuprofen and a heating pad first?

## 2015-03-25 NOTE — Telephone Encounter (Signed)
Pt states she has tried and abovr 800 ml of ibuprofena heating and and tens unit,  Flexeril is the only thing that helps and that is what she has.   Does pt need an appt?  [facility]

## 2015-03-26 ENCOUNTER — Ambulatory Visit: Payer: Medicare Other | Attending: Adult Health

## 2015-03-26 DIAGNOSIS — M5441 Lumbago with sciatica, right side: Secondary | ICD-10-CM | POA: Insufficient documentation

## 2015-03-26 DIAGNOSIS — M545 Low back pain, unspecified: Secondary | ICD-10-CM

## 2015-03-26 DIAGNOSIS — M79605 Pain in left leg: Secondary | ICD-10-CM

## 2015-03-26 NOTE — Therapy (Signed)
[facility] [address], [place], [place], [address] Phone: [phone]   Fax:  [phone]  Physical Therapy Treatment  Patient Details  Name: Kristin Merritt MRN: [id number] Date of Birth: 08-Sep-1960 Referring Provider: Dr. Dorothyann Peng  Encounter Date: 03/26/2015      PT End of Session - 03/26/15 1434    Visit Number 3   Number of Visits 10   Date for PT Re-Evaluation 05/16/15   PT Start Time 1404   PT Stop Time 1445   PT Time Calculation (min) 41 min   Activity Tolerance Patient limited by pain   Behavior During Therapy [facility] for tasks assessed/performed      Past Medical History  Diagnosis Date  . Allergy   . Anxiety   . Hypertension   . Lichen sclerosus et atrophicus   . 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 Postmenopausal HRT (hormone replacement therapy) - followed by Dr. Toney Rakes in gyn 07/11/2012  . Asthma   . Uterine cancer ([place]) 1991  . Depression   . High cholesterol   . UTI (urinary tract infection)     Past Surgical History  Procedure Laterality Date  . Eye surgery      "Lazy Muscle"  . Spine surgery      L5-s1  . Vaginal hysterectomy      There were no vitals filed for this visit.  Visit Diagnosis:  Right-sided low back pain with right-sided sciatica  Lumbar pain with radiation down both legs      Subjective Assessment - 03/26/15 1405    Subjective Pt just got prescription for flexeril today.  Pt feeling better today than last visit.     Currently in Pain? Yes   Pain Score 6    Pain Location Back   Pain Orientation Right   Pain Descriptors / Indicators Sharp;Aching   Pain Type Acute pain   Pain Onset More than a month ago   Pain Frequency Intermittent   Aggravating Factors  turning, bending, lifting, sitting   Pain Relieving Factors sittin on Lt buttock                         [facility] Adult PT Treatment/Exercise -  03/26/15 0001    Lumbar Exercises: Stretches   Single Knee to Chest Stretch 3 reps;20 seconds   Lower Trunk Rotation 3 reps;20 seconds   Modalities   Modalities Traction   Moist Heat Therapy   Number Minutes Moist Heat 15 Minutes   Moist Heat Location Lumbar Spine   Electrical Stimulation   Electrical Stimulation Location lumbar   Electrical Stimulation Action IFC'   Electrical Stimulation Parameters 15 minutes   Electrical Stimulation Goals Pain   Traction   Type of Traction Lumbar   Min (lbs) 50   Max (lbs) 70   Hold Time 60   Rest Time 10   Time 5                  PT Short Term Goals - 03/26/15 1410    PT SHORT TERM GOAL #1   Title understand correct body mechanics to pervent injury to spine   Time 4   Period Weeks   Status On-going  not educated yet   PT SHORT TERM GOAL #2   Title move in bed with >/= 25% great ease due to increased flexibility   Time 4  Period Weeks   Status On-going  40% improvement   PT SHORT TERM GOAL #3   Title pain with sitting with equal buttocks decreased >/= 25%   Time 4   Period Weeks   Status On-going   PT SHORT TERM GOAL #4   Title pain with standing decreased >/= 25%   Time 4   Period Weeks   Status On-going           PT Long Term Goals - 03/21/15 1107    PT LONG TERM GOAL #1   Title understand how to pick light items up with correct body mechanics to decrease strain on lumbar   Time 8   Period Weeks   Status New   PT LONG TERM GOAL #2   Title move in bed with >/= 50% greater ease    Time 8   Period Weeks   Status New   PT LONG TERM GOAL #3   Title understand different ways to manage her pain with exercise program and home TENS unit   Time 8   Period Weeks   Status New   PT LONG TERM GOAL #4   Title pain during daily activities are minimal to moderate level   Time 8   Period Weeks   Status New   PT LONG TERM GOAL #5   Title pain with sitting decreased >/= 50%   Time 8   Period Weeks   Status New                Plan - 03/26/15 1413    Clinical Impression Statement Pt reports 40% improvement in bed mobility and no change in pain with standing. Pt reports 6/10 Rt LE pain today and sits with Lt>Rt weightbearing.  Pt able to tolerate supine today. PT tried traction and pt reported onset of symptoms in the Lt LE.  Traction was stopped at 5 minutes due to pain.    Pt with limited mobility due to pain and will benefit from skilled PT for traction, flexibility and modalities for pain.      Pt will benefit from skilled therapeutic intervention in order to improve on the following deficits Decreased range of motion;Difficulty walking;Pain;Increased muscle spasms;Decreased endurance;Decreased mobility;Decreased strength;Impaired flexibility   Rehab Potential Good   PT Frequency 2x / week   PT Duration 8 weeks   PT Treatment/Interventions ADLs/Self Care Home Management;Cryotherapy;Electrical Stimulation;Moist Heat;Therapeutic exercise;Therapeutic activities;Neuromuscular re-education;Traction;Patient/family education;Manual techniques;Other (comment)   PT Next Visit Plan pain relief with modalities, soft tissue work, Biomedical scientist.  See how pt responds to traction.   Consulted and Agree with Plan of Care Patient        Problem List Patient Active Problem List   Diagnosis Date Noted  . Postlaminectomy syndrome, lumbar region 12/17/2014  . Right lumbar radiculitis 12/17/2014  . Lichen sclerosus 54/65/0354  . History of vulvar dysplasia 04/08/2014  . Asthma, chronic 08/21/2012  . Essential hypertension, benign 08/21/2012  . Hyperlipemia 08/21/2012  . Anxiety 08/21/2012  . Chronic low back pain 08/21/2012  . Allergic rhinitis 08/21/2012  . Recurrent UTI after sex, uses prophylactic cipro with diflucan for prevention yeast infection 08/21/2012  . Symptoms, such as flushing, sleeplessness, headache, lack of concentration, associated with the menopause 07/11/2012  . Postmenopausal HRT  (hormone replacement therapy) - followed by Dr. Toney Rakes in gyn 07/11/2012    TAKACS,KELLY, PT 03/26/2015, 2:35 PM  [facility] [address], [place], [place], [address] Phone: [phone]  Fax:  [phone]  Name: Kristin Merritt MRN: [id number] Date of Birth: 11/17/1960

## 2015-03-31 ENCOUNTER — Ambulatory Visit: Payer: Medicare Other

## 2015-03-31 DIAGNOSIS — M5441 Lumbago with sciatica, right side: Secondary | ICD-10-CM

## 2015-03-31 DIAGNOSIS — J3089 Other allergic rhinitis: Secondary | ICD-10-CM | POA: Diagnosis not present

## 2015-03-31 DIAGNOSIS — M545 Low back pain: Secondary | ICD-10-CM | POA: Diagnosis not present

## 2015-03-31 NOTE — Patient Instructions (Signed)
   Lifting Principles  Maintain proper posture and head alignment. Slide object as close as possible before lifting. Move obstacles out of the way. Test before lifting; ask for help if too heavy. Tighten stomach muscles without holding breath. Use smooth movements; do not jerk. Use legs to do the work, and pivot with feet. Distribute the work load symmetrically and close to the center of trunk. Push instead of pull whenever possible.   Squat down and hold basket close to stand. Use leg muscles to do the work.    Avoid twisting or bending back. Pivot around using foot movements, and bend at knees if needed when reaching for articles.        Getting Into / Out of Bed   Lower self to lie down on one side by raising legs and lowering head at the same time. Use arms to assist moving without twisting. Bend both knees to roll onto back if desired. To sit up, start from lying on side, and use same move-ments in reverse. Keep trunk aligned with legs.    Shift weight from front foot to back foot as item is lifted off shelf.    When leaning forward to pick object up from floor, extend one leg out behind. Keep back straight. Hold onto a sturdy support with other hand.      Sit upright, head facing forward. Try using a roll to support lower back. Keep shoulders relaxed, and avoid rounded back. Keep hips level with knees. Avoid crossing legs for long periods.     [facility] [address], [address] [place],  [address] Phone # [phone] Fax [phone]  

## 2015-03-31 NOTE — Therapy (Signed)
[facility] [address] [place], [place], [address] Phone: [phone]   Fax:  [phone]  Physical Therapy Treatment  Patient Details  Name: Kristin Merritt MRN: [id number] Date of Birth: 25-Mar-1961 Referring Provider: Dr. Shirline Frees  Encounter Date: 03/31/2015      PT End of Session - 03/31/15 1431    Visit Number 4   Number of Visits 10   Date for PT Re-Evaluation 05/16/15   PT Start Time 1404   PT Stop Time 1446   PT Time Calculation (min) 42 min   Activity Tolerance Patient tolerated treatment well   Behavior During Therapy [facility] for tasks assessed/performed      Past Medical History  Diagnosis Date  . Allergy   . Anxiety   . Hypertension   . Lichen sclerosus et atrophicus   . 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 Postmenopausal HRT (hormone replacement therapy) - followed by Dr. Lily Peer in gyn 07/11/2012  . Asthma   . Uterine cancer ([facility]) 1991  . Depression   . High cholesterol   . UTI (urinary tract infection)     Past Surgical History  Procedure Laterality Date  . Eye surgery      "Lazy Muscle"  . Spine surgery      L5-s1  . Vaginal hysterectomy      There were no vitals filed for this visit.  Visit Diagnosis:  Right-sided low back pain with right-sided sciatica      Subjective Assessment - 03/31/15 1407    Subjective Pt is taking flexeril now and feeling better.  Pt is not doing exercises because she is afraid to increased the pain.  Went to work today.  Pt requests no exercise today-she is afraid to do it.   Currently in Pain? Yes   Pain Score 5    Pain Location Back   Pain Orientation Right   Pain Descriptors / Indicators Sharp;Aching   Pain Type Acute pain   Pain Onset More than a month ago   Pain Frequency Intermittent   Aggravating Factors  bending over, lifting, coming up after bending over, getting out of bed   Pain Relieving Factors  heat, TENs                         [facility] Adult PT Treatment/Exercise - 03/31/15 0001    Moist Heat Therapy   Number Minutes Moist Heat 15 Minutes   Moist Heat Location Lumbar Spine   Electrical Stimulation   Electrical Stimulation Location lumbar   Electrical Stimulation Action IFC   Electrical Stimulation Parameters 15 minutes   Electrical Stimulation Goals Pain                PT Education - 03/31/15 1421    Education provided Yes   Education Details body mechanics education   Person(s) Educated Patient   Methods Explanation;Demonstration;Handout   Comprehension Verbalized understanding          PT Short Term Goals - 03/31/15 1428    PT SHORT TERM GOAL #1   Title understand correct body mechanics to pervent injury to spine   Status Achieved   PT SHORT TERM GOAL #2   Title move in bed with >/= 25% great ease due to increased flexibility   Status Achieved   PT SHORT TERM GOAL #3   Title pain with sitting with equal buttocks decreased >/= 25%  Status Achieved   PT SHORT TERM GOAL #4   Title pain with standing decreased >/= 25%   Time 4   Period Weeks   Status On-going           PT Long Term Goals - 03/21/15 1107    PT LONG TERM GOAL #1   Title understand how to pick light items up with correct body mechanics to decrease strain on lumbar   Time 8   Period Weeks   Status New   PT LONG TERM GOAL #2   Title move in bed with >/= 50% greater ease    Time 8   Period Weeks   Status New   PT LONG TERM GOAL #3   Title understand different ways to manage her pain with exercise program and home TENS unit   Time 8   Period Weeks   Status New   PT LONG TERM GOAL #4   Title pain during daily activities are minimal to moderate level   Time 8   Period Weeks   Status New   PT LONG TERM GOAL #5   Title pain with sitting decreased >/= 50%   Time 8   Period Weeks   Status New               Plan - 03/31/15 1428    Clinical  Impression Statement Pt is feeling much better today due to taking flexeril.  Pt is able to sit with Rt=Lt weightbearing and perform bed mobility without difficulty today. Pt requested no exercise today due to fear of flaring-up her pain.  Pt has received body mechanis education and verbalizes understanding.  Pt will benefit from skilled PT for advancement of exercise for flexibility and core strength and modalities PRN.   Pt will benefit from skilled therapeutic intervention in order to improve on the following deficits Decreased range of motion;Difficulty walking;Pain;Increased muscle spasms;Decreased endurance;Decreased mobility;Decreased strength;Impaired flexibility   Rehab Potential Good   PT Frequency 2x / week   PT Duration 8 weeks   PT Treatment/Interventions ADLs/Self Care Home Management;Cryotherapy;Electrical Stimulation;Moist Heat;Therapeutic exercise;Therapeutic activities;Neuromuscular re-education;Traction;Patient/family education;Manual techniques;Other (comment)   PT Next Visit Plan Gentle flexibility and core strength if pt agrees, modalities PRN.   Consulted and Agree with Plan of Care Patient        Problem List Patient Active Problem List   Diagnosis Date Noted  . Postlaminectomy syndrome, lumbar region 12/17/2014  . Right lumbar radiculitis 12/17/2014  . Lichen sclerosus 04/08/2014  . History of vulvar dysplasia 04/08/2014  . Asthma, chronic 08/21/2012  . Essential hypertension, benign 08/21/2012  . Hyperlipemia 08/21/2012  . Anxiety 08/21/2012  . Chronic low back pain 08/21/2012  . Allergic rhinitis 08/21/2012  . Recurrent UTI after sex, uses prophylactic cipro with diflucan for prevention yeast infection 08/21/2012  . Symptoms, such as flushing, sleeplessness, headache, lack of concentration, associated with the menopause 07/11/2012  . Postmenopausal HRT (hormone replacement therapy) - followed by Dr. Lily Peer in gyn 07/11/2012    Vienna Folden, PT 03/31/2015,  2:32 PM  [facility] [address], [address] [place], [place], [address] Phone: [phone]   Fax:  [phone]  Name: Kristin Merritt MRN: [id number] Date of Birth: 1960/11/30

## 2015-04-01 DIAGNOSIS — L602 Onychogryphosis: Secondary | ICD-10-CM | POA: Diagnosis not present

## 2015-04-01 DIAGNOSIS — B351 Tinea unguium: Secondary | ICD-10-CM | POA: Diagnosis not present

## 2015-04-01 DIAGNOSIS — M722 Plantar fascial fibromatosis: Secondary | ICD-10-CM | POA: Diagnosis not present

## 2015-04-02 ENCOUNTER — Ambulatory Visit: Payer: Medicare Other

## 2015-04-02 DIAGNOSIS — M5441 Lumbago with sciatica, right side: Secondary | ICD-10-CM

## 2015-04-02 DIAGNOSIS — M545 Low back pain, unspecified: Secondary | ICD-10-CM

## 2015-04-02 DIAGNOSIS — M79605 Pain in left leg: Secondary | ICD-10-CM

## 2015-04-02 DIAGNOSIS — M79604 Pain in right leg: Secondary | ICD-10-CM

## 2015-04-02 NOTE — Therapy (Signed)
[facility] [address] [place], [place], [address] Phone: [phone]   Fax:  [phone]  Physical Therapy Treatment  Patient Details  Name: Kristin Merritt MRN: [id number] Date of Birth: 1960-12-05 Referring Provider: Dr. Dorothyann Peng  Encounter Date: 04/02/2015      PT End of Session - 04/02/15 1425    Visit Number 5   Number of Visits 10   Date for PT Re-Evaluation 05/16/15   PT Start Time 1400   PT Stop Time 1445   PT Time Calculation (min) 45 min   Activity Tolerance Patient tolerated treatment well   Behavior During Therapy [facility] for tasks assessed/performed      Past Medical History  Diagnosis Date  . Allergy   . Anxiety   . Hypertension   . Lichen sclerosus et atrophicus   . 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 Postmenopausal HRT (hormone replacement therapy) - followed by Dr. Toney Rakes in gyn 07/11/2012  . Asthma   . Uterine cancer ([place]) 1991  . Depression   . High cholesterol   . UTI (urinary tract infection)     Past Surgical History  Procedure Laterality Date  . Eye surgery      "Lazy Muscle"  . Spine surgery      L5-s1  . Vaginal hysterectomy      There were no vitals filed for this visit.  Visit Diagnosis:  Right-sided low back pain with right-sided sciatica  Lumbar pain with radiation down both legs      Subjective Assessment - 04/02/15 1405    Subjective Still feeling good.  Tired from getting up early.     Currently in Pain? Yes   Pain Score 2    Pain Location Back   Pain Orientation Right   Pain Descriptors / Indicators Sharp;Aching                         [facility] Adult PT Treatment/Exercise - 04/02/15 0001    Lumbar Exercises: Stretches   Active Hamstring Stretch 3 reps;20 seconds  supine with strap and seated   Single Knee to Chest Stretch 3 reps;20 seconds   Lower Trunk Rotation 3 reps;20 seconds   Lumbar  Exercises: Aerobic   UBE (Upper Arm Bike) seated on green ball Level 1x 6 minutes   Lumbar Exercises: Supine   Straight Leg Raise 20 reps  abdominal bracing   Moist Heat Therapy   Number Minutes Moist Heat 15 Minutes   Moist Heat Location Lumbar Spine   Electrical Stimulation   Electrical Stimulation Location lumbar   Electrical Stimulation Action IFC   Electrical Stimulation Parameters 15 minutes   Electrical Stimulation Goals Pain                  PT Short Term Goals - 03/31/15 1428    PT SHORT TERM GOAL #1   Title understand correct body mechanics to pervent injury to spine   Status Achieved   PT SHORT TERM GOAL #2   Title move in bed with >/= 25% great ease due to increased flexibility   Status Achieved   PT SHORT TERM GOAL #3   Title pain with sitting with equal buttocks decreased >/= 25%   Status Achieved   PT SHORT TERM GOAL #4   Title pain with standing decreased >/= 25%   Time 4   Period Weeks   Status  On-going           PT Long Term Goals - 03/21/15 1107    PT LONG TERM GOAL #1   Title understand how to pick light items up with correct body mechanics to decrease strain on lumbar   Time 8   Period Weeks   Status New   PT LONG TERM GOAL #2   Title move in bed with >/= 50% greater ease    Time 8   Period Weeks   Status New   PT LONG TERM GOAL #3   Title understand different ways to manage her pain with exercise program and home TENS unit   Time 8   Period Weeks   Status New   PT LONG TERM GOAL #4   Title pain during daily activities are minimal to moderate level   Time 8   Period Weeks   Status New   PT LONG TERM GOAL #5   Title pain with sitting decreased >/= 50%   Time 8   Period Weeks   Status New               Plan - 04/02/15 1409    Clinical Impression Statement Pt was able to ride recumbent bike today at home for 20 minutes.  Pt was able to return to stretching exercises today without difficulty.  Pt with improved bed  mobility without difficulty today.  Pt with 1-2/10 pain today and able to sit with neutral posture.  Pt will benefit from skilled PT for advancement of exercise for flexibility and core strength progression.     Pt will benefit from skilled therapeutic intervention in order to improve on the following deficits Decreased range of motion;Difficulty walking;Pain;Increased muscle spasms;Decreased endurance;Decreased mobility;Decreased strength;Impaired flexibility   Rehab Potential Good   PT Frequency 2x / week   PT Duration 8 weeks   PT Treatment/Interventions ADLs/Self Care Home Management;Cryotherapy;Electrical Stimulation;Moist Heat;Therapeutic exercise;Therapeutic activities;Neuromuscular re-education;Traction;Patient/family education;Manual techniques;Other (comment)   PT Next Visit Plan Gentle flexibility and strength, modalities PRN   Consulted and Agree with Plan of Care Patient        Problem List Patient Active Problem List   Diagnosis Date Noted  . Postlaminectomy syndrome, lumbar region 12/17/2014  . Right lumbar radiculitis 12/17/2014  . Lichen sclerosus 66/29/4765  . History of vulvar dysplasia 04/08/2014  . Asthma, chronic 08/21/2012  . Essential hypertension, benign 08/21/2012  . Hyperlipemia 08/21/2012  . Anxiety 08/21/2012  . Chronic low back pain 08/21/2012  . Allergic rhinitis 08/21/2012  . Recurrent UTI after sex, uses prophylactic cipro with diflucan for prevention yeast infection 08/21/2012  . Symptoms, such as flushing, sleeplessness, headache, lack of concentration, associated with the menopause 07/11/2012  . Postmenopausal HRT (hormone replacement therapy) - followed by Dr. Toney Rakes in gyn 07/11/2012    TAKACS,KELLY, PT 04/02/2015, 2:28 PM  [facility] [address] [place], [place], [address] Phone: [phone]   Fax:  [phone]  Name: Kristin Merritt MRN: [id number] Date of Birth:  02/17/1961

## 2015-04-08 ENCOUNTER — Other Ambulatory Visit: Payer: Self-pay | Admitting: Physical Medicine & Rehabilitation

## 2015-04-10 ENCOUNTER — Encounter: Payer: Self-pay | Admitting: Physical Therapy

## 2015-04-10 ENCOUNTER — Ambulatory Visit: Payer: Medicare Other | Admitting: Physical Therapy

## 2015-04-10 DIAGNOSIS — M79605 Pain in left leg: Secondary | ICD-10-CM

## 2015-04-10 DIAGNOSIS — M5441 Lumbago with sciatica, right side: Secondary | ICD-10-CM

## 2015-04-10 DIAGNOSIS — J3089 Other allergic rhinitis: Secondary | ICD-10-CM | POA: Diagnosis not present

## 2015-04-10 DIAGNOSIS — M545 Low back pain, unspecified: Secondary | ICD-10-CM

## 2015-04-10 DIAGNOSIS — M79604 Pain in right leg: Secondary | ICD-10-CM

## 2015-04-10 NOTE — Patient Instructions (Signed)
Lifting Principles  .Maintain proper posture and head alignment. .Slide object as close as possible before lifting. .Move obstacles out of the way. .Test before lifting; ask for help if too heavy. .Tighten stomach muscles without holding breath. .Use smooth movements; do not jerk. .Use legs to do the work, and pivot with feet. .Distribute the work load symmetrically and close to the center of trunk. .Push instead of pull whenever possible.  Copyright  VHI. All rights reserved.  Reducing Load    Move heavy items one at a time, or move portions of the contents.   Copyright  VHI. All rights reserved.  Deep Squat    Squat and lift with both arms held against upper trunk. Tighten stomach muscles without holding breath. Use smooth movements to avoid jerking.  Copyright  VHI. All rights reserved.  Cart    When reaching into cart with one arm, lift opposite leg to keep back straight.   Copyright  VHI. All rights reserved.  Ask For Help    Ask for help and delegate to others when possible. Coordinate your movements when lifting together, and maintain the low back curve.   Copyright  VHI. All rights reserved.  Housework - Sweeping    Use long-handled equipment to avoid stooping.   Copyright  VHI. All rights reserved.  Housework - Tub Scrub    Kneel down and use long-handled sponge or brush to reach.   Copyright  VHI. All rights reserved.  Housework - Wiping    Position yourself as close as possible to reach work surface. Avoid straining your back.   Copyright  VHI. All rights reserved.  [facility] [address] [place], [place] [address] Phone # [phone] Fax [phone]

## 2015-04-10 NOTE — Therapy (Addendum)
[facility] [address], [address] [place], [place], [address] Phone: [phone]   Fax:  [phone]  Physical Therapy Treatment  Patient Details  Name: Kristin Merritt MRN: [id number] Date of Birth: [birth year] Referring Provider: Dr. Shirline Frees  Encounter Date: 04/10/2015      PT End of Session - 04/10/15 1410    Visit Number 6   Number of Visits 10  Medicare   Date for PT Re-Evaluation 05/16/15   PT Start Time 1400   PT Stop Time 1455   PT Time Calculation (min) 55 min   Activity Tolerance Patient tolerated treatment well   Behavior During Therapy [facility] for tasks assessed/performed      Past Medical History  Diagnosis Date  . Allergy   . Anxiety   . Hypertension   . Lichen sclerosus et atrophicus   . 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 Postmenopausal HRT (hormone replacement therapy) - followed by Dr. Lily Peer in gyn 07/11/2012  . Asthma   . Uterine cancer (HCC) 1991  . Depression   . High cholesterol   . UTI (urinary tract infection)     Past Surgical History  Procedure Laterality Date  . Eye surgery      "Lazy Muscle"  . Spine surgery      L5-s1  . Vaginal hysterectomy      There were no vitals filed for this visit.  Visit Diagnosis:  Right-sided low back pain with right-sided sciatica  Lumbar pain with radiation down both legs      Subjective Assessment - 04/10/15 1411    Subjective I had to miss last visit due to tired from work.    How long can you sit comfortably? 15 min   How long can you stand comfortably? 10 min   Diagnostic tests 15 min   Patient Stated Goals reduce pain when it is intense   Currently in Pain? Yes   Pain Score 5    Pain Location Back   Pain Orientation Right;Left   Pain Descriptors / Indicators Aching   Pain Type Acute pain   Pain Radiating Towards toward both feet and sometimes right worse than left   Pain Onset More than a  month ago   Pain Frequency Intermittent   Aggravating Factors  bending over, lifting, coming up after bending over, getting out of bed   Pain Relieving Factors heat, TENS   Effect of Pain on Daily Activities all activities   Multiple Pain Sites No                         [facility] Adult PT Treatment/Exercise - 04/10/15 0001    Therapeutic Activites    Therapeutic Activities Lifting;ADL's   ADL's cleaning tub, cleaning table, sweeping   Lifting lifting principles, no twisting, lift from floor, get stuff out of cart   Lumbar Exercises: Stretches   Active Hamstring Stretch 3 reps;20 seconds  supine with strap and seated   Single Knee to Chest Stretch 3 reps;20 seconds   Lower Trunk Rotation 3 reps;20 seconds   Lumbar Exercises: Supine   Clam 20 reps  with abdominal bracing   Clam Limitations sidely   Straight Leg Raise 20 reps  abdominal bracing   Modalities   Modalities Electrical Stimulation;Moist Heat   Moist Heat Therapy   Number Minutes Moist Heat 15 Minutes   Moist Heat Location Lumbar Spine  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 to patient tolerance   Electrical Stimulation Goals Pain                PT Education - 04/10/15 1429    Education provided Yes   Education Details body mechanics   Person(s) Educated Patient   Methods Explanation;Demonstration;Handout   Comprehension Returned demonstration;Verbalized understanding          PT Short Term Goals - 04/10/15 1417    PT SHORT TERM GOAL #4   Title pain with standing decreased >/= 25%   Time 4   Period Weeks   Status Achieved           PT Long Term Goals - 04/10/15 1417    PT LONG TERM GOAL #1   Title understand how to pick light items up with correct body mechanics to decrease strain on lumbar   Time 8   Period Weeks   PT LONG TERM GOAL #2   Title move in bed with >/=  50% greater ease    Time 8   Period Weeks   Status Achieved  60% better   PT LONG TERM GOAL #3   Title understand different ways to manage her pain with exercise program and home TENS unit   Time 8   Period Weeks   Status Achieved   PT LONG TERM GOAL #4   Title pain during daily activities are minimal to moderate level   Time 8   Period Weeks   Status On-going  moderate pain   PT LONG TERM GOAL #5   Title pain with sitting decreased >/= 50%   Time 8   Period Weeks   Status Achieved  50% better               Plan - 04/10/15 1419    Clinical Impression Statement Patient has met her STG #1, LTG # 2,3 5.  Patient reports 60% easier to move in bed.  Patient reports pain with siting decreased by 50%. Patient is able to move in therapy with increased fluid movement. Patient will benefit from phsyical therapy to improvemobility and reduce pain.     Pt will benefit from skilled therapeutic intervention in order to improve on the following deficits Decreased range of motion;Difficulty walking;Pain;Increased muscle spasms;Decreased endurance;Decreased mobility;Decreased strength;Impaired flexibility   Rehab Potential Good   Clinical Impairments Affecting Rehab Potential None   PT Frequency 2x / week   PT Treatment/Interventions ADLs/Self Care Home Management;Cryotherapy;Electrical Stimulation;Moist Heat;Therapeutic exercise;Therapeutic activities;Neuromuscular re-education;Traction;Patient/family education;Manual techniques;Other (comment)   PT Next Visit Plan Gentle flexibility and strength, modalities PRN   PT Home Exercise Plan progress as needed.   Consulted and Agree with Plan of Care Patient     G-codes: Other PT primary CK D/C and current status   Problem List Patient Active Problem List   Diagnosis Date Noted  . Postlaminectomy syndrome, lumbar region 12/17/2014  . Right lumbar radiculitis 12/17/2014  . Lichen sclerosus 04/08/2014  . History of vulvar dysplasia  04/08/2014  . Asthma, chronic 08/21/2012  . Essential hypertension, benign 08/21/2012  . Hyperlipemia 08/21/2012  . Anxiety 08/21/2012  . Chronic low back pain 08/21/2012  . Allergic rhinitis 08/21/2012  . Recurrent UTI after sex, uses prophylactic cipro with diflucan for prevention yeast infection 08/21/2012  . Symptoms, such as flushing, sleeplessness, headache, lack of concentration, associated with the menopause 07/11/2012  . Postmenopausal HRT (  hormone replacement therapy) - followed by Dr. Lily Peer in gyn 07/11/2012    Kristin Merritt,PT 04/10/2015, 2:41 PM PHYSICAL THERAPY DISCHARGE SUMMARY  Visits from Start of Care: 6  Current functional level related to goals / functional outcomes: See above for current status.  Pt cancelled all remaining appts as she is feeling much better and wanted to be discharged to [facility]   Remaining deficits: Pt denies any significant limitation at this time.     Education / Equipment: [facility]P, Estate manager/land agent education Plan: Patient agrees to discharge.  Patient goals were partially met. Patient is being discharged due to the patient's request.  ?????   Kristin Merritt, PT 04/23/2015 12:36 PM  [facility] [address], [address] [place], [place], [address] Phone: [phone]   Fax:  [phone]  Name: Kristin Merritt MRN: [id number] Date of Birth: 1960-12-01

## 2015-04-14 ENCOUNTER — Ambulatory Visit: Payer: Medicare Other

## 2015-04-21 ENCOUNTER — Ambulatory Visit: Payer: Medicare Other

## 2015-04-21 ENCOUNTER — Telehealth: Payer: Self-pay

## 2015-04-21 NOTE — Telephone Encounter (Signed)
PT called pt due to no-show appointment today.  She was at work late and forgot appt.  She confirmed appt 04/23/15.

## 2015-04-25 DIAGNOSIS — J3089 Other allergic rhinitis: Secondary | ICD-10-CM | POA: Diagnosis not present

## 2015-05-02 DIAGNOSIS — J3089 Other allergic rhinitis: Secondary | ICD-10-CM | POA: Diagnosis not present

## 2015-05-09 DIAGNOSIS — J3089 Other allergic rhinitis: Secondary | ICD-10-CM | POA: Diagnosis not present

## 2015-05-10 ENCOUNTER — Other Ambulatory Visit: Payer: Self-pay | Admitting: Physical Medicine & Rehabilitation

## 2015-05-12 ENCOUNTER — Ambulatory Visit (INDEPENDENT_AMBULATORY_CARE_PROVIDER_SITE_OTHER): Payer: Medicare Other | Admitting: Adult Health

## 2015-05-12 ENCOUNTER — Encounter: Payer: Self-pay | Admitting: Adult Health

## 2015-05-12 VITALS — BP 120/80 | Temp 98.2°F | Ht 66.0 in | Wt 211.3 lb

## 2015-05-12 DIAGNOSIS — R42 Dizziness and giddiness: Secondary | ICD-10-CM

## 2015-05-12 DIAGNOSIS — J3089 Other allergic rhinitis: Secondary | ICD-10-CM | POA: Diagnosis not present

## 2015-05-12 DIAGNOSIS — R300 Dysuria: Secondary | ICD-10-CM | POA: Diagnosis not present

## 2015-05-12 LAB — POCT URINALYSIS DIPSTICK
Bilirubin, UA: NEGATIVE
Blood, UA: NEGATIVE
Glucose, UA: NEGATIVE
KETONES UA: NEGATIVE
LEUKOCYTES UA: NEGATIVE
NITRITE UA: NEGATIVE
PH UA: 7
PROTEIN UA: NEGATIVE
Spec Grav, UA: 1.02
UROBILINOGEN UA: 1

## 2015-05-12 NOTE — Progress Notes (Signed)
Subjective:    Patient ID: Kristin Merritt, female    DOB: August 18, 1960, 54 y.o.   MRN: [id number]  HPI 54 year old female who presents to the office today for dizziness. She is experiencing dizziness at some point every day for the last two weeks. More specific, she feels light headed. Denies any feeling of the room spinning.   She thought it was her blood pressure, but she monitored her BP and it was in the 120-130's. She endorses maxillary sinus pressure. She does have clear rhinorrhea. She takes multiple allergy medications daily.   She endorses not drinking enough water throughout the day.   Denies any fevers, cough, headaches, or syncopal episodes  Also complaining of a strong urine odor. She has been taking cipro she had left over from a previous UTI  Review of Systems  Constitutional: Negative.   HENT: Positive for rhinorrhea and sinus pressure. Negative for postnasal drip, sneezing, sore throat and tinnitus.   Respiratory: Negative.   Neurological: Positive for light-headedness. Negative for dizziness, weakness and headaches.  Hematological: Negative.   Psychiatric/Behavioral: Negative.   All other systems reviewed and are negative.  Past Medical History  Diagnosis Date  . Allergy   . Anxiety   . Hypertension   . Lichen sclerosus et atrophicus   . 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 Postmenopausal HRT (hormone replacement therapy) - followed by Dr. Toney Rakes in gyn 07/11/2012  . Asthma   . Uterine cancer ([place]) 1991  . Depression   . High cholesterol   . UTI (urinary tract infection)     Social History   Social History  . Marital Status: Widowed    Spouse Name: N/A  . Number of Children: N/A  . Years of Education: N/A   Occupational History  . Not on file.   Social History Main Topics  . Smoking status: Former Smoker -- 1.00 packs/day for 20 years    Types: Cigarettes    Quit date: 06/05/2012  . Smokeless tobacco: Never  Used  . Alcohol Use: No  . Drug Use: No  . Sexual Activity: Yes    Birth Control/ Protection: Condom   Other Topics Concern  . 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 Enjoys walking   Diet: Tries to watch what she eats. Does not eat a lot of processed foods or fast food.     Past Surgical History  Procedure Laterality Date  . Eye surgery      "Lazy Muscle"  . Spine surgery      L5-s1  . Vaginal hysterectomy      Family History  Problem Relation Age of Onset  . Cancer Mother     LUNG   . Breast cancer Mother     Died    No Known Allergies  Current Outpatient Prescriptions on File Prior to Visit  Medication Sig Dispense Refill  . albuterol (PROVENTIL HFA;VENTOLIN HFA) 108 (90 BASE) MCG/ACT inhaler Inhale 2 puffs into the lungs every 6 (six) hours as needed.    Marland Kitchen aspirin 81 MG tablet Take 81 mg by mouth daily.    Marland Kitchen atorvastatin (LIPITOR) 20 MG tablet TAKE 1 TABLET BY MOUTH EVERY DAY 90  tablet 3  . cholecalciferol (VITAMIN D) 1000 UNITS tablet Take 1,000 Units by mouth daily.    . clobetasol cream (TEMOVATE) 0.05 % Apply 2 times weekly when necessary 30 g 5  . clotrimazole-betamethasone (LOTRISONE) cream Apply 1 application topically 2 (two) times daily. 30 g 0  . cyanocobalamin 100 MCG tablet Take 500 mcg by mouth daily.    . cyclobenzaprine (FLEXERIL) 10 MG tablet Take 1 tablet (10 mg total) by mouth 3 (three) times daily as needed for muscle spasms. 30 tablet 0  . docusate sodium (COLACE) 50 MG capsule Take by mouth as needed. Over the counter Equate stool softener    . estradiol (ESTRACE) 1 MG tablet Take 1 tablet (1 mg total) by mouth daily. 90 tablet 4  . fluticasone (FLONASE) 50 MCG/ACT nasal spray Place 2 sprays into both nostrils daily. 16 g 6  . Fluticasone-Salmeterol (ADVAIR) 250-50 MCG/DOSE AEPB Inhale 1  puff into the lungs as needed.    . gabapentin (NEURONTIN) 100 MG capsule TAKE 1 CAPSULE (100 MG TOTAL) BY MOUTH AT BEDTIME. 30 capsule 0  . KLOR-CON M20 20 MEQ tablet TAKE 1 TABLET BY MOUTH EVERY DAY 30 tablet 5  . lansoprazole (PREVACID) 30 MG capsule TAKE 1 CAPSULE BY MOUTH ONCE DAILY 90 capsule 3  . levocetirizine (XYZAL) 5 MG tablet Take 5 mg by mouth every evening.     . loratadine (CLARITIN) 10 MG tablet Take 1 tablet (10 mg total) by mouth daily. 30 tablet 11  . losartan-hydrochlorothiazide (HYZAAR) 50-12.5 MG per tablet Take 1 tablet by mouth daily. 80 tablet 3  . losartan-hydrochlorothiazide (HYZAAR) 50-12.5 MG per tablet TAKE 1 TABLET BY MOUTH DAILY. 30 tablet 0  . methylPREDNISolone (MEDROL DOSEPAK) 4 MG TBPK tablet follow package directions 21 tablet 0  . montelukast (SINGULAIR) 10 MG tablet Take 10 mg by mouth at bedtime.    Marland Kitchen oxyCODONE (OXY IR/ROXICODONE) 5 MG immediate release tablet Take 1 tablet (5 mg total) by mouth daily as needed for severe pain. 30 tablet 0  . TRIBENZOR 40-5-12.5 MG TABS TAKE 1 TABLET BY MOUTH EVERY DAY 30 tablet 3   No current facility-administered medications on file prior to visit.    BP 120/80 mmHg  Temp(Src) 98.2 F (36.8 C) (Oral)  Ht 5\' 6"  (1.676 m)  Wt 211 lb 4.8 oz (95.845 kg)  BMI 34.12 kg/m2       Objective:   Physical Exam  Constitutional: She is oriented to person, place, and time. She appears well-developed and well-nourished. No distress.  HENT:  Head: Normocephalic and atraumatic.  Right Ear: External ear normal.  Left Ear: External ear normal.  Nose: Nose normal.  Mouth/Throat: Oropharynx is clear and moist. No oropharyngeal exudate.  Tm's visualized. No cerumen impaction or acute infection  Eyes: Conjunctivae and EOM are normal. Pupils are equal, round, and reactive to light. Right eye exhibits no discharge. Left eye exhibits no discharge.  No vertical or horizontal nystagmus  Neck: Normal range of motion. Neck supple.    Cardiovascular: Normal rate, regular rhythm, normal heart sounds and intact distal pulses.  Exam reveals no friction rub.   No murmur heard. Pulmonary/Chest: Effort normal and breath sounds normal. No respiratory distress. She has no wheezes. She has no rales. She exhibits no tenderness.  Lymphadenopathy:    She has no cervical adenopathy.  Neurological: She is alert and oriented to person, place, and time.  Skin: Skin is warm and dry. No rash noted. She is not diaphoretic.  No erythema. No pallor.  Psychiatric: She has a normal mood and affect. Her behavior is normal. Judgment and thought content normal.  Nursing note and vitals reviewed.      Assessment & Plan:  1. Dysuria - POCT urinalysis dipstick; Standing- Negative - POCT urinalysis dipstick  2. Lightheadedness - Likely due to dehydration and/or seasonal allergy medication use.  - Drink more water - Stop taking Claritin for the next few days to see if lightheadedness resolves.  - Follow up if no improvement.  - Consider vestibular rehab

## 2015-05-27 ENCOUNTER — Other Ambulatory Visit (INDEPENDENT_AMBULATORY_CARE_PROVIDER_SITE_OTHER): Payer: Medicare Other

## 2015-05-27 ENCOUNTER — Other Ambulatory Visit: Payer: Medicare Other

## 2015-05-27 DIAGNOSIS — E039 Hypothyroidism, unspecified: Secondary | ICD-10-CM | POA: Diagnosis not present

## 2015-05-27 DIAGNOSIS — R300 Dysuria: Secondary | ICD-10-CM

## 2015-05-27 DIAGNOSIS — J3089 Other allergic rhinitis: Secondary | ICD-10-CM | POA: Diagnosis not present

## 2015-05-27 DIAGNOSIS — I10 Essential (primary) hypertension: Secondary | ICD-10-CM

## 2015-05-27 DIAGNOSIS — D649 Anemia, unspecified: Secondary | ICD-10-CM | POA: Diagnosis not present

## 2015-05-27 DIAGNOSIS — E785 Hyperlipidemia, unspecified: Secondary | ICD-10-CM | POA: Diagnosis not present

## 2015-05-27 LAB — BASIC METABOLIC PANEL
BUN: 13 mg/dL (ref 6–23)
CHLORIDE: 104 meq/L (ref 96–112)
CO2: 31 meq/L (ref 19–32)
CREATININE: 0.84 mg/dL (ref 0.40–1.20)
Calcium: 9.6 mg/dL (ref 8.4–10.5)
GFR: 90.75 mL/min (ref >60.00)
GLUCOSE: 97 mg/dL (ref 70–99)
POTASSIUM: 4.1 meq/L (ref 3.5–5.1)
Sodium: 141 mEq/L (ref 135–145)

## 2015-05-27 LAB — CBC WITH DIFFERENTIAL/PLATELET
Basophils Absolute: 0 K/uL (ref 0.0–0.1)
Basophils Relative: 0.5 % (ref 0.0–3.0)
Eosinophils Absolute: 0.1 K/uL (ref 0.0–0.7)
Eosinophils Relative: 1.7 % (ref 0.0–5.0)
HCT: 40.2 % (ref 36.0–46.0)
Hemoglobin: 12.8 g/dL (ref 12.0–15.0)
Lymphocytes Relative: 39.7 % (ref 12.0–46.0)
Lymphs Abs: 3.4 K/uL (ref 0.7–4.0)
MCHC: 31.9 g/dL (ref 30.0–36.0)
MCV: 82.3 fl (ref 78.0–100.0)
Monocytes Absolute: 0.7 K/uL (ref 0.1–1.0)
Monocytes Relative: 8.3 % (ref 3.0–12.0)
Neutro Abs: 4.2 K/uL (ref 1.4–7.7)
Neutrophils Relative %: 49.8 % (ref 43.0–77.0)
Platelets: 176 K/uL (ref 150.0–400.0)
RBC: 4.89 Mil/uL (ref 3.87–5.11)
RDW: 15 % (ref 11.5–15.5)
WBC: 8.5 K/uL (ref 4.0–10.5)

## 2015-05-27 LAB — POCT URINALYSIS DIPSTICK
Bilirubin, UA: NEGATIVE
Blood, UA: NEGATIVE
Glucose, UA: NEGATIVE
Ketones, UA: NEGATIVE
Leukocytes, UA: NEGATIVE
Nitrite, UA: NEGATIVE
Protein, UA: NEGATIVE
Spec Grav, UA: 1.015
Urobilinogen, UA: 0.2
pH, UA: 6.5

## 2015-05-27 LAB — LIPID PANEL
Cholesterol: 172 mg/dL (ref 0–200)
HDL: 71 mg/dL
LDL Cholesterol: 83 mg/dL (ref 0–99)
NonHDL: 100.63
Total CHOL/HDL Ratio: 2
Triglycerides: 89 mg/dL (ref 0.0–149.0)
VLDL: 17.8 mg/dL (ref 0.0–40.0)

## 2015-05-27 LAB — HEPATIC FUNCTION PANEL
ALT: 18 U/L (ref 0–35)
AST: 17 U/L (ref 0–37)
Albumin: 4 g/dL (ref 3.5–5.2)
Alkaline Phosphatase: 58 U/L (ref 39–117)
Bilirubin, Direct: 0.1 mg/dL (ref 0.0–0.3)
Total Bilirubin: 0.5 mg/dL (ref 0.2–1.2)
Total Protein: 6.6 g/dL (ref 6.0–8.3)

## 2015-05-27 LAB — TSH: TSH: 0.58 u[IU]/mL (ref 0.35–4.50)

## 2015-06-02 ENCOUNTER — Encounter: Payer: Medicare Other | Admitting: Adult Health

## 2015-06-02 ENCOUNTER — Ambulatory Visit: Payer: Self-pay | Admitting: Adult Health

## 2015-06-12 DIAGNOSIS — J3089 Other allergic rhinitis: Secondary | ICD-10-CM | POA: Diagnosis not present

## 2015-06-13 ENCOUNTER — Encounter: Payer: Self-pay | Admitting: Adult Health

## 2015-06-13 ENCOUNTER — Ambulatory Visit (INDEPENDENT_AMBULATORY_CARE_PROVIDER_SITE_OTHER): Payer: Medicare Other | Admitting: Adult Health

## 2015-06-13 VITALS — BP 130/84 | HR 84 | Temp 98.5°F | Ht 66.0 in | Wt 211.7 lb

## 2015-06-13 DIAGNOSIS — R05 Cough: Secondary | ICD-10-CM | POA: Diagnosis not present

## 2015-06-13 DIAGNOSIS — R059 Cough, unspecified: Secondary | ICD-10-CM

## 2015-06-13 DIAGNOSIS — J069 Acute upper respiratory infection, unspecified: Secondary | ICD-10-CM

## 2015-06-13 MED ORDER — HYDROCODONE-HOMATROPINE 5-1.5 MG/5ML PO SYRP: 5.0000 mL | ORAL_SOLUTION | Freq: Three times a day (TID) | ORAL | Status: DC | PRN

## 2015-06-13 NOTE — Patient Instructions (Addendum)
It was great seeing you again  Your exam is consistent with a viral upper respiratory infection.   I would like you to use Mucinex to help with the cough. Take sudafed for the next two days to help with the fluid behind the ear and for the nasal congestion.  Use the cough syrup at night since it can make you sleepy.   Upper Respiratory Infection, Adult Most upper respiratory infections (URIs) are a viral infection of the air passages leading to the lungs. A URI affects the nose, throat, and upper air passages. The most common type of URI is nasopharyngitis and is typically referred to as "the common cold." URIs run their course and usually go away on their own. Most of the time, a URI does not require medical attention, but sometimes a bacterial infection in the upper airways can follow a viral infection. This is called a secondary infection. Sinus and middle ear infections are common types of secondary upper respiratory infections. Bacterial pneumonia can also complicate a URI. A URI can worsen asthma and chronic obstructive pulmonary disease (COPD). Sometimes, these complications can require emergency medical care and may be life threatening.  CAUSES Almost all URIs are caused by viruses. A virus is a type of germ and can spread from one person to another.  RISKS FACTORS You may be at risk for a URI if:   You smoke.   You have chronic heart or lung disease.  You have a weakened defense (immune) system.   You are very young or very old.   You have nasal allergies or asthma.  You work in crowded or poorly ventilated areas.  You work in health care facilities or schools. SIGNS AND SYMPTOMS  Symptoms typically develop 2-3 days after you come in contact with a cold virus. Most viral URIs last 7-10 days. However, viral URIs from the influenza virus (flu virus) can last 14-18 days and are typically more severe. Symptoms may include:   Runny or stuffy (congested) nose.   Sneezing.    Cough.   Sore throat.   Headache.   Fatigue.   Fever.   Loss of appetite.   Pain in your forehead, behind your eyes, and over your cheekbones (sinus pain).  Muscle aches.  DIAGNOSIS  Your health care provider may diagnose a URI by:  Physical exam.  Tests to check that your symptoms are not due to another condition such as:  Strep throat.  Sinusitis.  Pneumonia.  Asthma. TREATMENT  A URI goes away on its own with time. It cannot be cured with medicines, but medicines may be prescribed or recommended to relieve symptoms. Medicines may help:  Reduce your fever.  Reduce your cough.  Relieve nasal congestion. HOME CARE INSTRUCTIONS   Take medicines only as directed by your health care provider.   Gargle warm saltwater or take cough drops to comfort your throat as directed by your health care provider.  Use a warm mist humidifier or inhale steam from a shower to increase air moisture. This may make it easier to breathe.  Drink enough fluid to keep your urine clear or pale yellow.   Eat soups and other clear broths and maintain good nutrition.   Rest as needed.   Return to work when your temperature has returned to normal or as your health care provider advises. You may need to stay home longer to avoid infecting others. You can also use a face mask and careful hand washing to prevent spread of  the virus.  Increase the usage of your inhaler if you have asthma.   Do not use any tobacco products, including cigarettes, chewing tobacco, or electronic cigarettes. If you need help quitting, ask your health care provider. PREVENTION  The best way to protect yourself from getting a cold is to practice good hygiene.   Avoid oral or hand contact with people with cold symptoms.   Wash your hands often if contact occurs.  There is no clear evidence that vitamin C, vitamin E, echinacea, or exercise reduces the chance of developing a cold. However, it is  always recommended to get plenty of rest, exercise, and practice good nutrition.  SEEK MEDICAL CARE IF:   You are getting worse rather than better.   Your symptoms are not controlled by medicine.   You have chills.  You have worsening shortness of breath.  You have brown or red mucus.  You have yellow or brown nasal discharge.  You have pain in your face, especially when you bend forward.  You have a fever.  You have swollen neck glands.  You have pain while swallowing.  You have white areas in the back of your throat. SEEK IMMEDIATE MEDICAL CARE IF:   You have severe or persistent:  Headache.  Ear pain.  Sinus pain.  Chest pain.  You have chronic lung disease and any of the following:  Wheezing.  Prolonged cough.  Coughing up blood.  A change in your usual mucus.  You have a stiff neck.  You have changes in your:  Vision.  Hearing.  Thinking.  Mood. MAKE SURE YOU:   Understand these instructions.  Will watch your condition.  Will get help right away if you are not doing well or get worse.   This information is not intended to replace advice given to you by your health care provider. Make sure you discuss any questions you have with your health care provider.   Document Released: 11/03/2000 Document Revised: 09/24/2014 Document Reviewed: 08/15/2013 Elsevier Interactive Patient Education [employer].

## 2015-06-13 NOTE — Progress Notes (Signed)
Pre visit review using our clinic review tool, if applicable. No additional management support is needed unless otherwise documented below in the visit note. 

## 2015-06-13 NOTE — Progress Notes (Signed)
   Subjective:    Patient ID: Kristin Merritt, female    DOB: 01/19/1961, 55 y.o.   MRN: [id number]  HPI  55 year old female who presents to the office today for an acute issue. Since Monday she has had a productive cough, nasal congestion, rhinorrhea, and sore throat.   She denies any sinus pain and pressure, fevers, body aches, nausea, vomiting or diarrhea.   Has tried OTC Delsym, which helped suppress the cough.   Review of Systems  Constitutional: Positive for fatigue. Negative for fever, chills and activity change.  HENT: Positive for congestion, postnasal drip, rhinorrhea and sore throat. Negative for ear discharge, ear pain, sinus pressure and trouble swallowing.   Eyes: Negative.   Respiratory: Positive for cough and wheezing. Negative for shortness of breath.   Cardiovascular: Negative.   Neurological: Negative.   Hematological: Positive for adenopathy.  Psychiatric/Behavioral: Positive for sleep disturbance.  All other systems reviewed and are negative.      Objective:   Physical Exam  Constitutional: She is oriented to person, place, and time. She appears well-developed and well-nourished. No distress.  HENT:  Head: Normocephalic and atraumatic.  Right Ear: External ear normal.  Left Ear: External ear normal.  Nose: Nose normal.  Mouth/Throat: Oropharynx is clear and moist. No oropharyngeal exudate.  Trace fluid behind left ear  Neck: Normal range of motion. Neck supple.  Cardiovascular: Normal rate, regular rhythm, normal heart sounds and intact distal pulses.  Exam reveals no gallop and no friction rub.   No murmur heard. Pulmonary/Chest: Effort normal and breath sounds normal. No respiratory distress. She has no wheezes. She has no rales. She exhibits no tenderness.  Lymphadenopathy:    She has cervical adenopathy (cervical L > R).  Neurological: She is alert and oriented to person, place, and time.  Skin: Skin is warm and dry. No rash noted. She is not  diaphoretic. No erythema. No pallor.  Psychiatric: She has a normal mood and affect. Her behavior is normal. Judgment and thought content normal.  Nursing note and vitals reviewed.      Assessment & Plan:  1. URI (upper respiratory infection) - No concern for pneumonia or bronchitis at this time. - HYDROcodone-homatropine (HYCODAN) 5-1.5 MG/5ML syrup; Take 5 mLs by mouth every 8 (eight) hours as needed for cough.  Dispense: 120 mL; Refill: 0 - Sudafed for two days.  - Flonase - Stay well hydrated and rest - Warm salt water gargles if needed - Follow up if no improvement.   2. Cough - HYDROcodone-homatropine (HYCODAN) 5-1.5 MG/5ML syrup; Take 5 mLs by mouth every 8 (eight) hours as needed for cough.  Dispense: 120 mL; Refill: 0

## 2015-06-20 ENCOUNTER — Other Ambulatory Visit: Payer: Self-pay | Admitting: Family

## 2015-06-20 ENCOUNTER — Other Ambulatory Visit: Payer: Self-pay | Admitting: Gynecology

## 2015-06-26 DIAGNOSIS — J3089 Other allergic rhinitis: Secondary | ICD-10-CM | POA: Diagnosis not present

## 2015-07-02 DIAGNOSIS — J3089 Other allergic rhinitis: Secondary | ICD-10-CM | POA: Diagnosis not present

## 2015-07-07 ENCOUNTER — Other Ambulatory Visit: Payer: Self-pay | Admitting: Family

## 2015-07-10 ENCOUNTER — Encounter: Payer: Self-pay | Admitting: Gynecology

## 2015-07-10 ENCOUNTER — Ambulatory Visit (INDEPENDENT_AMBULATORY_CARE_PROVIDER_SITE_OTHER): Payer: Medicare Other | Admitting: Gynecology

## 2015-07-10 ENCOUNTER — Other Ambulatory Visit (HOSPITAL_COMMUNITY)
Admission: RE | Admit: 2015-07-10 | Discharge: 2015-07-10 | Disposition: A | Discharge status: Home / Self Care | Payer: Medicare Other | Source: Ambulatory Visit | Attending: Gynecology | Admitting: Gynecology

## 2015-07-10 VITALS — BP 134/80 | Ht 66.0 in | Wt 210.0 lb

## 2015-07-10 DIAGNOSIS — Z01419 Encounter for gynecological examination (general) (routine) without abnormal findings: Secondary | ICD-10-CM | POA: Insufficient documentation

## 2015-07-10 DIAGNOSIS — Z79890 Hormone replacement therapy (postmenopausal): Secondary | ICD-10-CM

## 2015-07-10 DIAGNOSIS — L900 Lichen sclerosus et atrophicus: Secondary | ICD-10-CM

## 2015-07-10 DIAGNOSIS — D071 Carcinoma in situ of vulva: Secondary | ICD-10-CM | POA: Diagnosis not present

## 2015-07-10 DIAGNOSIS — L602 Onychogryphosis: Secondary | ICD-10-CM | POA: Diagnosis not present

## 2015-07-10 DIAGNOSIS — Z1151 Encounter for screening for human papillomavirus (HPV): Secondary | ICD-10-CM | POA: Insufficient documentation

## 2015-07-10 DIAGNOSIS — M722 Plantar fascial fibromatosis: Secondary | ICD-10-CM | POA: Diagnosis not present

## 2015-07-10 DIAGNOSIS — Z780 Asymptomatic menopausal state: Secondary | ICD-10-CM

## 2015-07-10 DIAGNOSIS — B351 Tinea unguium: Secondary | ICD-10-CM | POA: Diagnosis not present

## 2015-07-10 MED ORDER — CLOBETASOL PROPIONATE 0.05 % EX CREA: 1.0000 "application " | TOPICAL_CREAM | Freq: Two times a day (BID) | CUTANEOUS | Status: DC

## 2015-07-10 MED ORDER — ESTRADIOL 1 MG PO TABS: 1.0000 mg | ORAL_TABLET | Freq: Every day | ORAL | Status: DC

## 2015-07-10 NOTE — Progress Notes (Signed)
Kristin Merritt Aug 23, 1960 [id number]   History:    55 y.o.  for annual gyn exam who was seen in the office for the first time as a new patient back in 2014 and then once again in 2015.Patient had moved To [facility] from [facility] Because of patient's vasomotor symptoms she was started on Estrace 1 mg by mouth daily and it has curtailed her vasomotor symptoms. Patient has informed me that several years ago in [place]. 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 Her PCP has been doing her blood work. She scheduled to see me in the next couple weeks. She is overdue for mammogram. She has not had a bone density study. She has not had a colonoscopy yet either.  Past medical history,surgical history, family history and social history were all reviewed and documented in the EPIC chart.  Gynecologic History No LMP recorded. Patient has had a hysterectomy. Contraception: status post hysterectomy Last Pap: 2015. Results were: normal Last mammogram: 2015. Results were: normal  Obstetric History OB History  Gravida Para Term Preterm AB SAB TAB Ectopic Multiple Living  1 1        1     # Outcome Date GA Lbr Len/2nd Weight Sex Delivery Anes PTL Lv  1 Para                ROS: A ROS was performed and pertinent positives and negatives are included in the history.  GENERAL: No fevers or chills. HEENT: No change in vision, no earache, sore throat or sinus congestion. NECK: No pain or stiffness. CARDIOVASCULAR: No chest pain or pressure. No palpitations. PULMONARY: No shortness of breath, cough or wheeze. GASTROINTESTINAL: No abdominal pain, nausea, vomiting or diarrhea, melena or bright red blood per rectum. GENITOURINARY: No urinary frequency, urgency, hesitancy or dysuria. MUSCULOSKELETAL: No joint or muscle pain, no back pain, no recent trauma.  DERMATOLOGIC: No rash, no itching, no lesions. ENDOCRINE: No polyuria, polydipsia, no heat or cold intolerance. No recent change in weight. HEMATOLOGICAL: No anemia or easy bruising or bleeding. NEUROLOGIC: No headache, seizures, numbness, tingling or weakness. PSYCHIATRIC: No depression, no loss of interest in normal activity or change in sleep pattern.     Exam: chaperone present  BP 134/80 mmHg  Ht 5\' 6"  (1.676 m)  Wt 210 lb (95.255 kg)  BMI 33.91 kg/m2  Body mass index is 33.91 kg/(m^2).  General appearance : Well developed well nourished female. No acute distress HEENT: Eyes: no retinal hemorrhage or exudates,  Neck supple, trachea midline, no carotid bruits, no thyroidmegaly Lungs: Clear to auscultation, no rhonchi or wheezes, or rib retractions  Heart: Regular rate and rhythm, no murmurs or gallops Breast:Examined in sitting and supine position were symmetrical in appearance, no palpable masses or tenderness,  no skin retraction, no nipple inversion, no nipple discharge, no skin discoloration, no axillary or supraclavicular lymphadenopathy Abdomen: no palpable masses or tenderness, no rebound or guarding Extremities: no edema or skin discoloration or tenderness  Pelvic:  Bartholin, Urethra, Skene Glands: Within normal limits             Vagina: No gross lesions or discharge  Cervix: Absent  Uterus  absent  Adnexa  Without masses or tenderness  Anus and perineum  normal   Rectovaginal  normal sphincter tone without palpated masses or tenderness  Hemoccult colonoscopy this year     Assessment/Plan:  55 y.o. female for annual exam was provided with prescription refill for Estrace 1 mg one by mouth daily. She was provided with a requisition to schedule her overdue mammogram. She was provided with names of community gastroenterologist her to schedule a colonoscopy. Her Pap smear with HPV screening was done today. She will talk with her PCP in the next few weeks about  scheduling her bone density study. We are going to have the patient sign a medical release form so that we can obtain records from [place] to review and have the patient come back for colposcopic evaluation of external genitalia because her past history of VIN III No history still not clear. She was instructed to do her monthly breast examination. Because of her history of lichen sclerosus she's going to be prescribed clobetasol to apply twice a day for one week then twice a week or once a week thereafter.   Terrance Mass MD, 3:11 PM 07/10/2015

## 2015-07-10 NOTE — Patient Instructions (Signed)

## 2015-07-10 NOTE — Addendum Note (Signed)
Addended by: Thurnell Garbe A on: 07/10/2015 03:20 PM   Modules accepted: Orders

## 2015-07-11 ENCOUNTER — Telehealth: Payer: Self-pay | Admitting: *Deleted

## 2015-07-11 MED ORDER — CLOBETASOL PROPIONATE 0.05 % EX CREA: TOPICAL_CREAM | CUTANEOUS | Status: DC

## 2015-07-11 NOTE — Telephone Encounter (Signed)
Pharmacy faxed to clarify directions for temovate 0.05% cream Rx was sent with 2 different direction. Per note on 07/10/15 "clobetasol to apply twice a day for one week then twice a week or once a week thereafter.

## 2015-07-14 LAB — CYTOLOGY - PAP

## 2015-07-16 ENCOUNTER — Other Ambulatory Visit: Payer: Self-pay

## 2015-07-16 DIAGNOSIS — Z1231 Encounter for screening mammogram for malignant neoplasm of breast: Secondary | ICD-10-CM

## 2015-07-18 DIAGNOSIS — J3089 Other allergic rhinitis: Secondary | ICD-10-CM | POA: Diagnosis not present

## 2015-07-25 ENCOUNTER — Ambulatory Visit
Admission: RE | Admit: 2015-07-25 | Discharge: 2015-07-25 | Disposition: A | Discharge status: Home / Self Care | Payer: Medicare Other | Source: Ambulatory Visit

## 2015-07-25 DIAGNOSIS — J3089 Other allergic rhinitis: Secondary | ICD-10-CM | POA: Diagnosis not present

## 2015-07-25 DIAGNOSIS — Z1231 Encounter for screening mammogram for malignant neoplasm of breast: Secondary | ICD-10-CM

## 2015-07-31 ENCOUNTER — Ambulatory Visit: Payer: Medicare Other | Admitting: Gynecology

## 2015-08-01 DIAGNOSIS — J3089 Other allergic rhinitis: Secondary | ICD-10-CM | POA: Diagnosis not present

## 2015-08-07 ENCOUNTER — Encounter: Payer: Self-pay | Admitting: Adult Health

## 2015-08-07 ENCOUNTER — Ambulatory Visit (INDEPENDENT_AMBULATORY_CARE_PROVIDER_SITE_OTHER): Payer: Medicare Other | Admitting: Adult Health

## 2015-08-07 VITALS — BP 130/84 | Temp 99.4°F | Ht 66.0 in | Wt 210.8 lb

## 2015-08-07 DIAGNOSIS — Z0000 Encounter for general adult medical examination without abnormal findings: Secondary | ICD-10-CM | POA: Diagnosis not present

## 2015-08-07 DIAGNOSIS — E785 Hyperlipidemia, unspecified: Secondary | ICD-10-CM | POA: Diagnosis not present

## 2015-08-07 DIAGNOSIS — I10 Essential (primary) hypertension: Secondary | ICD-10-CM

## 2015-08-07 DIAGNOSIS — J3089 Other allergic rhinitis: Secondary | ICD-10-CM | POA: Diagnosis not present

## 2015-08-07 NOTE — Progress Notes (Signed)
Pre visit review using our clinic review tool, if applicable. No additional management support is needed unless otherwise documented below in the visit note. 

## 2015-08-07 NOTE — Progress Notes (Signed)
Subjective:    Patient ID: Kristin Merritt, female    DOB: [birth year], 55 y.o.   MRN: [id number]  HPI  Patient presents for yearly preventative medicine examination. Medicare questionnaire was completed  All immunizations and health maintenance protocols were reviewed with the patient and needed orders were placed.  Medication reconciliation,  past medical history, social history, problem list and allergies were reviewed in detail with the patient  Goals were established with regard to weight loss, exercise, and  diet in compliance with medications  End of life planning was discussed.  She had her GYN exam last month,mammogram last week.   She does not do home breast exams.   She is not dieting and is not exercising.   Review of Systems  Constitutional: Negative.   HENT: Negative.   Eyes: Negative.   Respiratory: Negative.   Cardiovascular: Negative.   Gastrointestinal: Negative.   Endocrine: Negative.   Genitourinary: Negative.   Musculoskeletal: Negative.   Skin: Negative.   Allergic/Immunologic: Negative.   Neurological: Negative.   Hematological: Negative.   Psychiatric/Behavioral: Negative.   All other systems reviewed and are negative.  Past Medical History  Diagnosis Date  . Allergy   . Anxiety   . Hypertension   . Lichen sclerosus et atrophicus   . 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 Postmenopausal HRT (hormone replacement therapy) - followed by Dr. Toney Rakes in gyn 07/11/2012  . Asthma   . Uterine cancer ([place]) 1991  . Depression   . High cholesterol   . UTI (urinary tract infection)     Social History   Social History  . Marital Status: Widowed    Spouse Name: N/A  . Number of Children: N/A  . Years of Education: N/A   Occupational History  . Not on file.   Social History Main Topics  . Smoking status: Former Smoker -- 1.00 packs/day for 20 years    Types: Cigarettes    Quit date: 06/05/2012  .  Smokeless tobacco: Never Used  . Alcohol Use: No  . Drug Use: No  . Sexual Activity: Yes    Birth Control/ Protection: Condom   Other Topics Concern  . 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 Enjoys walking   Diet: Tries to watch what she eats. Does not eat a lot of processed foods or fast food.     Past Surgical History  Procedure Laterality Date  . Eye surgery      "Lazy Muscle"  . Spine surgery      L5-s1  . Vaginal hysterectomy      Family History  Problem Relation Age of Onset  . Cancer Mother     LUNG   . Breast cancer Mother     Died    No Known Allergies  Current Outpatient Prescriptions on File Prior to Visit  Medication Sig Dispense Refill  . albuterol (PROVENTIL HFA;VENTOLIN HFA) 108 (90 BASE) MCG/ACT inhaler Inhale 2 puffs into the lungs every 6 (six) hours as needed.    Marland Kitchen aspirin 81 MG tablet Take 81 mg by mouth daily.    Marland Kitchen atorvastatin (LIPITOR) 20 MG tablet TAKE 1 TABLET BY MOUTH EVERY DAY 90 tablet 3  .  cholecalciferol (VITAMIN D) 1000 UNITS tablet Take 1,000 Units by mouth daily.    . clobetasol cream (TEMOVATE) 0.05 % Apply twice a day for one week then twice a week or once a week thereafter. 30 g 2  . docusate sodium (COLACE) 50 MG capsule Take by mouth as needed. Over the counter Equate stool softener    . estradiol (ESTRACE) 1 MG tablet Take 1 tablet (1 mg total) by mouth daily. 90 tablet 4  . fluticasone (FLONASE) 50 MCG/ACT nasal spray Place 2 sprays into both nostrils daily. 16 g 6  . Fluticasone-Salmeterol (ADVAIR) 250-50 MCG/DOSE AEPB Inhale 1 puff into the lungs as needed.    . gabapentin (NEURONTIN) 100 MG capsule TAKE 1 CAPSULE (100 MG TOTAL) BY MOUTH AT BEDTIME. 30 capsule 0  . KLOR-CON M20 20 MEQ tablet TAKE 1 TABLET BY MOUTH EVERY DAY 30 tablet 5  . lansoprazole (PREVACID) 30 MG  capsule TAKE 1 CAPSULE BY MOUTH ONCE DAILY 90 capsule 3  . levocetirizine (XYZAL) 5 MG tablet Take 5 mg by mouth every evening.     Marland Kitchen losartan-hydrochlorothiazide (HYZAAR) 50-12.5 MG per tablet TAKE 1 TABLET BY MOUTH DAILY. 30 tablet 0  . montelukast (SINGULAIR) 10 MG tablet Take 10 mg by mouth at bedtime.    Marland Kitchen oxyCODONE (OXY IR/ROXICODONE) 5 MG immediate release tablet Take 1 tablet (5 mg total) by mouth daily as needed for severe pain. 30 tablet 0  . cyanocobalamin 100 MCG tablet Take 500 mcg by mouth daily. Reported on 08/07/2015     No current facility-administered medications on file prior to visit.    BP 130/84 mmHg  Temp(Src) 99.4 F (37.4 C) (Oral)  Ht 5\' 6"  (1.676 m)  Wt 210 lb 12.8 oz (95.618 kg)  BMI 34.04 kg/m2       Objective:   Physical Exam  Constitutional: She is oriented to person, place, and time. She appears well-developed and well-nourished. No distress.  HENT:  Head: Normocephalic and atraumatic.  Right Ear: External ear normal.  Left Ear: External ear normal.  Nose: Nose normal.  Mouth/Throat: Oropharynx is clear and moist. No oropharyngeal exudate.  Eyes: Conjunctivae and EOM are normal. Pupils are equal, round, and reactive to light. Right eye exhibits no discharge. Left eye exhibits no discharge. No scleral icterus.  Neck: Normal range of motion. Neck supple. No JVD present. No tracheal deviation present. No thyromegaly present.  Cardiovascular: Normal rate, regular rhythm, normal heart sounds and intact distal pulses.  Exam reveals no gallop and no friction rub.   No murmur heard. Pulmonary/Chest: Effort normal and breath sounds normal. No stridor. No respiratory distress. She has no wheezes. She has no rales. She exhibits no tenderness.  Abdominal: Soft. Bowel sounds are normal. She exhibits no distension and no mass. There is no tenderness. There is no rebound and no guarding.  Slightly obese  Genitourinary:  Deferred   Musculoskeletal: Normal range of  motion. She exhibits no edema or tenderness.  Lymphadenopathy:    She has no cervical adenopathy.  Neurological: She is alert and oriented to person, place, and time. She has normal reflexes. She displays normal reflexes. No cranial nerve deficit. She exhibits normal muscle tone. Coordination normal.  Skin: Skin is warm and dry. No rash noted. She is not diaphoretic. No erythema. No pallor.  Psychiatric: She has a normal mood and affect. Her behavior is normal. Judgment and thought content normal.  Nursing note and vitals reviewed.     Assessment & Plan:  1. Routine general medical examination at a health care facility - Reviewed labs and medicare wellness paperwork - Needs to start exercising and working on diet - Follow up in one year for next MWE - Follow up sooner with any acute problems  2. Essential hypertension, benign BP Readings from Last 3 Encounters:  08/07/15 130/84  07/10/15 134/80  06/13/15 130/84  - Will continue to monitor.    3. Hyperlipemia - Controlled on Lipitor.

## 2015-08-15 DIAGNOSIS — J3089 Other allergic rhinitis: Secondary | ICD-10-CM | POA: Diagnosis not present

## 2015-08-19 ENCOUNTER — Ambulatory Visit (INDEPENDENT_AMBULATORY_CARE_PROVIDER_SITE_OTHER): Payer: Medicare Other

## 2015-08-19 DIAGNOSIS — J3089 Other allergic rhinitis: Secondary | ICD-10-CM | POA: Diagnosis not present

## 2015-08-19 DIAGNOSIS — Z111 Encounter for screening for respiratory tuberculosis: Secondary | ICD-10-CM | POA: Diagnosis not present

## 2015-08-21 ENCOUNTER — Other Ambulatory Visit: Payer: Self-pay | Admitting: Adult Health

## 2015-08-21 DIAGNOSIS — Z111 Encounter for screening for respiratory tuberculosis: Secondary | ICD-10-CM

## 2015-08-21 LAB — TB SKIN TEST
Induration: 0 mm
TB SKIN TEST: NEGATIVE

## 2015-08-25 DIAGNOSIS — J3089 Other allergic rhinitis: Secondary | ICD-10-CM | POA: Diagnosis not present

## 2015-08-27 DIAGNOSIS — J3089 Other allergic rhinitis: Secondary | ICD-10-CM | POA: Diagnosis not present

## 2015-09-12 DIAGNOSIS — J3089 Other allergic rhinitis: Secondary | ICD-10-CM | POA: Diagnosis not present

## 2015-09-18 ENCOUNTER — Ambulatory Visit (INDEPENDENT_AMBULATORY_CARE_PROVIDER_SITE_OTHER): Payer: Medicare Other | Admitting: Gynecology

## 2015-09-18 ENCOUNTER — Encounter: Payer: Self-pay | Admitting: Gynecology

## 2015-09-18 VITALS — BP 130/86

## 2015-09-18 DIAGNOSIS — J3089 Other allergic rhinitis: Secondary | ICD-10-CM | POA: Diagnosis not present

## 2015-09-18 DIAGNOSIS — D071 Carcinoma in situ of vulva: Secondary | ICD-10-CM | POA: Insufficient documentation

## 2015-09-18 NOTE — Patient Instructions (Signed)
Colposcopy  Colposcopy is a procedure to examine your cervix and vagina, or the area around the outside of your vagina, for abnormalities or signs of disease. The procedure is done using a lighted microscope called a colposcope. Tissue samples may be collected during the colposcopy if your health care provider finds any unusual cells. A colposcopy may be done if a woman has:  · An abnormal Pap test. A Pap test is a medical test done to evaluate cells that are on the surface of the cervix.  · A Pap test result that is suggestive of human papillomavirus (HPV). This virus can cause genital warts and is linked to the development of cervical cancer.  · A sore on her cervix and the results of a Pap test were normal.  · Genital warts on the cervix or in or around the outside of the vagina.  · A mother who took the drug diethylstilbestrol (DES) while pregnant.  · Painful intercourse.  · Vaginal bleeding, especially after sexual intercourse.  LET YOUR HEALTH CARE PROVIDER KNOW ABOUT:  · Any allergies you have.  · All medicines you are taking, including vitamins, herbs, eye drops, creams, and over-the-counter medicines.  · Previous problems you or members of your family have had with the use of anesthetics.  · Any blood disorders you have.  · Previous surgeries you have had.  · Medical conditions you have.  RISKS AND COMPLICATIONS  Generally, a colposcopy is a safe procedure. However, as with any procedure, complications can occur. Possible complications include:  · Bleeding.  · Infection.  · Missed lesions.  BEFORE THE PROCEDURE   · Tell your health care provider if you have your menstrual period. A colposcopy typically is not done during menstruation.  · For 24 hours before the colposcopy, do not:    Douche.    Use tampons.    Use medicines, creams, or suppositories in the vagina.    Have sexual intercourse.  PROCEDURE   During the procedure, you will be lying on your back with your feet in foot rests (stirrups). A warm  metal or plastic instrument (speculum) will be placed in your vagina to keep it open and to allow the health care provider to see the cervix. The colposcope will be placed outside the vagina. It will be used to magnify and examine the cervix, vagina, and the area around the outside of the vagina. A small amount of liquid solution will be placed on the area that is to be viewed. This solution will make it easier to see the abnormal cells. Your health care provider will use tools to suck out mucus and cells from the canal of the cervix. Then he or she will record the location of the abnormal areas.  If a biopsy is done during the procedure, a medicine will usually be given to numb the area (local anesthetic). You may feel mild pain or cramping while the biopsy is done. After the procedure, tissue samples collected during the biopsy will be sent to a lab for analysis.  AFTER THE PROCEDURE   You will be given instructions on when to follow up with your health care provider for your test results. It is important to keep your appointment.     This information is not intended to replace advice given to you by your health care provider. Make sure you discuss any questions you have with your health care provider.     Document Released: 07/31/2002 Document Revised: 01/10/2013 Document Reviewed: 12/07/2012    Elsevier Interactive Patient Education ©2016 [employer]

## 2015-09-18 NOTE — Progress Notes (Addendum)
   Patient is a 55 year old who was seen in the office for the first time in 2014 then in 2015 and now most recently on February of this year. Patient had moved to [place] from [place] and is on Estrace 1 mg daily for vasomotor symptoms. She had mentioned that many years ago shortly before she left [place] 2 moved to [place] she had a vulvar biopsy demonstrated VIN 3 she also been treated for lichen sclerosus in the past for which recently had given her prescription for clobetasol. Patient also had an vaginal hysterectomy several years ago indication is not clear. Her Pap smear at the last office visit was normal and I asked her to return today for a detail colposcopic evaluation as we waited for a copy of her pathology from [place] to see what type of diagnosis and follow-up on her vulvar dysplasia and had done. She stated that just on the biopsy but she did not return back in moved to [place].  Patient underwent a detail colposcopic evaluation of the external genitalia, perineum and perirectal region. Acetic acid was applied. At the area of the fourchette there was a leukoplakic raised white area. The speculum was introduced into the vagina and acetic acid was applied no lesions were seen in the vaginal mucosa over the vaginal cuff. Picture of findings as follows:  Physical Exam  Genitourinary:     The above area of the fourchette was cleansed with Betadine solution and 1% lidocaine was infiltrated subdermally. A key punch biopsy instrument was utilized to obtain a representative sample of this area which was submitted for histological evaluation. Silver nitrate and Monsel solution was used for hemostasis.   Assessment/plan: #1 patient with history of lichen sclerosus apply clobetasol twice a week #2 patient with past history and [place] of VIN 3 had biopsy but no follow-up. Detail colposcopic evaluation demonstrated suspicious area at the fourchette pathology report pending.  Will notify the patient with result and plan a course of management such as a wide local excision as I explained to her.    Addendum: Patient return later in the afternoon to the office and brought a copy of her pathology report from the [facility] [place] [facility] division of anatomic pathology dated 03/09/2007 as follows:  Specimen source: A. Vulvar biopsy posterior fourchette, 6:00 B. Vulvar biopsy posterior fourchette 3:00  Diagnoses: A: vulvar posterior fourchette at 6:00 biopsy: Severe dysplasia VIN 3 B. 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

## 2015-09-26 DIAGNOSIS — J3089 Other allergic rhinitis: Secondary | ICD-10-CM | POA: Diagnosis not present

## 2015-09-29 DIAGNOSIS — H2513 Age-related nuclear cataract, bilateral: Secondary | ICD-10-CM | POA: Diagnosis not present

## 2015-09-29 DIAGNOSIS — H5213 Myopia, bilateral: Secondary | ICD-10-CM | POA: Diagnosis not present

## 2015-09-29 DIAGNOSIS — H04123 Dry eye syndrome of bilateral lacrimal glands: Secondary | ICD-10-CM | POA: Diagnosis not present

## 2015-09-29 DIAGNOSIS — H524 Presbyopia: Secondary | ICD-10-CM | POA: Diagnosis not present

## 2015-09-29 DIAGNOSIS — H52223 Regular astigmatism, bilateral: Secondary | ICD-10-CM | POA: Diagnosis not present

## 2015-09-29 DIAGNOSIS — D071 Carcinoma in situ of vulva: Secondary | ICD-10-CM | POA: Diagnosis not present

## 2015-09-29 NOTE — Addendum Note (Signed)
Addended by: Thurnell Garbe A on: 09/29/2015 08:26 AM   Modules accepted: Orders

## 2015-10-01 DIAGNOSIS — J3089 Other allergic rhinitis: Secondary | ICD-10-CM | POA: Diagnosis not present

## 2015-10-07 ENCOUNTER — Ambulatory Visit (INDEPENDENT_AMBULATORY_CARE_PROVIDER_SITE_OTHER): Payer: Medicare Other | Admitting: Gynecology

## 2015-10-07 ENCOUNTER — Encounter: Payer: Self-pay | Admitting: Gynecology

## 2015-10-07 VITALS — BP 126/80 | Ht 66.0 in | Wt 210.0 lb

## 2015-10-07 DIAGNOSIS — Z8542 Personal history of malignant neoplasm of other parts of uterus: Secondary | ICD-10-CM | POA: Diagnosis not present

## 2015-10-07 DIAGNOSIS — Z01818 Encounter for other preprocedural examination: Secondary | ICD-10-CM | POA: Diagnosis not present

## 2015-10-07 DIAGNOSIS — D071 Carcinoma in situ of vulva: Secondary | ICD-10-CM | POA: Diagnosis not present

## 2015-10-07 MED ORDER — OXYCODONE-ACETAMINOPHEN 5-325 MG PO TABS: 1.0000 | ORAL_TABLET | ORAL | Status: DC | PRN

## 2015-10-07 NOTE — Progress Notes (Signed)
Kristin Merritt is an 55 y.o. 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 Patient was seen for the first time as a new patient back in 2014 and then once again in 2015. She had moved to [facility] from [facility]. Patient had informed me that prior to that several years ago she had been living in [place] and had a vulvar biopsy which demonstrated VIN 3 for which I reviewed the report but patient did not return back for more definitive treatment since she had moved to [place] she also been treated in the past for lichen sclerosus. She states that she had a transvaginal hysterectomy for early uterine cancer several years ago and never received any chemotherapy or radiation therapy. She reports her Pap smears since then have been normal we did 1 here a few months ago which was normal also. I was finally able to review the report that she brought with her from [place] whereby they reported the following that was dated 03/09/2007 from her vulvar biopsy:  Specimen source: A. Vulvar biopsy posterior fourchette, 6:00 B. Vulvar biopsy posterior fourchette 3:00  Diagnoses: A: vulvar posterior fourchette at 6:00 biopsy: Severe dysplasia VIN 3 B. 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 A key punch biopsy instrument was utilized to obtain a representative sample of this area which was submitted for histological evaluation.  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  Patient will be scheduled to undergo  wide local excision in an outpatient setting in the next few weeks.  Pertinent Gynecological History: Menses: post-menopausal Bleeding: Prior hysterectomy postmenopausal Contraception: post menopausal status DES exposure: unknown Blood transfusions: none Sexually transmitted diseases: no past history Previous GYN Procedures: Transvaginal hysterectomy, one vaginal delivery, vulvar biopsies  Last mammogram: normal Date: 2017 Last pap: normal Date: 2017 OB History: G 1, P 1   Menstrual History: Menarche age: 64 No LMP recorded. Patient has had a hysterectomy.    Past Medical History  Diagnosis Date  . Allergy   . Anxiety   . Hypertension   . Lichen sclerosus et atrophicus   . 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 Postmenopausal HRT (hormone replacement therapy) - followed by Dr. Toney Rakes in gyn 07/11/2012  . Asthma   . Uterine cancer ([place]) 1991  . Depression   . High cholesterol   . UTI (urinary tract infection)   . VIN III (vulvar intraepithelial neoplasia III)     Past Surgical History  Procedure Laterality Date  . Eye surgery      "Lazy Muscle"  . Spine surgery      L5-s1  . Vaginal hysterectomy      Family History  Problem Relation Age of Onset  . Cancer Mother     LUNG   . Breast cancer Mother     Died    Social History:  reports that she quit smoking about 3 years ago. Her smoking use included Cigarettes. She has a 20 pack-year smoking history. She has never used smokeless tobacco. She reports that she does not drink alcohol or use illicit  drugs.  Allergies: No Known Allergies   (Not in a hospital admission)  REVIEW OF SYSTEMS: A ROS was performed and pertinent positives and negatives are included in the history.  GENERAL: No fevers or chills. HEENT: No change in vision, no earache, sore throat or sinus congestion. NECK: No pain or stiffness. CARDIOVASCULAR: No chest pain or pressure. No palpitations. PULMONARY: No  shortness of breath, cough or wheeze. GASTROINTESTINAL: No abdominal pain, nausea, vomiting or diarrhea, melena or bright red blood per rectum. GENITOURINARY: No urinary frequency, urgency, hesitancy or dysuria. MUSCULOSKELETAL: No joint or muscle pain, no back pain, no recent trauma. DERMATOLOGIC: No rash, no itching, no lesions. ENDOCRINE: No polyuria, polydipsia, no heat or cold intolerance. No recent change in weight. HEMATOLOGICAL: No anemia or easy bruising or bleeding. NEUROLOGIC: No headache, seizures, numbness, tingling or weakness. PSYCHIATRIC: No depression, no loss of interest in normal activity or change in sleep pattern.     Blood pressure 126/80, height 5\' 6"  (1.676 m), weight 210 lb (95.255 kg).  Physical Exam:  HEENT:unremarkable Neck:Supple, midline, no thyroid megaly, no carotid bruits Lungs:  Clear to auscultation no rhonchi's or wheezes Heart:Regular rate and rhythm, no murmurs or gallops Breast Exam: Examined February 2017 were normal Abdomen: Soft nontender no rebound or guarding Pelvic:BUS*described above Vagina: No vaginal lesions vaginal cuff intact Cervix: Absent Uterus: Absent Adnexa: No palpable masses or tenderness Extremities: No cords, no edema Rectal: Not examined  Assessment/Plan: 55 year old patient with history of VIN 3 diagnosed in [place] in 2008 with no follow-up. Colposcopic directed biopsy confirmed VIN 3. Patient will be scheduled to undergo wide local excision of the area of the fourchette in an outpatient setting. The following risks benefits and pros and cons of the upcoming surgery were discussed as follows:                      Patient was counseled as to the risk of surgery to include the following:  1. Infection (prohylactic antibiotics will be administered)  2. DVT/Pulmonary Embolism (prophylactic pneumo compression stockings will be used)  3.Trauma to internal organs requiring additional surgical procedure to repair any injury to      Internal organs requiring perhaps additional hospitalization days.  4.Hemmorhage requiring transfusion and blood products which carry risks such as             anaphylactic reaction, hepatitis and AIDS  Patient had received literature information on the procedure scheduled and all her questions were answered and fully accepts all risk.   [facility] [id number] AMTD

## 2015-10-08 ENCOUNTER — Telehealth: Payer: Self-pay

## 2015-10-08 DIAGNOSIS — L602 Onychogryphosis: Secondary | ICD-10-CM | POA: Diagnosis not present

## 2015-10-08 NOTE — Telephone Encounter (Signed)
I called patient and talked with her about scheduling. She has traditional Medicare and a secondary supplement plan. I did not talk with her about estimated prepayment as with Medicare and two ins plans we do not usually request pre payment.  We discussed dates and I offered her first available 10/21/15.  Patient declined stating she will be away from 10/17/15 throught 10/28/15.  I offered her 11/18/15 and that worked well for her.  Case will be at 7:30am and Dr. Moshe Salisbury did not need another appointment with her prior. She will wait to hear from [facility] with all her instructions.

## 2015-10-09 DIAGNOSIS — J3089 Other allergic rhinitis: Secondary | ICD-10-CM | POA: Diagnosis not present

## 2015-10-15 DIAGNOSIS — J3089 Other allergic rhinitis: Secondary | ICD-10-CM | POA: Diagnosis not present

## 2015-10-23 DIAGNOSIS — J3089 Other allergic rhinitis: Secondary | ICD-10-CM | POA: Diagnosis not present

## 2015-10-31 DIAGNOSIS — J3089 Other allergic rhinitis: Secondary | ICD-10-CM | POA: Diagnosis not present

## 2015-11-05 DIAGNOSIS — J3089 Other allergic rhinitis: Secondary | ICD-10-CM | POA: Diagnosis not present

## 2015-11-11 NOTE — Patient Instructions (Signed)
Your procedure is scheduled on:  Tuesday, November 18, 2015  Enter through the Main Entrance of [facility] at:  6:00 AM  Pick up the phone at the desk and dial [phone].  Call this number if you have problems the morning of surgery: [phone].  Remember: Do NOT eat food or drink after:  Midnight Monday  Take these medicines the morning of surgery with a SIP OF WATER:  Atorvastatin, Klor-Con, Prevacid, Losartan  Bring Asthma inhalers day of surgery.  Do NOT wear jewelry (body piercing), metal hair clips/bobby pins, make-up, or nail polish. Do NOT wear lotions, powders, or perfumes.  You may wear deodorant. Do NOT shave for 48 hours prior to surgery. Do NOT bring valuables to the hospital. Contacts, dentures, or bridgework may not be worn into surgery.  Have a responsible adult drive you home and stay with you for 24 hours after your procedure

## 2015-11-12 ENCOUNTER — Encounter (HOSPITAL_COMMUNITY): Payer: Self-pay

## 2015-11-12 ENCOUNTER — Encounter (HOSPITAL_COMMUNITY)
Admission: RE | Admit: 2015-11-12 | Discharge: 2015-11-12 | Disposition: A | Discharge status: Home / Self Care | Payer: Medicare Other | Source: Ambulatory Visit | Attending: Gynecology | Admitting: Gynecology

## 2015-11-12 ENCOUNTER — Other Ambulatory Visit: Payer: Self-pay

## 2015-11-12 DIAGNOSIS — Z01810 Encounter for preprocedural cardiovascular examination: Secondary | ICD-10-CM | POA: Insufficient documentation

## 2015-11-12 DIAGNOSIS — Z01812 Encounter for preprocedural laboratory examination: Secondary | ICD-10-CM | POA: Diagnosis not present

## 2015-11-12 HISTORY — DX: Cardiac murmur, unspecified: R01.1

## 2015-11-12 HISTORY — DX: Cramp and spasm: R25.2

## 2015-11-12 HISTORY — DX: Anesthesia of skin: R20.0

## 2015-11-12 HISTORY — DX: Gastro-esophageal reflux disease without esophagitis: K21.9

## 2015-11-12 HISTORY — DX: Anesthesia of skin: R20.2

## 2015-11-12 HISTORY — DX: Other specified soft tissue disorders: M79.89

## 2015-11-12 LAB — CBC
HEMATOCRIT: 39 % (ref 36.0–46.0)
HEMOGLOBIN: 12.5 g/dL (ref 12.0–15.0)
MCH: 25.9 pg — AB (ref 26.0–34.0)
MCHC: 32.1 g/dL (ref 30.0–36.0)
MCV: 80.9 fL (ref 78.0–100.0)
Platelets: 189 10*3/uL (ref 150–400)
RBC: 4.82 MIL/uL (ref 3.87–5.11)
RDW: 14.3 % (ref 11.5–15.5)
WBC: 8.5 10*3/uL (ref 4.0–10.5)

## 2015-11-12 LAB — BASIC METABOLIC PANEL
ANION GAP: 9 (ref 5–15)
BUN: 13 mg/dL (ref 6–20)
CALCIUM: 9.2 mg/dL (ref 8.9–10.3)
CO2: 27 mmol/L (ref 22–32)
Chloride: 102 mmol/L (ref 101–111)
Creatinine, Ser: 0.88 mg/dL (ref 0.44–1.00)
GFR calc Af Amer: >60 mL/min (ref >60)
GFR calc non Af Amer: >60 mL/min (ref >60)
GLUCOSE: 167 mg/dL — AB (ref 65–99)
Potassium: 3.5 mmol/L (ref 3.5–5.1)
Sodium: 138 mmol/L (ref 135–145)

## 2015-11-13 ENCOUNTER — Encounter: Payer: Self-pay | Admitting: Family Medicine

## 2015-11-13 ENCOUNTER — Ambulatory Visit (INDEPENDENT_AMBULATORY_CARE_PROVIDER_SITE_OTHER): Payer: Medicare Other | Admitting: Family Medicine

## 2015-11-13 VITALS — BP 136/92 | HR 90 | Temp 98.9°F | Ht 66.0 in | Wt 214.7 lb

## 2015-11-13 DIAGNOSIS — R7309 Other abnormal glucose: Secondary | ICD-10-CM | POA: Diagnosis not present

## 2015-11-13 DIAGNOSIS — I10 Essential (primary) hypertension: Secondary | ICD-10-CM | POA: Diagnosis not present

## 2015-11-13 LAB — GLUCOSE, POCT (MANUAL RESULT ENTRY): POC Glucose: 120 mg/dl — AB (ref 70–99)

## 2015-11-13 LAB — POCT GLYCOSYLATED HEMOGLOBIN (HGB A1C): Hemoglobin A1C: 6

## 2015-11-13 NOTE — Progress Notes (Signed)
Pre visit review using our clinic review tool, if applicable. No additional management support is needed unless otherwise documented below in the visit note. 

## 2015-11-13 NOTE — Patient Instructions (Signed)
Please come to the lab for fasting blood work to check your sugar level. Your results will be communicated to you and your PCP so a plan can be established for your care.  Prediabetes Eating Plan Prediabetes--also called impaired glucose tolerance or impaired fasting glucose--is a condition that causes blood sugar (blood glucose) levels to be higher than normal. Following a healthy diet can help to keep prediabetes under control. It can also help to lower the risk of type 2 diabetes and heart disease, which are increased in people who have prediabetes. Along with regular exercise, a healthy diet:  Promotes weight loss.  Helps to control blood sugar levels.  Helps to improve the way that the body uses insulin. WHAT DO I NEED TO KNOW ABOUT THIS EATING PLAN?  Use the glycemic index (GI) to plan your meals. The index tells you how quickly a food will raise your blood sugar. Choose low-GI foods. These foods take a longer time to raise blood sugar.  Pay close attention to the amount of carbohydrates in the food that you eat. Carbohydrates increase blood sugar levels.  Keep track of how many calories you take in. Eating the right amount of calories will help you to achieve a healthy weight. Losing about 7 percent of your starting weight can help to prevent type 2 diabetes.  You may want to follow a Mediterranean diet. This diet includes a lot of vegetables, lean meats or fish, whole grains, fruits, and healthy oils and fats. WHAT FOODS CAN I EAT? Grains Whole grains, such as whole-wheat or whole-grain breads, crackers, cereals, and pasta. Unsweetened oatmeal. Bulgur. Barley. Quinoa. Brown rice. Corn or whole-wheat flour tortillas or taco shells. Vegetables Lettuce. Spinach. Peas. Beets. Cauliflower. Cabbage. Broccoli. Carrots. Tomatoes. Squash. Eggplant. Herbs. Peppers. Onions. Cucumbers. Brussels sprouts. Fruits Berries. Bananas. Apples. Oranges. Grapes. Papaya. Mango. Pomegranate. Kiwi.  Grapefruit. Cherries. Meats and Other Protein Sources Seafood. Lean meats, such as chicken and [place] or lean cuts of pork and beef. Tofu. Eggs. Nuts. Beans. Dairy Low-fat or fat-free dairy products, such as yogurt, cottage cheese, and cheese. Beverages Water. Tea. Coffee. Sugar-free or diet soda. Seltzer water. Milk. Milk alternatives, such as soy or almond milk. Condiments Mustard. Relish. Low-fat, low-sugar ketchup. Low-fat, low-sugar barbecue sauce. Low-fat or fat-free mayonnaise. Sweets and Desserts Sugar-free or low-fat pudding. Sugar-free or low-fat ice cream and other frozen treats. Fats and Oils Avocado. Walnuts. Olive oil. The items listed above may not be a complete list of recommended foods or beverages. Contact your dietitian for more options.  WHAT FOODS ARE NOT RECOMMENDED? Grains Refined white flour and flour products, such as bread, pasta, snack foods, and cereals. Beverages Sweetened drinks, such as sweet iced tea and soda. Sweets and Desserts Baked goods, such as cake, cupcakes, pastries, cookies, and cheesecake. The items listed above may not be a complete list of foods and beverages to avoid. Contact your dietitian for more information.   This information is not intended to replace advice given to you by your health care provider. Make sure you discuss any questions you have with your health care provider.   Document Released: 09/24/2014 Document Reviewed: 09/24/2014 Elsevier Interactive Patient Education [employer].

## 2015-11-13 NOTE — Progress Notes (Signed)
Subjective:    Patient ID: [place], female    DOB: 09-01-1960, 55 y.o.   MRN: [id number]  HPI  K[place]in [place] is a 55 year old female who presents today for evaluation of a recent elevated glucose that was noted during a preop visit for an upcoming wide local excision of a high grade vulvar intraepithelial neoplasia (VIN-III/CIS) in an outpatient setting one day ago. I am seeing her in place of her PCP who is unavailable today. She reports having blood work yesterday which noted a glucose level of 167 and her gynecologist requested that she be seen by her PCP prior to surgery. This result was not fasting and she reports eating a  bagel with raisins, cream cheese, and  coffee with sweetener from [employer] prior to this testing. She does not have a history of diabetes and denies associated symptoms polyphagia, polyuria, and polydipsia. She is slightly obese and denies exercise or healthy dietary choices. No additional associated symptoms identified.  Lab Results  Component Value Date   HGBA1C 6.0 11/13/2015    HTN: Retake of her BP is 136/92. She denies chest pain, palpitations, SOB, dyspnea, edema, headaches, nosebleeds, numbness, and tingling. She does not monitor her blood pressure at home.  BP Readings from Last 3 Encounters:  11/13/15 136/92  11/12/15 127/79  10/07/15 126/80   Review of Systems  Constitutional: Negative for fever, chills and fatigue.  Eyes: Negative for visual disturbance.  Respiratory: Negative for cough and shortness of breath.   Cardiovascular: Negative for chest pain, palpitations and leg swelling.  Gastrointestinal: Negative for nausea, vomiting, abdominal pain, diarrhea and constipation.  Endocrine: Negative for polydipsia, polyphagia and polyuria.  Genitourinary: Negative for dysuria, urgency, frequency, hematuria and flank pain.  Musculoskeletal: Negative for myalgias and arthralgias.  Skin: Negative for rash.  Neurological: Negative for dizziness,  light-headedness, numbness and headaches.  Hematological: Does not bruise/bleed easily.  Psychiatric/Behavioral:       Denies depressed or anxious mood   Past Medical History  Diagnosis Date  . Allergy   . Anxiety   . Hypertension   . Lichen sclerosus et atrophicus   . 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 Postmenopausal HRT (hormone replacement therapy) - followed by Dr. Toney Rakes in gyn 07/11/2012  . Asthma   . Uterine cancer ([place]) 1991  . Depression   . High cholesterol   . UTI (urinary tract infection)   . VIN III (vulvar intraepithelial neoplasia III)   . Heart murmur   . Numbness and tingling in hands   . Swelling of both hands   . GERD (gastroesophageal reflux disease)   . Leg cramps      Social History   Social History  . Marital Status: Widowed    Spouse Name: N/A  . Number of Children: N/A  . Years of Education: N/A   Occupational History  . Not on file.   Social History Main Topics  . Smoking status: Current Some Day Smoker -- 0.10 packs/day for 20 years    Types: Cigarettes  . Smokeless tobacco: Never Used  . Alcohol Use: 0.0 oz/week    0 Standard drinks or equivalent per week     Comment: rare  . Drug Use: No  . Sexual Activity: Yes    Birth Control/ Protection: Condom, Surgical   Other Topics Concern  . Not on file   Social History Narrative   Work or School: retired [employer]  Home Situation: lives alone       Spiritual Beliefs: Christain            Exercise: Has no motivation. Enjoys walking   Diet: Tries to watch what she eats. Does not eat a lot of processed foods or fast food.     Past Surgical History  Procedure Laterality Date  . Eye surgery      "Lazy Muscle"  . Spine surgery      L5-s1  . Vaginal hysterectomy    . Wisdom tooth extraction      Family History  Problem Relation Age of Onset  . Cancer Mother     LUNG   . Breast cancer Mother     Died    No Known  Allergies  Current Outpatient Prescriptions on File Prior to Visit  Medication Sig Dispense Refill  . albuterol (PROVENTIL HFA;VENTOLIN HFA) 108 (90 BASE) MCG/ACT inhaler Inhale 2 puffs into the lungs every 6 (six) hours as needed.    Marland Kitchen aspirin 81 MG tablet Take 81 mg by mouth daily.    Marland Kitchen atorvastatin (LIPITOR) 20 MG tablet TAKE 1 TABLET BY MOUTH EVERY DAY 90 tablet 3  . cholecalciferol (VITAMIN D) 1000 UNITS tablet Take 1,000 Units by mouth daily.    . clobetasol cream (TEMOVATE) 0.05 % Apply twice a day for one week then twice a week or once a week thereafter. 30 g 2  . cyanocobalamin 100 MCG tablet Take 500 mcg by mouth daily. Reported on 08/07/2015    . docusate sodium (COLACE) 50 MG capsule Take by mouth as needed for mild constipation or moderate constipation. Over the counter Equate stool softener    . estradiol (ESTRACE) 1 MG tablet Take 1 tablet (1 mg total) by mouth daily. 90 tablet 4  . fluticasone (FLONASE) 50 MCG/ACT nasal spray Place 2 sprays into both nostrils daily. 16 g 6  . Fluticasone-Salmeterol (ADVAIR) 250-50 MCG/DOSE AEPB Inhale 1 puff into the lungs as needed.    Marland Kitchen KLOR-CON M20 20 MEQ tablet TAKE 1 TABLET BY MOUTH EVERY DAY 30 tablet 5  . lansoprazole (PREVACID) 30 MG capsule TAKE 1 CAPSULE BY MOUTH ONCE DAILY 90 capsule 3  . levocetirizine (XYZAL) 5 MG tablet Take 5 mg by mouth every evening.     Marland Kitchen losartan-hydrochlorothiazide (HYZAAR) 50-12.5 MG per tablet TAKE 1 TABLET BY MOUTH DAILY. 30 tablet 0  . Melatonin 5 MG TBDP Take by mouth.    . montelukast (SINGULAIR) 10 MG tablet Take 10 mg by mouth at bedtime.    Marland Kitchen oxyCODONE-acetaminophen (PERCOCET) 5-325 MG tablet Take 1 tablet by mouth every 4 (four) hours as needed for severe pain. 30 tablet 0   No current facility-administered medications on file prior to visit.    BP 136/92 mmHg  Pulse 90  Temp(Src) 98.9 F (37.2 C) (Oral)  Ht 5\' 6"  (1.676 m)  Wt 214 lb 11.2 oz (97.387 kg)  BMI 34.67 kg/m2  SpO2 97%       Objective:   Physical Exam  Constitutional: She is oriented to person, place, and time. She appears well-developed and well-nourished.  HENT:  Right Ear: Tympanic membrane normal.  Left Ear: Tympanic membrane normal.  Mouth/Throat: Mucous membranes are normal. No posterior oropharyngeal edema or posterior oropharyngeal erythema.  Eyes: Pupils are equal, round, and reactive to light. No scleral icterus.  Neck: Neck supple.  Cardiovascular: Normal rate and regular rhythm.   Pulmonary/Chest: Effort normal and breath sounds normal. She has no  wheezes.  Abdominal: Soft. Bowel sounds are normal. There is no tenderness.  Musculoskeletal: She exhibits no edema.  Lymphadenopathy:    She has no cervical adenopathy.  Neurological: She is alert and oriented to person, place, and time. Coordination normal.  Skin: Skin is warm and dry. No rash noted.  Psychiatric: She has a normal mood and affect. Her behavior is normal. Judgment and thought content normal.       Assessment & Plan:   1. Elevated glucose level POC A1C is 6.0. POC Glucose is 120. Fasting labs will be obtained with recommendation to follow up with PCP to address dietary and lifestyle factors. Discussed prediabetes and dietary changes to decrease her blood sugar levels. Written information for an eating plan was provided. - Basic metabolic panel; Future - POCT glycosylated hemoglobin (Hb A1C) - POCT glucose (manual entry)    2. Essential hypertension Advised patient to monitor her BP at home with parameters provided to report.   Lab results will be reviewed before preop clearance can be determined. Advised patient to follow up with her PCP to address dietary and lifestyle choices and monitoring of A1C in 3 months with dietary changes. Further advised her to monitor her salt intake.  Delano Metz, FNP-C

## 2015-11-14 ENCOUNTER — Other Ambulatory Visit (INDEPENDENT_AMBULATORY_CARE_PROVIDER_SITE_OTHER): Payer: Medicare Other

## 2015-11-14 DIAGNOSIS — R7309 Other abnormal glucose: Secondary | ICD-10-CM | POA: Diagnosis not present

## 2015-11-14 DIAGNOSIS — J3089 Other allergic rhinitis: Secondary | ICD-10-CM | POA: Diagnosis not present

## 2015-11-14 LAB — BASIC METABOLIC PANEL
BUN: 10 mg/dL (ref 6–23)
CHLORIDE: 104 meq/L (ref 96–112)
CO2: 29 mEq/L (ref 19–32)
Calcium: 9 mg/dL (ref 8.4–10.5)
Creatinine, Ser: 0.81 mg/dL (ref 0.40–1.20)
GFR: 94.48 mL/min (ref >60.00)
GLUCOSE: 99 mg/dL (ref 70–99)
POTASSIUM: 3.8 meq/L (ref 3.5–5.1)
Sodium: 140 mEq/L (ref 135–145)

## 2015-11-17 DIAGNOSIS — J3089 Other allergic rhinitis: Secondary | ICD-10-CM | POA: Diagnosis not present

## 2015-11-17 MED ORDER — DEXTROSE 5 % IV SOLN
2.0000 g | INTRAVENOUS | Status: AC
  Administered 2015-11-18: 2 g via INTRAVENOUS
  Filled 2015-11-17: qty 2

## 2015-11-17 NOTE — H&P (Signed)
Kristin Merritt is an 55 y.o. 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 Patient was seen for the first time as a new patient back in 2014 and then once again in 2015. She had moved to [facility] from [facility]. Patient had informed me that prior to that several years ago she had been living in [place] and had a vulvar biopsy which demonstrated VIN 3 for which I reviewed the report but patient did not return back for more definitive treatment since she had moved to [facility] she also been treated in the past for lichen sclerosus. She states that she had a transvaginal hysterectomy for early uterine cancer several years ago and never received any chemotherapy or radiation therapy. She reports her Pap smears since then have been normal we did 1 here a few months ago which was normal also. I was finally able to review the report that she brought with her from [place] whereby they reported the following that was dated 03/09/2007 from her vulvar biopsy:  Specimen source: A. Vulvar biopsy posterior fourchette, 6:00 B. Vulvar biopsy posterior fourchette 3:00  Diagnoses: A: vulvar posterior fourchette at 6:00 biopsy: Severe dysplasia VIN 3 B. 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 A key punch biopsy instrument was utilized to obtain a representative sample of this area which was submitted for histological evaluation.  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  Patient will be scheduled to undergo wide  local excision in an outpatient setting in the next few weeks.  Pertinent Gynecological History: Menses: post-menopausal Bleeding: Prior hysterectomy postmenopausal Contraception: post menopausal status DES exposure: unknown Blood transfusions: none Sexually transmitted diseases: no past history Previous GYN Procedures: Transvaginal hysterectomy, one vaginal delivery, vulvar biopsies  Last mammogram: normal Date: 2017 Last pap: normal Date: 2017 OB History: G 1, P 1  Menstrual History: Menarche age: 34 No LMP recorded. Patient has had a hysterectomy.    Past Medical History  Diagnosis Date  . Allergy   . Anxiety   . Hypertension   . Lichen sclerosus et atrophicus   . 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 Postmenopausal HRT (hormone replacement therapy) - followed by Dr. Toney Rakes in gyn 07/11/2012  . Asthma   . Uterine cancer ([place]) 1991  . Depression   . High cholesterol   . UTI (urinary tract infection)   . VIN III (vulvar intraepithelial neoplasia III)     Past Surgical History  Procedure Laterality Date  . Eye surgery      "Lazy Muscle"  . Spine surgery      L5-s1  . Vaginal hysterectomy      Family History  Problem Relation Age of Onset  . Cancer Mother     LUNG   . Breast cancer Mother     Died    Social History:  reports that she quit smoking about 3 years ago. Her smoking use included Cigarettes. She has a 20 pack-year smoking history. She has never used smokeless tobacco. She reports that she does not drink alcohol or use illicit drugs.  Allergies: No Known Allergies   (Not in a hospital admission)  REVIEW OF SYSTEMS: A ROS was performed and pertinent positives and negatives are included in the history. GENERAL: No fevers or chills. HEENT: No change in vision, no earache, sore throat or sinus congestion. NECK: No pain or  stiffness. CARDIOVASCULAR: No chest pain or pressure. No palpitations. PULMONARY: No shortness of breath, cough or wheeze. GASTROINTESTINAL: No abdominal pain, nausea, vomiting or diarrhea, melena or bright red blood per rectum. GENITOURINARY: No urinary frequency, urgency, hesitancy or dysuria. MUSCULOSKELETAL: No joint or muscle pain, no back pain, no recent trauma. DERMATOLOGIC: No rash, no itching, no lesions. ENDOCRINE: No polyuria, polydipsia, no heat or cold intolerance. No recent change in weight. HEMATOLOGICAL: No anemia or easy bruising or bleeding. NEUROLOGIC: No headache, seizures, numbness, tingling or weakness. PSYCHIATRIC: No depression, no loss of interest in normal activity or change in sleep pattern.     Blood pressure 126/80, height 5\' 6"  (1.676 m), weight 210 lb (95.255 kg).  Physical Exam:  HEENT:unremarkable Neck:Supple, midline, no thyroid megaly, no carotid bruits Lungs: Clear to auscultation no rhonchi's or wheezes Heart:Regular rate and rhythm, no murmurs or gallops Breast Exam: Examined February 2017 were normal Abdomen: Soft nontender no rebound or guarding Pelvic:BUS*described above Vagina: No vaginal lesions vaginal cuff intact Cervix: Absent Uterus: Absent Adnexa: No palpable masses or tenderness Extremities: No cords, no edema Rectal: Not examined  Assessment/Plan: 55 year old patient with history of VIN 3 diagnosed in [place] in 2008 with no follow-up. Colposcopic directed biopsy confirmed VIN 3. Patient will be scheduled to undergo wide local excision of the area of the fourchette in an outpatient setting. The following risks benefits and pros and cons of the upcoming surgery were discussed as follows: Patient was counseled as to the risk of surgery to include the following:  1. Infection (prohylactic antibiotics will be administered)  2. DVT/Pulmonary Embolism (prophylactic pneumo compression stockings will be used)  3.Trauma  to internal organs requiring additional surgical procedure to repair any injury to   Internal organs requiring perhaps additional hospitalization days.  4.Hemmorhage requiring transfusion and blood products which carry risks such as  anaphylactic reaction, hepatitis and AIDS  Patient had received literature information on the procedure scheduled and all her questions were answered and fully accepts all risk.   [facility] HMD8:38 AMTD

## 2015-11-17 NOTE — Anesthesia Preprocedure Evaluation (Signed)
Anesthesia Evaluation  Patient identified by MRN, date of birth, ID band Patient awake    Reviewed: Allergy & Precautions, H&P , NPO status , Patient's Chart, lab work & pertinent test results  Airway Mallampati: II  TM Distance: >3 FB Neck ROM: full    Dental no notable dental hx. (+) Dental Advisory Given, Teeth Intact   Pulmonary asthma , Current Smoker,    Pulmonary exam normal breath sounds clear to auscultation       Cardiovascular Exercise Tolerance: Good hypertension, Pt. on medications Normal cardiovascular exam Rhythm:regular Rate:Normal     Neuro/Psych negative neurological ROS  negative psych ROS   GI/Hepatic negative GI ROS, Neg liver ROS,   Endo/Other  negative endocrine ROS  Renal/GU negative Renal ROS  negative genitourinary   Musculoskeletal   Abdominal   Peds  Hematology negative hematology ROS (+)   Anesthesia Other Findings   Reproductive/Obstetrics negative OB ROS                             Anesthesia Physical Anesthesia Plan  ASA: II  Anesthesia Plan: General   Post-op Pain Management:    Induction: Intravenous  Airway Management Planned: LMA  Additional Equipment:   Intra-op Plan:   Post-operative Plan:   Informed Consent: I have reviewed the patients History and Physical, chart, labs and discussed the procedure including the risks, benefits and alternatives for the proposed anesthesia with the patient or authorized representative who has indicated his/her understanding and acceptance.   Dental Advisory Given  Plan Discussed with: CRNA  Anesthesia Plan Comments:         Anesthesia Quick Evaluation

## 2015-11-18 ENCOUNTER — Ambulatory Visit (HOSPITAL_COMMUNITY)
Admission: RE | Admit: 2015-11-18 | Discharge: 2015-11-18 | Disposition: A | Discharge status: Home / Self Care | Payer: Medicare Other | Source: Ambulatory Visit | Attending: Gynecology | Admitting: Gynecology

## 2015-11-18 ENCOUNTER — Encounter (HOSPITAL_COMMUNITY)
Admission: RE | Disposition: A | Discharge status: Home / Self Care | Payer: Self-pay | Source: Ambulatory Visit | Attending: Gynecology

## 2015-11-18 ENCOUNTER — Encounter (HOSPITAL_COMMUNITY): Payer: Self-pay | Admitting: *Deleted

## 2015-11-18 ENCOUNTER — Ambulatory Visit (HOSPITAL_COMMUNITY): Payer: Medicare Other | Admitting: Anesthesiology

## 2015-11-18 DIAGNOSIS — K219 Gastro-esophageal reflux disease without esophagitis: Secondary | ICD-10-CM | POA: Diagnosis not present

## 2015-11-18 DIAGNOSIS — J45909 Unspecified asthma, uncomplicated: Secondary | ICD-10-CM | POA: Diagnosis not present

## 2015-11-18 DIAGNOSIS — D071 Carcinoma in situ of vulva: Secondary | ICD-10-CM | POA: Insufficient documentation

## 2015-11-18 DIAGNOSIS — Z8542 Personal history of malignant neoplasm of other parts of uterus: Secondary | ICD-10-CM | POA: Insufficient documentation

## 2015-11-18 DIAGNOSIS — Z87440 Personal history of urinary (tract) infections: Secondary | ICD-10-CM | POA: Diagnosis not present

## 2015-11-18 DIAGNOSIS — F329 Major depressive disorder, single episode, unspecified: Secondary | ICD-10-CM | POA: Insufficient documentation

## 2015-11-18 DIAGNOSIS — E7800 Pure hypercholesterolemia, unspecified: Secondary | ICD-10-CM | POA: Insufficient documentation

## 2015-11-18 DIAGNOSIS — I10 Essential (primary) hypertension: Secondary | ICD-10-CM | POA: Insufficient documentation

## 2015-11-18 DIAGNOSIS — D485 Neoplasm of uncertain behavior of skin: Secondary | ICD-10-CM | POA: Diagnosis not present

## 2015-11-18 DIAGNOSIS — Z87891 Personal history of nicotine dependence: Secondary | ICD-10-CM | POA: Diagnosis not present

## 2015-11-18 DIAGNOSIS — Z90710 Acquired absence of both cervix and uterus: Secondary | ICD-10-CM | POA: Diagnosis not present

## 2015-11-18 DIAGNOSIS — L900 Lichen sclerosus et atrophicus: Secondary | ICD-10-CM | POA: Diagnosis not present

## 2015-11-18 DIAGNOSIS — F17200 Nicotine dependence, unspecified, uncomplicated: Secondary | ICD-10-CM | POA: Insufficient documentation

## 2015-11-18 DIAGNOSIS — Z9889 Other specified postprocedural states: Secondary | ICD-10-CM

## 2015-11-18 DIAGNOSIS — D239 Other benign neoplasm of skin, unspecified: Secondary | ICD-10-CM | POA: Diagnosis not present

## 2015-11-18 DIAGNOSIS — L819 Disorder of pigmentation, unspecified: Secondary | ICD-10-CM | POA: Diagnosis not present

## 2015-11-18 HISTORY — PX: VULVA /PERINEUM BIOPSY: SHX319

## 2015-11-18 SURGERY — BIOPSY, VULVA
Anesthesia: General

## 2015-11-18 MED ORDER — EPHEDRINE SULFATE 50 MG/ML IJ SOLN
INTRAMUSCULAR | Status: DC | PRN
  Administered 2015-11-18 (×5): 5 mg via INTRAVENOUS

## 2015-11-18 MED ORDER — IODINE STRONG (LUGOLS) 5 % PO SOLN
ORAL | Status: AC
  Filled 2015-11-18: qty 1

## 2015-11-18 MED ORDER — KETOROLAC TROMETHAMINE 30 MG/ML IJ SOLN
INTRAMUSCULAR | Status: AC
  Filled 2015-11-18: qty 1

## 2015-11-18 MED ORDER — LACTATED RINGERS IV SOLN
INTRAVENOUS | Status: DC
  Administered 2015-11-18 (×2): via INTRAVENOUS

## 2015-11-18 MED ORDER — SILVER SULFADIAZINE 1 % EX CREA
TOPICAL_CREAM | CUTANEOUS | Status: AC
  Filled 2015-11-18: qty 85

## 2015-11-18 MED ORDER — FENTANYL CITRATE (PF) 100 MCG/2ML IJ SOLN
INTRAMUSCULAR | Status: AC
  Filled 2015-11-18: qty 2

## 2015-11-18 MED ORDER — DEXAMETHASONE SODIUM PHOSPHATE 4 MG/ML IJ SOLN
INTRAMUSCULAR | Status: AC
  Filled 2015-11-18: qty 1

## 2015-11-18 MED ORDER — ONDANSETRON HCL 4 MG/2ML IJ SOLN
INTRAMUSCULAR | Status: DC | PRN
  Administered 2015-11-18: 4 mg via INTRAVENOUS

## 2015-11-18 MED ORDER — POVIDONE-IODINE 10 % EX OINT
TOPICAL_OINTMENT | CUTANEOUS | Status: AC
  Filled 2015-11-18: qty 28.35

## 2015-11-18 MED ORDER — LACTATED RINGERS IV SOLN: INTRAVENOUS | Status: DC

## 2015-11-18 MED ORDER — MIDAZOLAM HCL 2 MG/2ML IJ SOLN
INTRAMUSCULAR | Status: AC
  Filled 2015-11-18: qty 2

## 2015-11-18 MED ORDER — ONDANSETRON HCL 4 MG/2ML IJ SOLN
INTRAMUSCULAR | Status: AC
  Filled 2015-11-18: qty 2

## 2015-11-18 MED ORDER — LIDOCAINE HCL (CARDIAC) 20 MG/ML IV SOLN
INTRAVENOUS | Status: DC | PRN
  Administered 2015-11-18: 80 mg via INTRAVENOUS

## 2015-11-18 MED ORDER — FENTANYL CITRATE (PF) 100 MCG/2ML IJ SOLN: 25.0000 ug | INTRAMUSCULAR | Status: DC | PRN

## 2015-11-18 MED ORDER — LIDOCAINE HCL (CARDIAC) 20 MG/ML IV SOLN
INTRAVENOUS | Status: AC
  Filled 2015-11-18: qty 5

## 2015-11-18 MED ORDER — MIDAZOLAM HCL 2 MG/2ML IJ SOLN
INTRAMUSCULAR | Status: DC | PRN
  Administered 2015-11-18: 2 mg via INTRAVENOUS

## 2015-11-18 MED ORDER — DEXAMETHASONE SODIUM PHOSPHATE 10 MG/ML IJ SOLN
INTRAMUSCULAR | Status: DC | PRN
  Administered 2015-11-18: 4 mg via INTRAVENOUS

## 2015-11-18 MED ORDER — IODINE STRONG (LUGOLS) 5 % PO SOLN
ORAL | Status: DC | PRN
  Administered 2015-11-18: 0.2 mL via ORAL

## 2015-11-18 MED ORDER — ACETIC ACID 5 % SOLN
Status: AC
  Filled 2015-11-18: qty 500

## 2015-11-18 MED ORDER — LIDOCAINE HCL 1 % IJ SOLN
INTRAMUSCULAR | Status: AC
  Filled 2015-11-18: qty 20

## 2015-11-18 MED ORDER — SCOPOLAMINE 1 MG/3DAYS TD PT72
1.0000 | MEDICATED_PATCH | Freq: Once | TRANSDERMAL | Status: DC
  Administered 2015-11-18: 1.5 mg via TRANSDERMAL

## 2015-11-18 MED ORDER — KETOROLAC TROMETHAMINE 30 MG/ML IJ SOLN
INTRAMUSCULAR | Status: DC | PRN
  Administered 2015-11-18: 30 mg via INTRAVENOUS

## 2015-11-18 MED ORDER — EPHEDRINE 5 MG/ML INJ
INTRAVENOUS | Status: AC
  Filled 2015-11-18: qty 10

## 2015-11-18 MED ORDER — LIDOCAINE HCL 1 % IJ SOLN
INTRAMUSCULAR | Status: DC | PRN
  Administered 2015-11-18: 10 mL

## 2015-11-18 MED ORDER — FENTANYL CITRATE (PF) 100 MCG/2ML IJ SOLN
INTRAMUSCULAR | Status: DC | PRN
  Administered 2015-11-18: 100 ug via INTRAVENOUS
  Administered 2015-11-18 (×2): 50 ug via INTRAVENOUS

## 2015-11-18 MED ORDER — PROPOFOL 10 MG/ML IV BOLUS
INTRAVENOUS | Status: DC | PRN
  Administered 2015-11-18: 200 mg via INTRAVENOUS

## 2015-11-18 MED ORDER — SCOPOLAMINE 1 MG/3DAYS TD PT72
MEDICATED_PATCH | TRANSDERMAL | Status: AC
  Administered 2015-11-18: 1.5 mg via TRANSDERMAL
  Filled 2015-11-18: qty 1

## 2015-11-18 MED ORDER — PROPOFOL 10 MG/ML IV BOLUS
INTRAVENOUS | Status: AC
  Filled 2015-11-18: qty 20

## 2015-11-18 SURGICAL SUPPLY — 35 items
APPLICATOR COTTON TIP 6IN STRL (MISCELLANEOUS) IMPLANT
BLADE SURG 15 STRL LF C SS BP (BLADE) ×1 IMPLANT
BLADE SURG 15 STRL SS (BLADE) ×1
CLOTH BEACON ORANGE TIMEOUT ST (SAFETY) ×2 IMPLANT
CONTAINER PREFILL 10% NBF 15ML (MISCELLANEOUS) ×2 IMPLANT
COUNTER NEEDLE 1200 MAGNETIC (NEEDLE) IMPLANT
ELECT BALL LEEP 3MM BLK (ELECTRODE) ×2 IMPLANT
GAUZE SPONGE 4X4 16PLY XRAY LF (GAUZE/BANDAGES/DRESSINGS) ×2 IMPLANT
GLOVE BIOGEL M 6.5 STRL (GLOVE) ×2 IMPLANT
GLOVE BIOGEL PI IND STRL 7.0 (GLOVE) ×2 IMPLANT
GLOVE BIOGEL PI IND STRL 7.5 (GLOVE) ×1 IMPLANT
GLOVE BIOGEL PI IND STRL 8 (GLOVE) ×1 IMPLANT
GLOVE BIOGEL PI INDICATOR 7.0 (GLOVE) ×2
GLOVE BIOGEL PI INDICATOR 7.5 (GLOVE) ×1
GLOVE BIOGEL PI INDICATOR 8 (GLOVE) ×1
GLOVE ECLIPSE 7.5 STRL STRAW (GLOVE) ×4 IMPLANT
GOWN STRL REUS W/TWL LRG LVL3 (GOWN DISPOSABLE) ×4 IMPLANT
HOSE NS SMOKE EVAC 7/8 X6 (MISCELLANEOUS) IMPLANT
NEEDLE HYPO 22GX1.5 SAFETY (NEEDLE) ×2 IMPLANT
NS IRRIG 1000ML POUR BTL (IV SOLUTION) ×2 IMPLANT
PACK VAGINAL MINOR WOMEN LF (CUSTOM PROCEDURE TRAY) ×2 IMPLANT
PAD OB MATERNITY 4.3X12.25 (PERSONAL CARE ITEMS) ×2 IMPLANT
PAD PREP 24X48 CUFFED NSTRL (MISCELLANEOUS) ×2 IMPLANT
REDUCER FITTING SMOKE EVAC (MISCELLANEOUS) IMPLANT
SCOPETTES 8  STERILE (MISCELLANEOUS) ×1
SCOPETTES 8 STERILE (MISCELLANEOUS) ×1 IMPLANT
SUT VIC AB 0 CT1 27 (SUTURE) ×3
SUT VIC AB 0 CT1 27XBRD ANBCTR (SUTURE) ×3 IMPLANT
SUT VIC AB 0 CT3 27 (SUTURE) ×2 IMPLANT
SUT VIC AB 3-0 CT1 27 (SUTURE) ×2
SUT VIC AB 3-0 CT1 TAPERPNT 27 (SUTURE) ×2 IMPLANT
SUT VIC AB 4-0 P-3 18XBRD (SUTURE) ×2 IMPLANT
SUT VIC AB 4-0 P3 18 (SUTURE) ×2
TOWEL OR 17X24 6PK STRL BLUE (TOWEL DISPOSABLE) ×4 IMPLANT
WATER STERILE IRR 1000ML POUR (IV SOLUTION) ×2 IMPLANT

## 2015-11-18 NOTE — Anesthesia Postprocedure Evaluation (Signed)
Anesthesia Post Note  Patient: Kristin Merritt  Procedure(s) Performed: Procedure(s) (LRB): WIDE LOCAL EXCISION OF VULVA and MEDIAL LEFT THIGH LESION  (N/A)  Patient location during evaluation: PACU Anesthesia Type: General Level of consciousness: awake and alert Pain management: pain level controlled Vital Signs Assessment: post-procedure vital signs reviewed and stable Respiratory status: spontaneous breathing, nonlabored ventilation, respiratory function stable and patient connected to nasal cannula oxygen Cardiovascular status: blood pressure returned to baseline and stable Postop Assessment: no signs of nausea or vomiting Anesthetic complications: no     Last Vitals:  Filed Vitals:   11/18/15 0930 11/18/15 0939  BP: 119/73   Pulse: 81 83  Temp:  36.7 C  Resp: 11 16    Last Pain: There were no vitals filed for this visit. Pain Goal: Patients Stated Pain Goal: 3 (11/18/15 [id number])               [place] Souter L

## 2015-11-18 NOTE — Discharge Instructions (Signed)

## 2015-11-18 NOTE — Anesthesia Procedure Notes (Signed)
Procedure Name: LMA Insertion Date/Time: 11/18/2015 7:37 AM Performed by: Jonna Munro Pre-anesthesia Checklist: Patient identified, Emergency Drugs available, Suction available, Timeout performed and Patient being monitored Patient Re-evaluated:Patient Re-evaluated prior to inductionOxygen Delivery Method: Circle system utilized Preoxygenation: Pre-oxygenation with 100% oxygen Intubation Type: IV induction LMA: LMA inserted LMA Size: 4.0 Number of attempts: 1 Placement Confirmation: positive ETCO2 and breath sounds checked- equal and bilateral Tube secured with: Tape Dental Injury: Teeth and Oropharynx as per pre-operative assessment

## 2015-11-18 NOTE — Op Note (Signed)
   Operative Note  11/18/2015  8:55 AM  PATIENT:  Kristin Merritt  55 y.o. female  PRE-OPERATIVE DIAGNOSIS:  vulvar intraepithelial neoplasia VIN III POST-OPERATIVE DIAGNOSIS:  vulvar intraepithelial neoplasia VIN III, left medial thigh hypopigmented lesion  PROCEDURE:  Procedure(s): WIDE LOCAL EXCISION OF VULVA and MEDIAL LEFT THIGH LESION   SURGEON:  Surgeon(s): Terrance Mass, MD  ANESTHESIA:   general , also 1% lidocaine was infiltrated at the area of the fourchette and the left medial thigh prior to excision of lesions  FINDINGS: Raised hyperpigmented areas fourchette. Single isolated medial thigh hypopigmented raised lesion with irregular borders  DESCRIPTION OF OPERATION: The patient was taken to the operating room where she underwent a successful general endotracheal anesthesia. A timeout was undertaken to properly identify the patient and procedure to be performed. Patient received 2 g of Cefotan for prophylaxis. She had PSA stockings for DVT prophylaxis. The legs were then placed in high lithotomy position. Sterile drapes were in place. Lugol's solution was applied to the area of the fourchette and the area was marked with a marking pen and a triangular fashion. Also there was a 1-1/2 cm hyperpigmented raised lesion with irregular borders and the left medial thigh which was also marked with a marking pen to be excised. Lugol's solution had been applied to properly identify the area. A triangular excision of the area of the fourchette incorporating the lesions seen was undertaken. The specimen was removed and placed on a cork board and placed in formaldehyde. The subcutaneous bleeders were Bovie cauterized. The subcutaneous tissue was reapproximated with interrupted sutures of 0 Vicryl suture. The skin edges were reapproximated with interrupted sutures of 0 Vicryl suture as well. The left medial thigh lesion was excised in a elliptical fashion and passed off the operative field and  placed on a cork board as well and placed in formaldehyde for histological evaluation. The subcutaneous tissue was reapproximate with 0 Vicryl suture and the skin edges were reapproximated with interrupted sutures of 3-0 Vicryl suture. Maxitrol and cream was applied to both areas that were excised and Tegaderm was placed on the left medial thigh. I've note both incision sites had previously been infiltrated 1% lidocaine for a total 10 cc. Patient received 4 all 30 mg IV in route to the recovery room. She was extubated and transferred to the recovery room with stable vital signs.   ESTIMATED BLOOD LOSS: Minimal   Intake/Output Summary (Last 24 hours) at 11/18/15 0855 Last data filed at 11/18/15 0841  Gross per 24 hour  Intake   1500 ml  Output    415 ml  Net   1085 ml     BLOOD ADMINISTERED:none   LOCAL MEDICATIONS USED:  XYLOCAINE   SPECIMEN:  Source of Specimen: #1 Wide local excision of the fourchette #2 excision of left medial thigh lesion  DISPOSITION OF SPECIMEN:  PATHOLOGY  COUNTS:  YES  PLAN OF CARE: Transfer to PACU  [facility] HMD8:55 AMTD@

## 2015-11-18 NOTE — Transfer of Care (Signed)
Immediate Anesthesia Transfer of Care Note  Patient: [name]  Procedure(s) Performed: Procedure(s): WIDE LOCAL EXCISION OF VULVA and MEDIAL LEFT THIGH LESION  (N/A)  Patient Location: PACU  Anesthesia Type:General  Level of Consciousness: awake, alert  and oriented  Airway & Oxygen Therapy: Patient Spontanous Breathing and Patient connected to nasal cannula oxygen  Post-op Assessment: Report given to RN and Post -op Vital signs reviewed and stable  Post vital signs: Reviewed and stable  Last Vitals:  Filed Vitals:   11/18/15 0638  BP: 127/77  Pulse: 78  Temp: 36.8 C  Resp: 18    Last Pain: There were no vitals filed for this visit.    Patients Stated Pain Goal: 3 ([id number])  Complications: No apparent anesthesia complications

## 2015-11-18 NOTE — Interval H&P Note (Signed)
History and Physical Interval Note:  11/18/2015 7:18 AM  Kristin Merritt  has presented today for surgery, with the diagnosis of vulvar intraepithelial neoplasia  The various methods of treatment have been discussed with the patient and family. After consideration of risks, benefits and other options for treatment, the patient has consented to  Procedure(s): WIDE LOCAL EXCISION OF VULVA (N/A) as a surgical intervention .  The patient's history has been reviewed, patient examined, no change in status, stable for surgery.  I have reviewed the patient's chart and labs.  Questions were answered to the patient's satisfaction.     Terrance Mass

## 2015-11-19 ENCOUNTER — Telehealth: Payer: Self-pay | Admitting: *Deleted

## 2015-11-19 ENCOUNTER — Encounter (HOSPITAL_COMMUNITY): Payer: Self-pay | Admitting: Gynecology

## 2015-11-19 NOTE — Telephone Encounter (Signed)
Pt informed with the below note. 

## 2015-11-19 NOTE — Telephone Encounter (Signed)
Pt called this am post wide local excision vulva and medial left thigh lesion on 11/18/15. Pt asked if she can start back taking her regular daily medications such as blood pressure pill, potassium, Aspirin. She takes allergy medication and melatonin at night and was going to use the percocet at night as well asked if this was okay.   Also she said on her thigh is a clear bandage that is coming off, okay to remove? Please advise

## 2015-11-19 NOTE — Telephone Encounter (Signed)
I will relay to patient, what about the clear bandage on her thigh can that come off now?

## 2015-11-19 NOTE — Telephone Encounter (Signed)
Take all medicines but not take Melatonin at bedtime if she is taking a percocet

## 2015-11-19 NOTE — Telephone Encounter (Signed)
She can take that off today or tomorrow

## 2015-11-20 ENCOUNTER — Other Ambulatory Visit: Payer: Self-pay | Admitting: Gynecology

## 2015-11-20 ENCOUNTER — Telehealth: Payer: Self-pay

## 2015-11-20 MED ORDER — CLOBETASOL PROPIONATE 0.05 % EX CREA: TOPICAL_CREAM | CUTANEOUS | Status: DC

## 2015-11-20 NOTE — Telephone Encounter (Signed)
Called patient. Gave lab results. Patient verbalized understanding.  3

## 2015-11-20 NOTE — Telephone Encounter (Signed)
-----   Message from Delano Metz, [place] sent at 11/14/2015 11:13 AM EDT ----- Lab work is normal. Please advise patient that she is cleared for surgery on Tuesday. Recommend follow up in 3 months with [facility] for recheck of her A1C.

## 2015-11-21 ENCOUNTER — Telehealth: Payer: Self-pay | Admitting: *Deleted

## 2015-11-21 NOTE — Telephone Encounter (Signed)
-----   Message from Ramond Craver, [place] sent at 11/20/2015 12:24 PM EDT ----- Regarding: referral to Dr. Denman George Per Dr. Moshe Salisbury "Please inform patient that I would like to refer her to the GYN oncologist because her wide local excision of her vagina showed severe dysplasia (VIN-3) suspicious for focal (local) disease microinvasion. Please make an appointment with her with Dr. Denman George GYN oncologist."  Patient has been informed and knows you will be arranging and letting her know. Thanks!

## 2015-11-21 NOTE — Telephone Encounter (Signed)
Appointment on 12/01/15 @ 9:45am with Dr.Rossi, left message for pt to call.

## 2015-11-21 NOTE — Telephone Encounter (Signed)
Pt informed with the below note. 

## 2015-11-24 ENCOUNTER — Telehealth: Payer: Self-pay | Admitting: *Deleted

## 2015-11-24 MED ORDER — TRAMADOL HCL 50 MG PO TABS: 50.0000 mg | ORAL_TABLET | Freq: Four times a day (QID) | ORAL | Status: DC | PRN

## 2015-11-24 NOTE — Telephone Encounter (Signed)
Pt aware Rx called in 

## 2015-11-24 NOTE — Telephone Encounter (Signed)
Pt post wide local excision vulva and medial left thigh lesion on 11/18/15 states the perocet prescribed causes her to itch a lot. Pt said she only takes 1-2 tablet a day if that depends on the pain. Pt asked if something else could be prescribed? Please advise

## 2015-11-24 NOTE — Telephone Encounter (Signed)
Please call in Ultram 50 mg one PO Q 6 hours if after a few days its not strong enough take two (equivalent to 100 mg) every 6 hours as well. Call in #[phone]

## 2015-11-27 DIAGNOSIS — J3089 Other allergic rhinitis: Secondary | ICD-10-CM | POA: Diagnosis not present

## 2015-12-01 ENCOUNTER — Ambulatory Visit: Payer: Medicare Other | Attending: Gynecologic Oncology | Admitting: Gynecologic Oncology

## 2015-12-01 ENCOUNTER — Encounter: Payer: Self-pay | Admitting: Gynecologic Oncology

## 2015-12-01 VITALS — BP 139/89 | HR 102 | Temp 98.4°F | Resp 18 | Ht 66.0 in | Wt 209.3 lb

## 2015-12-01 DIAGNOSIS — I10 Essential (primary) hypertension: Secondary | ICD-10-CM | POA: Insufficient documentation

## 2015-12-01 DIAGNOSIS — J45909 Unspecified asthma, uncomplicated: Secondary | ICD-10-CM | POA: Diagnosis not present

## 2015-12-01 DIAGNOSIS — F329 Major depressive disorder, single episode, unspecified: Secondary | ICD-10-CM | POA: Diagnosis not present

## 2015-12-01 DIAGNOSIS — E7800 Pure hypercholesterolemia, unspecified: Secondary | ICD-10-CM | POA: Diagnosis not present

## 2015-12-01 DIAGNOSIS — Z720 Tobacco use: Secondary | ICD-10-CM | POA: Diagnosis not present

## 2015-12-01 DIAGNOSIS — K219 Gastro-esophageal reflux disease without esophagitis: Secondary | ICD-10-CM | POA: Insufficient documentation

## 2015-12-01 DIAGNOSIS — F419 Anxiety disorder, unspecified: Secondary | ICD-10-CM | POA: Insufficient documentation

## 2015-12-01 DIAGNOSIS — Z888 Allergy status to other drugs, medicaments and biological substances status: Secondary | ICD-10-CM | POA: Insufficient documentation

## 2015-12-01 DIAGNOSIS — Z8542 Personal history of malignant neoplasm of other parts of uterus: Secondary | ICD-10-CM | POA: Insufficient documentation

## 2015-12-01 DIAGNOSIS — D071 Carcinoma in situ of vulva: Secondary | ICD-10-CM

## 2015-12-01 DIAGNOSIS — Z8541 Personal history of malignant neoplasm of cervix uteri: Secondary | ICD-10-CM | POA: Diagnosis not present

## 2015-12-01 DIAGNOSIS — Z79899 Other long term (current) drug therapy: Secondary | ICD-10-CM | POA: Diagnosis not present

## 2015-12-01 DIAGNOSIS — F17210 Nicotine dependence, cigarettes, uncomplicated: Secondary | ICD-10-CM | POA: Insufficient documentation

## 2015-12-01 DIAGNOSIS — Z9889 Other specified postprocedural states: Secondary | ICD-10-CM | POA: Insufficient documentation

## 2015-12-01 NOTE — Patient Instructions (Signed)
We will call you with the pathology results

## 2015-12-01 NOTE — Progress Notes (Signed)
Consult Note: Gyn-Onc  Consult was requested by Dr. Toney Rakes for the evaluation of Kristin Merritt 55 y.o. female  CC:  Chief Complaint  Patient presents with  . VIN III    Assessment/Plan:  [name]  is a 55 y.o.  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  I discussed with [name] that I will need to obtain more definitive information on the pathology regarding margin status and depth of invasion. If the margins are positive or if the invasive component is close to the margin (within 10mm) I would recommend re-excision at the vulva. It is unlikely she will require a radical vulvectomy or lymphadenectomy (unless the invasive component is >39mm deeply invasive).  I discussed she is healing normally for this procedure as it is very common for wound separation in this area.  I discussed that VIN3 and vulvar cancer is caused by tobacco exposure and reinforced the need for smoking cessation.  I discussed there is a 1 in 4 risk for recurrence of this condition in other vulvar tissues and therefore she will require 6 monthly follow-up visits with Dr Toney Rakes to evaluate for new vulvar lesions.  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 The patient has a history of VIN3 and lichen sclerosis diagnosed in 2008 in [place]. She did not seek treatment for this because she had many life changes and pressures at the time. She relocated to [facility] in 2014 at which time she saw Dr Toney Rakes to establish care. At a routine visit he noted an area on her perineal body which she told him she had been treating with a cream (presumably steroid) and he represcribed the same. In 2017 it was persistent and therefore he biopsied the area to determine if it was dysplasia or lichen sclerosis. The pathology returned VIN3. This prompted her to go to the OR with Dr Toney Rakes on 11/18/15 for  a wide local excision of the perinenal body (and left thigh excision). 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 There was no comment regarding margin status or depth of invasion on the pathology report. The pathology comments that there are "2 small foci of squamous cells present at the superficial dermis. It is difficult to determine whether these represent tangential section of the in situ vulva carcinoma or represent associated focal infiltrating squamous cell carcinoma. Multiple deeper sections have been examined."   Since the surgery the patient has experienced wound separation and bleeding.   She reports a history of tobacco use and prior cervical cancer (possibly dysplasia) which was treated with a vaginal hysterectomy.  Current Meds:  Outpatient Encounter Prescriptions as of 12/01/2015  Medication Sig  . albuterol (PROVENTIL HFA;VENTOLIN HFA) 108 (90 BASE) MCG/ACT inhaler Inhale 2 puffs into the lungs every 6 (six) hours as needed.  Marland Kitchen atorvastatin (LIPITOR) 20 MG tablet TAKE 1 TABLET BY MOUTH EVERY DAY  . cholecalciferol (VITAMIN D) 1000 UNITS tablet Take 1,000 Units by mouth daily.  . clobetasol cream (TEMOVATE) 0.05 % S:apply to affected area bid x 1 week then 2 x weekly for 4-6 weeks.  . diazepam (VALIUM) 5 MG tablet Take 5 mg by mouth.  . docusate sodium (COLACE) 50 MG capsule Take by mouth as needed for mild constipation or moderate constipation. Over the counter Equate stool softener  . estradiol (ESTRACE) 1 MG  tablet Take 1 tablet (1 mg total) by mouth daily.  . fluticasone (FLONASE) 50 MCG/ACT nasal spray Place 2 sprays into both nostrils daily.  . Fluticasone-Salmeterol (ADVAIR) 250-50 MCG/DOSE AEPB Inhale 1 puff into the lungs as needed.  Marland Kitchen KLOR-CON M20 20 MEQ tablet TAKE 1 TABLET BY MOUTH EVERY DAY  . lansoprazole (PREVACID) 30 MG capsule TAKE 1 CAPSULE BY MOUTH ONCE DAILY  . levocetirizine (XYZAL) 5 MG tablet  Take 5 mg by mouth every evening.   Marland Kitchen losartan-hydrochlorothiazide (HYZAAR) 50-12.5 MG per tablet TAKE 1 TABLET BY MOUTH DAILY.  . Melatonin 5 MG TBDP Take by mouth.  . montelukast (SINGULAIR) 10 MG tablet Take 10 mg by mouth at bedtime.  . traMADol (ULTRAM) 50 MG tablet Take 1 tablet (50 mg total) by mouth every 6 (six) hours as needed.  . [DISCONTINUED] cyanocobalamin 100 MCG tablet Take 500 mcg by mouth daily. Reported on 12/01/2015  . [DISCONTINUED] oxyCODONE-acetaminophen (PERCOCET) 5-325 MG tablet Take 1 tablet by mouth every 4 (four) hours as needed for severe pain. (Patient not taking: Reported on 12/01/2015)   No facility-administered encounter medications on file as of 12/01/2015.    Allergy:  Allergies  Allergen Reactions  . Percocet [Oxycodone-Acetaminophen] Itching    Social Hx:   Social History   Social History  . Marital Status: Widowed    Spouse Name: N/A  . Number of Children: N/A  . Years of Education: N/A   Occupational History  . Not on file.   Social History Main Topics  . Smoking status: Current Some Day Smoker -- 1.00 packs/day for 20 years    Types: Cigarettes  . Smokeless tobacco: Never Used  . Alcohol Use: 0.0 oz/week    0 Standard drinks or equivalent per week     Comment: rare  . Drug Use: No  . Sexual Activity: Yes    Birth Control/ Protection: Condom, Surgical   Other Topics Concern  . 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 Enjoys walking   Diet: Tries to watch what she eats. Does not eat a lot of processed foods or fast food.     Past Surgical Hx:  Past Surgical History  Procedure Laterality Date  . Eye surgery      "Lazy Muscle"  . Spine surgery      L5-s1  . Vaginal hysterectomy    . Wisdom tooth extraction    . Vulva /perineum biopsy N/A 11/18/2015    Procedure: WIDE LOCAL  EXCISION OF VULVA and MEDIAL LEFT THIGH LESION ;  Surgeon: Terrance Mass, MD;  Location: [facility];  Service: Gynecology;  Laterality: N/A;    Past Medical Hx:  Past Medical History  Diagnosis Date  . Allergy   . Anxiety   . Hypertension   . Lichen sclerosus et atrophicus   . 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 Postmenopausal HRT (hormone replacement therapy) - followed by Dr. Toney Rakes in gyn 07/11/2012  . Asthma   . Uterine cancer ([place]) 1991  . Depression   . High cholesterol   . UTI (urinary tract infection)   . VIN III (vulvar intraepithelial neoplasia III)   . Heart murmur   . Numbness and  tingling in hands   . Swelling of both hands   . GERD (gastroesophageal reflux disease)   . Leg cramps     Past Gynecological History:  Dysplasia/cancer of the cervix, VIN3, lichen sclerosis.  No LMP recorded. Patient has had a hysterectomy.  Family Hx:  Family History  Problem Relation Age of Onset  . Cancer Mother     LUNG   . Breast cancer Mother     Died    Review of Systems:  Constitutional  Feels well,    ENT Normal appearing ears and nares bilaterally Skin/Breast  No rash, sores, jaundice, itching, dryness Cardiovascular  No chest pain, shortness of breath, or edema  Pulmonary  No cough or wheeze.  Gastro Intestinal  No nausea, vomitting, or diarrhoea. No bright red blood per rectum, no abdominal pain, change in bowel movement, or constipation.  Genito Urinary  No frequency, urgency, dysuria, discomfort at incision site Musculo Skeletal  No myalgia, arthralgia, joint swelling or pain  Neurologic  No weakness, numbness, change in gait,  Psychology  No depression, anxiety, insomnia.   Vitals:  Blood pressure 139/89, pulse 102, temperature 98.4 F (36.9 C), temperature source Oral, resp. rate 18, height 5\' 6"  (1.676 m), weight 209 lb 4.8 oz (94.938 kg), SpO2 97 %.  Physical Exam: WD in NAD Neck  Supple NROM, without any  enlargements.  Lymph Node Survey No cervical supraclavicular or inguinal adenopathy Cardiovascular  Pulse normal rate, regularity and rhythm. S1 and S2 normal.  Lungs  Clear to auscultation bilateraly, without wheezes/crackles/rhonchi. Good air movement.  Skin  No rash/lesions/breakdown  Psychiatry  Alert and oriented to person, place, and time  Abdomen  Normoactive bowel sounds, abdomen soft, non-tender and obese without evidence of hernia. Back No CVA tenderness Genito Urinary  Vulva/vagina:No visibible displasia or malignancy remaining. 3cm incision on perineum, midline with separation of the wound bed at the central aspect. Hemorrhoid anteriorally which the incision abuts.  Bladder/urethra:  No lesions or masses, well supported bladder  Vagina: deferred  Cervix: surgically absent  Uterus: surgcially absent   Adnexa: deferred exam  Rectal  Anal hemorrhoid anteriorally Extremities  No bilateral cyanosis, clubbing or edema.   Donaciano Eva, MD  12/01/2015, 10:51 AM

## 2015-12-02 ENCOUNTER — Ambulatory Visit (INDEPENDENT_AMBULATORY_CARE_PROVIDER_SITE_OTHER): Payer: Medicare Other | Admitting: Gynecology

## 2015-12-02 ENCOUNTER — Encounter: Payer: Self-pay | Admitting: Gynecology

## 2015-12-02 VITALS — BP 130/80

## 2015-12-02 DIAGNOSIS — Z09 Encounter for follow-up examination after completed treatment for conditions other than malignant neoplasm: Secondary | ICD-10-CM

## 2015-12-02 DIAGNOSIS — D071 Carcinoma in situ of vulva: Secondary | ICD-10-CM

## 2015-12-02 NOTE — Progress Notes (Signed)
Patient is a 55 year old who presented to the office today for her 3 week postop visit. Patient status post wide local excision of vaginal fourchette area as well as the left medial thigh biopsy. Her history is as follows:  Patient was seen for the first time as a new patient back in 2014 and then once again in 2015. She had moved to [facility] from [facility]. Patient had informed me that prior to that several years ago she had been living in [place] and had a vulvar biopsy which demonstrated VIN 3 for which I reviewed the report but patient did not return back for more definitive treatment since she had moved to [facility] she also been treated in the past for lichen sclerosus. She states that she had a transvaginal hysterectomy for early uterine cancer several years ago and never received any chemotherapy or radiation therapy. She reports her Pap smears since then have been normal we did 1 here a few months ago which was normal also. I was finally able to review the report that she brought with her from [place] whereby they reported the following that was dated 03/09/2007 from her vulvar biopsy:  Specimen source: A. Vulvar biopsy posterior fourchette, 6:00 B. Vulvar biopsy posterior fourchette 3:00  Diagnoses: A: vulvar posterior fourchette at 6:00 biopsy: Severe dysplasia VIN 3 B. 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  She was taken to the operating room on June 27 where she underwent a wide local excision of the fourchette and excision of a left medial thigh lesion and the pathology report demonstrated the following:  Diagnosis 1. 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 Skin , left thigh LICHEN SCLEROSIS Microscopic Comment 1. There are two small foci squamous cells present at superficial dermis. It is difficult to determine whether these represent tangential section of in situ vulva carcinoma or represent associated focal infiltrating squamous cell carcinoma. Multiple deeper sections has been exam.  With these findings the patient had been referred to the GYN oncologist Dr. Denman George who was kind enough to see the patient yesterday in consultation. Her concerns was follows:  "I discussed with Ms Litke that I will need to obtain more definitive information on the pathology regarding margin status and depth of invasion. If the margins are positive or if the invasive component is close to the margin (within 46mm) I would recommend re-excision at the vulva. It is unlikely she will require a radical vulvectomy or lymphadenectomy (unless the invasive component is >45mm deeply invasive)."  She's here for postop visit.  Exam: Left medial thigh suture still present we'll leave sutures for a few more weeks before removing to prevent separation. Fourchette sutures present some of the edges slightly separated and not bleeding.  Assessment/plan: #1 patient 3 weeks status post wide local excision of vulvar fourchette area with pathology report demonstrating vulvar intraepithelial neoplasia 3/squamous cell carcinoma in situ,Basaloid type, suspicious for focal microscopic invasion. Patient will be instructed to do sitz bath once a day. Keep the area clean. She'll return back in 3 weeks and we'll remove the sutures. I'm also wanted to place a phone call to Dr. Denman George to see if she has had a chance to review her slides to  see if she needs reexcision or any further treatment. #2 left medial thigh lichen sclerosis she'll continue to apply clobetasol 2-3 times a week to this area. We'll remove the sutures which she returned back in 3  weeks for postop visit.

## 2015-12-04 DIAGNOSIS — J3089 Other allergic rhinitis: Secondary | ICD-10-CM | POA: Diagnosis not present

## 2015-12-15 DIAGNOSIS — H1045 Other chronic allergic conjunctivitis: Secondary | ICD-10-CM | POA: Diagnosis not present

## 2015-12-15 DIAGNOSIS — J3089 Other allergic rhinitis: Secondary | ICD-10-CM | POA: Diagnosis not present

## 2015-12-15 DIAGNOSIS — R05 Cough: Secondary | ICD-10-CM | POA: Diagnosis not present

## 2015-12-24 ENCOUNTER — Ambulatory Visit: Payer: Medicare Other | Admitting: Gynecology

## 2015-12-26 ENCOUNTER — Other Ambulatory Visit: Payer: Self-pay | Admitting: Adult Health

## 2015-12-26 DIAGNOSIS — J3089 Other allergic rhinitis: Secondary | ICD-10-CM | POA: Diagnosis not present

## 2015-12-26 NOTE — Telephone Encounter (Signed)
Ok to refill 

## 2015-12-26 NOTE — Telephone Encounter (Signed)
Ok to refill for six months  

## 2015-12-29 ENCOUNTER — Encounter: Payer: Self-pay | Admitting: Gynecology

## 2015-12-29 ENCOUNTER — Ambulatory Visit (INDEPENDENT_AMBULATORY_CARE_PROVIDER_SITE_OTHER): Payer: Medicare Other | Admitting: Gynecology

## 2015-12-29 VITALS — BP 134/80

## 2015-12-29 DIAGNOSIS — Z09 Encounter for follow-up examination after completed treatment for conditions other than malignant neoplasm: Secondary | ICD-10-CM

## 2015-12-29 DIAGNOSIS — D071 Carcinoma in situ of vulva: Secondary | ICD-10-CM

## 2015-12-29 DIAGNOSIS — J3089 Other allergic rhinitis: Secondary | ICD-10-CM | POA: Diagnosis not present

## 2015-12-29 NOTE — Progress Notes (Signed)
Patient is a 55 year old who presents to the office for her six-week postop visit.Patient status post wide local excision of vaginal fourchette area as well as the left medial thigh biopsy. Her history is as follows:  Patient was seen for the first time as a new patient back in 2014 and then once again in 2015. She had moved to [facility] from [facility]. Patient had informed me that prior to that several years ago she had been living in [place] and had a vulvar biopsy which demonstrated VIN 3 for which I reviewed the report but patient did not return back for more definitive treatment since she had moved to [facility] she also been treated in the past for lichen sclerosus. She states that she had a transvaginal hysterectomy for early uterine cancer several years ago and never received any chemotherapy or radiation therapy. She reports her Pap smears since then have been normal we did 1 here a few months ago which was normal also. I was finally able to review the report that she brought with her from [place] whereby they reported the following that was dated 03/09/2007 from her vulvar biopsy:  Specimen source: A. Vulvar biopsy posterior fourchette, 6:00 B. Vulvar biopsy posterior fourchette 3:00  Diagnoses: A: vulvar posterior fourchette at 6:00 biopsy: Severe dysplasia VIN 3 B. 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  She was taken to the operating room on June 27 where she underwent a wide local excision of the fourchette and excision of a left medial thigh lesion and the pathology report demonstrated the following:  Diagnosis 1. 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 Skin , left thigh LICHEN SCLEROSIS Microscopic Comment 1. There are two small foci squamous cells present at superficial dermis. It is difficult to determine whether these represent tangential section of in situ vulva carcinoma or represent associated focal infiltrating squamous cell carcinoma. Multiple deeper sections has been exam.  With these findings the patient had been referred to the GYN oncologist Dr. Denman George who was kind enough to see the patient yesterday in consultation. Her concerns was follows:  "I discussed with Ms Walp that I will need to obtain more definitive information on the pathology regarding margin status and depth of invasion. If the margins are positive or if the invasive component is close to the margin (within 100mm) I would recommend re-excision at the vulva. It is unlikely she will require a radical vulvectomy or lymphadenectomy (unless the invasive component is >10mm deeply invasive)."  She's here for postop visit.  Exam: Bartholin urethra Skene was within normal limits Left medial thigh completely healed some scarring. 4 set sutures were removed and not bleeding  Assessment/plan: Patient 6 weeks status post wide local excision of vulvar fourchette area with pathology report demonstrating vulvar intraepithelial neoplasia 3/squamous cell carcinoma in situ,Basaloid type, suspicious for focal microscopic invasion. Patient will be instructed to do sitz bath once a day and apply Neosporin to the area. I have sent a message to the GYN oncologist Dr. Denman George in reference to this patient's case which she has seen before cyst that she was going to review her slides and discuss her case at the tumor Board to see what the next step will be.  I have asked the patient to call me at the end of the week to see a 5 her from her to see what will be our next plan of action.

## 2015-12-30 ENCOUNTER — Ambulatory Visit (INDEPENDENT_AMBULATORY_CARE_PROVIDER_SITE_OTHER): Payer: Medicare Other | Admitting: Adult Health

## 2015-12-30 ENCOUNTER — Encounter: Payer: Self-pay | Admitting: Adult Health

## 2015-12-30 VITALS — BP 138/88 | HR 91 | Temp 98.4°F | Ht 66.0 in | Wt 219.4 lb

## 2015-12-30 DIAGNOSIS — R030 Elevated blood-pressure reading, without diagnosis of hypertension: Secondary | ICD-10-CM | POA: Diagnosis not present

## 2015-12-30 DIAGNOSIS — R739 Hyperglycemia, unspecified: Secondary | ICD-10-CM

## 2015-12-30 DIAGNOSIS — R7309 Other abnormal glucose: Secondary | ICD-10-CM | POA: Diagnosis not present

## 2015-12-30 DIAGNOSIS — IMO0001 Reserved for inherently not codable concepts without codable children: Secondary | ICD-10-CM

## 2015-12-30 LAB — POCT GLYCOSYLATED HEMOGLOBIN (HGB A1C): HEMOGLOBIN A1C: 5.7

## 2015-12-30 NOTE — Progress Notes (Signed)
Pre visit review using our clinic review tool, if applicable. No additional management support is needed unless otherwise documented below in the visit note. 

## 2015-12-30 NOTE — Patient Instructions (Signed)
It was great seeing you today!  Your blood pressures and blood sugars are within normal range.   Work on weight loss through diet and exercise so that we can keep it that way

## 2015-12-30 NOTE — Progress Notes (Signed)
Subjective:    Patient ID: Kristin Merritt, female    DOB: [birth year], 55 y.o.   MRN: [id number]  HPI  55 year old female who presents to the office today for concern about flutuating blood pressures and blood sugars.   She has been monitoring her blood pressure at home and per her logs, have been in the 117 - 130 range with two outliers in the 170's. She does not report any blurred vision or headaches.   She is also concerned because she was told by that her blood sugar was elevated. In June during her pre-op labs her BS was 167. She reports that this was non fasting.   Lab Results  Component Value Date   HGBA1C 6.0 11/13/2015     Review of Systems  Constitutional: Negative.   Respiratory: Negative.   Cardiovascular: Negative.   Gastrointestinal: Negative.   Genitourinary: Negative.   Neurological: Negative.   Hematological: Negative.   All other systems reviewed and are negative.  Past Medical History:  Diagnosis Date  . Allergy   . Anxiety   . Asthma   . Depression   . GERD (gastroesophageal reflux disease)   . Heart murmur   . High cholesterol   . Hypertension   . Leg cramps   . Lichen sclerosus et atrophicus   . Numbness and tingling in hands   . Postmenopausal HRT (hormone replacement therapy) - followed by Dr. Toney Rakes in gyn 07/11/2012  . Swelling of both hands   . Uterine cancer ([place]) 1991  . UTI (urinary tract infection)   . 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 VIN III (vulvar intraepithelial neoplasia III)     Social History   Social History  . Marital status: Widowed    Spouse name: N/A  . Number of children: N/A  . Years of education: N/A   Occupational History  . Not on file.   Social History Main Topics  . Smoking status: Current Some Day Smoker    Packs/day: 1.00    Years: 20.00    Types: Cigarettes  . Smokeless tobacco: Never Used  . Alcohol use 0.0 oz/week     Comment: rare  . Drug use: No  .  Sexual activity: Yes    Birth control/ protection: Condom, Surgical   Other Topics Concern  . 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 Enjoys walking   Diet: Tries to watch what she eats. Does not eat a lot of processed foods or fast food.     Past Surgical History:  Procedure Laterality Date  . EYE SURGERY     "Lazy Muscle"  . SPINE SURGERY     L5-s1  . VAGINAL HYSTERECTOMY    . VULVA /PERINEUM BIOPSY N/A 11/18/2015   Procedure: WIDE LOCAL EXCISION OF VULVA and MEDIAL LEFT THIGH LESION ;  Surgeon: Terrance Mass, MD;  Location: [facility];  Service: Gynecology;  Laterality: N/A;  . WISDOM TOOTH EXTRACTION      Family History  Problem Relation Age of Onset  . Cancer Mother     LUNG   . Breast cancer Mother     Died    Allergies  Allergen Reactions  . Percocet [  Oxycodone-Acetaminophen] Itching    Current Outpatient Prescriptions on File Prior to Visit  Medication Sig Dispense Refill  . albuterol (PROVENTIL HFA;VENTOLIN HFA) 108 (90 BASE) MCG/ACT inhaler Inhale 2 puffs into the lungs every 6 (six) hours as needed.    Marland Kitchen atorvastatin (LIPITOR) 20 MG tablet TAKE 1 TABLET BY MOUTH EVERY DAY 90 tablet 3  . cholecalciferol (VITAMIN D) 1000 UNITS tablet Take 1,000 Units by mouth daily.    . clobetasol cream (TEMOVATE) 0.05 % S:apply to affected area bid x 1 week then 2 x weekly for 4-6 weeks. 30 g 1  . diazepam (VALIUM) 5 MG tablet Take 5 mg by mouth.    . docusate sodium (COLACE) 50 MG capsule Take by mouth as needed for mild constipation or moderate constipation. Over the counter Equate stool softener    . estradiol (ESTRACE) 1 MG tablet Take 1 tablet (1 mg total) by mouth daily. 90 tablet 4  . fluticasone (FLONASE) 50 MCG/ACT nasal spray Place 2 sprays into both nostrils daily. 16 g 6  . Fluticasone-Salmeterol (ADVAIR) 250-50  MCG/DOSE AEPB Inhale 1 puff into the lungs as needed.    Marland Kitchen KLOR-CON M20 20 MEQ tablet TAKE 1 TABLET BY MOUTH EVERY DAY 90 tablet 1  . lansoprazole (PREVACID) 30 MG capsule TAKE 1 CAPSULE BY MOUTH ONCE DAILY 90 capsule 3  . levocetirizine (XYZAL) 5 MG tablet Take 5 mg by mouth every evening.     Marland Kitchen losartan-hydrochlorothiazide (HYZAAR) 50-12.5 MG per tablet TAKE 1 TABLET BY MOUTH DAILY. 30 tablet 0  . Melatonin 5 MG TBDP Take by mouth.    . montelukast (SINGULAIR) 10 MG tablet Take 10 mg by mouth at bedtime.    . traMADol (ULTRAM) 50 MG tablet Take 1 tablet (50 mg total) by mouth every 6 (six) hours as needed. 30 tablet 0   No current facility-administered medications on file prior to visit.     BP 138/88 (BP Location: Left Arm, Patient Position: Sitting, Cuff Size: Large)   Pulse 91   Temp 98.4 F (36.9 C) (Oral)   Ht 5\' 6"  (1.676 m)   Wt 219 lb 6.4 oz (99.5 kg)   SpO2 97%   BMI 35.41 kg/m       Objective:   Physical Exam  Constitutional: She is oriented to person, place, and time. She appears well-developed and well-nourished. No distress.  Cardiovascular: Normal rate, regular rhythm, normal heart sounds and intact distal pulses.  Exam reveals no gallop and no friction rub.   Pulmonary/Chest: Effort normal and breath sounds normal. No respiratory distress. She has no wheezes. She has no rales. She exhibits no tenderness.  Neurological: She is alert and oriented to person, place, and time.  Skin: Skin is warm and dry. No rash noted. She is not diaphoretic. No erythema. No pallor.  Psychiatric: She has a normal mood and affect. Her behavior is normal. Judgment and thought content normal.  Nursing note and vitals reviewed.      Assessment & Plan:  1. Elevated blood sugar - POC HgB A1c - 5.7  - Advised to diet and work on weight loss  2. Elevated blood pressure - Readings WNL - Will continue to monitor  - Advised to exercise and work on weight loss  Dorothyann Peng, NP

## 2015-12-31 ENCOUNTER — Other Ambulatory Visit: Payer: Self-pay | Admitting: Adult Health

## 2015-12-31 DIAGNOSIS — J3089 Other allergic rhinitis: Secondary | ICD-10-CM | POA: Diagnosis not present

## 2015-12-31 DIAGNOSIS — I10 Essential (primary) hypertension: Secondary | ICD-10-CM

## 2015-12-31 NOTE — Telephone Encounter (Signed)
Ok to refill, 90 days plus 3 refills

## 2015-12-31 NOTE — Telephone Encounter (Signed)
Ok to refill 

## 2016-01-07 ENCOUNTER — Telehealth: Payer: Self-pay | Admitting: *Deleted

## 2016-01-07 DIAGNOSIS — L602 Onychogryphosis: Secondary | ICD-10-CM | POA: Diagnosis not present

## 2016-01-07 NOTE — Telephone Encounter (Signed)
Pt informed with the below note. 

## 2016-01-07 NOTE — Telephone Encounter (Signed)
-----   Message from Terrance Mass, MD sent at 01/07/2016  9:41 AM EDT ----- Anderson Malta, please inform patient that I have spoken with Dr. Denman George GYN oncologist who she has seen before we'll be contacting her for an appointment to see her soon to discuss treatment options.

## 2016-01-23 DIAGNOSIS — J3089 Other allergic rhinitis: Secondary | ICD-10-CM | POA: Diagnosis not present

## 2016-01-28 DIAGNOSIS — J3089 Other allergic rhinitis: Secondary | ICD-10-CM | POA: Diagnosis not present

## 2016-02-06 DIAGNOSIS — J3089 Other allergic rhinitis: Secondary | ICD-10-CM | POA: Diagnosis not present

## 2016-02-11 DIAGNOSIS — J3089 Other allergic rhinitis: Secondary | ICD-10-CM | POA: Diagnosis not present

## 2016-02-20 DIAGNOSIS — J3089 Other allergic rhinitis: Secondary | ICD-10-CM | POA: Diagnosis not present

## 2016-02-23 DIAGNOSIS — J3089 Other allergic rhinitis: Secondary | ICD-10-CM | POA: Diagnosis not present

## 2016-02-26 DIAGNOSIS — J3089 Other allergic rhinitis: Secondary | ICD-10-CM | POA: Diagnosis not present

## 2016-03-01 DIAGNOSIS — J3089 Other allergic rhinitis: Secondary | ICD-10-CM | POA: Diagnosis not present

## 2016-03-12 DIAGNOSIS — J3089 Other allergic rhinitis: Secondary | ICD-10-CM | POA: Diagnosis not present

## 2016-03-17 DIAGNOSIS — J3089 Other allergic rhinitis: Secondary | ICD-10-CM | POA: Diagnosis not present

## 2016-03-19 ENCOUNTER — Ambulatory Visit (INDEPENDENT_AMBULATORY_CARE_PROVIDER_SITE_OTHER): Payer: Medicare Other

## 2016-03-19 DIAGNOSIS — Z23 Encounter for immunization: Secondary | ICD-10-CM

## 2016-03-26 DIAGNOSIS — J3089 Other allergic rhinitis: Secondary | ICD-10-CM | POA: Diagnosis not present

## 2016-04-01 DIAGNOSIS — J3089 Other allergic rhinitis: Secondary | ICD-10-CM | POA: Diagnosis not present

## 2016-04-07 ENCOUNTER — Telehealth: Payer: Self-pay | Admitting: Gynecologic Oncology

## 2016-04-07 NOTE — Telephone Encounter (Signed)
Spoke with patient about scheduling a follow up appt with Dr. Denman George.  Appt made for 04/21/16.  No concerns voiced.

## 2016-04-09 DIAGNOSIS — J3089 Other allergic rhinitis: Secondary | ICD-10-CM | POA: Diagnosis not present

## 2016-04-12 DIAGNOSIS — J3089 Other allergic rhinitis: Secondary | ICD-10-CM | POA: Diagnosis not present

## 2016-04-21 ENCOUNTER — Ambulatory Visit: Payer: Medicare Other | Attending: Gynecologic Oncology | Admitting: Gynecologic Oncology

## 2016-04-21 ENCOUNTER — Encounter: Payer: Self-pay | Admitting: Gynecologic Oncology

## 2016-04-21 VITALS — BP 197/82 | HR 77 | Temp 98.6°F | Resp 18 | Ht 66.0 in | Wt 210.9 lb

## 2016-04-21 DIAGNOSIS — K219 Gastro-esophageal reflux disease without esophagitis: Secondary | ICD-10-CM | POA: Insufficient documentation

## 2016-04-21 DIAGNOSIS — Z8541 Personal history of malignant neoplasm of cervix uteri: Secondary | ICD-10-CM | POA: Insufficient documentation

## 2016-04-21 DIAGNOSIS — Z90710 Acquired absence of both cervix and uterus: Secondary | ICD-10-CM | POA: Diagnosis not present

## 2016-04-21 DIAGNOSIS — C519 Malignant neoplasm of vulva, unspecified: Secondary | ICD-10-CM

## 2016-04-21 DIAGNOSIS — F17210 Nicotine dependence, cigarettes, uncomplicated: Secondary | ICD-10-CM | POA: Diagnosis not present

## 2016-04-21 DIAGNOSIS — F329 Major depressive disorder, single episode, unspecified: Secondary | ICD-10-CM | POA: Diagnosis not present

## 2016-04-21 DIAGNOSIS — F419 Anxiety disorder, unspecified: Secondary | ICD-10-CM | POA: Diagnosis not present

## 2016-04-21 DIAGNOSIS — I10 Essential (primary) hypertension: Secondary | ICD-10-CM | POA: Insufficient documentation

## 2016-04-21 DIAGNOSIS — E7800 Pure hypercholesterolemia, unspecified: Secondary | ICD-10-CM | POA: Diagnosis not present

## 2016-04-21 DIAGNOSIS — D071 Carcinoma in situ of vulva: Secondary | ICD-10-CM

## 2016-04-21 DIAGNOSIS — J45909 Unspecified asthma, uncomplicated: Secondary | ICD-10-CM | POA: Insufficient documentation

## 2016-04-21 DIAGNOSIS — Z8542 Personal history of malignant neoplasm of other parts of uterus: Secondary | ICD-10-CM | POA: Insufficient documentation

## 2016-04-21 NOTE — Patient Instructions (Signed)
Plan to follow up in Jan 2018 or sooner if needed.

## 2016-04-22 ENCOUNTER — Encounter: Payer: Self-pay | Admitting: Gynecologic Oncology

## 2016-04-22 DIAGNOSIS — C519 Malignant neoplasm of vulva, unspecified: Secondary | ICD-10-CM | POA: Insufficient documentation

## 2016-04-22 DIAGNOSIS — J3089 Other allergic rhinitis: Secondary | ICD-10-CM | POA: Diagnosis not present

## 2016-04-22 NOTE — Progress Notes (Signed)
Follow-up Note: Gyn-Onc  Consult was initially requested by Dr. Toney Rakes for the evaluation of Kristin Merritt 55 y.o. female  CC:  Chief Complaint  Patient presents with  . Vulvar Cancer    Assessment/Plan:  [name]  is a 55 y.o.  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 Has colposcopic findings consistent with either metaplasia vs dysplasia on the posterior left labia minora and some changes to the posterior introitus.  Recommended biopsy and offered this to be performed today. Patient declined.  Is electing to return in January for evaluation and biopsy at that time. If dysplasia is identified I will recommend re-excision given the closeness of margins at last surgery.  HPI: [name] is a 55 year old G1P1 who is seen in consultation at the request of Dr Toney Rakes for [facility] and possible invasive carcinoma of the vulva. The patient has a history of VIN3 and lichen sclerosis diagnosed in 2008 in [place]. She did not seek treatment for this because she had many life changes and pressures at the time. She relocated to [facility] in 2014 at which time she saw Dr Toney Rakes to establish care. At a routine visit he noted an area on her perineal body which she told him she had been treating with a cream (presumably steroid) and he represcribed the same. In 2017 it was persistent and therefore he biopsied the area to determine if it was dysplasia or lichen sclerosis. The pathology returned VIN3. This prompted her to go to the OR with Dr Toney Rakes on 11/18/15 for a wide local excision of the perinenal body (and left thigh excision). 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 There was no comment regarding margin status or depth of invasion on the pathology report. The pathology comments that there are "2 small foci of squamous cells present at the superficial dermis. It is  difficult to determine whether these represent tangential section of the in situ vulva carcinoma or represent associated focal infiltrating squamous cell carcinoma. Multiple deeper sections have been examined."   Since the surgery the patient has experienced wound separation and bleeding.   She reports a history of tobacco use and prior cervical cancer (possibly dysplasia) which was treated with a vaginal hysterectomy.  Interval Hx: The patient has been feeling well with no pruritis, nodules or discomfort.  Current Meds:  Outpatient Encounter Prescriptions as of 04/21/2016  Medication Sig  . albuterol (PROVENTIL HFA;VENTOLIN HFA) 108 (90 BASE) MCG/ACT inhaler Inhale 2 puffs into the lungs every 6 (six) hours as needed.  Marland Kitchen atorvastatin (LIPITOR) 20 MG tablet TAKE 1 TABLET BY MOUTH EVERY DAY  . cholecalciferol (VITAMIN D) 1000 UNITS tablet Take 1,000 Units by mouth daily.  Marland Kitchen docusate sodium (COLACE) 50 MG capsule Take by mouth as needed for mild constipation or moderate constipation. Over the counter Equate stool softener  . estradiol (ESTRACE) 1 MG tablet Take 1 tablet (1 mg total) by mouth daily.  . fluticasone (FLONASE) 50 MCG/ACT nasal spray Place 2 sprays into both nostrils daily.  . Fluticasone-Salmeterol (ADVAIR) 250-50 MCG/DOSE AEPB Inhale 1 puff into the lungs as needed.  Marland Kitchen KLOR-CON M20 20 MEQ tablet TAKE 1 TABLET BY MOUTH EVERY DAY  . lansoprazole (PREVACID) 30 MG capsule TAKE 1 CAPSULE BY MOUTH ONCE DAILY  . levocetirizine (XYZAL) 5 MG tablet Take 5 mg by mouth every evening.   Marland Kitchen losartan-hydrochlorothiazide (HYZAAR) 50-12.5 MG per tablet  TAKE 1 TABLET BY MOUTH DAILY.  Marland Kitchen losartan-hydrochlorothiazide (HYZAAR) 50-12.5 MG tablet TAKE 1 TABLET BY MOUTH DAILY.  . Melatonin 5 MG TBDP Take by mouth.  . montelukast (SINGULAIR) 10 MG tablet Take 10 mg by mouth at bedtime.  . clobetasol cream (TEMOVATE) 0.05 % S:apply to affected area bid x 1 week then 2 x weekly for 4-6 weeks. (Patient not  taking: Reported on 04/21/2016)  . diazepam (VALIUM) 5 MG tablet Take 5 mg by mouth.  . traMADol (ULTRAM) 50 MG tablet Take 1 tablet (50 mg total) by mouth every 6 (six) hours as needed. (Patient not taking: Reported on 04/21/2016)   No facility-administered encounter medications on file as of 04/21/2016.     Allergy:  Allergies  Allergen Reactions  . Percocet [Oxycodone-Acetaminophen] Itching    Social Hx:   Social History   Social History  . Marital status: Widowed    Spouse name: N/A  . Number of children: N/A  . Years of education: N/A   Occupational History  . Not on file.   Social History Main Topics  . Smoking status: Current Some Day Smoker    Packs/day: 0.25    Years: 20.00    Types: Cigarettes  . Smokeless tobacco: Never Used  . Alcohol use 0.0 oz/week     Comment: rare  . Drug use: No  . Sexual activity: Yes    Birth control/ protection: Condom, Surgical   Other Topics Concern  . 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 Enjoys walking   Diet: Tries to watch what she eats. Does not eat a lot of processed foods or fast food.     Past Surgical Hx:  Past Surgical History:  Procedure Laterality Date  . EYE SURGERY     "Lazy Muscle"  . SPINE SURGERY     L5-s1  . VAGINAL HYSTERECTOMY    . VULVA /PERINEUM BIOPSY N/A 11/18/2015   Procedure: WIDE LOCAL EXCISION OF VULVA and MEDIAL LEFT THIGH LESION ;  Surgeon: Terrance Mass, MD;  Location: [facility];  Service: Gynecology;  Laterality: N/A;  . WISDOM TOOTH EXTRACTION      Past Medical Hx:  Past Medical History:  Diagnosis Date  . Allergy   . Anxiety   . Asthma   . Depression   . GERD (gastroesophageal reflux disease)   . Heart murmur   . High cholesterol   . Hypertension   . Leg cramps   . Lichen sclerosus et atrophicus   . Numbness and  tingling in hands   . Postmenopausal HRT (hormone replacement therapy) - followed by Dr. Toney Rakes in gyn 07/11/2012  . Swelling of both hands   . Uterine cancer ([place]) 1991  . UTI (urinary tract infection)   . 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 VIN III (vulvar intraepithelial neoplasia III)     Past Gynecological History:  Dysplasia/cancer of the cervix, VIN3, lichen sclerosis.  No LMP recorded. Patient has had a hysterectomy.  Family Hx:  Family History  Problem Relation Age of Onset  . Cancer Mother     LUNG   . Breast cancer Mother     Died    Review of Systems:  Constitutional  Feels well,    ENT Normal appearing ears and nares bilaterally Skin/Breast  No rash, sores, jaundice, itching, dryness Cardiovascular  No chest pain, shortness of breath, or edema  Pulmonary  No cough or wheeze.  Gastro Intestinal  No nausea, vomitting, or diarrhoea. No bright red blood per rectum, no abdominal pain, change in bowel movement, or constipation.  Genito Urinary  No frequency, urgency, dysuria, no pruritis Musculo Skeletal  No myalgia, arthralgia, joint swelling or pain  Neurologic  No weakness, numbness, change in gait,  Psychology  No depression, anxiety, insomnia.   Vitals:  Blood pressure (!) 197/82, pulse 77, temperature 98.6 F (37 C), temperature source Oral, resp. rate 18, height 5\' 6"  (1.676 m), weight 210 lb 14.4 oz (95.7 kg), SpO2 100 %.  Physical Exam: WD in NAD Neck  Supple NROM, without any enlargements.  Lymph Node Survey No cervical supraclavicular or inguinal adenopathy Cardiovascular  Pulse normal rate, regularity and rhythm. S1 and S2 normal.  Lungs  Clear to auscultation bilateraly, without wheezes/crackles/rhonchi. Good air movement.  Skin  No rash/lesions/breakdown  Psychiatry  Alert and oriented to person, place, and time  Abdomen  Normoactive bowel sounds, abdomen soft, non-tender and obese without evidence  of hernia. Back No CVA tenderness Genito Urinary  Vulva/vagina: 5% acetic acid applied and an acetowhite area appeared on distal posterior left labia minora. Subtle acetowhite changes on posterior introitus but looks more consistent with metaplasia.   Bladder/urethra:  No lesions or masses, well supported bladder  Vagina: deferred  Cervix: surgically absent  Uterus: surgcially absent   Adnexa: deferred exam  Rectal  Anal hemorrhoid anteriorally Extremities  No bilateral cyanosis, clubbing or edema.   Donaciano Eva, MD  04/22/2016, 4:37 PM

## 2016-04-27 DIAGNOSIS — J3089 Other allergic rhinitis: Secondary | ICD-10-CM | POA: Diagnosis not present

## 2016-05-06 ENCOUNTER — Encounter: Payer: Self-pay | Admitting: Adult Health

## 2016-05-06 ENCOUNTER — Ambulatory Visit (INDEPENDENT_AMBULATORY_CARE_PROVIDER_SITE_OTHER): Payer: Medicare Other | Admitting: Adult Health

## 2016-05-06 VITALS — BP 138/74 | Temp 98.6°F | Ht 66.0 in | Wt 214.6 lb

## 2016-05-06 DIAGNOSIS — N3000 Acute cystitis without hematuria: Secondary | ICD-10-CM

## 2016-05-06 LAB — POCT URINALYSIS DIPSTICK
Bilirubin, UA: NEGATIVE
Blood, UA: NEGATIVE
Glucose, UA: NEGATIVE
Ketones, UA: NEGATIVE
NITRITE UA: NEGATIVE
PROTEIN UA: 0.2
Spec Grav, UA: 1.025
UROBILINOGEN UA: NEGATIVE
pH, UA: 7

## 2016-05-06 MED ORDER — FLUCONAZOLE 150 MG PO TABS: 150.0000 mg | ORAL_TABLET | Freq: Once | ORAL | 1 refills | Status: AC

## 2016-05-06 MED ORDER — SULFAMETHOXAZOLE-TRIMETHOPRIM 800-160 MG PO TABS: 1.0000 | ORAL_TABLET | Freq: Two times a day (BID) | ORAL | 0 refills | Status: DC

## 2016-05-06 NOTE — Progress Notes (Signed)
Subjective:    Patient ID: Kristin Merritt, female    DOB: August 02, 1960, 55 y.o.   MRN: [id number]  HPI  55 year old female who presents to the office today for possible UTI. She reports that for the last 3-4 days she had urinary frequency, foul smelling urine and abdominal pain.    She denies and fevers, dysuria, or hematuria.   Review of Systems  Constitutional: Negative.   Respiratory: Negative.   Genitourinary: Positive for frequency and urgency. Negative for dysuria.  All other systems reviewed and are negative.  Past Medical History:  Diagnosis Date  . Allergy   . Anxiety   . Asthma   . Depression   . GERD (gastroesophageal reflux disease)   . Heart murmur   . High cholesterol   . Hypertension   . Leg cramps   . Lichen sclerosus et atrophicus   . Numbness and tingling in hands   . Postmenopausal HRT (hormone replacement therapy) - followed by Dr. Toney Rakes in gyn 07/11/2012  . Swelling of both hands   . Uterine cancer ([place]) 1991  . UTI (urinary tract infection)   . 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 VIN III (vulvar intraepithelial neoplasia III)     Social History   Social History  . Marital status: Widowed    Spouse name: N/A  . Number of children: N/A  . Years of education: N/A   Occupational History  . Not on file.   Social History Main Topics  . Smoking status: Current Some Day Smoker    Packs/day: 0.25    Years: 20.00    Types: Cigarettes  . Smokeless tobacco: Never Used  . Alcohol use 0.0 oz/week     Comment: rare  . Drug use: No  . Sexual activity: Yes    Birth control/ protection: Condom, Surgical   Other Topics Concern  . 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 Enjoys walking   Diet: Tries to watch what she eats. Does not eat a lot of processed  foods or fast food.     Past Surgical History:  Procedure Laterality Date  . EYE SURGERY     "Lazy Muscle"  . SPINE SURGERY     L5-s1  . VAGINAL HYSTERECTOMY    . VULVA /PERINEUM BIOPSY N/A 11/18/2015   Procedure: WIDE LOCAL EXCISION OF VULVA and MEDIAL LEFT THIGH LESION ;  Surgeon: Terrance Mass, MD;  Location: [facility];  Service: Gynecology;  Laterality: N/A;  . WISDOM TOOTH EXTRACTION      Family History  Problem Relation Age of Onset  . Cancer Mother     LUNG   . Breast cancer Mother     Died    Allergies  Allergen Reactions  . Percocet [Oxycodone-Acetaminophen] Itching    Current Outpatient Prescriptions on File Prior to Visit  Medication Sig Dispense Refill  . albuterol (PROVENTIL HFA;VENTOLIN HFA) 108 (90 BASE) MCG/ACT inhaler Inhale 2 puffs into the lungs every 6 (six) hours as needed.    Marland Kitchen atorvastatin (LIPITOR) 20 MG tablet TAKE 1 TABLET BY MOUTH EVERY DAY 90 tablet 3  . cholecalciferol (VITAMIN D) 1000 UNITS tablet Take 1,000 Units by mouth daily.    Marland Kitchen  clobetasol cream (TEMOVATE) 0.05 % S:apply to affected area bid x 1 week then 2 x weekly for 4-6 weeks. 30 g 1  . diazepam (VALIUM) 5 MG tablet Take 5 mg by mouth.    . docusate sodium (COLACE) 50 MG capsule Take by mouth as needed for mild constipation or moderate constipation. Over the counter Equate stool softener    . estradiol (ESTRACE) 1 MG tablet Take 1 tablet (1 mg total) by mouth daily. 90 tablet 4  . fluticasone (FLONASE) 50 MCG/ACT nasal spray Place 2 sprays into both nostrils daily. 16 g 6  . Fluticasone-Salmeterol (ADVAIR) 250-50 MCG/DOSE AEPB Inhale 1 puff into the lungs as needed.    Marland Kitchen KLOR-CON M20 20 MEQ tablet TAKE 1 TABLET BY MOUTH EVERY DAY 90 tablet 1  . lansoprazole (PREVACID) 30 MG capsule TAKE 1 CAPSULE BY MOUTH ONCE DAILY 90 capsule 3  . levocetirizine (XYZAL) 5 MG tablet Take 5 mg by mouth every evening.     Marland Kitchen losartan-hydrochlorothiazide (HYZAAR) 50-12.5 MG per tablet TAKE 1 TABLET BY MOUTH  DAILY. 30 tablet 0  . losartan-hydrochlorothiazide (HYZAAR) 50-12.5 MG tablet TAKE 1 TABLET BY MOUTH DAILY. 90 tablet 3  . montelukast (SINGULAIR) 10 MG tablet Take 10 mg by mouth at bedtime.     No current facility-administered medications on file prior to visit.     BP 138/74   Temp 98.6 F (37 C) (Oral)   Ht 5\' 6"  (1.676 m)   Wt 214 lb 9.6 oz (97.3 kg)   BMI 34.64 kg/m       Objective:   Physical Exam  Constitutional: She is oriented to person, place, and time. She appears well-developed and well-nourished. No distress.  Cardiovascular: Normal rate, regular rhythm, normal heart sounds and intact distal pulses.  Exam reveals no gallop and no friction rub.   No murmur heard. Pulmonary/Chest: Effort normal and breath sounds normal. No respiratory distress. She has no wheezes. She has no rales. She exhibits no tenderness.  Abdominal: Soft. Bowel sounds are normal. She exhibits no distension and no mass. There is no tenderness. There is no rebound, no guarding and no CVA tenderness.  Neurological: She is alert and oriented to person, place, and time.  Skin: Skin is warm and dry. No rash noted. She is not diaphoretic. No erythema. No pallor.  Psychiatric: She has a normal mood and affect. Her behavior is normal. Judgment and thought content normal.  Nursing note and vitals reviewed.     Assessment & Plan:  1. Acute cystitis without hematuria - POCT urinalysis dipstick - Urine culture - sulfamethoxazole-trimethoprim (BACTRIM DS,SEPTRA DS) 800-160 MG tablet; Take 1 tablet by mouth 2 (two) times daily.  Dispense: 6 tablet; Refill: 0 - fluconazole (DIFLUCAN) 150 MG tablet; Take 1 tablet (150 mg total) by mouth once.  Dispense: 1 tablet; Refill: 1 - Drink plenty of fluids  - Follow up if no improvement   Dorothyann Peng, NP

## 2016-05-07 DIAGNOSIS — J3089 Other allergic rhinitis: Secondary | ICD-10-CM | POA: Diagnosis not present

## 2016-05-08 LAB — URINE CULTURE

## 2016-05-11 DIAGNOSIS — J3089 Other allergic rhinitis: Secondary | ICD-10-CM | POA: Diagnosis not present

## 2016-05-19 DIAGNOSIS — J3089 Other allergic rhinitis: Secondary | ICD-10-CM | POA: Diagnosis not present

## 2016-05-27 ENCOUNTER — Encounter: Payer: Self-pay | Admitting: Adult Health

## 2016-05-27 ENCOUNTER — Ambulatory Visit (INDEPENDENT_AMBULATORY_CARE_PROVIDER_SITE_OTHER): Payer: Medicare Other | Admitting: Adult Health

## 2016-05-27 VITALS — BP 148/88 | Temp 98.2°F | Ht 66.0 in | Wt 213.0 lb

## 2016-05-27 DIAGNOSIS — R8290 Unspecified abnormal findings in urine: Secondary | ICD-10-CM

## 2016-05-27 LAB — POCT URINALYSIS DIPSTICK
BILIRUBIN UA: NEGATIVE
Blood, UA: NEGATIVE
GLUCOSE UA: NEGATIVE
KETONES UA: NEGATIVE
LEUKOCYTES UA: NEGATIVE
Nitrite, UA: NEGATIVE
PH UA: 6.5
Protein, UA: NEGATIVE
Spec Grav, UA: 1.025
Urobilinogen, UA: NEGATIVE

## 2016-05-27 NOTE — Progress Notes (Signed)
Subjective:    Patient ID: Kristin Merritt, female    DOB: 1960/06/13, 56 y.o.   MRN: [id number]  HPI  56 year old female who presents to the office today for continued UTI type symptoms. I last saw her on 12.14/2017 for a UTI and she was prescribed bactrim BID x 6 days. She reports that her symptoms resolved but about 5--6 days after finishing her antibiotics, her symptoms returned. She reports that she has had odorous urine and slight burning. Her symptoms are consistent daily.   Denies any fevers, urgency, frequency, or cloudy urine. Has no pelvic or flank pain   She endorses staying hydrated  Review of Systems  Constitutional: Negative.   Respiratory: Negative.   Cardiovascular: Negative.   Genitourinary: Positive for dysuria. Negative for decreased urine volume, difficulty urinating, dyspareunia, enuresis, flank pain, frequency, hematuria, pelvic pain, urgency, vaginal bleeding, vaginal discharge and vaginal pain.       Odorous urine  All other systems reviewed and are negative.  Past Medical History:  Diagnosis Date  . Allergy   . Anxiety   . Asthma   . Depression   . GERD (gastroesophageal reflux disease)   . Heart murmur   . High cholesterol   . Hypertension   . Leg cramps   . Lichen sclerosus et atrophicus   . Numbness and tingling in hands   . Postmenopausal HRT (hormone replacement therapy) - followed by Dr. Toney Rakes in gyn 07/11/2012  . Swelling of both hands   . Uterine cancer ([place]) 1991  . UTI (urinary tract infection)   . 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 VIN III (vulvar intraepithelial neoplasia III)     Social History   Social History  . Marital status: Widowed    Spouse name: N/A  . Number of children: N/A  . Years of education: N/A   Occupational History  . Not on file.   Social History Main Topics  . Smoking status: Current Some Day Smoker    Packs/day: 0.25    Years: 20.00    Types: Cigarettes  .  Smokeless tobacco: Never Used  . Alcohol use 0.0 oz/week     Comment: rare  . Drug use: No  . Sexual activity: Yes    Birth control/ protection: Condom, Surgical   Other Topics Concern  . 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 Enjoys walking   Diet: Tries to watch what she eats. Does not eat a lot of processed foods or fast food.     Past Surgical History:  Procedure Laterality Date  . EYE SURGERY     "Lazy Muscle"  . SPINE SURGERY     L5-s1  . VAGINAL HYSTERECTOMY    . VULVA /PERINEUM BIOPSY N/A 11/18/2015   Procedure: WIDE LOCAL EXCISION OF VULVA and MEDIAL LEFT THIGH LESION ;  Surgeon: Terrance Mass, MD;  Location: [facility];  Service: Gynecology;  Laterality: N/A;  . WISDOM TOOTH EXTRACTION      Family History  Problem Relation Age of Onset  . Cancer Mother     LUNG   . Breast cancer Mother     Died    Allergies  Allergen Reactions  .  Percocet [Oxycodone-Acetaminophen] Itching    Current Outpatient Prescriptions on File Prior to Visit  Medication Sig Dispense Refill  . albuterol (PROVENTIL HFA;VENTOLIN HFA) 108 (90 BASE) MCG/ACT inhaler Inhale 2 puffs into the lungs every 6 (six) hours as needed.    Marland Kitchen atorvastatin (LIPITOR) 20 MG tablet TAKE 1 TABLET BY MOUTH EVERY DAY 90 tablet 3  . cholecalciferol (VITAMIN D) 1000 UNITS tablet Take 1,000 Units by mouth daily.    . clobetasol cream (TEMOVATE) 0.05 % S:apply to affected area bid x 1 week then 2 x weekly for 4-6 weeks. 30 g 1  . diazepam (VALIUM) 5 MG tablet Take 5 mg by mouth.    . docusate sodium (COLACE) 50 MG capsule Take by mouth as needed for mild constipation or moderate constipation. Over the counter Equate stool softener    . estradiol (ESTRACE) 1 MG tablet Take 1 tablet (1 mg total) by mouth daily. 90 tablet 4  . fluticasone (FLONASE) 50 MCG/ACT  nasal spray Place 2 sprays into both nostrils daily. 16 g 6  . Fluticasone-Salmeterol (ADVAIR) 250-50 MCG/DOSE AEPB Inhale 1 puff into the lungs as needed.    Marland Kitchen KLOR-CON M20 20 MEQ tablet TAKE 1 TABLET BY MOUTH EVERY DAY 90 tablet 1  . lansoprazole (PREVACID) 30 MG capsule TAKE 1 CAPSULE BY MOUTH ONCE DAILY 90 capsule 3  . levocetirizine (XYZAL) 5 MG tablet Take 5 mg by mouth every evening.     Marland Kitchen losartan-hydrochlorothiazide (HYZAAR) 50-12.5 MG per tablet TAKE 1 TABLET BY MOUTH DAILY. 30 tablet 0  . losartan-hydrochlorothiazide (HYZAAR) 50-12.5 MG tablet TAKE 1 TABLET BY MOUTH DAILY. 90 tablet 3  . montelukast (SINGULAIR) 10 MG tablet Take 10 mg by mouth at bedtime.    . sulfamethoxazole-trimethoprim (BACTRIM DS,SEPTRA DS) 800-160 MG tablet Take 1 tablet by mouth 2 (two) times daily. 6 tablet 0   No current facility-administered medications on file prior to visit.     BP (!) 148/88   Temp 98.2 F (36.8 C) (Oral)   Ht 5\' 6"  (1.676 m)   Wt 213 lb (96.6 kg)   BMI 34.38 kg/m       Objective:   Physical Exam  Constitutional: She is oriented to person, place, and time. She appears well-developed and well-nourished. No distress.  Cardiovascular: Normal rate, regular rhythm, normal heart sounds and intact distal pulses.  Exam reveals no friction rub.   No murmur heard. Pulmonary/Chest: Breath sounds normal. No respiratory distress. She has no wheezes. She has no rales.  Abdominal: Soft. Bowel sounds are normal. She exhibits no distension and no mass. There is no tenderness. There is no rebound and no guarding.  Neurological: She is alert and oriented to person, place, and time.  Skin: Skin is warm and dry. No rash noted. She is not diaphoretic. No erythema. No pallor.  Psychiatric: She has a normal mood and affect. Her behavior is normal. Judgment and thought content normal.  Nursing note and vitals reviewed.     Assessment & Plan:  1. Abnormal urine odor - Possible food related  issue - POCT urinalysis dipstick- negative - Culture, Urine - Increase fluids   Dorothyann Peng, NP

## 2016-06-02 ENCOUNTER — Other Ambulatory Visit: Payer: Self-pay | Admitting: Adult Health

## 2016-06-03 ENCOUNTER — Other Ambulatory Visit (INDEPENDENT_AMBULATORY_CARE_PROVIDER_SITE_OTHER): Payer: Medicare Other

## 2016-06-03 DIAGNOSIS — I10 Essential (primary) hypertension: Secondary | ICD-10-CM

## 2016-06-03 DIAGNOSIS — E785 Hyperlipidemia, unspecified: Secondary | ICD-10-CM | POA: Diagnosis not present

## 2016-06-03 LAB — CBC WITH DIFFERENTIAL/PLATELET
BASOS ABS: 0 10*3/uL (ref 0.0–0.1)
Basophils Relative: 0.3 % (ref 0.0–3.0)
Eosinophils Absolute: 0.1 10*3/uL (ref 0.0–0.7)
Eosinophils Relative: 1.5 % (ref 0.0–5.0)
HCT: 38.3 % (ref 36.0–46.0)
Hemoglobin: 12.4 g/dL (ref 12.0–15.0)
LYMPHS ABS: 2.3 10*3/uL (ref 0.7–4.0)
Lymphocytes Relative: 29.6 % (ref 12.0–46.0)
MCHC: 32.3 g/dL (ref 30.0–36.0)
MCV: 81.5 fl (ref 78.0–100.0)
MONO ABS: 0.5 10*3/uL (ref 0.1–1.0)
MONOS PCT: 6.9 % (ref 3.0–12.0)
NEUTROS ABS: 4.9 10*3/uL (ref 1.4–7.7)
NEUTROS PCT: 61.7 % (ref 43.0–77.0)
PLATELETS: 181 10*3/uL (ref 150.0–400.0)
RBC: 4.7 Mil/uL (ref 3.87–5.11)
RDW: 15 % (ref 11.5–15.5)
WBC: 7.9 10*3/uL (ref 4.0–10.5)

## 2016-06-03 LAB — HEPATIC FUNCTION PANEL
ALBUMIN: 4.2 g/dL (ref 3.5–5.2)
ALK PHOS: 61 U/L (ref 39–117)
ALT: 16 U/L (ref 0–35)
AST: 16 U/L (ref 0–37)
BILIRUBIN TOTAL: 0.5 mg/dL (ref 0.2–1.2)
Bilirubin, Direct: 0.1 mg/dL (ref 0.0–0.3)
Total Protein: 6.5 g/dL (ref 6.0–8.3)

## 2016-06-03 LAB — BASIC METABOLIC PANEL
BUN: 14 mg/dL (ref 6–23)
CALCIUM: 9.4 mg/dL (ref 8.4–10.5)
CO2: 31 meq/L (ref 19–32)
CREATININE: 0.89 mg/dL (ref 0.40–1.20)
Chloride: 103 mEq/L (ref 96–112)
GFR: 84.57 mL/min (ref >60.00)
GLUCOSE: 97 mg/dL (ref 70–99)
Potassium: 3.9 mEq/L (ref 3.5–5.1)
Sodium: 142 mEq/L (ref 135–145)

## 2016-06-03 LAB — LIPID PANEL
Cholesterol: 176 mg/dL (ref 0–200)
HDL: 73.6 mg/dL (ref >39.00)
LDL Cholesterol: 77 mg/dL (ref 0–99)
NonHDL: 102.26
TRIGLYCERIDES: 126 mg/dL (ref 0.0–149.0)
Total CHOL/HDL Ratio: 2
VLDL: 25.2 mg/dL (ref 0.0–40.0)

## 2016-06-03 LAB — TSH: TSH: 0.51 u[IU]/mL (ref 0.35–4.50)

## 2016-06-06 LAB — URINE CULTURE

## 2016-06-07 ENCOUNTER — Other Ambulatory Visit: Payer: Self-pay

## 2016-06-10 ENCOUNTER — Encounter: Payer: Self-pay | Admitting: Adult Health

## 2016-06-14 ENCOUNTER — Ambulatory Visit: Payer: Self-pay | Admitting: Gynecologic Oncology

## 2016-06-14 ENCOUNTER — Telehealth: Payer: Self-pay | Admitting: Gynecologic Oncology

## 2016-06-14 NOTE — Telephone Encounter (Signed)
Patient called and moved 1/22 appointment with Dr Denman George to 2/21. Patient has new date/time.

## 2016-06-15 ENCOUNTER — Encounter: Payer: Self-pay | Admitting: Adult Health

## 2016-06-15 ENCOUNTER — Ambulatory Visit (INDEPENDENT_AMBULATORY_CARE_PROVIDER_SITE_OTHER): Payer: PRIVATE HEALTH INSURANCE | Admitting: Adult Health

## 2016-06-15 VITALS — BP 124/68 | Temp 98.1°F | Ht 66.0 in | Wt 212.0 lb

## 2016-06-15 DIAGNOSIS — Z0000 Encounter for general adult medical examination without abnormal findings: Secondary | ICD-10-CM

## 2016-06-15 DIAGNOSIS — N3000 Acute cystitis without hematuria: Secondary | ICD-10-CM

## 2016-06-15 DIAGNOSIS — I10 Essential (primary) hypertension: Secondary | ICD-10-CM

## 2016-06-15 DIAGNOSIS — Z1211 Encounter for screening for malignant neoplasm of colon: Secondary | ICD-10-CM

## 2016-06-15 DIAGNOSIS — E785 Hyperlipidemia, unspecified: Secondary | ICD-10-CM | POA: Diagnosis not present

## 2016-06-15 MED ORDER — NITROFURANTOIN MONOHYD MACRO 100 MG PO CAPS: 100.0000 mg | ORAL_CAPSULE | Freq: Two times a day (BID) | ORAL | 0 refills | Status: DC

## 2016-06-15 NOTE — Progress Notes (Signed)
Subjective:    Patient ID: Kristin Merritt, female    DOB: 1961/02/02, 56 y.o.   MRN: [id number]  HPI  Patient presents for yearly preventative medicine examination. She is a pleasant 56 year old female who  has a past medical history of Allergy; Anxiety; Asthma; Depression; GERD (gastroesophageal reflux disease); Heart murmur; High cholesterol; Hypertension; Leg cramps; Lichen sclerosus et atrophicus; Numbness and tingling in hands; Postmenopausal HRT (hormone replacement therapy) - followed by Dr. Toney Rakes in gyn (07/11/2012); Swelling of both hands; Uterine cancer ([place]) (1991); UTI (urinary tract infection); VIN III (vulvar intraepithelial neoplasia III); and VIN III (vulvar intraepithelial neoplasia III).  All immunizations and health maintenance protocols were reviewed with the patient and needed orders were placed.  Medication reconciliation,  past medical history, social history, problem list and allergies were reviewed in detail with the patient  Goals were established with regard to weight loss, exercise, and  diet in compliance with medications. She has started working on her diet and exercise. She is walking a lot more at work.   Wt Readings from Last 3 Encounters:  06/15/16 212 lb (96.2 kg)  05/27/16 213 lb (96.6 kg)  05/06/16 214 lb 9.6 oz (97.3 kg)     She is up to date on her mammogram but has not had a colonoscopy yet.   She is complaining of UTI type symptoms of dysuria, frequency and urgency. Her urine culture shows UTI but there is no UA available.    Review of Systems  Constitutional: Negative.   HENT: Negative.   Eyes: Negative.   Respiratory: Negative.   Cardiovascular: Negative.   Gastrointestinal: Negative.   Endocrine: Negative.   Genitourinary: Positive for dysuria, frequency and urgency. Negative for difficulty urinating, enuresis, flank pain and hematuria.       Abnormal urine odor   Musculoskeletal: Negative.   Skin: Negative.   Allergic/Immunologic:  Negative.   Neurological: Negative.   Hematological: Negative.   Psychiatric/Behavioral: Negative.   All other systems reviewed and are negative.  Past Medical History:  Diagnosis Date  . Allergy   . Anxiety   . Asthma   . Depression   . GERD (gastroesophageal reflux disease)   . Heart murmur   . High cholesterol   . Hypertension   . Leg cramps   . Lichen sclerosus et atrophicus   . Numbness and tingling in hands   . Postmenopausal HRT (hormone replacement therapy) - followed by Dr. Toney Rakes in gyn 07/11/2012  . Swelling of both hands   . Uterine cancer ([place]) 1991  . UTI (urinary tract infection)   . 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 VIN III (vulvar intraepithelial neoplasia III)     Social History   Social History  . Marital status: Widowed    Spouse name: N/A  . Number of children: N/A  . Years of education: N/A   Occupational History  . Not on file.   Social History Main Topics  . Smoking status: Current Some Day Smoker    Packs/day: 0.25    Years: 20.00    Types: Cigarettes  . Smokeless tobacco: Never Used  . Alcohol use 0.0 oz/week     Comment: rare  . Drug use: No  . Sexual activity: Yes    Birth control/ protection: Condom, Surgical   Other Topics Concern  . Not on file   Social History Narrative   Work or School: retired Programmer, applications  Home Situation: lives alone       Spiritual Beliefs: Christain            Exercise: Has no motivation. Enjoys walking   Diet: Tries to watch what she eats. Does not eat a lot of processed foods or fast food.     Past Surgical History:  Procedure Laterality Date  . EYE SURGERY     "Lazy Muscle"  . SPINE SURGERY     L5-s1  . VAGINAL HYSTERECTOMY    . VULVA /PERINEUM BIOPSY N/A 11/18/2015   Procedure: WIDE LOCAL EXCISION OF VULVA and MEDIAL LEFT THIGH LESION ;  Surgeon: Terrance Mass, MD;  Location: [facility];  Service: Gynecology;  Laterality: N/A;  . WISDOM  TOOTH EXTRACTION      Family History  Problem Relation Age of Onset  . Cancer Mother     LUNG   . Breast cancer Mother     Died    Allergies  Allergen Reactions  . Percocet [Oxycodone-Acetaminophen] Itching    Current Outpatient Prescriptions on File Prior to Visit  Medication Sig Dispense Refill  . albuterol (PROVENTIL HFA;VENTOLIN HFA) 108 (90 BASE) MCG/ACT inhaler Inhale 2 puffs into the lungs every 6 (six) hours as needed.    Marland Kitchen atorvastatin (LIPITOR) 20 MG tablet TAKE 1 TABLET BY MOUTH EVERY DAY 90 tablet 3  . cholecalciferol (VITAMIN D) 1000 UNITS tablet Take 1,000 Units by mouth daily.    . clobetasol cream (TEMOVATE) 0.05 % S:apply to affected area bid x 1 week then 2 x weekly for 4-6 weeks. 30 g 1  . diazepam (VALIUM) 5 MG tablet Take 5 mg by mouth.    . docusate sodium (COLACE) 50 MG capsule Take by mouth as needed for mild constipation or moderate constipation. Over the counter Equate stool softener    . estradiol (ESTRACE) 1 MG tablet Take 1 tablet (1 mg total) by mouth daily. 90 tablet 4  . fluticasone (FLONASE) 50 MCG/ACT nasal spray Place 2 sprays into both nostrils daily. 16 g 6  . Fluticasone-Salmeterol (ADVAIR) 250-50 MCG/DOSE AEPB Inhale 1 puff into the lungs as needed.    Marland Kitchen KLOR-CON M20 20 MEQ tablet TAKE 1 TABLET BY MOUTH EVERY DAY 90 tablet 1  . lansoprazole (PREVACID) 30 MG capsule TAKE 1 CAPSULE BY MOUTH ONCE DAILY 90 capsule 3  . levocetirizine (XYZAL) 5 MG tablet Take 5 mg by mouth every evening.     Marland Kitchen losartan-hydrochlorothiazide (HYZAAR) 50-12.5 MG tablet TAKE 1 TABLET BY MOUTH DAILY. 90 tablet 3  . montelukast (SINGULAIR) 10 MG tablet Take 10 mg by mouth at bedtime.    . sulfamethoxazole-trimethoprim (BACTRIM DS,SEPTRA DS) 800-160 MG tablet Take 1 tablet by mouth 2 (two) times daily. 6 tablet 0   No current facility-administered medications on file prior to visit.     BP 124/68   Temp 98.1 F (36.7 C) (Oral)   Ht 5\' 6"  (1.676 m)   Wt 212 lb (96.2  kg)   BMI 34.22 kg/m       Objective:   Physical Exam  Constitutional: She is oriented to person, place, and time. She appears well-developed and well-nourished. No distress.  HENT:  Head: Normocephalic and atraumatic.  Right Ear: External ear normal.  Left Ear: External ear normal.  Nose: Nose normal.  Mouth/Throat: Oropharynx is clear and moist. No oropharyngeal exudate.  Eyes: Conjunctivae are normal. Right eye exhibits no discharge. Left eye exhibits no discharge. No scleral icterus.  Neck: Normal range  of motion. Neck supple. No JVD present. Carotid bruit is not present. No tracheal deviation present. No thyromegaly present.  Cardiovascular: Normal rate, regular rhythm, normal heart sounds and intact distal pulses.  Exam reveals no gallop and no friction rub.   No murmur heard. Pulmonary/Chest: Effort normal and breath sounds normal. No stridor. No respiratory distress. She has no wheezes. She has no rales. She exhibits no tenderness.  Abdominal: Soft. Bowel sounds are normal. She exhibits no distension and no mass. There is no tenderness. There is no rebound and no guarding.  Musculoskeletal: Normal range of motion. She exhibits no edema, tenderness or deformity.  Lymphadenopathy:    She has no cervical adenopathy.  Neurological: She is alert and oriented to person, place, and time. No cranial nerve deficit. Coordination normal.  Skin: Skin is warm and dry. No rash noted. She is not diaphoretic. No erythema. No pallor.  Psychiatric: She has a normal mood and affect. Her behavior is normal. Judgment and thought content normal.  Nursing note and vitals reviewed.     Assessment & Plan:  1. Routine general medical examination at a health care facility - Reviewed labs in detail with patient and all questions answered.  - Continue to work on diet and exercise - Follow up in one year or sooner of needed  2. Colon cancer screening - Ambulatory referral to Gastroenterology  3.  Essential hypertension, benign - at goal  - No change in medications   4. Hyperlipidemia, unspecified hyperlipidemia type - Well controlled.  - No change in medications   5. Acute cystitis without hematuria  - nitrofurantoin, macrocrystal-monohydrate, (MACROBID) 100 MG capsule; Take 1 capsule (100 mg total) by mouth 2 (two) times daily.  Dispense: 10 capsule; Refill: 0   Kristin Peng, Kristin Merritt

## 2016-07-14 ENCOUNTER — Ambulatory Visit (INDEPENDENT_AMBULATORY_CARE_PROVIDER_SITE_OTHER): Payer: Medicare Other | Admitting: Gynecology

## 2016-07-14 ENCOUNTER — Ambulatory Visit: Payer: Medicare Other | Attending: Gynecologic Oncology | Admitting: Gynecologic Oncology

## 2016-07-14 ENCOUNTER — Encounter: Payer: Self-pay | Admitting: Gynecologic Oncology

## 2016-07-14 ENCOUNTER — Encounter: Payer: Self-pay | Admitting: Gynecology

## 2016-07-14 VITALS — BP 130/80 | Ht 66.0 in | Wt 206.0 lb

## 2016-07-14 VITALS — BP 130/80 | HR 66 | Temp 99.0°F | Resp 18 | Wt 207.2 lb

## 2016-07-14 DIAGNOSIS — Z01411 Encounter for gynecological examination (general) (routine) with abnormal findings: Secondary | ICD-10-CM

## 2016-07-14 DIAGNOSIS — D071 Carcinoma in situ of vulva: Secondary | ICD-10-CM | POA: Diagnosis not present

## 2016-07-14 DIAGNOSIS — F17200 Nicotine dependence, unspecified, uncomplicated: Secondary | ICD-10-CM

## 2016-07-14 DIAGNOSIS — Z87891 Personal history of nicotine dependence: Secondary | ICD-10-CM | POA: Insufficient documentation

## 2016-07-14 DIAGNOSIS — J45909 Unspecified asthma, uncomplicated: Secondary | ICD-10-CM | POA: Insufficient documentation

## 2016-07-14 DIAGNOSIS — I10 Essential (primary) hypertension: Secondary | ICD-10-CM | POA: Diagnosis not present

## 2016-07-14 DIAGNOSIS — Z8542 Personal history of malignant neoplasm of other parts of uterus: Secondary | ICD-10-CM | POA: Insufficient documentation

## 2016-07-14 DIAGNOSIS — R8290 Unspecified abnormal findings in urine: Secondary | ICD-10-CM

## 2016-07-14 DIAGNOSIS — K219 Gastro-esophageal reflux disease without esophagitis: Secondary | ICD-10-CM | POA: Diagnosis not present

## 2016-07-14 DIAGNOSIS — F419 Anxiety disorder, unspecified: Secondary | ICD-10-CM | POA: Diagnosis not present

## 2016-07-14 DIAGNOSIS — C519 Malignant neoplasm of vulva, unspecified: Secondary | ICD-10-CM | POA: Diagnosis present

## 2016-07-14 DIAGNOSIS — E7800 Pure hypercholesterolemia, unspecified: Secondary | ICD-10-CM | POA: Insufficient documentation

## 2016-07-14 DIAGNOSIS — Z8541 Personal history of malignant neoplasm of cervix uteri: Secondary | ICD-10-CM | POA: Diagnosis not present

## 2016-07-14 DIAGNOSIS — F17210 Nicotine dependence, cigarettes, uncomplicated: Secondary | ICD-10-CM | POA: Diagnosis not present

## 2016-07-14 DIAGNOSIS — F329 Major depressive disorder, single episode, unspecified: Secondary | ICD-10-CM | POA: Diagnosis not present

## 2016-07-14 DIAGNOSIS — Z90710 Acquired absence of both cervix and uterus: Secondary | ICD-10-CM | POA: Insufficient documentation

## 2016-07-14 LAB — URINALYSIS W MICROSCOPIC + REFLEX CULTURE
Bilirubin Urine: NEGATIVE
CRYSTALS: NONE SEEN [HPF]
Casts: NONE SEEN [LPF]
GLUCOSE, UA: NEGATIVE
Hgb urine dipstick: NEGATIVE
Ketones, ur: NEGATIVE
Nitrite: NEGATIVE
PH: 6 (ref 5.0–8.0)
PROTEIN: NEGATIVE
RBC / HPF: NONE SEEN RBC/HPF (ref <=2)
Specific Gravity, Urine: 1.015 (ref 1.001–1.035)
Yeast: NONE SEEN [HPF]

## 2016-07-14 NOTE — Progress Notes (Signed)
Follow-up Note: Gyn-Onc  Consult was initially requested by Dr. Toney Rakes for the evaluation of Kristin Merritt 56 y.o. female  CC:  Chief Complaint  Patient presents with  . Vulvar Cancer    Assessment/Plan:  Kristin. Kristin Merritt  is a 56 y.o.  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 Has colposcopic findings consistent with either metaplasia vs dysplasia on the posterior left labia minora and some changes to the posterior introitus.  Follow-up today's bx. If cancer - needs resection. If dysplasia- recommend laser. If normal - follow-up inspection in June 2018.  HPI: Kristin Merritt is a 56 year old G1P1 who is seen in consultation at the request of Dr Toney Rakes for [facility] and possible invasive carcinoma of the vulva. The patient has a history of VIN3 and lichen sclerosis diagnosed in 2008 in [place]. She did not seek treatment for this because she had many life changes and pressures at the time. She relocated to [facility] in 2014 at which time she saw Dr Toney Rakes to establish care. At a routine visit he noted an area on her perineal body which she told him she had been treating with a cream (presumably steroid) and he represcribed the same. In 2017 it was persistent and therefore he biopsied the area to determine if it was dysplasia or lichen sclerosis. The pathology returned VIN3. This prompted her to go to the OR with Dr Toney Rakes on 11/18/15 for a wide local excision of the perinenal body (and left thigh excision). 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 There was no comment regarding margin status or depth of invasion on the pathology report. The pathology comments that there are "2 small foci of squamous cells present at the superficial dermis. It is difficult to determine whether these represent tangential section of the in situ vulva carcinoma or represent associated focal  infiltrating squamous cell carcinoma. Multiple deeper sections have been examined."   Since the surgery the patient has experienced wound separation and bleeding.   She reports a history of tobacco use and prior cervical cancer (possibly dysplasia) which was treated with a vaginal hysterectomy.  Interval Hx: The patient has been feeling well with no pruritis, nodules or discomfort.  Current Meds:  Outpatient Encounter Prescriptions as of 07/14/2016  Medication Sig  . albuterol (PROVENTIL HFA;VENTOLIN HFA) 108 (90 BASE) MCG/ACT inhaler Inhale 2 puffs into the lungs every 6 (six) hours as needed.  Marland Kitchen atorvastatin (LIPITOR) 20 MG tablet TAKE 1 TABLET BY MOUTH EVERY DAY  . cholecalciferol (VITAMIN D) 1000 UNITS tablet Take 1,000 Units by mouth daily.  . diazepam (VALIUM) 5 MG tablet Take 5 mg by mouth.  . docusate sodium (COLACE) 50 MG capsule Take by mouth as needed for mild constipation or moderate constipation. Over the counter Equate stool softener  . estradiol (ESTRACE) 1 MG tablet Take 1 tablet (1 mg total) by mouth daily.  . fluticasone (FLONASE) 50 MCG/ACT nasal spray Place 2 sprays into both nostrils daily.  . Fluticasone-Salmeterol (ADVAIR) 250-50 MCG/DOSE AEPB Inhale 1 puff into the lungs as needed.  Marland Kitchen KLOR-CON M20 20 MEQ tablet TAKE 1 TABLET BY MOUTH EVERY DAY  . lansoprazole (PREVACID) 30 MG capsule TAKE 1 CAPSULE BY MOUTH ONCE DAILY  . levocetirizine (XYZAL) 5 MG tablet Take 5 mg by mouth every evening.   Marland Kitchen losartan-hydrochlorothiazide (HYZAAR) 50-12.5 MG tablet TAKE 1 TABLET BY MOUTH DAILY.  . montelukast (SINGULAIR)  10 MG tablet Take 10 mg by mouth at bedtime.  Marland Kitchen EPIPEN 2-PAK 0.3 MG/0.3ML SOAJ injection    No facility-administered encounter medications on file as of 07/14/2016.     Allergy:  Allergies  Allergen Reactions  . Percocet [Oxycodone-Acetaminophen] Itching    Social Hx:   Social History   Social History  . Marital status: Widowed    Spouse name: N/A  .  Number of children: N/A  . Years of education: N/A   Occupational History  . Not on file.   Social History Main Topics  . Smoking status: Current Some Day Smoker    Packs/day: 0.25    Years: 20.00    Types: Cigarettes  . Smokeless tobacco: Never Used  . Alcohol use 0.0 oz/week     Comment: rare  . Drug use: No  . Sexual activity: Yes    Birth control/ protection: Condom, Surgical   Other Topics Concern  . 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 Enjoys walking   Diet: Tries to watch what she eats. Does not eat a lot of processed foods or fast food.     Past Surgical Hx:  Past Surgical History:  Procedure Laterality Date  . EYE SURGERY     "Lazy Muscle"  . SPINE SURGERY     L5-s1  . VAGINAL HYSTERECTOMY    . VULVA /PERINEUM BIOPSY N/A 11/18/2015   Procedure: WIDE LOCAL EXCISION OF VULVA and MEDIAL LEFT THIGH LESION ;  Surgeon: Terrance Mass, MD;  Location: [facility];  Service: Gynecology;  Laterality: N/A;  . WISDOM TOOTH EXTRACTION      Past Medical Hx:  Past Medical History:  Diagnosis Date  . Allergy   . Anxiety   . Asthma   . Depression   . GERD (gastroesophageal reflux disease)   . Heart murmur   . High cholesterol   . Hypertension   . Leg cramps   . Lichen sclerosus et atrophicus   . Numbness and tingling in hands   . Postmenopausal HRT (hormone replacement therapy) - followed by Dr. Toney Rakes in gyn 07/11/2012  . Swelling of both hands   . Uterine cancer ([place]) 1991  . UTI (urinary tract infection)   . 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 VIN III (vulvar intraepithelial neoplasia III)     Past Gynecological History:  Dysplasia/cancer of the cervix, VIN3, lichen sclerosis.  No LMP recorded. Patient has had a hysterectomy.  Family Hx:  Family History  Problem  Relation Age of Onset  . Cancer Mother     LUNG   . Breast cancer Mother     Died    Review of Systems:  Constitutional  Feels well,    ENT Normal appearing ears and nares bilaterally Skin/Breast  No rash, sores, jaundice, itching, dryness Cardiovascular  No chest pain, shortness of breath, or edema  Pulmonary  No cough or wheeze.  Gastro Intestinal  No nausea, vomitting, or diarrhoea. No bright red blood per rectum, no abdominal pain, change in bowel movement, or constipation.  Genito Urinary  No frequency, urgency, dysuria, no pruritis Musculo Skeletal  No myalgia, arthralgia, joint swelling or pain  Neurologic  No weakness, numbness, change  in gait,  Psychology  No depression, anxiety, insomnia.   Vitals:  Blood pressure 130/80, pulse 66, temperature 99 F (37.2 C), temperature source Oral, resp. rate 18, weight 207 lb 3.2 oz (94 kg), SpO2 100 %.  Physical Exam: WD in NAD Neck  Supple NROM, without any enlargements.  Lymph Node Survey No cervical supraclavicular or inguinal adenopathy Cardiovascular  Pulse normal rate, regularity and rhythm. S1 and S2 normal.  Lungs  Clear to auscultation bilateraly, without wheezes/crackles/rhonchi. Good air movement.  Skin  No rash/lesions/breakdown  Psychiatry  Alert and oriented to person, place, and time  Abdomen  Normoactive bowel sounds, abdomen soft, non-tender and obese without evidence of hernia. Back No CVA tenderness Genito Urinary  Vulva/vagina: 5% acetic acid applied and an acetowhite area appeared on distal posterior left labia minora. Subtle acetowhite changes on posterior introitus but looks more consistent with metaplasia.   Bladder/urethra:  No lesions or masses, well supported bladder  Vagina: deferred  Cervix: surgically absent  Uterus: surgcially absent   Adnexa: deferred exam  Rectal  Anal hemorrhoid anteriorally Extremities  No bilateral cyanosis, clubbing or edema.  PROCEDURE: VULVAR  BIOPSY Informed consent (verbal) obtained Time out performed Betadine applied to posterior left vulva 1c of 2% lidocaine infiltrated into posterior left vulva 16mm punch taken of posterior left vulva/labia minora. Hemostasis achieved with silver nitrate. EBL: minimal Specimen: posterior left introitus Complications: none   Kristin Eva, MD  07/14/2016, 2:49 PM

## 2016-07-14 NOTE — Addendum Note (Signed)
Addended by: Burnett Kanaris on: 07/14/2016 09:35 AM   Modules accepted: Orders

## 2016-07-14 NOTE — Patient Instructions (Signed)
Dr. Serita Grit office will call you with the Biopsy results ~ 1 week. You have a follow up appointment on June 20,2018 at 2:30 pm.  Please arrive at 2:15 pm to check in.

## 2016-07-14 NOTE — Progress Notes (Signed)
Kristin Merritt May 11, 1961 [id number]   History:    56 y.o.  for annual gyn exam with the only complaint is of strong odor in her urine. She denies any dysuria or frequency or back pain or any fever or chills or nausea or vomiting. Patient recently had a tooth extraction had been placed on antibiotic subsequently developed a yeast infection and had been prescribed Diflucan by her PCP as well as Flagyl. Patient with no vaginal discharge. Patient has not had a colonoscopy as of yet. Her history is as follows:  Patient was seen for the first time as a new patient back in 2014 and then once again in 2015. She had moved to [facility] from [facility]. Patient had informed me that prior to that several years ago she had been living in [place] and had a vulvar biopsy which demonstrated VIN 3 for which I reviewed the report but patient did not return back for more definitive treatment since she had moved to [facility] she also been treated in the past for lichen sclerosus. She states that she had a transvaginal hysterectomy for early uterine cancer several years ago and never received any chemotherapy or radiation therapy. She reports her Pap smears since then have been normal we did 1 here a few months ago which was normal also. I was finally able to review the report that she brought with her from [place] whereby they reported the following that was dated 03/09/2007 from her vulvar biopsy:  Specimen source: A. Vulvar biopsy posterior fourchette, 6:00 B. Vulvar biopsy posterior fourchette 3:00  Diagnoses: A: vulvar posterior fourchette at 6:00 biopsy: Severe dysplasia VIN 3 B. 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  She was taken to the operating  room on November 18, 2015 where she underwent a wide local excision of the fourchette and excision of a left medial thigh lesion and the pathology report demonstrated the following:  Diagnosis 1. 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 Skin , left thigh LICHEN SCLEROSIS Microscopic Comment 1. There are two small foci squamous cells present at superficial dermis. It is difficult to determine whether these represent tangential section of in situ vulva carcinoma or represent associated focal infiltrating squamous cell carcinoma. Multiple deeper sections has been exam.  With these findings the patient had been referred to the GYN oncologist Dr. Denman George who she saw on November 2017 and had done a colposcopic evaluation of the year the fourchette and there was concern whether this was metaplasia versus dysplasia on the posterior left labia minora with some changes in the posterior introitus and she had recommended a biopsy but patient decided to wait until this month and she scheduled to see her later today.  Patient continues to smoke we discussed the detrimental effects of smoking. Her PCP has been doing her blood work and her vaccines are up-to-date. Patient with no vasomotor symptoms on no hormone replacement therapy.  Past medical history,surgical history, family history and social history were all reviewed and documented in the EPIC chart.  Gynecologic History No LMP recorded. Patient has had a hysterectomy. Contraception: status post hysterectomy Last Pap: 2017. Results were: normal Last mammogram: 2017. Results were: normal  Obstetric History OB History  Gravida Para Term Preterm AB Living  1 1       1   SAB TAB Ectopic Multiple Live Births               # Outcome Date GA Lbr Len/2nd Weight Sex Delivery Anes PTL Lv  1 Para                ROS: A ROS was performed and  pertinent positives and negatives are included in the history.  GENERAL: No fevers or chills. HEENT: No change in vision, no earache, sore throat or sinus congestion. NECK: No pain or stiffness. CARDIOVASCULAR: No chest pain or pressure. No palpitations. PULMONARY: No shortness of breath, cough or wheeze. GASTROINTESTINAL: No abdominal pain, nausea, vomiting or diarrhea, melena or bright red blood per rectum. GENITOURINARY: No urinary frequency, urgency, hesitancy or dysuria. MUSCULOSKELETAL: No joint or muscle pain, no back pain, no recent trauma. DERMATOLOGIC: No rash, no itching, no lesions. ENDOCRINE: No polyuria, polydipsia, no heat or cold intolerance. No recent change in weight. HEMATOLOGICAL: No anemia or easy bruising or bleeding. NEUROLOGIC: No headache, seizures, numbness, tingling or weakness. PSYCHIATRIC: No depression, no loss of interest in normal activity or change in sleep pattern.     Exam: chaperone present  BP 130/80   Ht 5\' 6"  (1.676 m)   Wt 206 lb (93.4 kg)   BMI 33.25 kg/m   Body mass index is 33.25 kg/m.  General appearance : Well developed well nourished female. No acute distress HEENT: Eyes: no retinal hemorrhage or exudates,  Neck supple, trachea midline, no carotid bruits, no thyroidmegaly Lungs: Clear to auscultation, no rhonchi or wheezes, or rib retractions  Heart: Regular rate and rhythm, no murmurs or gallops Breast:Examined in sitting and supine position were symmetrical in appearance, no palpable masses or tenderness,  no skin retraction, no nipple inversion, no nipple discharge, no skin discoloration, no axillary or supraclavicular lymphadenopathy Abdomen: no palpable masses or tenderness, no rebound or guarding Extremities: no edema or skin discoloration or tenderness  Pelvic: Fourchette area see above no colposcopic evaluation done today scheduled for this afternoon with GYN oncologist Dr. Awilda Bill, Urethra, Skene Glands: Within normal limits              Vagina: No gross lesions or discharge  Cervix: Absent  Uterus  absent  Adnexa  Without masses or tenderness  Anus and perineum  normal   Rectovaginal  normal sphincter tone without palpated masses or tenderness             Hemoccult recommended colonoscopy this year     Assessment/Plan:  56 y.o. 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 Scheduled for colposcopic evaluation by GYN oncologist this afternoon. A Pap smear with HPV screening was done today. She was provided with a requisition to schedule her mammogram for next month. She was also provided with name of gastroenterologist for to schedule her screening colonoscopy. Because of her complaint of foul odor urine a urinalysis was ordered. The remainder of her blood work was ordered by her PCP.   Terrance Mass MD, 9:19 AM 07/14/2016

## 2016-07-16 ENCOUNTER — Encounter: Payer: Self-pay | Admitting: Gastroenterology

## 2016-07-16 ENCOUNTER — Telehealth: Payer: Self-pay

## 2016-07-16 ENCOUNTER — Other Ambulatory Visit: Payer: Self-pay | Admitting: Gynecology

## 2016-07-16 ENCOUNTER — Encounter: Payer: Self-pay | Admitting: Adult Health

## 2016-07-16 DIAGNOSIS — Z1231 Encounter for screening mammogram for malignant neoplasm of breast: Secondary | ICD-10-CM

## 2016-07-16 DIAGNOSIS — N3000 Acute cystitis without hematuria: Secondary | ICD-10-CM

## 2016-07-16 LAB — PAP, TP IMAGING W/ HPV RNA, RFLX HPV TYPE 16,18/45: HPV mRNA, High Risk: NOT DETECTED

## 2016-07-16 LAB — URINE CULTURE

## 2016-07-16 MED ORDER — FLUCONAZOLE 150 MG PO TABS: ORAL_TABLET | ORAL | 0 refills | Status: DC

## 2016-07-16 MED ORDER — NITROFURANTOIN MONOHYD MACRO 100 MG PO CAPS: 100.0000 mg | ORAL_CAPSULE | Freq: Two times a day (BID) | ORAL | 0 refills | Status: DC

## 2016-07-16 NOTE — Telephone Encounter (Signed)
Patient wants to do TOC u/a after Macrobid because she said she took Macrobid a few weeks ago with PCP and she has concerns that the infection is not clearing. Order placed and she will come in 3 days to a week after finishing Rx. Instructed to call for lab appt.

## 2016-07-16 NOTE — Telephone Encounter (Signed)
That would be fine she could take 1 and then the other 1-3 days later if needed

## 2016-07-16 NOTE — Telephone Encounter (Signed)
rx sent. Patient informed.

## 2016-07-16 NOTE — Telephone Encounter (Signed)
Patient wants #2 Diflucan tabs as she was prescribed Macrobid earlier and she said antibiotics always cause her a yeast infection. Ok?

## 2016-07-29 ENCOUNTER — Ambulatory Visit (AMBULATORY_SURGERY_CENTER): Payer: Self-pay | Admitting: *Deleted

## 2016-07-29 VITALS — Ht 66.0 in | Wt 205.0 lb

## 2016-07-29 DIAGNOSIS — Z1210 Encounter for screening for malignant neoplasm of intestinal tract, unspecified: Secondary | ICD-10-CM

## 2016-07-29 MED ORDER — NA SULFATE-K SULFATE-MG SULF 17.5-3.13-1.6 GM/177ML PO SOLN: 1.0000 | Freq: Once | ORAL | 0 refills | Status: AC

## 2016-07-29 NOTE — Progress Notes (Signed)
No egg or soy allergy known to patient  No issues with past sedation with any surgeries  or procedures, no intubation problems  No diet pills per patient No home 02 use per patient  No blood thinners per patient  Pt states occasional  issues with constipation but not chronic and uses colace prn or veggies and fruits and makes stools soft  No A fib or A flutter  emmi video to e mail

## 2016-07-30 ENCOUNTER — Telehealth: Payer: Self-pay | Admitting: *Deleted

## 2016-07-30 NOTE — Telephone Encounter (Signed)
Per MD, notified pt of Pathology results discussed with pt she will not need surgery however, MD would like her to come in for colposcopy. Appt for 4/18 230pm given to pt. ( pt unable to come prior to this date)

## 2016-08-03 ENCOUNTER — Other Ambulatory Visit: Payer: Medicare Other

## 2016-08-03 DIAGNOSIS — N3000 Acute cystitis without hematuria: Secondary | ICD-10-CM

## 2016-08-04 LAB — URINALYSIS W MICROSCOPIC + REFLEX CULTURE
Bacteria, UA: NONE SEEN [HPF]
Bilirubin Urine: NEGATIVE
CASTS: NONE SEEN [LPF]
Crystals: NONE SEEN [HPF]
GLUCOSE, UA: NEGATIVE
HGB URINE DIPSTICK: NEGATIVE
Ketones, ur: NEGATIVE
Nitrite: NEGATIVE
PH: 6.5 (ref 5.0–8.0)
Protein, ur: NEGATIVE
Specific Gravity, Urine: 1.01 (ref 1.001–1.035)
YEAST: NONE SEEN [HPF]

## 2016-08-06 ENCOUNTER — Telehealth: Payer: Self-pay

## 2016-08-06 ENCOUNTER — Telehealth: Payer: Self-pay | Admitting: *Deleted

## 2016-08-06 DIAGNOSIS — R8271 Bacteriuria: Secondary | ICD-10-CM

## 2016-08-06 LAB — URINE CULTURE

## 2016-08-06 MED ORDER — FLUCONAZOLE 150 MG PO TABS: ORAL_TABLET | ORAL | 0 refills | Status: DC

## 2016-08-06 MED ORDER — LEVOFLOXACIN 750 MG PO TABS
750.0000 mg | ORAL_TABLET | Freq: Every day | ORAL | 0 refills | Status: DC
Start: 2016-08-06 — End: 2016-08-13

## 2016-08-06 NOTE — Telephone Encounter (Signed)
-----   Message from Kristin Mass, MD sent at 08/06/2016  9:54 AM EDT ----- Please inform patient she has a urinary tract infection please call in prescription for Macrobid one by mouth twice a day for 7 days

## 2016-08-06 NOTE — Telephone Encounter (Signed)
Patient informed. She wants to recheck a u/a after Rx to make sure this knocks out the bacteria completely.  Order placed. She also asked for 2 Diflucan as she said antibiotic always causes yeast infection. Rx sent with anitbiotic Rx.

## 2016-08-06 NOTE — Telephone Encounter (Signed)
Call in Levaquin 750 mg one PO QD for 5 days. Microorganism is sensitive to this antibiotic also.

## 2016-08-06 NOTE — Telephone Encounter (Signed)
Pt called requesting repeat urine results on 08/03/16, I explained to pt urine was sent out to main lab as it was a repeat, results may be back today or earlier next week, I told to call if she doesn't her from office to call.

## 2016-08-06 NOTE — Telephone Encounter (Signed)
I informed patient of this. She said less than a mos ago she took Macrobid for this same bacteria. She questions why she has it again. She said she was reading about it and it said she might need to take it longer than 7days if bacteria is persisting. She asked me to check with you about this.

## 2016-08-10 ENCOUNTER — Ambulatory Visit
Admission: RE | Admit: 2016-08-10 | Discharge: 2016-08-10 | Disposition: A | Discharge status: Home / Self Care | Payer: Medicare Other | Source: Ambulatory Visit | Attending: Gynecology | Admitting: Gynecology

## 2016-08-10 DIAGNOSIS — Z1231 Encounter for screening mammogram for malignant neoplasm of breast: Secondary | ICD-10-CM

## 2016-08-11 ENCOUNTER — Other Ambulatory Visit: Payer: Self-pay | Admitting: Gynecology

## 2016-08-11 DIAGNOSIS — R928 Other abnormal and inconclusive findings on diagnostic imaging of breast: Secondary | ICD-10-CM

## 2016-08-13 ENCOUNTER — Encounter: Payer: Self-pay | Admitting: Gastroenterology

## 2016-08-13 ENCOUNTER — Ambulatory Visit (AMBULATORY_SURGERY_CENTER): Payer: Medicare Other | Admitting: Gastroenterology

## 2016-08-13 VITALS — BP 134/81 | HR 75 | Temp 97.3°F | Resp 13 | Ht 66.0 in | Wt 205.0 lb

## 2016-08-13 DIAGNOSIS — Z1211 Encounter for screening for malignant neoplasm of colon: Secondary | ICD-10-CM | POA: Diagnosis present

## 2016-08-13 DIAGNOSIS — D125 Benign neoplasm of sigmoid colon: Secondary | ICD-10-CM

## 2016-08-13 DIAGNOSIS — K635 Polyp of colon: Secondary | ICD-10-CM | POA: Diagnosis not present

## 2016-08-13 DIAGNOSIS — Z1212 Encounter for screening for malignant neoplasm of rectum: Secondary | ICD-10-CM

## 2016-08-13 MED ORDER — SODIUM CHLORIDE 0.9 % IV SOLN: 500.0000 mL | INTRAVENOUS | Status: DC

## 2016-08-13 NOTE — Progress Notes (Signed)
To recovery, report to [place], RN, VSS 

## 2016-08-13 NOTE — Op Note (Signed)
[place] Patient Name: Kristin Merritt Procedure Date: 08/13/2016 2:48 PM MRN: [id number] Endoscopist: Mauri Pole , MD Age: 56 Referring MD:  Date of Birth: 11/24/1960 Gender: Female Account #: [id number] Procedure:                Colonoscopy Indications:              Screening for colorectal malignant neoplasm, This                            is the patient's first colonoscopy Medicines:                Monitored Anesthesia Care Procedure:                Pre-Anesthesia Assessment:                           - Prior to the procedure, a History and Physical                            was performed, and patient medications and                            allergies were reviewed. The patient's tolerance of                            previous anesthesia was also reviewed. The risks                            and benefits of the procedure and the sedation                            options and risks were discussed with the patient.                            All questions were answered, and informed consent                            was obtained. Prior Anticoagulants: The patient has                            taken no previous anticoagulant or antiplatelet                            agents. ASA Grade Assessment: II - A patient with                            mild systemic disease. After reviewing the risks                            and benefits, the patient was deemed in                            satisfactory condition to undergo the procedure.  After obtaining informed consent, the colonoscope                            was passed under direct vision. Throughout the                            procedure, the patient's blood pressure, pulse, and                            oxygen saturations were monitored continuously. The                            Colonoscope was introduced through the anus and                            advanced to the the  terminal ileum, with                            identification of the appendiceal orifice and IC                            valve. The colonoscopy was performed without                            difficulty. The patient tolerated the procedure                            well. The quality of the bowel preparation was                            excellent. The terminal ileum, ileocecal valve,                            appendiceal orifice, and rectum were photographed. Scope In: 2:51:37 PM Scope Out: 3:05:51 PM Scope Withdrawal Time: 0 hours 9 minutes 18 seconds  Total Procedure Duration: 0 hours 14 minutes 14 seconds  Findings:                 The perianal and digital rectal examinations were                            normal.                           Two sessile polyps were found in the recto-sigmoid                            colon. The polyps were 1 to 2 mm in size. These                            polyps were removed with a cold biopsy forceps.                            Resection and retrieval were complete.  Non-bleeding internal hemorrhoids were found during                            retroflexion. The hemorrhoids were small.                           The exam was otherwise without abnormality. Complications:            No immediate complications. Estimated Blood Loss:     Estimated blood loss was minimal. Impression:               - Two 1 to 2 mm polyps at the recto-sigmoid colon,                            removed with a cold biopsy forceps. Resected and                            retrieved.                           - Non-bleeding internal hemorrhoids.                           - The examination was otherwise normal. Recommendation:           - Patient has a contact number available for                            emergencies. The signs and symptoms of potential                            delayed complications were discussed with the                             patient. Return to normal activities tomorrow.                            Written discharge instructions were provided to the                            patient.                           - Resume previous diet.                           - Continue present medications.                           - Await pathology results.                           - Repeat colonoscopy in 5-10 years for surveillance                            based on pathology results.                           -  Return to GI clinic PRN. Mauri Pole, MD 08/13/2016 3:10:16 PM This report has been signed electronically.

## 2016-08-13 NOTE — Patient Instructions (Signed)
YOU HAD AN ENDOSCOPIC PROCEDURE TODAY AT [facility]:   Refer to the procedure report that was given to you for any specific questions about what was found during the examination.  If the procedure report does not answer your questions, please call your gastroenterologist to clarify.  If you requested that your care partner not be given the details of your procedure findings, then the procedure report has been included in a sealed envelope for you to review at your convenience later.  YOU SHOULD EXPECT: Some feelings of bloating in the abdomen. Passage of more gas than usual.  Walking can help get rid of the air that was put into your GI tract during the procedure and reduce the bloating. If you had a lower endoscopy (such as a colonoscopy or flexible sigmoidoscopy) you may notice spotting of blood in your stool or on the toilet paper. If you underwent a bowel prep for your procedure, you may not have a normal bowel movement for a few days.  Please Note:  You might notice some irritation and congestion in your nose or some drainage.  This is from the oxygen used during your procedure.  There is no need for concern and it should clear up in a day or so.  SYMPTOMS TO REPORT IMMEDIATELY:   Following lower endoscopy (colonoscopy or flexible sigmoidoscopy):  Excessive amounts of blood in the stool  Significant tenderness or worsening of abdominal pains  Swelling of the abdomen that is new, acute  Fever of 100F or higher    For urgent or emergent issues, a gastroenterologist can be reached at any hour by calling [phone].   DIET:  We do recommend a small meal at first, but then you may proceed to your regular diet.  Drink plenty of fluids but you should avoid alcoholic beverages for 24 hours.  ACTIVITY:  You should plan to take it easy for the rest of today and you should NOT DRIVE or use heavy machinery until tomorrow (because of the sedation medicines used during the test).     FOLLOW UP: Our staff will call the number listed on your records the next business day following your procedure to check on you and address any questions or concerns that you may have regarding the information given to you following your procedure. If we do not reach you, we will leave a message.  However, if you are feeling well and you are not experiencing any problems, there is no need to return our call.  We will assume that you have returned to your regular daily activities without incident.  If any biopsies were taken you will be contacted by phone or by letter within the next 1-3 weeks.  Please call us at [phone] if you have not heard about the biopsies in 3 weeks.    SIGNATURES/CONFIDENTIALITY: You and/or your care partner have signed paperwork which will be entered into your electronic medical record.  These signatures attest to the fact that that the information above on your After Visit Summary has been reviewed and is understood.  Full responsibility of the confidentiality of this discharge information lies with you and/or your care-partner.   INFORMATION ON POLYPS AND HEMORRHOIDS GIVEN TO YOU TODAY  AWAIT PATHOLOGY RESULTS

## 2016-08-13 NOTE — Progress Notes (Signed)
Called to room to assist during endoscopic procedure.  Patient ID and intended procedure confirmed with present staff. Received instructions for my participation in the procedure from the performing physician.  

## 2016-08-13 NOTE — Progress Notes (Signed)
Pt's states no medical or surgical changes since previsit or office visit. 

## 2016-08-16 ENCOUNTER — Ambulatory Visit
Admission: RE | Admit: 2016-08-16 | Discharge: 2016-08-16 | Disposition: A | Discharge status: Home / Self Care | Payer: Medicare Other | Source: Ambulatory Visit | Attending: Gynecology | Admitting: Gynecology

## 2016-08-16 ENCOUNTER — Other Ambulatory Visit: Payer: Self-pay | Admitting: Gynecology

## 2016-08-16 ENCOUNTER — Telehealth: Payer: Self-pay

## 2016-08-16 DIAGNOSIS — R928 Other abnormal and inconclusive findings on diagnostic imaging of breast: Secondary | ICD-10-CM

## 2016-08-16 DIAGNOSIS — N6320 Unspecified lump in the left breast, unspecified quadrant: Secondary | ICD-10-CM

## 2016-08-16 NOTE — Telephone Encounter (Signed)
  Follow up Call-  Call back number 08/13/2016  Post procedure Call Back phone  # [phone]  Permission to leave phone message Yes  Some recent data might be hidden     Patient questions:  Do you have a fever, pain , or abdominal swelling? No. Pain Score  0 *  Have you tolerated food without any problems? Yes.    Have you been able to return to your normal activities? Yes.    Do you have any questions about your discharge instructions: Diet   No. Medications  No. Follow up visit  No.  Do you have questions or concerns about your Care? No.  Actions: * If pain score is 4 or above: No action needed, pain <4.  No problems per pt. maw

## 2016-08-19 ENCOUNTER — Other Ambulatory Visit: Payer: Self-pay | Admitting: Gynecology

## 2016-08-19 DIAGNOSIS — N6320 Unspecified lump in the left breast, unspecified quadrant: Secondary | ICD-10-CM

## 2016-08-20 ENCOUNTER — Ambulatory Visit
Admission: RE | Admit: 2016-08-20 | Discharge: 2016-08-20 | Disposition: A | Discharge status: Home / Self Care | Payer: Medicare Other | Source: Ambulatory Visit | Attending: Gynecology | Admitting: Gynecology

## 2016-08-20 DIAGNOSIS — N6320 Unspecified lump in the left breast, unspecified quadrant: Secondary | ICD-10-CM

## 2016-08-23 ENCOUNTER — Encounter: Payer: Self-pay | Admitting: Gastroenterology

## 2016-08-24 ENCOUNTER — Other Ambulatory Visit: Payer: Medicare Other

## 2016-08-24 DIAGNOSIS — R8271 Bacteriuria: Secondary | ICD-10-CM

## 2016-08-24 HISTORY — PX: BREAST EXCISIONAL BIOPSY: SUR124

## 2016-08-24 LAB — URINALYSIS W MICROSCOPIC + REFLEX CULTURE
BILIRUBIN URINE: NEGATIVE
CASTS: NONE SEEN [LPF]
CRYSTALS: NONE SEEN [HPF]
Glucose, UA: NEGATIVE
KETONES UR: NEGATIVE
Nitrite: NEGATIVE
PH: 6.5 (ref 5.0–8.0)
RBC / HPF: NONE SEEN RBC/HPF (ref <=2)
SPECIFIC GRAVITY, URINE: 1.015 (ref 1.001–1.035)
Yeast: NONE SEEN [HPF]

## 2016-08-26 ENCOUNTER — Other Ambulatory Visit: Payer: Self-pay | Admitting: *Deleted

## 2016-08-26 LAB — URINE CULTURE

## 2016-08-26 MED ORDER — NITROFURANTOIN MONOHYD MACRO 100 MG PO CAPS: 100.0000 mg | ORAL_CAPSULE | Freq: Two times a day (BID) | ORAL | 0 refills | Status: DC

## 2016-08-29 ENCOUNTER — Other Ambulatory Visit: Payer: Self-pay | Admitting: Gynecology

## 2016-08-29 ENCOUNTER — Other Ambulatory Visit: Payer: Self-pay | Admitting: Adult Health

## 2016-09-07 ENCOUNTER — Ambulatory Visit (INDEPENDENT_AMBULATORY_CARE_PROVIDER_SITE_OTHER): Payer: Medicare Other | Admitting: Adult Health

## 2016-09-07 ENCOUNTER — Encounter: Payer: Self-pay | Admitting: Adult Health

## 2016-09-07 VITALS — BP 132/90 | HR 68 | Temp 99.0°F | Ht 66.0 in | Wt 210.4 lb

## 2016-09-07 DIAGNOSIS — R309 Painful micturition, unspecified: Secondary | ICD-10-CM | POA: Diagnosis not present

## 2016-09-07 LAB — POC URINALSYSI DIPSTICK (AUTOMATED)
BILIRUBIN UA: NEGATIVE
Glucose, UA: NEGATIVE
KETONES UA: POSITIVE
NITRITE UA: NEGATIVE
PH UA: 6 (ref 5.0–8.0)
Protein, UA: NEGATIVE
RBC UA: NEGATIVE
Urobilinogen, UA: NEGATIVE E.U./dL — AB

## 2016-09-07 NOTE — Progress Notes (Signed)
Pre visit review using our clinic review tool, if applicable. No additional management support is needed unless otherwise documented below in the visit note. 

## 2016-09-07 NOTE — Progress Notes (Signed)
Follow-up Note: Gyn-Onc  Consult was initially requested by Dr. Toney Rakes for the evaluation of [name] 56 y.o. female  CC:  Chief Complaint  Patient presents with  . LGSIL    abnormal pap    Assessment/Plan:  Ms. Araseli Sherry  is a 56 y.o.  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 Has colposcopic findings consistent with either metaplasia vs dysplasia on the posterior left labia minora and some changes to the posterior introitus.  Follow-up today's colposcopic bx. Will direct vaginal treatment based on results (if VAIN I or II is found).  If normal - follow-up vulvar inspection in July 2018 and repeat pap with Dr Toney Rakes in February, 2019.  HPI: Ms Taralynn Quiett is a 56 year old G1P1 who is seen in consultation at the request of Dr Toney Rakes for [facility] and possible invasive carcinoma of the vulva. The patient has a history of VIN3 and lichen sclerosis diagnosed in 2008 in [place]. She did not seek treatment for this because she had many life changes and pressures at the time. She relocated to [facility] in 2014 at which time she saw Dr Toney Rakes to establish care. At a routine visit he noted an area on her perineal body which she told him she had been treating with a cream (presumably steroid) and he represcribed the same. In 2017 it was persistent and therefore he biopsied the area to determine if it was dysplasia or lichen sclerosis. The pathology returned VIN3. This prompted her to go to the OR with Dr Toney Rakes on 11/18/15 for a wide local excision of the perinenal body (and left thigh excision). 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 There was no comment regarding margin status or depth of invasion on the pathology report. The pathology comments that there are "2 small foci of squamous cells present at the superficial dermis. It is difficult to determine whether  these represent tangential section of the in situ vulva carcinoma or represent associated focal infiltrating squamous cell carcinoma. Multiple deeper sections have been examined."   Since the surgery the patient has experienced wound separation and bleeding.   She reports a history of tobacco use and prior cervical cancer (possibly dysplasia) which was treated with a vaginal hysterectomy.  Interval Hx: On 07/14/16 a pap showed LGSIL and was negative for high risk HPV. When I last saw [place] in February, 2018 a biopsy from the vulva (left distal posterior labia minora) was performed and was resulted as focal reactive but non dysplastic epithelium.   The patient has been feeling well with no pruritis, nodules or discomfort.  Current Meds:  Outpatient Encounter Prescriptions as of 09/08/2016  Medication Sig  . albuterol (PROVENTIL HFA;VENTOLIN HFA) 108 (90 BASE) MCG/ACT inhaler Inhale 2 puffs into the lungs every 6 (six) hours as needed.  Marland Kitchen aspirin 81 MG chewable tablet Chew 81 mg by mouth daily.  Marland Kitchen atorvastatin (LIPITOR) 20 MG tablet TAKE 1 TABLET BY MOUTH EVERY DAY  . cholecalciferol (VITAMIN D) 1000 UNITS tablet Take 1,000 Units by mouth daily.  Marland Kitchen CRANBERRY EXTRACT PO Take 1 tablet by mouth 2 (two) times daily.  Marland Kitchen docusate sodium (COLACE) 50 MG capsule Take by mouth as needed for mild constipation or moderate constipation. Over the counter Equate stool softener  . EPIPEN 2-PAK 0.3 MG/0.3ML SOAJ injection   . estradiol (ESTRACE) 1 MG tablet TAKE 1 TABLET (1 MG TOTAL) BY MOUTH DAILY.  Marland Kitchen  fluticasone (FLONASE) 50 MCG/ACT nasal spray Place 2 sprays into both nostrils daily.  . Fluticasone-Salmeterol (ADVAIR) 250-50 MCG/DOSE AEPB Inhale 1 puff into the lungs as needed.  Marland Kitchen KLOR-CON M20 20 MEQ tablet TAKE 1 TABLET BY MOUTH EVERY DAY  . lansoprazole (PREVACID) 30 MG capsule TAKE 1 CAPSULE BY MOUTH ONCE DAILY  . levocetirizine (XYZAL) 5 MG tablet Take 5 mg by mouth every evening.   Marland Kitchen  losartan-hydrochlorothiazide (HYZAAR) 50-12.5 MG tablet TAKE 1 TABLET BY MOUTH DAILY.  . Melatonin 5 MG TABS Take 1 tablet by mouth at bedtime.  . montelukast (SINGULAIR) 10 MG tablet Take 10 mg by mouth at bedtime.   Facility-Administered Encounter Medications as of 09/08/2016  Medication  . 0.9 %  sodium chloride infusion    Allergy:  Allergies  Allergen Reactions  . Percocet [Oxycodone-Acetaminophen] Itching    Social Hx:   Social History   Social History  . Marital status: Widowed    Spouse name: N/A  . Number of children: N/A  . Years of education: N/A   Occupational History  . Not on file.   Social History Main Topics  . Smoking status: Current Some Day Smoker    Packs/day: 0.25    Years: 20.00    Types: Cigarettes  . Smokeless tobacco: Never Used     Comment: 4 a day   . Alcohol use 0.0 oz/week     Comment: rare  . Drug use: No  . Sexual activity: Yes    Birth control/ protection: Condom, Surgical   Other Topics Concern  . 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 Enjoys walking   Diet: Tries to watch what she eats. Does not eat a lot of processed foods or fast food.     Past Surgical Hx:  Past Surgical History:  Procedure Laterality Date  . EYE SURGERY     "Lazy Muscle" x 2- one as child and another 86  . SPINE SURGERY     L5-s1  . VAGINAL HYSTERECTOMY    . VULVA /PERINEUM BIOPSY N/A 11/18/2015   Procedure: WIDE LOCAL EXCISION OF VULVA and MEDIAL LEFT THIGH LESION ;  Surgeon: Terrance Mass, MD;  Location: [facility];  Service: Gynecology;  Laterality: N/A;  . WISDOM TOOTH EXTRACTION      Past Medical Hx:  Past Medical History:  Diagnosis Date  . Allergy   . Anemia    as child  . Anxiety   . Asthma   . Constipation   . Depression   . GERD (gastroesophageal reflux disease)   . Heart  murmur   . High cholesterol   . Hypertension   . Leg cramps   . Lichen sclerosus et atrophicus   . Numbness and tingling in hands   . Postmenopausal HRT (hormone replacement therapy) - followed by Dr. Toney Rakes in gyn 07/11/2012  . Swelling of both hands   . Uterine cancer (Real) 1991  . UTI (urinary tract infection)   . 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 VIN III (vulvar intraepithelial neoplasia III)     Past Gynecological History:  Dysplasia/cancer of the cervix, VIN3, lichen sclerosis.  No LMP recorded. Patient has had a hysterectomy.  Family  Hx:  Family History  Problem Relation Age of Onset  . Cancer Mother     LUNG   . Breast cancer Mother     Died  . Esophageal cancer Mother   . Colon cancer Neg Hx   . Colon polyps Neg Hx   . Rectal cancer Neg Hx   . Stomach cancer Neg Hx     Review of Systems:  Constitutional  Feels well,    ENT Normal appearing ears and nares bilaterally Skin/Breast  No rash, sores, jaundice, itching, dryness Cardiovascular  No chest pain, shortness of breath, or edema  Pulmonary  No cough or wheeze.  Gastro Intestinal  No nausea, vomitting, or diarrhoea. No bright red blood per rectum, no abdominal pain, change in bowel movement, or constipation.  Genito Urinary  No frequency, urgency, dysuria, no pruritis Musculo Skeletal  No myalgia, arthralgia, joint swelling or pain  Neurologic  No weakness, numbness, change in gait,  Psychology  No depression, anxiety, insomnia.   Vitals:  Blood pressure (!) 152/91, pulse 72, resp. rate 18, weight 209 lb (94.8 kg).  Physical Exam: WD in NAD Neck  Supple NROM, without any enlargements.  Lymph Node Survey No cervical supraclavicular or inguinal adenopathy Cardiovascular  Pulse normal rate, regularity and rhythm. S1 and S2 normal.  Lungs  Clear to auscultation bilateraly, without wheezes/crackles/rhonchi. Good air movement.  Skin  No  rash/lesions/breakdown  Psychiatry  Alert and oriented to person, place, and time  Abdomen  Normoactive bowel sounds, abdomen soft, non-tender and obese without evidence of hernia. Back No CVA tenderness Genito Urinary  Vulva/vagina: 5% acetic acid applied and an acetowhite area appeared on distal posterior left labia minora. Subtle acetowhite changes on posterior introitus but looks more consistent with metaplasia.   Bladder/urethra:  No lesions or masses, well supported bladder  Vagina: see below.  Cervix: surgically absent  Uterus: surgcially absent   Adnexa: deferred exam  Rectal  Anal hemorrhoid anteriorally Extremities  No bilateral cyanosis, clubbing or edema.  PROCEDURE: Vaginal colposcopy and biopsy  Informed consent (verbal) obtained Time out performed Speculum placed and vagina visualized in its entirity. 4% acetic acid applied to entire vagina and colposcopic evaluation with magnification took place. Mild acetowhite changes seen at posterior vaginal cuff - biopsied.  Biopsy taken from posterior vaginal cuff using tisschler forceps.  Hemostasis achieved with silver nitrate. EBL: minimal Specimen: posterior vaginal cuff Complications: none   Donaciano Eva, MD  09/08/2016, 3:08 PM

## 2016-09-07 NOTE — Progress Notes (Signed)
Subjective:    Patient ID: Kristin Merritt, female    DOB: 02-Oct-1960, 56 y.o.   MRN: [id number]  HPI  56 year old female who presents to the office today for the complaint of dysuria.   I first treated her with Macrobid back in January 23rd 2018 for acute cystitis. She then saw her GYN a month later for the complaint of strong odor in her urine, it appears she was placed on Macrobid again?  She has been seeing her GYN for this issue in the past. It appears that she was placed on Macrobid at the end of February for UTI and then Levaquin 750 mg daily for 5 days in March for recurrent UTI.    Today in the office she reports that she continues to have a " tingling sensation all the time." he also reports that she has decreased stream and urgency .  She is using various body washes with fragrant in it    She has been using cranberry extract Azo without symptom relief.    Review of Systems See HPI   Past Medical History:  Diagnosis Date  . Allergy   . Anemia    as child  . Anxiety   . Asthma   . Constipation   . Depression   . GERD (gastroesophageal reflux disease)   . Heart murmur   . High cholesterol   . Hypertension   . Leg cramps   . Lichen sclerosus et atrophicus   . Numbness and tingling in hands   . Postmenopausal HRT (hormone replacement therapy) - followed by Dr. Toney Rakes in gyn 07/11/2012  . Swelling of both hands   . Uterine cancer ([place]) 1991  . UTI (urinary tract infection)   . 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 VIN III (vulvar intraepithelial neoplasia III)     Social History   Social History  . Marital status: Widowed    Spouse name: N/A  . Number of children: N/A  . Years of education: N/A   Occupational History  . Not on file.   Social History Main Topics  . Smoking status: Current Some Day Smoker    Packs/day: 0.25    Years: 20.00    Types: Cigarettes  . Smokeless tobacco: Never Used     Comment: 4 a day     . Alcohol use 0.0 oz/week     Comment: rare  . Drug use: No  . Sexual activity: Yes    Birth control/ protection: Condom, Surgical   Other Topics Concern  . 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 Enjoys walking   Diet: Tries to watch what she eats. Does not eat a lot of processed foods or fast food.     Past Surgical History:  Procedure Laterality Date  . EYE SURGERY     "Lazy Muscle" x 2- one as child and another 33  . SPINE SURGERY     L5-s1  . VAGINAL HYSTERECTOMY    . VULVA /PERINEUM BIOPSY N/A 11/18/2015   Procedure: WIDE LOCAL EXCISION OF VULVA and MEDIAL LEFT THIGH LESION ;  Surgeon: Terrance Mass, MD;  Location: [facility];  Service: Gynecology;  Laterality: N/A;  .  WISDOM TOOTH EXTRACTION      Family History  Problem Relation Age of Onset  . Cancer Mother     LUNG   . Breast cancer Mother     Died  . Esophageal cancer Mother   . Colon cancer Neg Hx   . Colon polyps Neg Hx   . Rectal cancer Neg Hx   . Stomach cancer Neg Hx     Allergies  Allergen Reactions  . Percocet [Oxycodone-Acetaminophen] Itching    Current Outpatient Prescriptions on File Prior to Visit  Medication Sig Dispense Refill  . albuterol (PROVENTIL HFA;VENTOLIN HFA) 108 (90 BASE) MCG/ACT inhaler Inhale 2 puffs into the lungs every 6 (six) hours as needed.    Marland Kitchen aspirin 81 MG chewable tablet Chew 81 mg by mouth daily.    Marland Kitchen atorvastatin (LIPITOR) 20 MG tablet TAKE 1 TABLET BY MOUTH EVERY DAY 90 tablet 3  . cholecalciferol (VITAMIN D) 1000 UNITS tablet Take 1,000 Units by mouth daily.    Marland Kitchen docusate sodium (COLACE) 50 MG capsule Take by mouth as needed for mild constipation or moderate constipation. Over the counter Equate stool softener    . EPIPEN 2-PAK 0.3 MG/0.3ML SOAJ injection     . estradiol (ESTRACE) 1 MG tablet TAKE 1 TABLET (1  MG TOTAL) BY MOUTH DAILY. 90 tablet 4  . fluticasone (FLONASE) 50 MCG/ACT nasal spray Place 2 sprays into both nostrils daily. 16 g 6  . Fluticasone-Salmeterol (ADVAIR) 250-50 MCG/DOSE AEPB Inhale 1 puff into the lungs as needed.    Marland Kitchen KLOR-CON M20 20 MEQ tablet TAKE 1 TABLET BY MOUTH EVERY DAY 90 tablet 1  . lansoprazole (PREVACID) 30 MG capsule TAKE 1 CAPSULE BY MOUTH ONCE DAILY 90 capsule 3  . levocetirizine (XYZAL) 5 MG tablet Take 5 mg by mouth every evening.     Marland Kitchen losartan-hydrochlorothiazide (HYZAAR) 50-12.5 MG tablet TAKE 1 TABLET BY MOUTH DAILY. 90 tablet 3  . Melatonin 5 MG TABS Take 1 tablet by mouth at bedtime.    . montelukast (SINGULAIR) 10 MG tablet Take 10 mg by mouth at bedtime.     Current Facility-Administered Medications on File Prior to Visit  Medication Dose Route Frequency Provider Last Rate Last Dose  . 0.9 %  sodium chloride infusion  500 mL Intravenous Continuous Kavitha Nandigam V, MD        BP 132/90 (BP Location: Left Arm, Patient Position: Sitting, Cuff Size: Large)   Pulse 68   Temp 99 F (37.2 C) (Oral)   Ht 5\' 6"  (1.676 m)   Wt 210 lb 6.4 oz (95.4 kg)   SpO2 98%   BMI 33.96 kg/m       Objective:   Physical Exam  Constitutional: She is oriented to person, place, and time. She appears well-developed and well-nourished. No distress.  Cardiovascular: Normal rate, regular rhythm, normal heart sounds and intact distal pulses.  Exam reveals no gallop and no friction rub.   No murmur heard. Pulmonary/Chest: Effort normal and breath sounds normal. No respiratory distress. She has no wheezes. She has no rales. She exhibits no tenderness.  Abdominal: Soft. Normal appearance and bowel sounds are normal. She exhibits no distension and no mass. There is no hepatosplenomegaly, splenomegaly or hepatomegaly. There is no tenderness. There is no rebound, no guarding and no CVA tenderness.  Neurological: She is alert and oriented to person, place, and time.  Skin: Skin  is warm and dry. No rash noted. She is not diaphoretic. No erythema. No  pallor.  Psychiatric: She has a normal mood and affect. Her behavior is normal. Judgment and thought content normal.  Nursing note and vitals reviewed.     Assessment & Plan:  1. Urinary pain - POCT Urinalysis Dipstick (Automated) - + 1 leuks - Urine culture - Will send culture. Will have her try Dove body wash in the meantime.  - Consider IC as possible cause  - Consider referral to Urology   Dorothyann Peng, NP

## 2016-09-08 ENCOUNTER — Encounter: Payer: Self-pay | Admitting: Gynecologic Oncology

## 2016-09-08 ENCOUNTER — Ambulatory Visit: Payer: Medicare Other | Attending: Gynecologic Oncology | Admitting: Gynecologic Oncology

## 2016-09-08 DIAGNOSIS — R87629 Unspecified abnormal cytological findings in specimens from vagina: Secondary | ICD-10-CM | POA: Insufficient documentation

## 2016-09-08 DIAGNOSIS — K219 Gastro-esophageal reflux disease without esophagitis: Secondary | ICD-10-CM | POA: Diagnosis not present

## 2016-09-08 DIAGNOSIS — Z8542 Personal history of malignant neoplasm of other parts of uterus: Secondary | ICD-10-CM | POA: Insufficient documentation

## 2016-09-08 DIAGNOSIS — R87622 Low grade squamous intraepithelial lesion on cytologic smear of vagina (LGSIL): Secondary | ICD-10-CM | POA: Insufficient documentation

## 2016-09-08 DIAGNOSIS — F419 Anxiety disorder, unspecified: Secondary | ICD-10-CM | POA: Insufficient documentation

## 2016-09-08 DIAGNOSIS — J45909 Unspecified asthma, uncomplicated: Secondary | ICD-10-CM | POA: Insufficient documentation

## 2016-09-08 DIAGNOSIS — I10 Essential (primary) hypertension: Secondary | ICD-10-CM | POA: Diagnosis not present

## 2016-09-08 DIAGNOSIS — F17210 Nicotine dependence, cigarettes, uncomplicated: Secondary | ICD-10-CM | POA: Diagnosis not present

## 2016-09-08 DIAGNOSIS — F329 Major depressive disorder, single episode, unspecified: Secondary | ICD-10-CM | POA: Diagnosis not present

## 2016-09-08 DIAGNOSIS — Z8544 Personal history of malignant neoplasm of other female genital organs: Secondary | ICD-10-CM

## 2016-09-08 DIAGNOSIS — E7800 Pure hypercholesterolemia, unspecified: Secondary | ICD-10-CM | POA: Diagnosis not present

## 2016-09-08 DIAGNOSIS — Z8371 Family history of colonic polyps: Secondary | ICD-10-CM | POA: Insufficient documentation

## 2016-09-08 DIAGNOSIS — Z7982 Long term (current) use of aspirin: Secondary | ICD-10-CM | POA: Insufficient documentation

## 2016-09-08 DIAGNOSIS — Z8541 Personal history of malignant neoplasm of cervix uteri: Secondary | ICD-10-CM | POA: Diagnosis not present

## 2016-09-08 NOTE — Patient Instructions (Signed)
You can follow-up with Dr Toney Rakes in 6 months for inspection of the vulva. Dr Serita Grit office will call you with the biopsy result from today's colposcopy.

## 2016-09-09 LAB — URINE CULTURE

## 2016-09-10 ENCOUNTER — Telehealth: Payer: Self-pay | Admitting: Adult Health

## 2016-09-10 DIAGNOSIS — N3000 Acute cystitis without hematuria: Secondary | ICD-10-CM

## 2016-09-10 MED ORDER — LEVOFLOXACIN 750 MG PO TABS
750.0000 mg | ORAL_TABLET | Freq: Every day | ORAL | 0 refills | Status: DC
Start: 2016-09-10 — End: 2016-12-07

## 2016-09-10 MED ORDER — FLUCONAZOLE 150 MG PO TABS: 150.0000 mg | ORAL_TABLET | Freq: Once | ORAL | 0 refills | Status: AC

## 2016-09-10 NOTE — Telephone Encounter (Signed)
Spoke with Kristin Merritt and informed her of her urine culture. I am going to prescribe another course of Levaquin and send her to Urology   She is ok with this plan

## 2016-09-20 ENCOUNTER — Telehealth: Payer: Self-pay

## 2016-09-20 NOTE — Telephone Encounter (Signed)
Called pt to let her know that per Dr.Rossi, biopsy was benign and that she will need to f/u in 1 yr with Dr.Fernandez. Pt felt very appreciative and happy that her results were okay. Pt states that she already has a 1 yr fu scheduled for Dr.Fernandez. No further questions at this time.

## 2016-09-21 ENCOUNTER — Telehealth: Payer: Self-pay | Admitting: Adult Health

## 2016-09-21 NOTE — Telephone Encounter (Signed)
° ° ° ° °  Pt said she can not get in to see the urologist until June and she is out of the medication and is asking if Tommi Rumps will continue to treat her until them.    levofloxacin (LEVAQUIN) 750 MG tablet     [facility]

## 2016-09-21 NOTE — Telephone Encounter (Signed)
It would be unsafe to treat her with an antibiotic for that long.   If she continues to have symptoms she can come in for repeat urinalysis

## 2016-09-21 NOTE — Telephone Encounter (Signed)
Please advise 

## 2016-09-21 NOTE — Telephone Encounter (Signed)
I notified patient of Cory's comments and patient verbalized understanding.  Patient states she will call back if she feels like her symptoms get worse, and get scheduled for an appt and a repeat urinalysis.

## 2016-10-06 ENCOUNTER — Encounter: Payer: Self-pay | Admitting: Gynecology

## 2016-10-06 ENCOUNTER — Ambulatory Visit (INDEPENDENT_AMBULATORY_CARE_PROVIDER_SITE_OTHER): Payer: Medicare Other

## 2016-10-06 DIAGNOSIS — Z111 Encounter for screening for respiratory tuberculosis: Secondary | ICD-10-CM

## 2016-10-08 ENCOUNTER — Encounter: Payer: Self-pay | Admitting: Family Medicine

## 2016-10-08 LAB — TB SKIN TEST: TB SKIN TEST: NEGATIVE

## 2016-10-14 MED ORDER — DEXAMETHASONE SODIUM PHOSPHATE 10 MG/ML IJ SOLN
INTRAMUSCULAR | Status: AC
  Filled 2016-10-14: qty 1

## 2016-10-14 MED ORDER — FENTANYL CITRATE (PF) 250 MCG/5ML IJ SOLN
INTRAMUSCULAR | Status: AC
  Filled 2016-10-14: qty 5

## 2016-10-14 MED ORDER — MIDAZOLAM HCL 2 MG/2ML IJ SOLN
INTRAMUSCULAR | Status: AC
  Filled 2016-10-14: qty 2

## 2016-10-14 MED ORDER — ONDANSETRON HCL 4 MG/2ML IJ SOLN
INTRAMUSCULAR | Status: AC
  Filled 2016-10-14: qty 2

## 2016-10-14 MED ORDER — KETOROLAC TROMETHAMINE 30 MG/ML IJ SOLN
INTRAMUSCULAR | Status: AC
  Filled 2016-10-14: qty 1

## 2016-10-14 MED ORDER — PROPOFOL 10 MG/ML IV BOLUS
INTRAVENOUS | Status: AC
  Filled 2016-10-14: qty 40

## 2016-10-14 MED ORDER — LIDOCAINE 2% (20 MG/ML) 5 ML SYRINGE
INTRAMUSCULAR | Status: AC
  Filled 2016-10-14: qty 5

## 2016-11-10 ENCOUNTER — Ambulatory Visit: Payer: Self-pay | Admitting: Gynecologic Oncology

## 2016-11-24 ENCOUNTER — Other Ambulatory Visit: Payer: Self-pay | Admitting: Adult Health

## 2016-11-24 DIAGNOSIS — I10 Essential (primary) hypertension: Secondary | ICD-10-CM

## 2016-11-26 ENCOUNTER — Other Ambulatory Visit: Payer: Self-pay

## 2016-11-26 ENCOUNTER — Telehealth: Payer: Self-pay | Admitting: Adult Health

## 2016-11-26 DIAGNOSIS — I10 Essential (primary) hypertension: Secondary | ICD-10-CM

## 2016-11-26 MED ORDER — LOSARTAN POTASSIUM-HCTZ 50-12.5 MG PO TABS: 1.0000 | ORAL_TABLET | Freq: Every day | ORAL | 3 refills | Status: DC

## 2016-11-26 NOTE — Telephone Encounter (Signed)
Pharmacy called for pt to request a refill of  losartan-hydrochlorothiazide (HYZAAR) 50-12.5 MG tablet  They say pt does not have enough.  Filled early in January, not sure why.  But they say she is out and can we send a refill?   [id number] [place], [place] [address] AT [address]

## 2016-11-26 NOTE — Telephone Encounter (Signed)
Rx has been refilled.  

## 2016-12-07 ENCOUNTER — Ambulatory Visit (INDEPENDENT_AMBULATORY_CARE_PROVIDER_SITE_OTHER): Payer: Medicare Other | Admitting: Gynecology

## 2016-12-07 ENCOUNTER — Encounter: Payer: Self-pay | Admitting: Gynecology

## 2016-12-07 VITALS — BP 124/80

## 2016-12-07 DIAGNOSIS — N951 Menopausal and female climacteric states: Secondary | ICD-10-CM

## 2016-12-07 DIAGNOSIS — Z79890 Hormone replacement therapy (postmenopausal): Secondary | ICD-10-CM

## 2016-12-07 DIAGNOSIS — D071 Carcinoma in situ of vulva: Secondary | ICD-10-CM | POA: Diagnosis not present

## 2016-12-07 NOTE — Progress Notes (Signed)
Patient is a 56 year old who presented to the office today because continuation of her hot flashes and vaginal dryness. Patient has been on Estrace 1 mg daily and wanted adjustment to her medication. Also she's here for follow-up because of her history of VIN III. she was seen in the GYN oncologist this April and had a detail colposcopic evaluation and had a biopsy of the vaginal cuff which was benign. She did not note any abnormality on the area where patient had a wide local excision of her vulva. Her history is as follows:  Patient was seen for the first time as a new patient back in 2014 and then once again in 2015. She had moved to [facility] from [facility]. Patient had informed me that prior to that several years ago she had been living in [place] and had a vulvar biopsy which demonstrated VIN 3 for which I reviewed the report but patient did not return back for more definitive treatment since she had moved to [facility] she also been treated in the past for lichen sclerosus. She states that she had a transvaginal hysterectomy for early uterine cancer several years ago and never received any chemotherapy or radiation therapy. She reports her Pap smears since then have been normal we did 1 here a few months ago which was normal also. I was finally able to review the report that she brought with her from [place] whereby they reported the following that was dated 03/09/2007 from her vulvar biopsy:  Specimen source: A. Vulvar biopsy posterior fourchette, 6:00 B. Vulvar biopsy posterior fourchette 3:00  Diagnoses: A: vulvar posterior fourchette at 6:00 biopsy: Severe dysplasia VIN 3 B. 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  She was taken  to the operating room on November 18, 2015 where she underwent a wide local excision of the fourchette and excision of a left medial thigh lesion and the pathology report demonstrated the following:  Diagnosis 1. 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 Skin , left thigh LICHEN SCLEROSIS Microscopic Comment 1. There are two small foci squamous cells present at superficial dermis. It is difficult to determine whether these represent tangential section of in situ vulva carcinoma or represent associated focal infiltrating squamous cell carcinoma. Multiple deeper sections has been exam.With these findings the patient had been referred to the GYN oncologist Dr. Denman George who she saw on November 2017 and had done a colposcopic evaluation of the year the fourchette and there was concern whether this was metaplasia versus dysplasia on the posterior left labia minora with some changes in the posterior introitus and she had recommended a biopsy.  See Dr. Denman George separate note. Recommendation last office visit and colposcopy with her in April was to repeat a Pap smear and colposcopy in 12 months which would be in 2019.  Exam: External genitalia was inspected with a magnifying lens no abnormality was noted external genitalia labia majora, labia minora, fourchette or perineal or perirectal region.  Assessment/plan: Vasomotor symptoms we'll adjust hormone replacement therapy as follows. Patient currently taking Estrace 1 mg daily have asked her to take 1 tablet in half of the second tablet and monitor symptoms for month of her hot flashes do not improve take to full  tablets which be equivalent to 2 mg daily. Of note patient with prior hysterectomy. Patient with history of VIN III will need follow-up Pap smear and colposcopy in 2019.

## 2016-12-09 ENCOUNTER — Telehealth: Payer: Self-pay | Admitting: *Deleted

## 2016-12-09 MED ORDER — ESTRADIOL 1 MG PO TABS: ORAL_TABLET | ORAL | 6 refills | Status: DC

## 2016-12-09 NOTE — Telephone Encounter (Signed)
Call in prescription for estradiol 1 mg 60 tablets with 6 refills. Tell patient to take 1 full tablet in half of another since this is what working well for her

## 2016-12-09 NOTE — Telephone Encounter (Signed)
Pt aware, Rx sent. 

## 2016-12-09 NOTE — Telephone Encounter (Signed)
Patient was told to try estradiol 1 1/2 half tablet daily x1 month and follow up, pt said she is going great on this dose and will need a new Rx for estradiol 1mg  take one and half tablets daily

## 2017-01-31 IMAGING — MR MR LUMBAR SPINE W/O CM
4 of 6 series · 25 of 48 positions shown · non-contrast
Comparison: None.

CLINICAL DATA: Unspecified back pain. L5-S1 herniated disc. Spinal
stenosis. Prior lumbar spine operation in 9993.

EXAM:
MRI LUMBAR SPINE WITHOUT CONTRAST
TECHNIQUE: Multiplanar, multisequence MR imaging of the lumbar spine was
performed. No intravenous contrast was administered.

[Series 3: T2 · sagittal · 4.0mm · 0.55mm/px · 6 of 13 slices shown (1 of 2)]
[im 1/13]
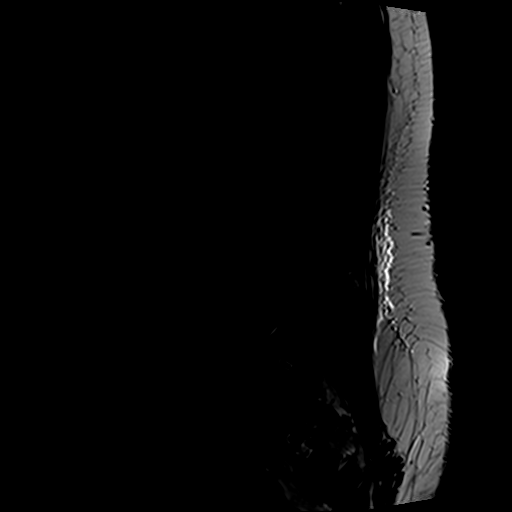
[im 3/13]
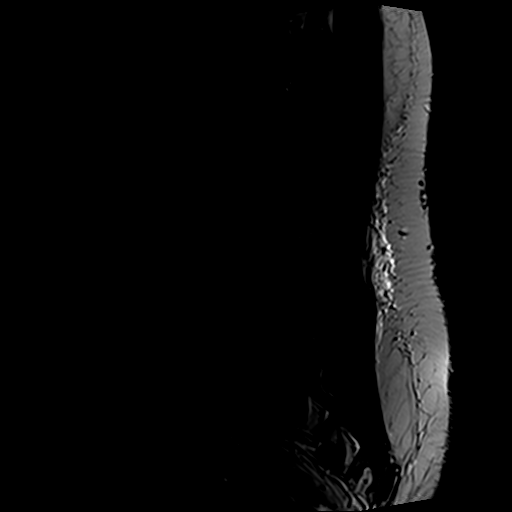
[im 5/13]
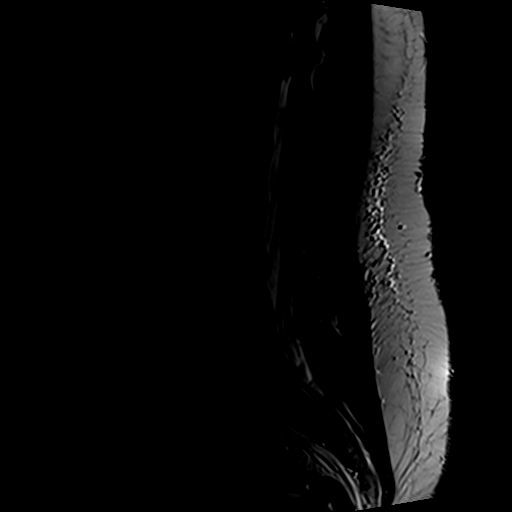
[im 8/13]
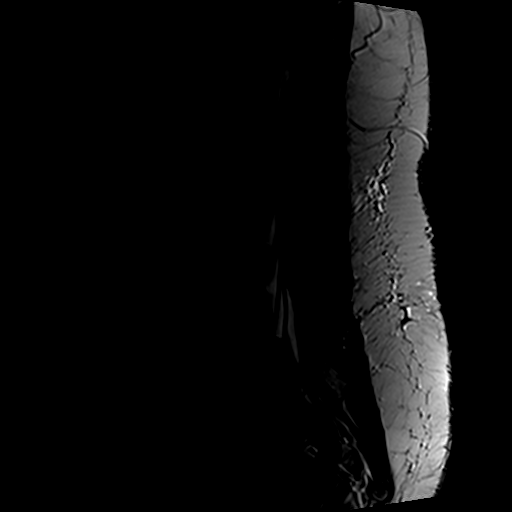
[im 10/13]
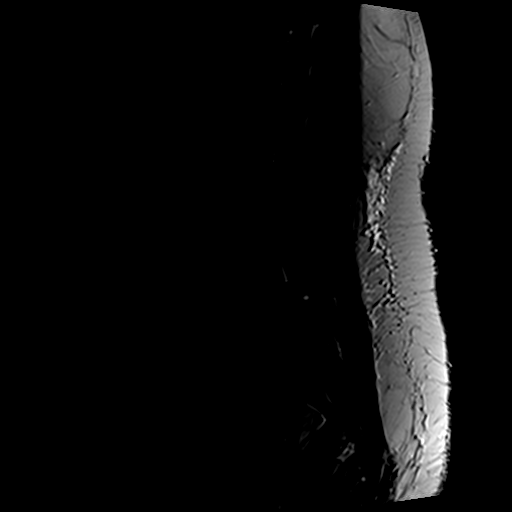
[im 13/13]
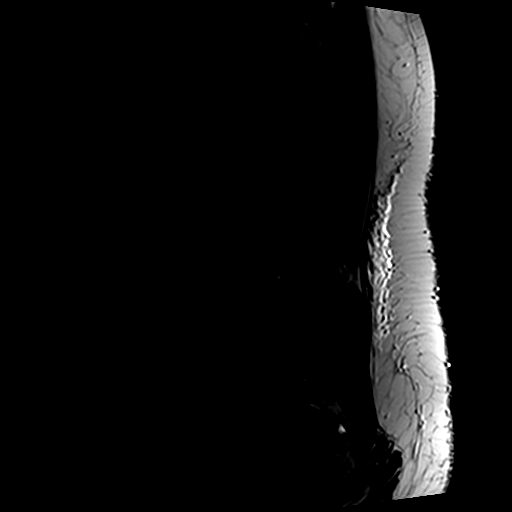

[Series 4: T1 · sagittal · 4.0mm · 0.55mm/px · 6 of 13 slices shown (1 of 2)]
[im 1/13]
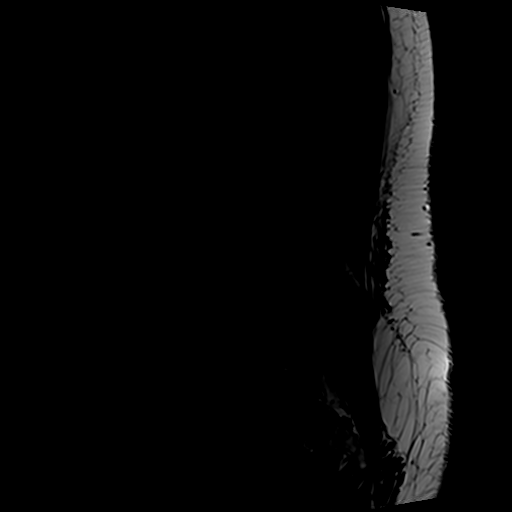
[im 3/13]
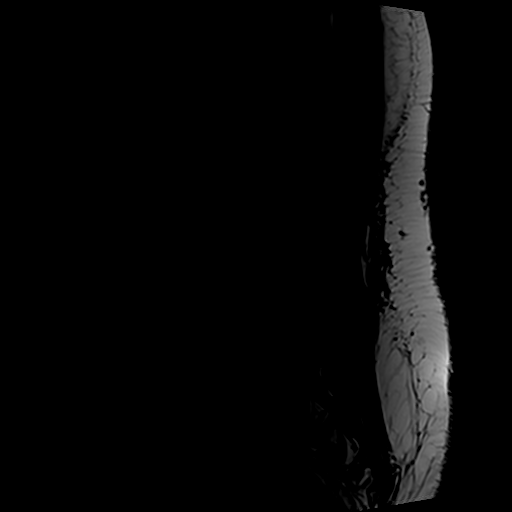
[im 5/13]
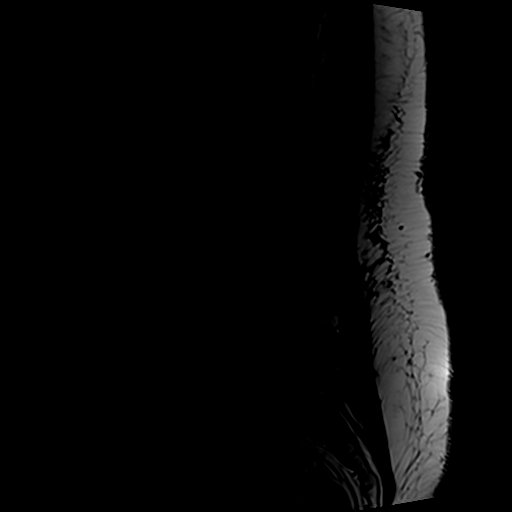
[im 8/13]
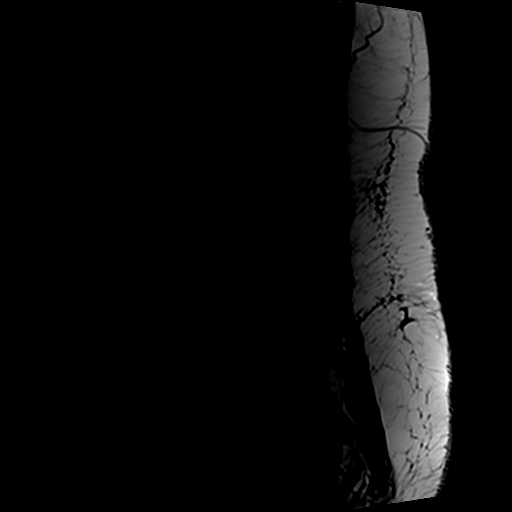
[im 10/13]
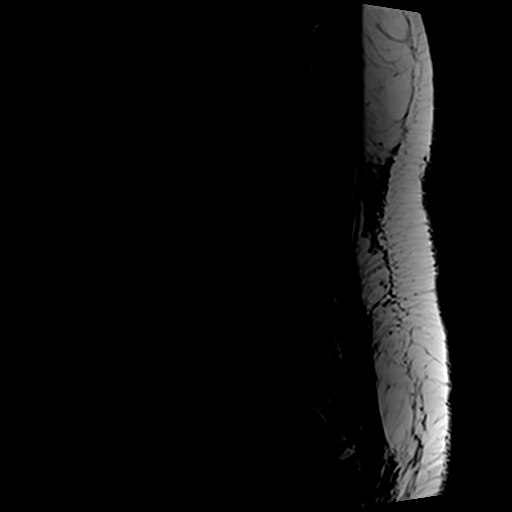
[im 13/13]
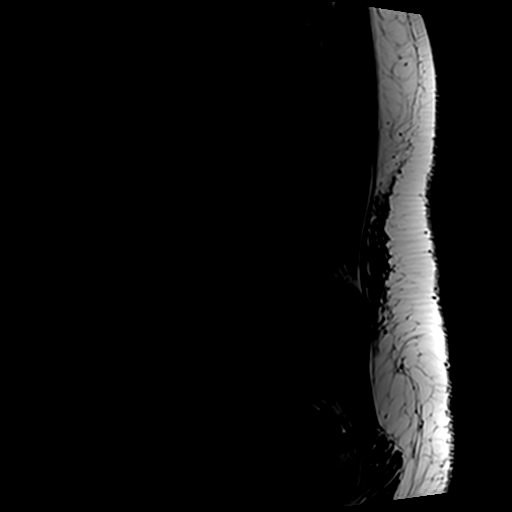

[Series 9: T1 · sagittal · 4.0mm · 0.55mm/px · 4 of 13 slices shown (2 of 2)]
[im 1/13]
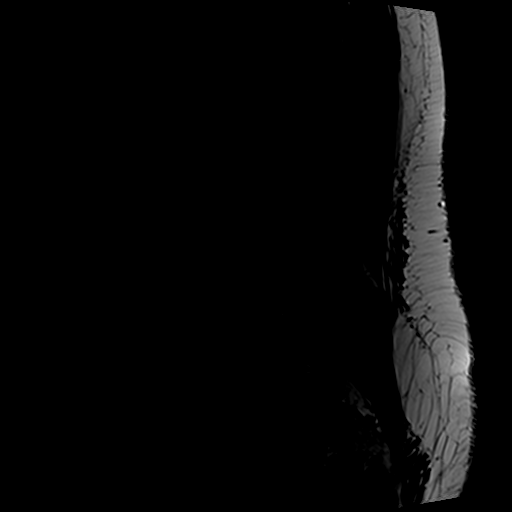
[im 4/13]
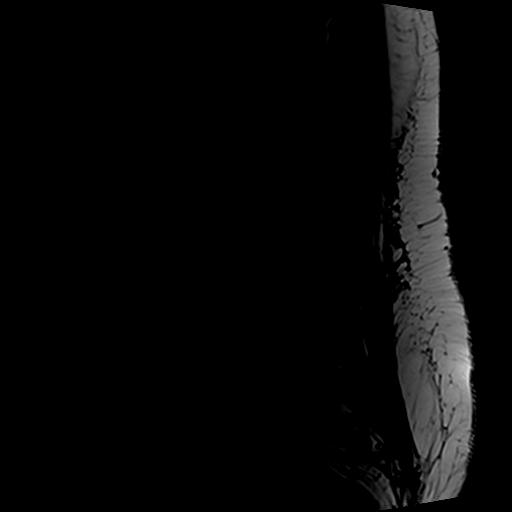
[im 7/13]
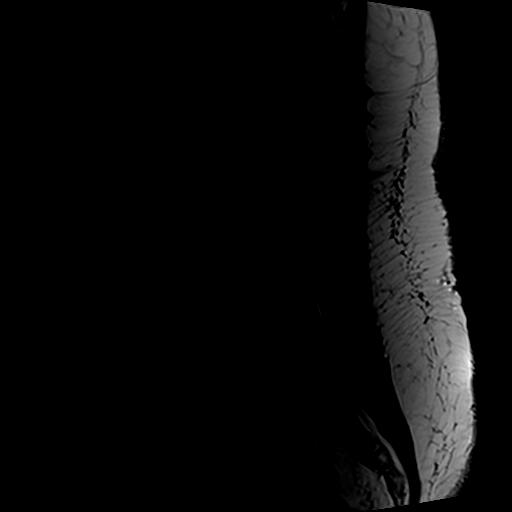
[im 13/13]
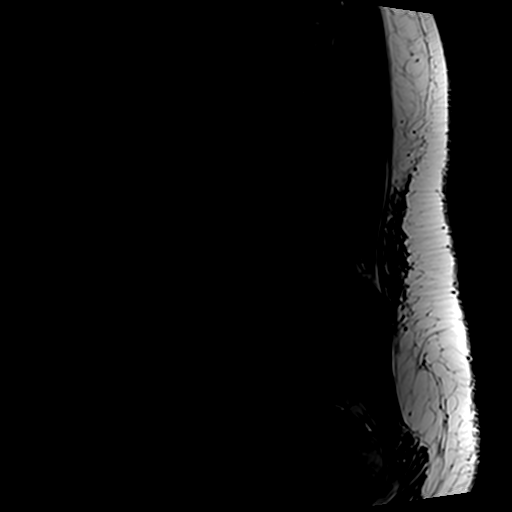

[Series 11: T2 · axial · 4.0mm · 0.70mm/px · z∈[-44,+135]mm · 9 of 32 slices shown (2 of 2)]
[im 1/32]
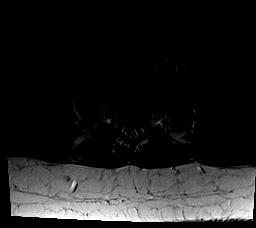
[im 6/32]
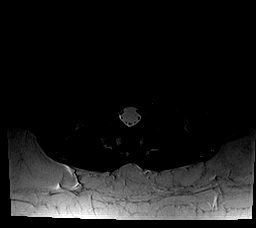
[im 11/32]
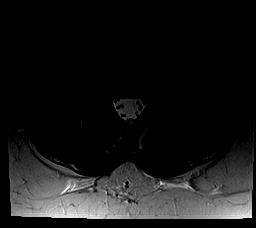
[im 13/32]
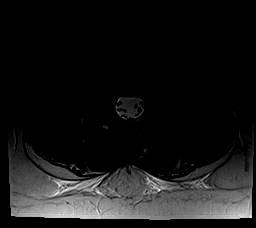
[im 16/32]
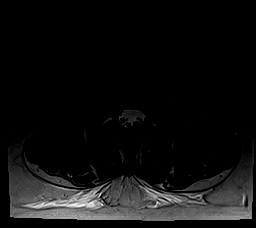
[im 19/32]
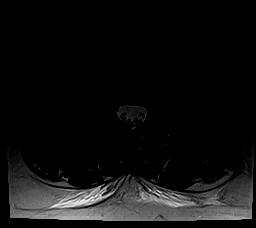
[im 21/32]
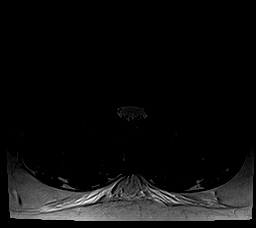
[im 26/32]
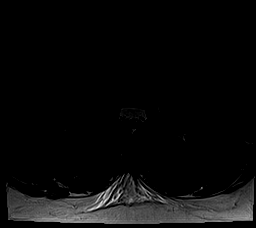
[im 32/32]
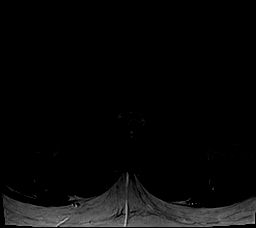

[25 of 48 positions shown; findings below may reference images not displayed]

FINDINGS: Segmentation: The numbering convention used for this exam termed
L5-S1 as the last intervertebral disc space.

Alignment: Mild levoconvex curve of the lumbar spine which may be
positional. The apex is at L4-L5.

Vertebrae: Vertebral body height is preserved. Degenerative endplate
changes at L5-S1. Bone marrow signal shows heterogenous marrow. This
is a nonspecific finding most commonly associated with obesity,
anemia, cigarette smoking or chronic disease.

Conus medullaris: Normal termination at T12-L1.

Paraspinal tissues: Normal.

Disc levels:

Disc Signal: Disc desiccation at L3-L4 and L5-S1. Normal appearance
of the other discs for age.

L1-L2:  Negative.

L2-L3:  Negative.

L3-L4: Mild distraction of the facet joints. Bilateral facet
degeneration, greater on the RIGHT than LEFT. High signal in the
facet joints likely represents facet effusion. Periarticular edema
is present on the RIGHT L3-L4 compatible with active facet
arthritis. The disc is degenerated with shallow posterior disc
bulging. Central canal and subarticular zones are patent. Neural
foramina appear adequately patent.

L4-L5: Mild disc degeneration and shallow posterior bulging. No
stenosis. Mild RIGHT facet arthrosis.

L5-S1: Disc desiccation and degeneration with degenerative endplate
changes. Findings compatible with old RIGHT laminotomy. Central
canal appears patent. Small low signal structure is present lateral
to the descending RIGHT S1 nerve (image 27 series 11) probably
representing scarring in this postoperative patient. There is no
compression of the descending S1 nerve root sleeve. The neural
foramina appear patent.
IMPRESSION: 1. L3-L4 RIGHT-greater-than-LEFT facet degeneration with RIGHT facet
arthritis. Shallow L3-L4 disc bulge without stenosis.
2. Postoperative changes of RIGHT L5 laminotomy. No recurrent
stenosis. Low signal adjacent to the descending RIGHT S1 nerve
probably represents scarring in the postoperative setting. Severe
L5-S1 degenerative disc disease.

## 2017-02-11 ENCOUNTER — Encounter: Payer: Self-pay | Admitting: Adult Health

## 2017-02-20 ENCOUNTER — Other Ambulatory Visit: Payer: Self-pay | Admitting: Adult Health

## 2017-02-22 NOTE — Telephone Encounter (Signed)
Sent to the pharmacy by e-scribe for 90 days.  Pt due for yearly 05/2017.

## 2017-03-09 ENCOUNTER — Ambulatory Visit (INDEPENDENT_AMBULATORY_CARE_PROVIDER_SITE_OTHER): Payer: Medicare Other | Admitting: *Deleted

## 2017-03-09 DIAGNOSIS — Z23 Encounter for immunization: Secondary | ICD-10-CM

## 2017-03-16 ENCOUNTER — Ambulatory Visit (INDEPENDENT_AMBULATORY_CARE_PROVIDER_SITE_OTHER): Payer: Medicare Other | Admitting: Adult Health

## 2017-03-16 ENCOUNTER — Encounter: Payer: Self-pay | Admitting: Adult Health

## 2017-03-16 VITALS — BP 140/80 | Temp 98.5°F | Wt 213.0 lb

## 2017-03-16 DIAGNOSIS — G5603 Carpal tunnel syndrome, bilateral upper limbs: Secondary | ICD-10-CM | POA: Diagnosis not present

## 2017-03-16 MED ORDER — PREDNISONE 20 MG PO TABS: 20.0000 mg | ORAL_TABLET | Freq: Every day | ORAL | 0 refills | Status: DC

## 2017-03-16 MED ORDER — ASPIRIN EC 81 MG PO TBEC: 81.0000 mg | DELAYED_RELEASE_TABLET | Freq: Every day | ORAL | 3 refills | Status: DC

## 2017-03-16 NOTE — Progress Notes (Signed)
Subjective:    Patient ID: Kristin Merritt, female    DOB: [birth year], 56 y.o.   MRN: [id number]  HPI  56 year old female who  has a past medical history of Allergy; Anemia; Anxiety; Asthma; Constipation; Depression; GERD (gastroesophageal reflux disease); Heart murmur; High cholesterol; Hypertension; Leg cramps; Lichen sclerosus et atrophicus; Numbness and tingling in hands; Postmenopausal HRT (hormone replacement therapy) - followed by Dr. Toney Rakes in gyn (07/11/2012); Swelling of both hands; Uterine cancer ([place]) (1991); UTI (urinary tract infection); VIN III (vulvar intraepithelial neoplasia III); and VIN III (vulvar intraepithelial neoplasia III).  She presents to the office today for bilateral carpal tunnel pain. Pain and numbness is worse in the first three fingers and happens more frequently at night. She has wrist splints but does not wear them. She has tried gabapentin in the past but did not feel as though this worked well for her carpal tunnel pain.  Review of Systems See HPI   Past Medical History:  Diagnosis Date  . Allergy   . Anemia    as child  . Anxiety   . Asthma   . Constipation   . Depression   . GERD (gastroesophageal reflux disease)   . Heart murmur   . High cholesterol   . Hypertension   . Leg cramps   . Lichen sclerosus et atrophicus   . Numbness and tingling in hands   . Postmenopausal HRT (hormone replacement therapy) - followed by Dr. Toney Rakes in gyn 07/11/2012  . Swelling of both hands   . Uterine cancer ([place]) 1991  . UTI (urinary tract infection)   . 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 VIN III (vulvar intraepithelial neoplasia III)     Social History   Social History  . Marital status: Widowed    Spouse name: N/A  . Number of children: N/A  . Years of education: N/A   Occupational History  . Not on file.   Social History Main Topics  . Smoking status: Current Some Day Smoker    Packs/day: 0.25   Years: 20.00    Types: Cigarettes  . Smokeless tobacco: Never Used     Comment: 4 a day   . Alcohol use 0.0 oz/week     Comment: rare  . Drug use: No  . Sexual activity: Yes    Birth control/ protection: Condom, Surgical   Other Topics Concern  . 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 Enjoys walking   Diet: Tries to watch what she eats. Does not eat a lot of processed foods or fast food.     Past Surgical History:  Procedure Laterality Date  . EYE SURGERY     "Lazy Muscle" x 2- one as child and another 46  . SPINE SURGERY     L5-s1  . VAGINAL HYSTERECTOMY    . VULVA /PERINEUM BIOPSY N/A 11/18/2015   Procedure: WIDE LOCAL EXCISION OF VULVA and MEDIAL LEFT THIGH LESION ;  Surgeon: Terrance Mass, MD;  Location: [facility];  Service: Gynecology;  Laterality: N/A;  . WISDOM TOOTH EXTRACTION      Family History  Problem Relation Age of Onset  . Cancer Mother  LUNG   . Breast cancer Mother        Died  . Esophageal cancer Mother   . Colon cancer Neg Hx   . Colon polyps Neg Hx   . Rectal cancer Neg Hx   . Stomach cancer Neg Hx     Allergies  Allergen Reactions  . Percocet [Oxycodone-Acetaminophen] Itching    Current Outpatient Prescriptions on File Prior to Visit  Medication Sig Dispense Refill  . albuterol (PROVENTIL HFA;VENTOLIN HFA) 108 (90 BASE) MCG/ACT inhaler Inhale 2 puffs into the lungs every 6 (six) hours as needed.    Marland Kitchen atorvastatin (LIPITOR) 20 MG tablet TAKE 1 TABLET BY MOUTH EVERY DAY 90 tablet 3  . docusate sodium (COLACE) 50 MG capsule Take by mouth as needed for mild constipation or moderate constipation. Over the counter Equate stool softener    . EPIPEN 2-PAK 0.3 MG/0.3ML SOAJ injection     . estradiol (ESTRACE) 1 MG tablet Take 1 1/2 tablet by mouth daily 60 tablet 6  . fluticasone  (FLONASE) 50 MCG/ACT nasal spray Place 2 sprays into both nostrils daily. 16 g 6  . Fluticasone-Salmeterol (ADVAIR) 250-50 MCG/DOSE AEPB Inhale 1 puff into the lungs as needed.    Marland Kitchen KLOR-CON M20 20 MEQ tablet TAKE 1 TABLET BY MOUTH EVERY DAY 90 tablet 0  . lansoprazole (PREVACID) 30 MG capsule TAKE 1 CAPSULE BY MOUTH ONCE DAILY 90 capsule 3  . levocetirizine (XYZAL) 5 MG tablet Take 5 mg by mouth every evening.     Marland Kitchen losartan-hydrochlorothiazide (HYZAAR) 50-12.5 MG tablet Take 1 tablet by mouth daily. 90 tablet 3  . Melatonin 5 MG TABS Take 1 tablet by mouth at bedtime.    . montelukast (SINGULAIR) 10 MG tablet Take 10 mg by mouth at bedtime.     Current Facility-Administered Medications on File Prior to Visit  Medication Dose Route Frequency Provider Last Rate Last Dose  . 0.9 %  sodium chloride infusion  500 mL Intravenous Continuous Nandigam, Kavitha V, MD        BP 140/80 (BP Location: Left Arm)   Temp 98.5 F (36.9 C) (Oral)   Wt 213 lb (96.6 kg)   BMI 34.38 kg/m       Objective:   Physical Exam  Constitutional: She is oriented to person, place, and time. She appears well-developed and well-nourished. No distress.  Musculoskeletal: Normal range of motion. She exhibits no edema, tenderness or deformity.  No decreased grip strength   Neurological: She is alert and oriented to person, place, and time.  Negative Tinel's signs/posiitve Phalen's test    Skin: Skin is warm and dry. No rash noted. She is not diaphoretic. No erythema. No pallor.  Psychiatric: She has a normal mood and affect. Her behavior is normal. Judgment and thought content normal.  Vitals reviewed.     Assessment & Plan:  1. Bilateral carpal tunnel syndrome - Advised to wear wrist splints. Take prednisone and follow up if no improved - predniSONE (DELTASONE) 20 MG tablet; Take 1 tablet (20 mg total) by mouth daily with breakfast.  Dispense: 14 tablet; Refill: 0 - Consider referral to sports medicine   Dorothyann Peng, NP

## 2017-03-18 ENCOUNTER — Other Ambulatory Visit: Payer: Self-pay | Admitting: Obstetrics & Gynecology

## 2017-03-18 DIAGNOSIS — N6082 Other benign mammary dysplasias of left breast: Secondary | ICD-10-CM

## 2017-03-29 ENCOUNTER — Inpatient Hospital Stay
Admission: RE | Admit: 2017-03-29 | Discharge: 2017-03-29 | Disposition: A | Discharge status: Home / Self Care | Payer: Self-pay | Source: Ambulatory Visit | Attending: Obstetrics & Gynecology | Admitting: Obstetrics & Gynecology

## 2017-04-22 ENCOUNTER — Telehealth: Payer: Self-pay | Admitting: *Deleted

## 2017-04-22 MED ORDER — ESTRADIOL 1 MG PO TABS: ORAL_TABLET | ORAL | 2 refills | Status: DC

## 2017-04-22 NOTE — Telephone Encounter (Signed)
Patient called requesting refill on estradiol 1 mg tablet, Rx sent.

## 2017-05-03 ENCOUNTER — Other Ambulatory Visit: Payer: Self-pay | Admitting: Adult Health

## 2017-05-04 ENCOUNTER — Ambulatory Visit
Admission: RE | Admit: 2017-05-04 | Discharge: 2017-05-04 | Disposition: A | Discharge status: Home / Self Care | Payer: Medicare Other | Source: Ambulatory Visit | Attending: Obstetrics & Gynecology | Admitting: Obstetrics & Gynecology

## 2017-05-04 DIAGNOSIS — N6082 Other benign mammary dysplasias of left breast: Secondary | ICD-10-CM

## 2017-05-04 NOTE — Telephone Encounter (Signed)
Sent to the pharmacy by e-scribe for 90 days.  Spoke to the pt and scheduled her for cpx on 06/08/17.

## 2017-05-24 ENCOUNTER — Other Ambulatory Visit: Payer: Self-pay | Admitting: Adult Health

## 2017-05-25 NOTE — Telephone Encounter (Signed)
Sent to the pharmacy by e-scribe.  Pt has upcoming cpx scheduled for 06/08/17.

## 2017-05-26 ENCOUNTER — Other Ambulatory Visit: Payer: Self-pay | Admitting: *Deleted

## 2017-05-26 NOTE — Telephone Encounter (Signed)
Per Dr.Fernandez note in Feb. 7847 "history of lichen sclerosus she's going to be prescribed clobetasol to apply twice a day for one week then twice a week or once a week thereafter"

## 2017-06-01 MED ORDER — CLOBETASOL PROPIONATE 0.05 % EX CREA: 1.0000 "application " | TOPICAL_CREAM | Freq: Two times a day (BID) | CUTANEOUS | 4 refills | Status: DC

## 2017-06-08 ENCOUNTER — Encounter: Payer: Self-pay | Admitting: Adult Health

## 2017-06-08 ENCOUNTER — Ambulatory Visit (INDEPENDENT_AMBULATORY_CARE_PROVIDER_SITE_OTHER): Payer: Medicare Other | Admitting: Adult Health

## 2017-06-08 VITALS — BP 138/86 | HR 65 | Temp 98.3°F | Resp 16 | Ht 66.0 in | Wt 215.8 lb

## 2017-06-08 DIAGNOSIS — Z0001 Encounter for general adult medical examination with abnormal findings: Secondary | ICD-10-CM | POA: Diagnosis not present

## 2017-06-08 DIAGNOSIS — G5603 Carpal tunnel syndrome, bilateral upper limbs: Secondary | ICD-10-CM

## 2017-06-08 DIAGNOSIS — Z0000 Encounter for general adult medical examination without abnormal findings: Secondary | ICD-10-CM

## 2017-06-08 DIAGNOSIS — E785 Hyperlipidemia, unspecified: Secondary | ICD-10-CM

## 2017-06-08 DIAGNOSIS — I10 Essential (primary) hypertension: Secondary | ICD-10-CM | POA: Diagnosis not present

## 2017-06-08 LAB — HEPATIC FUNCTION PANEL
ALK PHOS: 53 U/L (ref 39–117)
ALT: 19 U/L (ref 0–35)
AST: 19 U/L (ref 0–37)
Albumin: 4.2 g/dL (ref 3.5–5.2)
BILIRUBIN DIRECT: 0.1 mg/dL (ref 0.0–0.3)
BILIRUBIN TOTAL: 0.5 mg/dL (ref 0.2–1.2)
Total Protein: 6.7 g/dL (ref 6.0–8.3)

## 2017-06-08 LAB — LIPID PANEL
Cholesterol: 172 mg/dL (ref 0–200)
HDL: 87.9 mg/dL (ref >39.00)
LDL CALC: 66 mg/dL (ref 0–99)
NONHDL: 84.16
Total CHOL/HDL Ratio: 2
Triglycerides: 91 mg/dL (ref 0.0–149.0)
VLDL: 18.2 mg/dL (ref 0.0–40.0)

## 2017-06-08 LAB — CBC WITH DIFFERENTIAL/PLATELET
BASOS ABS: 0 10*3/uL (ref 0.0–0.1)
Basophils Relative: 0.6 % (ref 0.0–3.0)
EOS ABS: 0.1 10*3/uL (ref 0.0–0.7)
Eosinophils Relative: 1.2 % (ref 0.0–5.0)
HCT: 41.2 % (ref 36.0–46.0)
HEMOGLOBIN: 12.9 g/dL (ref 12.0–15.0)
LYMPHS ABS: 2.2 10*3/uL (ref 0.7–4.0)
Lymphocytes Relative: 31 % (ref 12.0–46.0)
MCHC: 31.4 g/dL (ref 30.0–36.0)
MCV: 83.5 fl (ref 78.0–100.0)
MONO ABS: 0.5 10*3/uL (ref 0.1–1.0)
Monocytes Relative: 6.8 % (ref 3.0–12.0)
NEUTROS PCT: 60.4 % (ref 43.0–77.0)
Neutro Abs: 4.3 10*3/uL (ref 1.4–7.7)
Platelets: 175 10*3/uL (ref 150.0–400.0)
RBC: 4.93 Mil/uL (ref 3.87–5.11)
RDW: 14.1 % (ref 11.5–15.5)
WBC: 7.1 10*3/uL (ref 4.0–10.5)

## 2017-06-08 LAB — BASIC METABOLIC PANEL
BUN: 12 mg/dL (ref 6–23)
CALCIUM: 9.3 mg/dL (ref 8.4–10.5)
CO2: 31 mEq/L (ref 19–32)
Chloride: 101 mEq/L (ref 96–112)
Creatinine, Ser: 0.89 mg/dL (ref 0.40–1.20)
GFR: 84.26 mL/min (ref >60.00)
GLUCOSE: 102 mg/dL — AB (ref 70–99)
POTASSIUM: 4.1 meq/L (ref 3.5–5.1)
Sodium: 141 mEq/L (ref 135–145)

## 2017-06-08 LAB — HEMOGLOBIN A1C: HEMOGLOBIN A1C: 6 % (ref 4.6–6.5)

## 2017-06-08 LAB — TSH: TSH: 0.74 u[IU]/mL (ref 0.35–4.50)

## 2017-06-08 NOTE — Progress Notes (Signed)
Subjective:    Patient ID: Kristin Merritt, female    DOB: 1960-07-06, 57 y.o.   MRN: [id number]  HPI  Patient presents for yearly preventative medicine examination. She is a pleasant 57 year old female who  has a past medical history of Allergy, Anemia, Anxiety, Asthma, Constipation, Depression, GERD (gastroesophageal reflux disease), Heart murmur, High cholesterol, Hypertension, Leg cramps, Lichen sclerosus et atrophicus, Numbness and tingling in hands, Postmenopausal HRT (hormone replacement therapy) - followed by Dr. Toney Rakes in gyn (07/11/2012), Swelling of both hands, Uterine cancer ([place]) (1991), UTI (urinary tract infection), VIN III (vulvar intraepithelial neoplasia III), and VIN III (vulvar intraepithelial neoplasia III).  She takes Lipitor for hyperlipidemia   She takes potassium 20 meq for hyperkalemia due to HCTZ   She takes Hyzaar 50-12.5 mg for hypertension.    All immunizations and health maintenance protocols were reviewed with the patient and needed orders were placed.  Appropriate screening laboratory values were ordered for the patient including screening of hyperlipidemia, renal function and hepatic function.  Medication reconciliation,  past medical history, social history, problem list and allergies were reviewed in detail with the patient  Goals were established with regard to weight loss, exercise, and  diet in compliance with medications. She has not been exercising on a routine basis but she has been eating healthier.   Wt Readings from Last 3 Encounters:  06/08/17 215 lb 12.8 oz (97.9 kg)  03/16/17 213 lb (96.6 kg)  09/08/16 209 lb (94.8 kg)   She has an appointment with her GYN for her pap. She had a mammogram in December. She is up to date on her colonoscopy   She continues to complain of bilateral carpal tunnel pain. She has been using wrist splints without relief. She has had an injection multiple years ago but does not remember how well it worked.    Review of Systems  Constitutional: Negative.   HENT: Negative.   Eyes: Negative.   Respiratory: Negative.   Cardiovascular: Negative.   Gastrointestinal: Negative.   Endocrine: Negative.   Genitourinary: Negative.   Skin: Negative.   Allergic/Immunologic: Negative.   Neurological: Positive for numbness.  Hematological: Negative.   Psychiatric/Behavioral: Negative.      Past Medical History:  Diagnosis Date  . Allergy   . Anemia    as child  . Anxiety   . Asthma   . Constipation   . Depression   . GERD (gastroesophageal reflux disease)   . Heart murmur   . High cholesterol   . Hypertension   . Leg cramps   . Lichen sclerosus et atrophicus   . Numbness and tingling in hands   . Postmenopausal HRT (hormone replacement therapy) - followed by Dr. Toney Rakes in gyn 07/11/2012  . Swelling of both hands   . Uterine cancer ([place]) 1991  . UTI (urinary tract infection)   . 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 VIN III (vulvar intraepithelial neoplasia III)     Social History   Socioeconomic History  . Marital status: Widowed    Spouse name: Not on file  . Number of children: Not on file  . Years of education: Not on file  . Highest education level: Not on file  Social Needs  . Financial resource strain: Not on file  . Food insecurity - worry: Not on file  . Food insecurity - inability: Not on file  . Transportation needs - medical: Not on file  . Transportation  needs - non-medical: Not on file  Occupational History  . Not on file  Tobacco Use  . Smoking status: Former Smoker    Packs/day: 0.25    Years: 20.00    Pack years: 5.00    Types: Cigarettes    Last attempt to quit: 02/21/2017    Years since quitting: 0.2  . Smokeless tobacco: Never Used  . Tobacco comment: 4 a day   Substance and Sexual Activity  . Alcohol use: Yes    Alcohol/week: 0.0 oz    Comment: rare  . Drug use: No  . Sexual activity: Yes    Birth  control/protection: Condom, Surgical  Other Topics Concern  . 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 Enjoys walking   Diet: Tries to watch what she eats. Does not eat a lot of processed foods or fast food.     Past Surgical History:  Procedure Laterality Date  . EYE SURGERY     "Lazy Muscle" x 2- one as child and another 64  . SPINE SURGERY     L5-s1  . VAGINAL HYSTERECTOMY    . VULVA /PERINEUM BIOPSY N/A 11/18/2015   Procedure: WIDE LOCAL EXCISION OF VULVA and MEDIAL LEFT THIGH LESION ;  Surgeon: Terrance Mass, MD;  Location: [facility];  Service: Gynecology;  Laterality: N/A;  . WISDOM TOOTH EXTRACTION      Family History  Problem Relation Age of Onset  . Cancer Mother        LUNG   . Breast cancer Mother        Died  . Esophageal cancer Mother   . Colon cancer Neg Hx   . Colon polyps Neg Hx   . Rectal cancer Neg Hx   . Stomach cancer Neg Hx     Allergies  Allergen Reactions  . Percocet [Oxycodone-Acetaminophen] Itching    Current Outpatient Medications on File Prior to Visit  Medication Sig Dispense Refill  . albuterol (PROVENTIL HFA;VENTOLIN HFA) 108 (90 BASE) MCG/ACT inhaler Inhale 2 puffs into the lungs every 6 (six) hours as needed.    Marland Kitchen aspirin EC 81 MG tablet Take 1 tablet (81 mg total) by mouth daily. 90 tablet 3  . atorvastatin (LIPITOR) 20 MG tablet TAKE 1 TABLET BY MOUTH EVERY DAY 90 tablet 0  . docusate sodium (COLACE) 50 MG capsule Take by mouth as needed for mild constipation or moderate constipation. Over the counter Equate stool softener    . EPIPEN 2-PAK 0.3 MG/0.3ML SOAJ injection     . estradiol (ESTRACE) 1 MG tablet Take 1 1/2 tablet by mouth daily 60 tablet 2  . fluticasone (FLONASE) 50 MCG/ACT nasal spray Place 2 sprays into both nostrils daily. 16 g 6  . Fluticasone-Salmeterol (ADVAIR)  250-50 MCG/DOSE AEPB Inhale 1 puff into the lungs as needed.    Marland Kitchen KLOR-CON M20 20 MEQ tablet TAKE 1 TABLET BY MOUTH EVERY DAY 90 tablet 0  . lansoprazole (PREVACID) 30 MG capsule TAKE 1 CAPSULE BY MOUTH ONCE DAILY 90 capsule 3  . levocetirizine (XYZAL) 5 MG tablet Take 5 mg by mouth every evening.     Marland Kitchen losartan-hydrochlorothiazide (HYZAAR) 50-12.5 MG tablet Take 1 tablet by mouth daily. 90 tablet 3  . Melatonin 5 MG  TABS Take 1 tablet by mouth at bedtime.    . montelukast (SINGULAIR) 10 MG tablet Take 10 mg by mouth at bedtime.     No current facility-administered medications on file prior to visit.     BP 138/86 (BP Location: Right Arm, Patient Position: Sitting, Cuff Size: Large)   Pulse 65   Temp 98.3 F (36.8 C) (Oral)   Resp 16   Ht 5\' 6"  (1.676 m)   Wt 215 lb 12.8 oz (97.9 kg)   SpO2 98%   BMI 34.83 kg/m       Objective:   Physical Exam  Constitutional: She is oriented to person, place, and time. She appears well-developed and well-nourished. No distress.  Obese    HENT:  Head: Normocephalic and atraumatic.  Right Ear: External ear normal.  Left Ear: External ear normal.  Nose: Nose normal.  Mouth/Throat: Oropharynx is clear and moist. No oropharyngeal exudate.  Eyes: Conjunctivae and EOM are normal. Pupils are equal, round, and reactive to light. Right eye exhibits no discharge. Left eye exhibits no discharge. No scleral icterus.  Neck: Normal range of motion. Neck supple. No JVD present. No tracheal deviation present. No thyromegaly present.  Cardiovascular: Normal rate, regular rhythm, normal heart sounds and intact distal pulses. Exam reveals no gallop and no friction rub.  No murmur heard. Pulmonary/Chest: Effort normal and breath sounds normal. No stridor. No respiratory distress. She has no wheezes. She has no rales. She exhibits no tenderness.  Abdominal: Soft. Bowel sounds are normal. She exhibits no distension and no mass. There is no tenderness. There is no  rebound and no guarding.  Genitourinary:  Genitourinary Comments: Deferred: Will be done by GYN   Musculoskeletal: Normal range of motion. She exhibits no edema, tenderness or deformity.  Lymphadenopathy:    She has no cervical adenopathy.  Neurological: She is alert and oriented to person, place, and time. She has normal reflexes. She displays normal reflexes. No cranial nerve deficit. She exhibits normal muscle tone. Coordination normal.  + phalens and tinels sign in bilateral wrists  Skin: Skin is warm and dry. No rash noted. She is not diaphoretic. No erythema. No pallor.  Psychiatric: She has a normal mood and affect. Her behavior is normal. Judgment and thought content normal.  Nursing note and vitals reviewed.     Assessment & Plan:   1. Routine general medical examination at a health care facility - Needs to lose weight through diet and exercise.  - Follow up in one year or sooner if needed - Basic metabolic panel - CBC with Differential/Platelet - Hepatic function panel - Hemoglobin A1c - Lipid panel - TSH  2. Essential hypertension, benign - Near goal - Advised weight loss through diet and exercise - Will continue to monitor  - Basic metabolic panel - CBC with Differential/Platelet - Hepatic function panel - Hemoglobin A1c - Lipid panel - TSH  3. Hyperlipidemia, unspecified hyperlipidemia type - Consider increase statin  - Advised heart healthy diet and frequent exercise  - Basic metabolic panel - CBC with Differential/Platelet - Hepatic function panel - Hemoglobin A1c - Lipid panel - TSH  4. Carpal tunnel syndrome on both sides  - Ambulatory referral to Sports Medicine   Dorothyann Peng, NP

## 2017-06-15 ENCOUNTER — Encounter: Payer: Self-pay | Admitting: Sports Medicine

## 2017-06-15 ENCOUNTER — Ambulatory Visit: Payer: Self-pay

## 2017-06-15 ENCOUNTER — Ambulatory Visit: Payer: Medicare Other | Admitting: Sports Medicine

## 2017-06-15 VITALS — BP 140/90 | HR 79 | Ht 66.0 in | Wt 219.4 lb

## 2017-06-15 DIAGNOSIS — M25532 Pain in left wrist: Secondary | ICD-10-CM | POA: Diagnosis not present

## 2017-06-15 DIAGNOSIS — M25531 Pain in right wrist: Secondary | ICD-10-CM | POA: Diagnosis not present

## 2017-06-15 DIAGNOSIS — G5603 Carpal tunnel syndrome, bilateral upper limbs: Secondary | ICD-10-CM

## 2017-06-15 NOTE — Progress Notes (Signed)
[facility]. [facility] at [facility] [phone]  Alcus Dad - 57 y.o. female MRN [id number]  Date of birth: 06/29/1960  Visit Date: 06/15/2017  PCP: Shirline Frees, NP   Referred by: Shirline Frees, NP   Scribe for today's visit: Stevenson Clinch, CMA     SUBJECTIVE:  Alcus Dad is here for New Patient (Initial Visit) (bilateral wrist pain)  Her bilateral wrist symptoms INITIALLY: Began years ago but has gotten worse over the past few months. She was dx with carpal tunnel > 10 yrs aoo.  Described as severe aching, numbness, and stabbing pains, radiating to the hands and slightly into the lower arm. Hands feel like they go numb.  Pain seems to be random, nothing seems to flare it up or make it feel better when it is flared up.  Additional associated symptoms include: She has noticed some swelling in both hands/wrist. The hands are tender to touch, along the thumb.     At this time symptoms are worsening compared to onset. She has tried taking Prednisone in the past with minimal relief. She has a wrist spint for each wrist and she does get some periodic relief when sleeping in these.     ROS Reports night time disturbances. Denies fevers, chills, or night sweats. Denies unexplained weight loss. Reports personal history of cancer, uterine. Reports changes in bowel or bladder habits, increased water intake. Denies recent unreported falls. Denies new or worsening dyspnea or wheezing. Denies headaches or dizziness.  Reports numbness, tingling or weakness  In the extremities.  Denies dizziness or presyncopal episodes Denies lower extremity edema     HISTORY & PERTINENT PRIOR DATA:  Prior History reviewed and updated per electronic medical record.  Significant history, findings, studies and interim changes include:  reports that she quit smoking about 4 months ago. Her smoking use included cigarettes. She has a 5.00 pack-year  smoking history. she has never used smokeless tobacco. Recent Labs    06/08/17 1002  HGBA1C 6.0   No specialty comments available. Problem  Bilateral Carpal Tunnel Syndrome    OBJECTIVE:  VS:  HT:5\' 6"  (167.6 cm)   WT:219 lb 6.4 oz (99.5 kg)  BMI:35.43    BP:140/90  HR:79bpm  TEMP: ( )  RESP:95 %   PHYSICAL EXAM: Constitutional: WDWN, Non-toxic appearing. Psychiatric: Alert & appropriately interactive.  Not depressed or anxious appearing. Respiratory: No increased work of breathing.  Trachea Midline Eyes: Pupils are equal.  EOM intact without nystagmus.  No scleral icterus  NEUROVASCULAR exam: No clubbing or cyanosis appreciated No significant venous stasis changes Capillary Refill: normal, less than 2 seconds   Bilateral hands overall well aligned.  She has a slight amount of flattening of the thenar eminence bilaterally right worse than left.  Grip strength is intact.  Slight pain with Phalen's and Tinel's testing.  Moderate degree of pain with carpal tunnel compression test.    ASSESSMENT & PLAN:   1. Bilateral wrist pain   2. Bilateral carpal tunnel syndrome    PLAN:    Bilateral carpal tunnel syndrome Symptoms and ultrasound findings of median nerve are suggestive of severe carpal tunnel syndrome.  Diagnostic and therapeutic injection performed today.  Continue with bracing especially at night.  If any lack of improvement will need electrodiagnostic studies we did discuss the potential for chronic nerve damage if severe carpal tunnel is left untreated.   ++++++++++++++++++++++++++++++++++++++++++++ Orders & Meds: Orders Placed This Encounter  Procedures  .  US GUIDED NEEDLE PLACEMENT(NO LINKED CHARGES)     ++++++++++++++++++++++++++++++++++++++++++++ Follow-up: Return in about 2 weeks (around 06/29/2017).   Pertinent documentation may be included in additional procedure notes, imaging studies, problem based documentation and patient instructions. Please see  these sections of the encounter for additional information regarding this visit. CMA/ATC served as Neurosurgeon during this visit. History, Physical, and Plan performed by medical provider. Documentation and orders reviewed and attested to.      Andrena Mews, DO    [facility]

## 2017-06-15 NOTE — Procedures (Signed)
PROCEDURE NOTE:  Ultrasound Guided: Injection: Right median nerve hydrodissection Images were obtained and interpreted by myself, Teresa Coombs, DO  Images have been saved and stored to PACS system. Images obtained on: GE S7 Ultrasound machine  ULTRASOUND FINDINGS:  Right median nerve measures 0.27 cm square Left median nerve measures 0.5 cm there is fusiform swelling both of these.  DESCRIPTION OF PROCEDURE:  The patient's clinical condition is marked by substantial pain and/or significant functional disability. Other conservative therapy has not provided relief, is contraindicated, or not appropriate. There is a reasonable likelihood that injection will significantly improve the patient's pain and/or functional impairment.  After discussing the risks, benefits and expected outcomes of the injection and all questions were reviewed and answered, the patient wished to undergo the above named procedure. Verbal consent was obtained.  The ultrasound was used to identify the target structure and adjacent neurovascular structures. The skin was then prepped in sterile fashion and the target structure was injected under direct visualization using sterile technique as below:  PREP: Alcohol, Ethel Chloride,  APPROACH: direct, single injection, 25g 1.5in. INJECTATE: 0.5 cc 1% lidocaine, 0.5cc 0.5% marcaine, 0.5cc 40mg /mL DepoMedrol   ASPIRATE: None   DRESSING: Band-Aid & recommend bracing with night splints she already has  Post procedural instructions including recommending icing and warning signs for infection were reviewed.  This procedure was well tolerated and there were no complications.   IMPRESSION: Succesful Ultrasound Guided: Injection

## 2017-06-15 NOTE — Patient Instructions (Signed)

## 2017-06-22 ENCOUNTER — Telehealth: Payer: Self-pay | Admitting: Adult Health

## 2017-06-22 ENCOUNTER — Other Ambulatory Visit: Payer: Self-pay

## 2017-06-22 DIAGNOSIS — G5603 Carpal tunnel syndrome, bilateral upper limbs: Secondary | ICD-10-CM

## 2017-06-22 NOTE — Telephone Encounter (Signed)
Copied from [place] [id number]. Topic: Quick Communication - See Telephone Encounter >> Jun 22, 2017 12:41 PM Percell Belt A wrote: CRM for notification. See Telephone encounter for:  Pt called in and said that she got an injection a week ago and it is not feeling any better.  Her hand is swollen and the tip of her figure is numb.  Dr Paulla Fore have her this injection .  She would like to know what to do if it is not any better    06/22/17.

## 2017-06-22 NOTE — Telephone Encounter (Signed)
See note

## 2017-06-22 NOTE — Telephone Encounter (Signed)
We can send her for nerve conduction studies to evaluate the severity of carpal tunnel.  She had a markedly swollen median nerves at the ultrasound and likely has severe carpal tunnel.  Go ahead and set her up for electrodiagnostic testing with Dr. Ernestina Patches

## 2017-06-22 NOTE — Telephone Encounter (Signed)
Per OV note, pt had swelling and numbness prior to injection. Please advise.

## 2017-06-22 NOTE — Telephone Encounter (Signed)
Called pt and advised of results/recommendations. Pt verbalized understanding. Referral has been placed. Upcoming OV with Dr. Paulla Fore has been canceled per pt request, she will follow-up after nerve conduction study.

## 2017-06-27 ENCOUNTER — Other Ambulatory Visit (HOSPITAL_COMMUNITY)
Admission: RE | Admit: 2017-06-27 | Discharge: 2017-06-27 | Disposition: A | Discharge status: Home / Self Care | Payer: Medicare Other | Source: Ambulatory Visit | Attending: Adult Health | Admitting: Adult Health

## 2017-06-27 ENCOUNTER — Other Ambulatory Visit: Payer: Self-pay | Admitting: Adult Health

## 2017-06-27 ENCOUNTER — Encounter: Payer: Self-pay | Admitting: Adult Health

## 2017-06-27 ENCOUNTER — Ambulatory Visit: Payer: Medicare Other | Admitting: Adult Health

## 2017-06-27 VITALS — BP 140/90 | Temp 98.3°F | Wt 217.0 lb

## 2017-06-27 DIAGNOSIS — R8290 Unspecified abnormal findings in urine: Secondary | ICD-10-CM | POA: Diagnosis present

## 2017-06-27 DIAGNOSIS — Z113 Encounter for screening for infections with a predominantly sexual mode of transmission: Secondary | ICD-10-CM | POA: Insufficient documentation

## 2017-06-27 DIAGNOSIS — L918 Other hypertrophic disorders of the skin: Secondary | ICD-10-CM | POA: Diagnosis not present

## 2017-06-27 LAB — POCT URINALYSIS DIPSTICK
Bilirubin, UA: NEGATIVE
Blood, UA: NEGATIVE
Glucose, UA: NEGATIVE
KETONES UA: NEGATIVE
Leukocytes, UA: NEGATIVE
NITRITE UA: NEGATIVE
ODOR: NEGATIVE
PH UA: 6 (ref 5.0–8.0)
PROTEIN UA: NEGATIVE
SPEC GRAV UA: 1.025 (ref 1.010–1.025)
UROBILINOGEN UA: 0.2 U/dL

## 2017-06-27 NOTE — Progress Notes (Signed)
Subjective:    Patient ID: Kristin Merritt, female    DOB: 11/01/1960, 57 y.o.   MRN: [id number]  HPI  57 year old female who  has a past medical history of Allergy, Anemia, Anxiety, Asthma, Constipation, Depression, GERD (gastroesophageal reflux disease), Heart murmur, High cholesterol, Hypertension, Leg cramps, Lichen sclerosus et atrophicus, Numbness and tingling in hands, Postmenopausal HRT (hormone replacement therapy) - followed by Dr. Toney Rakes in gyn (07/11/2012), Swelling of both hands, Uterine cancer ([place]) (1991), UTI (urinary tract infection), VIN III (vulvar intraepithelial neoplasia III), and VIN III (vulvar intraepithelial neoplasia III).  She presents to the office today to have multiple skin tags removed from her axilla. She has 2 skin tags in the right axilla and one skin tag in her left axilla that she reports causing irritation with certain clothes and when she applies deodorant   She would also like STD testing for gonorrhea, chlamydia, trichomonas, and BV. She reports "funky smell" that she is unsure if it's coming from her urine or her her vagina. She denies any vaginal discharge, burning, itching. She does not want a vaginal exam today. She denies any urinary infection symptoms such as dysuria, frequency, urgency, decreased urination, or feeling as though she is not emptying her bladder completely  Review of Systems See HPI   Past Medical History:  Diagnosis Date  . Allergy   . Anemia    as child  . Anxiety   . Asthma   . Constipation   . Depression   . GERD (gastroesophageal reflux disease)   . Heart murmur   . High cholesterol   . Hypertension   . Leg cramps   . Lichen sclerosus et atrophicus   . Numbness and tingling in hands   . Postmenopausal HRT (hormone replacement therapy) - followed by Dr. Toney Rakes in gyn 07/11/2012  . Swelling of both hands   . Uterine cancer ([place]) 1991  . UTI (urinary tract infection)   . VIN III (vulvar intraepithelial neoplasia  III)    left labia majora  . VIN III (vulvar intraepithelial neoplasia III)     Social History   Socioeconomic History  . Marital status: Widowed    Spouse name: Not on file  . Number of children: Not on file  . Years of education: Not on file  . Highest education level: Not on file  Social Needs  . Financial resource strain: Not on file  . Food insecurity - worry: Not on file  . Food insecurity - inability: Not on file  . Transportation needs - medical: Not on file  . Transportation needs - non-medical: Not on file  Occupational History  . Not on file  Tobacco Use  . Smoking status: Former Smoker    Packs/day: 0.25    Years: 20.00    Pack years: 5.00    Types: Cigarettes    Last attempt to quit: 02/21/2017    Years since quitting: 0.3  . Smokeless tobacco: Never Used  . Tobacco comment: 4 a day   Substance and Sexual Activity  . Alcohol use: Yes    Alcohol/week: 0.0 oz    Comment: rare  . Drug use: No  . Sexual activity: Yes    Birth control/protection: Condom, Surgical  Other Topics Concern  . 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 Enjoys walking   Diet: Tries to watch what she eats. Does not eat a lot of processed foods or fast food.     Past Surgical History:  Procedure Laterality Date  . EYE SURGERY     "Lazy Muscle" x 2- one as child and another 28  . SPINE SURGERY     L5-s1  . VAGINAL HYSTERECTOMY    . VULVA /PERINEUM BIOPSY N/A 11/18/2015   Procedure: WIDE LOCAL EXCISION OF VULVA and MEDIAL LEFT THIGH LESION ;  Surgeon: Terrance Mass, MD;  Location: [facility];  Service: Gynecology;  Laterality: N/A;  . WISDOM TOOTH EXTRACTION      Family History  Problem Relation Age of Onset  . Cancer Mother        LUNG   . Breast cancer Mother        Died  . Esophageal cancer Mother   . Colon cancer Neg  Hx   . Colon polyps Neg Hx   . Rectal cancer Neg Hx   . Stomach cancer Neg Hx     Allergies  Allergen Reactions  . Percocet [Oxycodone-Acetaminophen] Itching    Current Outpatient Medications on File Prior to Visit  Medication Sig Dispense Refill  . albuterol (PROVENTIL HFA;VENTOLIN HFA) 108 (90 BASE) MCG/ACT inhaler Inhale 2 puffs into the lungs every 6 (six) hours as needed.    Marland Kitchen aspirin EC 81 MG tablet Take 1 tablet (81 mg total) by mouth daily. 90 tablet 3  . atorvastatin (LIPITOR) 20 MG tablet TAKE 1 TABLET BY MOUTH EVERY DAY 90 tablet 0  . docusate sodium (COLACE) 50 MG capsule Take by mouth as needed for mild constipation or moderate constipation. Over the counter Equate stool softener    . EPIPEN 2-PAK 0.3 MG/0.3ML SOAJ injection     . estradiol (ESTRACE) 1 MG tablet Take 1 1/2 tablet by mouth daily 60 tablet 2  . fluticasone (FLONASE) 50 MCG/ACT nasal spray Place 2 sprays into both nostrils daily. 16 g 6  . Fluticasone-Salmeterol (ADVAIR) 250-50 MCG/DOSE AEPB Inhale 1 puff into the lungs as needed.    . lansoprazole (PREVACID) 30 MG capsule TAKE 1 CAPSULE BY MOUTH ONCE DAILY 90 capsule 3  . levocetirizine (XYZAL) 5 MG tablet Take 5 mg by mouth every evening.     Marland Kitchen losartan-hydrochlorothiazide (HYZAAR) 50-12.5 MG tablet Take 1 tablet by mouth daily. 90 tablet 3  . Melatonin 5 MG TABS Take 1 tablet by mouth at bedtime.    . montelukast (SINGULAIR) 10 MG tablet Take 10 mg by mouth at bedtime.     No current facility-administered medications on file prior to visit.     BP 140/90 (BP Location: Left Arm)   Temp 98.3 F (36.8 C) (Oral)   Wt 217 lb (98.4 kg)   BMI 35.02 kg/m       Objective:   Physical Exam  Constitutional: She is oriented to person, place, and time. She appears well-developed and well-nourished. No distress.  Neurological: She is alert and oriented to person, place, and time.  Skin: Skin is warm and dry. No rash noted. She is not diaphoretic. No erythema.  No pallor.  Two  benign-appearing skin tags in right axilla and one benign appearing skin tag and left axilla  Psychiatric: She has a normal mood and affect. Her behavior is normal. Judgment and thought content normal.  Nursing note and vitals reviewed.     Assessment & Plan:  1. Skin tag Skin  tags are snipped off using Betadine for cleansing and sterile iris scissors. Local anesthesia was  used. These pathognomonic lesions are not sent for pathology. - patient tolerated procedure well  - bleeding controlled prior to discharge.   2. Abnormal urine odor  - Urine cytology ancillary only - POC Urinalysis Dipstick  3. Screen for STD (sexually transmitted disease) - Urine cytology ancillary only  Dorothyann Peng, NP

## 2017-06-28 LAB — URINE CYTOLOGY ANCILLARY ONLY
CHLAMYDIA, DNA PROBE: NEGATIVE
Neisseria Gonorrhea: NEGATIVE
Trichomonas: NEGATIVE

## 2017-06-29 ENCOUNTER — Ambulatory Visit: Payer: Medicare Other | Admitting: Sports Medicine

## 2017-06-30 LAB — URINE CYTOLOGY ANCILLARY ONLY
Bacterial vaginitis: NEGATIVE
Candida vaginitis: NEGATIVE

## 2017-07-13 ENCOUNTER — Encounter: Payer: Self-pay | Admitting: Sports Medicine

## 2017-07-13 ENCOUNTER — Encounter (INDEPENDENT_AMBULATORY_CARE_PROVIDER_SITE_OTHER): Payer: Self-pay | Admitting: Physical Medicine and Rehabilitation

## 2017-07-13 ENCOUNTER — Ambulatory Visit (INDEPENDENT_AMBULATORY_CARE_PROVIDER_SITE_OTHER): Payer: Medicare Other | Admitting: Physical Medicine and Rehabilitation

## 2017-07-13 DIAGNOSIS — G5603 Carpal tunnel syndrome, bilateral upper limbs: Secondary | ICD-10-CM | POA: Insufficient documentation

## 2017-07-13 DIAGNOSIS — R202 Paresthesia of skin: Secondary | ICD-10-CM | POA: Diagnosis not present

## 2017-07-13 NOTE — Progress Notes (Deleted)
Numbness in fingers. Third and fourth fingers were numb for 4-5 days after injection by Dr. Paulla Fore. Has had swelling in fingers and hands. Fingers in left hand get very numb. Left seems worse than right. Right hand dominant.

## 2017-07-13 NOTE — Assessment & Plan Note (Addendum)
Symptoms and ultrasound findings of median nerve are suggestive of severe carpal tunnel syndrome.  Diagnostic and therapeutic injection performed today.  Continue with bracing especially at night.  If any lack of improvement will need electrodiagnostic studies we did discuss the potential for chronic nerve damage if severe carpal tunnel is left untreated.

## 2017-07-13 NOTE — Progress Notes (Signed)
Kristin Merritt - 57 y.o. female MRN [id number]  Date of birth: 1960/09/23  Office Visit Note: Visit Date: 07/13/2017 PCP: Dorothyann Peng, NP Referred by: Dorothyann Peng, NP  Subjective: Chief Complaint  Patient presents with  . Right Hand - Numbness  . Left Hand - Numbness   HPI: Ms. Ritacco is a 57 year old right-hand-dominant female who comes in today at the request of Dr. Teresa Coombs for electrodiagnostic study of the upper extremities bilaterally.  She reports a history of carpal tunnel syndrome which was diagnosed in [place].  She reports having had electrodiagnostic study at that time showing significant problem with the nerves.  She was treated just with splinting and medication.  She reports numbness in the fingers and particularly the radial digits.  She reports injection by Dr. Paulla Fore with prolonged numbness in the third and fourth fingers for a few days.  She reports that she feels like her fingers are swollen bilaterally.  She reports the left side seems to be worse than the right in terms of the numbness at this point.  She does endorse nocturnal complaints and complaints with certain positions.  She has felt like her hands are weak.  In reviewing the notes it looks like she saw Dr. Alysia Penna in 2016 with similar complaints and he did complete electrodiagnostic study as well.  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  Gave her wrist splints at the time to see her back for carpal tunnel injection.  I could not tell from the notes if the injection was ever completed.     ROS Otherwise per HPI.  Assessment & Plan: Visit Diagnoses:  1. Paresthesia of skin     Plan: No additional findings.  Impression: The above electrodiagnostic study is ABNORMAL and reveals evidence of a moderate to severe BILATERAL RIGHT>LEFT median nerve entrapment at the wrist (carpal tunnel syndrome) affecting sensory and motor components.   There is no  significant electrodiagnostic evidence of any other focal nerve entrapment, brachial plexopathy or generalized peripheral neuropathy.   Recommendations: 1.  Follow-up with referring physician. 2.  Continue current management of symptoms. 3.  Continue use of resting splint at night-time and as needed during the day. 4.  Suggest surgical evaluation.    Meds & Orders: No orders of the defined types were placed in this encounter.   Orders Placed This Encounter  Procedures  . NCV with EMG (electromyography)    Follow-up: Return for Dr. Paulla Fore.   Procedures: No procedures performed  EMG & NCV Findings: Evaluation of the left median motor and the right median motor nerves showed prolonged distal onset latency (L4.8, R5.2 ms) and decreased conduction velocity (Elbow-Wrist, L46, R47 m/s).  The left median (across palm) sensory nerve showed no response (Palm), prolonged distal peak latency (5.6 ms), and reduced amplitude (8.2 V).  The right median (across palm) sensory nerve showed no response (Wrist) and no response (Palm).  The left ulnar sensory nerve showed reduced amplitude (11.5 V).  All remaining nerves (as indicated in the following tables) were within normal limits.  Left vs. Right side comparison data for the ulnar motor nerve indicates abnormal L-R velocity difference (A Elbow-B Elbow, 24 m/s).  All remaining left vs. right side differences were within normal limits.    All examined muscles (as indicated in the following table) showed no evidence of electrical instability.    Impression: The above electrodiagnostic study is ABNORMAL and reveals evidence of a moderate to severe  BILATERAL RIGHT>LEFT median nerve entrapment at the wrist (carpal tunnel syndrome) affecting sensory and motor components.   There is no significant electrodiagnostic evidence of any other focal nerve entrapment, brachial plexopathy or generalized peripheral neuropathy.   Recommendations: 1.  Follow-up with  referring physician. 2.  Continue current management of symptoms. 3.  Continue use of resting splint at night-time and as needed during the day. 4.  Suggest surgical evaluation.   Nerve Conduction Studies Anti Sensory Summary Table   Stim Site NR Peak (ms) Norm Peak (ms) P-T Amp (V) Norm P-T Amp Site1 Site2 Delta-P (ms) Dist (cm) Vel (m/s) Norm Vel (m/s)  Left Median Acr Palm Anti Sensory (2nd Digit)  32.9C  Wrist    *5.6 <3.6 *8.2 >10 Wrist Palm  0.0    Palm *NR  <2.0          Right Median Acr Palm Anti Sensory (2nd Digit)  32C  Wrist *NR  <3.6  >10 Wrist Palm  0.0    Palm *NR  <2.0          Left Radial Anti Sensory (Base 1st Digit)  32.8C  Wrist    2.0 <3.1 20.6  Wrist Base 1st Digit 2.0 0.0    Right Radial Anti Sensory (Base 1st Digit)  32C  Wrist    2.2 <3.1 8.2  Wrist Base 1st Digit 2.2 0.0    Left Ulnar Anti Sensory (5th Digit)  33.1C  Wrist    3.2 <3.7 *11.5 >15.0 Wrist 5th Digit 3.2 14.0 44 >38  Right Ulnar Anti Sensory (5th Digit)  32.2C  Wrist    3.0 <3.7 17.7 >15.0 Wrist 5th Digit 3.0 14.0 47 >38   Motor Summary Table   Stim Site NR Onset (ms) Norm Onset (ms) O-P Amp (mV) Norm O-P Amp Site1 Site2 Delta-0 (ms) Dist (cm) Vel (m/s) Norm Vel (m/s)  Left Median Motor (Abd Poll Brev)  32.8C  Wrist    *4.8 <4.2 9.7 >5 Elbow Wrist 4.7 21.5 *46 >50  Elbow    9.5  9.9         Right Median Motor (Abd Poll Brev)  32.3C  Wrist    *5.2 <4.2 9.8 >5 Elbow Wrist 4.5 21.0 *47 >50  Elbow    9.7  9.7         Left Ulnar Motor (Abd Dig Min)  33.1C  Wrist    3.0 <4.2 9.6 >3 B Elbow Wrist 3.3 21.0 64 >53  B Elbow    6.3  9.1  A Elbow B Elbow 1.0 9.5 95 >53  A Elbow    7.3  9.3         Right Ulnar Motor (Abd Dig Min)  32.6C  Wrist    2.9 <4.2 10.3 >3 B Elbow Wrist 3.5 22.0 63 >53  B Elbow    6.4  10.1  A Elbow B Elbow 1.4 10.0 71 >53  A Elbow    7.8  10.3          EMG   Side Muscle Nerve Root Ins Act Fibs Psw Amp Dur Poly Recrt Int Fraser Din Comment  Left Abd Poll Brev Median  C8-T1 Nml Nml Nml Nml Nml 0 Nml Nml   Left 1stDorInt Ulnar C8-T1 Nml Nml Nml Nml Nml 0 Nml Nml     Nerve Conduction Studies Anti Sensory Left/Right Comparison   Stim Site L Lat (ms) R Lat (ms) L-R Lat (ms) L Amp (V) R Amp (V) L-R Amp (%)  Site1 Site2 L Vel (m/s) R Vel (m/s) L-R Vel (m/s)  Median Acr Palm Anti Sensory (2nd Digit)  32.9C  Wrist *5.6   *8.2   Wrist Palm     Palm             Radial Anti Sensory (Base 1st Digit)  32.8C  Wrist 2.0 2.2 0.2 20.6 8.2 60.2 Wrist Base 1st Digit     Ulnar Anti Sensory (5th Digit)  33.1C  Wrist 3.2 3.0 0.2 *11.5 17.7 35.0 Wrist 5th Digit 44 47 3   Motor Left/Right Comparison   Stim Site L Lat (ms) R Lat (ms) L-R Lat (ms) L Amp (mV) R Amp (mV) L-R Amp (%) Site1 Site2 L Vel (m/s) R Vel (m/s) L-R Vel (m/s)  Median Motor (Abd Poll Brev)  32.8C  Wrist *4.8 *5.2 0.4 9.7 9.8 1.0 Elbow Wrist *46 *47 1  Elbow 9.5 9.7 0.2 9.9 9.7 2.0       Ulnar Motor (Abd Dig Min)  33.1C  Wrist 3.0 2.9 0.1 9.6 10.3 6.8 B Elbow Wrist 64 63 1  B Elbow 6.3 6.4 0.1 9.1 10.1 9.9 A Elbow B Elbow 95 71 *24  A Elbow 7.3 7.8 0.5 9.3 10.3 9.7          Waveforms:                     Clinical History: Electrodiagnostic study bilateral upper extremities by Dr. 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  The results of this are scanned into the chart on Epic by looking at Dr. Letta Pate notes from that day.  She reports that she quit smoking about 4 months ago. Her smoking use included cigarettes. She has a 5.00 pack-year smoking history. she has never used smokeless tobacco.  Recent Labs    06/08/17 1002  HGBA1C 6.0    Objective:  VS:  HT:    WT:   BMI:     BP:   HR: bpm  TEMP: ( )  RESP:  Physical Exam  Musculoskeletal:  Inspection reveals mild flattening of the bilateral APB but no atrophy of the bilateral APB or FDI or hand intrinsics. There is no swelling, color changes,  allodynia or dystrophic changes. There is 5 out of 5 strength in the bilateral wrist extension, finger abduction and long finger flexion. There is intact sensation to light touch in all dermatomal and peripheral nerve distributions.  There is a positive Phalen's test bilaterally. There is a negative Hoffmann's test bilaterally.    Ortho Exam Imaging: No results found.  Past Medical/Family/Surgical/Social History: Medications & Allergies reviewed per EMR Patient Active Problem List   Diagnosis Date Noted  . Bilateral carpal tunnel syndrome 07/13/2017  . LGSIL Pap smear of vagina 09/08/2016  . 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 Tobacco abuse 12/01/2015  . History of uterine cancer 10/07/2015  . 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 Postlaminectomy syndrome, lumbar region 12/17/2014  . Right lumbar radiculitis 12/17/2014  . Lichen sclerosus 97/67/3419  . History of vulvar dysplasia 04/08/2014  . Asthma, chronic 08/21/2012  . Essential hypertension, benign 08/21/2012  . Hyperlipemia 08/21/2012  . Anxiety 08/21/2012  . Chronic low back pain 08/21/2012  . Allergic rhinitis 08/21/2012  . Recurrent UTI after sex, uses prophylactic cipro with diflucan for prevention yeast infection 08/21/2012  . Symptoms, such as flushing, sleeplessness, headache, lack of concentration, associated with  the menopause 07/11/2012  . Postmenopausal HRT (hormone replacement therapy) - followed by Dr. Toney Rakes in gyn 07/11/2012   Past Medical History:  Diagnosis Date  . Allergy   . Anemia    as child  . Anxiety   . Asthma   . Constipation   . Depression   . GERD (gastroesophageal reflux disease)   . Heart murmur   . High cholesterol   . Hypertension   . Leg cramps   . Lichen sclerosus et atrophicus   . Numbness and tingling in hands   . Postmenopausal HRT (hormone replacement therapy) - followed by Dr. Toney Rakes in gyn 07/11/2012  . Swelling of both hands   . Uterine cancer ([place])  1991  . UTI (urinary tract infection)   . 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 VIN III (vulvar intraepithelial neoplasia III)    Family History  Problem Relation Age of Onset  . Cancer Mother        LUNG   . Breast cancer Mother        Died  . Esophageal cancer Mother   . Colon cancer Neg Hx   . Colon polyps Neg Hx   . Rectal cancer Neg Hx   . Stomach cancer Neg Hx    Past Surgical History:  Procedure Laterality Date  . EYE SURGERY     "Lazy Muscle" x 2- one as child and another 23  . SPINE SURGERY     L5-s1  . VAGINAL HYSTERECTOMY    . VULVA /PERINEUM BIOPSY N/A 11/18/2015   Procedure: WIDE LOCAL EXCISION OF VULVA and MEDIAL LEFT THIGH LESION ;  Surgeon: Terrance Mass, MD;  Location: [facility];  Service: Gynecology;  Laterality: N/A;  . WISDOM TOOTH EXTRACTION     Social History   Occupational History  . Not on file  Tobacco Use  . Smoking status: Former Smoker    Packs/day: 0.25    Years: 20.00    Pack years: 5.00    Types: Cigarettes    Last attempt to quit: 02/21/2017    Years since quitting: 0.3  . Smokeless tobacco: Never Used  . Tobacco comment: 4 a day   Substance and Sexual Activity  . Alcohol use: Yes    Alcohol/week: 0.0 oz    Comment: rare  . Drug use: No  . Sexual activity: Yes    Birth control/protection: Condom, Surgical

## 2017-07-15 NOTE — Procedures (Signed)
EMG & NCV Findings: Evaluation of the left median motor and the right median motor nerves showed prolonged distal onset latency (L4.8, R5.2 ms) and decreased conduction velocity (Elbow-Wrist, L46, R47 m/s).  The left median (across palm) sensory nerve showed no response (Palm), prolonged distal peak latency (5.6 ms), and reduced amplitude (8.2 V).  The right median (across palm) sensory nerve showed no response (Wrist) and no response (Palm).  The left ulnar sensory nerve showed reduced amplitude (11.5 V).  All remaining nerves (as indicated in the following tables) were within normal limits.  Left vs. Right side comparison data for the ulnar motor nerve indicates abnormal L-R velocity difference (A Elbow-B Elbow, 24 m/s).  All remaining left vs. right side differences were within normal limits.    All examined muscles (as indicated in the following table) showed no evidence of electrical instability.    Impression: The above electrodiagnostic study is ABNORMAL and reveals evidence of a moderate to severe BILATERAL RIGHT>LEFT median nerve entrapment at the wrist (carpal tunnel syndrome) affecting sensory and motor components.   There is no significant electrodiagnostic evidence of any other focal nerve entrapment, brachial plexopathy or generalized peripheral neuropathy.   Recommendations: 1.  Follow-up with referring physician. 2.  Continue current management of symptoms. 3.  Continue use of resting splint at night-time and as needed during the day. 4.  Suggest surgical evaluation.   Nerve Conduction Studies Anti Sensory Summary Table   Stim Site NR Peak (ms) Norm Peak (ms) P-T Amp (V) Norm P-T Amp Site1 Site2 Delta-P (ms) Dist (cm) Vel (m/s) Norm Vel (m/s)  Left Median Acr Palm Anti Sensory (2nd Digit)  32.9C  Wrist    *5.6 <3.6 *8.2 >10 Wrist Palm  0.0    Palm *NR  <2.0          Right Median Acr Palm Anti Sensory (2nd Digit)  32C  Wrist *NR  <3.6  >10 Wrist Palm  0.0    Palm *NR   <2.0          Left Radial Anti Sensory (Base 1st Digit)  32.8C  Wrist    2.0 <3.1 20.6  Wrist Base 1st Digit 2.0 0.0    Right Radial Anti Sensory (Base 1st Digit)  32C  Wrist    2.2 <3.1 8.2  Wrist Base 1st Digit 2.2 0.0    Left Ulnar Anti Sensory (5th Digit)  33.1C  Wrist    3.2 <3.7 *11.5 >15.0 Wrist 5th Digit 3.2 14.0 44 >38  Right Ulnar Anti Sensory (5th Digit)  32.2C  Wrist    3.0 <3.7 17.7 >15.0 Wrist 5th Digit 3.0 14.0 47 >38   Motor Summary Table   Stim Site NR Onset (ms) Norm Onset (ms) O-P Amp (mV) Norm O-P Amp Site1 Site2 Delta-0 (ms) Dist (cm) Vel (m/s) Norm Vel (m/s)  Left Median Motor (Abd Poll Brev)  32.8C  Wrist    *4.8 <4.2 9.7 >5 Elbow Wrist 4.7 21.5 *46 >50  Elbow    9.5  9.9         Right Median Motor (Abd Poll Brev)  32.3C  Wrist    *5.2 <4.2 9.8 >5 Elbow Wrist 4.5 21.0 *47 >50  Elbow    9.7  9.7         Left Ulnar Motor (Abd Dig Min)  33.1C  Wrist    3.0 <4.2 9.6 >3 B Elbow Wrist 3.3 21.0 64 >53  B Elbow    6.3  9.1  A Elbow B Elbow 1.0 9.5 95 >53  A Elbow    7.3  9.3         Right Ulnar Motor (Abd Dig Min)  32.6C  Wrist    2.9 <4.2 10.3 >3 B Elbow Wrist 3.5 22.0 63 >53  B Elbow    6.4  10.1  A Elbow B Elbow 1.4 10.0 71 >53  A Elbow    7.8  10.3          EMG   Side Muscle Nerve Root Ins Act Fibs Psw Amp Dur Poly Recrt Int Fraser Din Comment  Left Abd Poll Brev Median C8-T1 Nml Nml Nml Nml Nml 0 Nml Nml   Left 1stDorInt Ulnar C8-T1 Nml Nml Nml Nml Nml 0 Nml Nml     Nerve Conduction Studies Anti Sensory Left/Right Comparison   Stim Site L Lat (ms) R Lat (ms) L-R Lat (ms) L Amp (V) R Amp (V) L-R Amp (%) Site1 Site2 L Vel (m/s) R Vel (m/s) L-R Vel (m/s)  Median Acr Palm Anti Sensory (2nd Digit)  32.9C  Wrist *5.6   *8.2   Wrist Palm     Palm             Radial Anti Sensory (Base 1st Digit)  32.8C  Wrist 2.0 2.2 0.2 20.6 8.2 60.2 Wrist Base 1st Digit     Ulnar Anti Sensory (5th Digit)  33.1C  Wrist 3.2 3.0 0.2 *11.5 17.7 35.0 Wrist 5th Digit 44 47 3    Motor Left/Right Comparison   Stim Site L Lat (ms) R Lat (ms) L-R Lat (ms) L Amp (mV) R Amp (mV) L-R Amp (%) Site1 Site2 L Vel (m/s) R Vel (m/s) L-R Vel (m/s)  Median Motor (Abd Poll Brev)  32.8C  Wrist *4.8 *5.2 0.4 9.7 9.8 1.0 Elbow Wrist *46 *47 1  Elbow 9.5 9.7 0.2 9.9 9.7 2.0       Ulnar Motor (Abd Dig Min)  33.1C  Wrist 3.0 2.9 0.1 9.6 10.3 6.8 B Elbow Wrist 64 63 1  B Elbow 6.3 6.4 0.1 9.1 10.1 9.9 A Elbow B Elbow 95 71 *24  A Elbow 7.3 7.8 0.5 9.3 10.3 9.7          Waveforms:

## 2017-07-18 ENCOUNTER — Other Ambulatory Visit: Payer: Self-pay

## 2017-07-18 DIAGNOSIS — G5603 Carpal tunnel syndrome, bilateral upper limbs: Secondary | ICD-10-CM

## 2017-07-18 NOTE — Progress Notes (Signed)
Spoke with patient and advised of results/recommendations. Referral has been placed to [facility].

## 2017-07-18 NOTE — Progress Notes (Signed)
Patient does have severe carpal tunnel syndrome and surgical intervention is indicated at this time.  Please place referral to orthopedics at [facility] to first available surgeon.    Please call and inform patient of the above.

## 2017-07-20 ENCOUNTER — Ambulatory Visit: Payer: Medicare Other | Admitting: Obstetrics & Gynecology

## 2017-07-20 ENCOUNTER — Encounter: Payer: Self-pay | Admitting: Obstetrics & Gynecology

## 2017-07-20 VITALS — BP 150/92 | Ht 66.0 in | Wt 217.0 lb

## 2017-07-20 DIAGNOSIS — Z01411 Encounter for gynecological examination (general) (routine) with abnormal findings: Secondary | ICD-10-CM

## 2017-07-20 DIAGNOSIS — C519 Malignant neoplasm of vulva, unspecified: Secondary | ICD-10-CM | POA: Diagnosis not present

## 2017-07-20 DIAGNOSIS — Z13820 Encounter for screening for osteoporosis: Secondary | ICD-10-CM

## 2017-07-20 DIAGNOSIS — Z79890 Hormone replacement therapy (postmenopausal): Secondary | ICD-10-CM

## 2017-07-20 DIAGNOSIS — Z1272 Encounter for screening for malignant neoplasm of vagina: Secondary | ICD-10-CM

## 2017-07-20 DIAGNOSIS — Z90710 Acquired absence of both cervix and uterus: Secondary | ICD-10-CM

## 2017-07-20 DIAGNOSIS — Z8542 Personal history of malignant neoplasm of other parts of uterus: Secondary | ICD-10-CM

## 2017-07-20 MED ORDER — ESTRADIOL 1 MG PO TABS: ORAL_TABLET | ORAL | 4 refills | Status: DC

## 2017-07-20 NOTE — Progress Notes (Signed)
Kristin Merritt 02-03-1961 [id number]   History:    57 y.o. G1P1L1  Married  RP:  Established patient presenting for annual gyn exam   HPI: Status post total hysterectomy for Cervical Dysplasia.  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  Vulvar Bx with Dr Denman George in 06/2016 showed no dysplasia.  No vulvar colposcopy since then.  Menopause, well on Estradiol 1 mg tablet daily.  No pelvic pain.  Breasts wnl.  Urine/BMs wnl.  BMI 35.02.  Health labs with Fam MD.  Past medical history,surgical history, family history and social history were all reviewed and documented in the EPIC chart.  Gynecologic History No LMP recorded. Patient has had a hysterectomy. Contraception: status post hysterectomy Last Pap: 06/2015. Results were: Negative Last mammogram: 07/2016. Results were: Abnormal.  Breast Bx Benign 07/2016 Bone Density: Never Colonoscopy: 2018  Obstetric History OB History  Gravida Para Term Preterm AB Living  1 1       1   SAB TAB Ectopic Multiple Live Births               # Outcome Date GA Lbr Len/2nd Weight Sex Delivery Anes PTL Lv  1 Para                ROS: A ROS was performed and pertinent positives and negatives are included in the history.  GENERAL: No fevers or chills. HEENT: No change in vision, no earache, sore throat or sinus congestion. NECK: No pain or stiffness. CARDIOVASCULAR: No chest pain or pressure. No palpitations. PULMONARY: No shortness of breath, cough or wheeze. GASTROINTESTINAL: No abdominal pain, nausea, vomiting or diarrhea, melena or bright red blood per rectum. GENITOURINARY: No urinary frequency, urgency, hesitancy or dysuria. MUSCULOSKELETAL: No joint or muscle pain, no back pain, no recent trauma. DERMATOLOGIC: No rash, no itching, no lesions. ENDOCRINE: No polyuria, polydipsia, no heat or cold intolerance. No recent change in weight. HEMATOLOGICAL: No anemia or easy bruising or bleeding. NEUROLOGIC: No headache,  seizures, numbness, tingling or weakness. PSYCHIATRIC: No depression, no loss of interest in normal activity or change in sleep pattern.     Exam:   BP (!) 150/92   Ht 5\' 6"  (1.676 m)   Wt 217 lb (98.4 kg)   BMI 35.02 kg/m   Body mass index is 35.02 kg/m.  General appearance : Well developed well nourished female. No acute distress HEENT: Eyes: no retinal hemorrhage or exudates,  Neck supple, trachea midline, no carotid bruits, no thyroidmegaly Lungs: Clear to auscultation, no rhonchi or wheezes, or rib retractions  Heart: Regular rate and rhythm, no murmurs or gallops Breast:Examined in sitting and supine position were symmetrical in appearance, no palpable masses or tenderness,  no skin retraction, no nipple inversion, no nipple discharge, no skin discoloration, no axillary or supraclavicular lymphadenopathy Abdomen: no palpable masses or tenderness, no rebound or guarding Extremities: no edema or skin discoloration or tenderness  Pelvic: Vulva: Normal, no lesion seen.             Vagina: No gross lesions or discharge.  Pap reflex done on vaginal vault.  Cervix/Uterus absent  Adnexa  Without masses or tenderness  Anus: Normal   Assessment/Plan:  57 y.o. female for annual exam   1. Encounter for gynecological examination with abnormal finding Gynecologic exam status post total hysterectomy.  Pap reflex done.  Breast exam normal.  Schedule screening mammogram now.  Health labs with family physician.  Colonoscopy done  in 2018.  2. History of total vaginal hysterectomy  3. 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  Colpo Vulvar Bx negative 06/2016.  F/U Colpo of Vulva and Vagina  4. History of uterine cancer or cervical dysplasia Vaginal hysterectomy done  5. Screening for osteoporosis Vitamin D supplements, calcium rich nutrition and regular weightbearing physical activity recommended.  Follow-up here for bone density. - DG Bone Density; Future  6.  Post-menopause on HRT (hormone replacement therapy) No contraindication to hormone replacement therapy.  Menopausal symptoms well controlled on current regimen.  Will continue on estradiol 1 mg p.o. daily.  Status post hysterectomy.  Other orders - Estradiol (ESTRACE) 1 MG tablet; Take 1 tablet by mouth daily  Counseling on above issues more than 50% for 10 minutes.  Princess Bruins MD, 8:48 AM 07/20/2017

## 2017-07-21 ENCOUNTER — Encounter: Payer: Self-pay | Admitting: Obstetrics & Gynecology

## 2017-07-21 NOTE — Patient Instructions (Signed)
1. Encounter for gynecological examination with abnormal finding Gynecologic exam status post total hysterectomy.  Pap reflex done.  Breast exam normal.  Schedule screening mammogram now.  Health labs with family physician.  Colonoscopy done in 2018.  2. History of total vaginal hysterectomy  3. 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  Colpo Vulvar Bx negative 06/2016.  F/U Colpo of Vulva and Vagina  4. History of uterine cancer or cervical dysplasia Vaginal hysterectomy done  5. Screening for osteoporosis Vitamin D supplements, calcium rich nutrition and regular weightbearing physical activity recommended.  Follow-up here for bone density. - DG Bone Density; Future  6. Post-menopause on HRT (hormone replacement therapy) No contraindication to hormone replacement therapy.  Menopausal symptoms well controlled on current regimen.  Will continue on estradiol 1 mg p.o. daily.  Status post hysterectomy.  Other orders - estradiol (ESTRACE) 1 MG tablet; Take 1 tablet by mouth daily  Kristin Merritt, it was a pleasure meeting you today!  I will inform you of your results as soon as they are available.  See you soon for the vulvar colposcopy and bone density.   Health Maintenance for Postmenopausal Women Menopause is a normal process in which your reproductive ability comes to an end. This process happens gradually over a span of months to years, usually between the ages of 33 and 62. Menopause is complete when you have missed 12 consecutive menstrual periods. It is important to talk with your health care provider about some of the most common conditions that affect postmenopausal women, such as heart disease, cancer, and bone loss (osteoporosis). Adopting a healthy lifestyle and getting preventive care can help to promote your health and wellness. Those actions can also lower your chances of developing some of these common conditions. What should I know about menopause? During  menopause, you may experience a number of symptoms, such as:  Moderate-to-severe hot flashes.  Night sweats.  Decrease in sex drive.  Mood swings.  Headaches.  Tiredness.  Irritability.  Memory problems.  Insomnia.  Choosing to treat or not to treat menopausal changes is an individual decision that you make with your health care provider. What should I know about hormone replacement therapy and supplements? Hormone therapy products are effective for treating symptoms that are associated with menopause, such as hot flashes and night sweats. Hormone replacement carries certain risks, especially as you become older. If you are thinking about using estrogen or estrogen with progestin treatments, discuss the benefits and risks with your health care provider. What should I know about heart disease and stroke? Heart disease, heart attack, and stroke become more likely as you age. This may be due, in part, to the hormonal changes that your body experiences during menopause. These can affect how your body processes dietary fats, triglycerides, and cholesterol. Heart attack and stroke are both medical emergencies. There are many things that you can do to help prevent heart disease and stroke:  Have your blood pressure checked at least every 1-2 years. High blood pressure causes heart disease and increases the risk of stroke.  If you are 93-95 years old, ask your health care provider if you should take aspirin to prevent a heart attack or a stroke.  Do not use any tobacco products, including cigarettes, chewing tobacco, or electronic cigarettes. If you need help quitting, ask your health care provider.  It is important to eat a healthy diet and maintain a healthy weight. ? Be sure to include plenty of vegetables, fruits,  low-fat dairy products, and lean protein. ? Avoid eating foods that are high in solid fats, added sugars, or salt (sodium).  Get regular exercise. This is one of the most  important things that you can do for your health. ? Try to exercise for at least 150 minutes each week. The type of exercise that you do should increase your heart rate and make you sweat. This is known as moderate-intensity exercise. ? Try to do strengthening exercises at least twice each week. Do these in addition to the moderate-intensity exercise.  Know your numbers.Ask your health care provider to check your cholesterol and your blood glucose. Continue to have your blood tested as directed by your health care provider.  What should I know about cancer screening? There are several types of cancer. Take the following steps to reduce your risk and to catch any cancer development as early as possible. Breast Cancer  Practice breast self-awareness. ? This means understanding how your breasts normally appear and feel. ? It also means doing regular breast self-exams. Let your health care provider know about any changes, no matter how small.  If you are 93 or older, have a clinician do a breast exam (clinical breast exam or CBE) every year. Depending on your age, family history, and medical history, it may be recommended that you also have a yearly breast X-ray (mammogram).  If you have a family history of breast cancer, talk with your health care provider about genetic screening.  If you are at high risk for breast cancer, talk with your health care provider about having an MRI and a mammogram every year.  Breast cancer (BRCA) gene test is recommended for women who have family members with BRCA-related cancers. Results of the assessment will determine the need for genetic counseling and BRCA1 and for BRCA2 testing. BRCA-related cancers include these types: ? Breast. This occurs in males or females. ? Ovarian. ? Tubal. This may also be called fallopian tube cancer. ? Cancer of the abdominal or pelvic lining (peritoneal cancer). ? Prostate. ? Pancreatic.  Cervical, Uterine, and Ovarian  Cancer Your health care provider may recommend that you be screened regularly for cancer of the pelvic organs. These include your ovaries, uterus, and vagina. This screening involves a pelvic exam, which includes checking for microscopic changes to the surface of your cervix (Pap test).  For women ages 21-65, health care providers may recommend a pelvic exam and a Pap test every three years. For women ages 78-65, they may recommend the Pap test and pelvic exam, combined with testing for human papilloma virus (HPV), every five years. Some types of HPV increase your risk of cervical cancer. Testing for HPV may also be done on women of any age who have unclear Pap test results.  Other health care providers may not recommend any screening for nonpregnant women who are considered low risk for pelvic cancer and have no symptoms. Ask your health care provider if a screening pelvic exam is right for you.  If you have had past treatment for cervical cancer or a condition that could lead to cancer, you need Pap tests and screening for cancer for at least 20 years after your treatment. If Pap tests have been discontinued for you, your risk factors (such as having a new sexual partner) need to be reassessed to determine if you should start having screenings again. Some women have medical problems that increase the chance of getting cervical cancer. In these cases, your health care provider may  recommend that you have screening and Pap tests more often.  If you have a family history of uterine cancer or ovarian cancer, talk with your health care provider about genetic screening.  If you have vaginal bleeding after reaching menopause, tell your health care provider.  There are currently no reliable tests available to screen for ovarian cancer.  Lung Cancer Lung cancer screening is recommended for adults 28-38 years old who are at high risk for lung cancer because of a history of smoking. A yearly low-dose CT scan  of the lungs is recommended if you:  Currently smoke.  Have a history of at least 30 pack-years of smoking and you currently smoke or have quit within the past 15 years. A pack-year is smoking an average of one pack of cigarettes per day for one year.  Yearly screening should:  Continue until it has been 15 years since you quit.  Stop if you develop a health problem that would prevent you from having lung cancer treatment.  Colorectal Cancer  This type of cancer can be detected and can often be prevented.  Routine colorectal cancer screening usually begins at age 16 and continues through age 20.  If you have risk factors for colon cancer, your health care provider may recommend that you be screened at an earlier age.  If you have a family history of colorectal cancer, talk with your health care provider about genetic screening.  Your health care provider may also recommend using home test kits to check for hidden blood in your stool.  A small camera at the end of a tube can be used to examine your colon directly (sigmoidoscopy or colonoscopy). This is done to check for the earliest forms of colorectal cancer.  Direct examination of the colon should be repeated every 5-10 years until age 10. However, if early forms of precancerous polyps or small growths are found or if you have a family history or genetic risk for colorectal cancer, you may need to be screened more often.  Skin Cancer  Check your skin from head to toe regularly.  Monitor any moles. Be sure to tell your health care provider: ? About any new moles or changes in moles, especially if there is a change in a mole's shape or color. ? If you have a mole that is larger than the size of a pencil eraser.  If any of your family members has a history of skin cancer, especially at a young age, talk with your health care provider about genetic screening.  Always use sunscreen. Apply sunscreen liberally and repeatedly  throughout the day.  Whenever you are outside, protect yourself by wearing long sleeves, pants, a wide-brimmed hat, and sunglasses.  What should I know about osteoporosis? Osteoporosis is a condition in which bone destruction happens more quickly than new bone creation. After menopause, you may be at an increased risk for osteoporosis. To help prevent osteoporosis or the bone fractures that can happen because of osteoporosis, the following is recommended:  If you are 87-87 years old, get at least 1,000 mg of calcium and at least 600 mg of vitamin D per day.  If you are older than age 63 but younger than age 60, get at least 1,200 mg of calcium and at least 600 mg of vitamin D per day.  If you are older than age 42, get at least 1,200 mg of calcium and at least 800 mg of vitamin D per day.  Smoking and excessive alcohol  intake increase the risk of osteoporosis. Eat foods that are rich in calcium and vitamin D, and do weight-bearing exercises several times each week as directed by your health care provider. What should I know about how menopause affects my mental health? Depression may occur at any age, but it is more common as you become older. Common symptoms of depression include:  Low or sad mood.  Changes in sleep patterns.  Changes in appetite or eating patterns.  Feeling an overall lack of motivation or enjoyment of activities that you previously enjoyed.  Frequent crying spells.  Talk with your health care provider if you think that you are experiencing depression. What should I know about immunizations? It is important that you get and maintain your immunizations. These include:  Tetanus, diphtheria, and pertussis (Tdap) booster vaccine.  Influenza every year before the flu season begins.  Pneumonia vaccine.  Shingles vaccine.  Your health care provider may also recommend other immunizations. This information is not intended to replace advice given to you by your health  care provider. Make sure you discuss any questions you have with your health care provider. Document Released: 07/02/2005 Document Revised: 11/28/2015 Document Reviewed: 02/11/2015 Elsevier Interactive Patient Education  2018 [employer].

## 2017-07-22 ENCOUNTER — Ambulatory Visit: Payer: Medicare Other | Admitting: Adult Health

## 2017-07-22 ENCOUNTER — Encounter: Payer: Self-pay | Admitting: Adult Health

## 2017-07-22 VITALS — BP 150/98 | Temp 98.4°F | Wt 220.0 lb

## 2017-07-22 DIAGNOSIS — I10 Essential (primary) hypertension: Secondary | ICD-10-CM | POA: Diagnosis not present

## 2017-07-22 DIAGNOSIS — R05 Cough: Secondary | ICD-10-CM | POA: Diagnosis not present

## 2017-07-22 DIAGNOSIS — R059 Cough, unspecified: Secondary | ICD-10-CM

## 2017-07-22 MED ORDER — BENZONATATE 200 MG PO CAPS: 200.0000 mg | ORAL_CAPSULE | Freq: Two times a day (BID) | ORAL | 1 refills | Status: DC | PRN

## 2017-07-22 MED ORDER — LOSARTAN POTASSIUM-HCTZ 100-25 MG PO TABS: 1.0000 | ORAL_TABLET | Freq: Every day | ORAL | 1 refills | Status: DC

## 2017-07-22 NOTE — Progress Notes (Signed)
Subjective:    Patient ID: Kristin Merritt, female    DOB: 1961-01-08, 57 y.o.   MRN: [id number]  HPI 57 year old female who  has a past medical history of Allergy, Anemia, Anxiety, Asthma, Constipation, Depression, GERD (gastroesophageal reflux disease), Heart murmur, High cholesterol, Hypertension, Leg cramps, Lichen sclerosus et atrophicus, Numbness and tingling in hands, Postmenopausal HRT (hormone replacement therapy) - followed by Dr. Toney Rakes in gyn (07/11/2012), Swelling of both hands, Uterine cancer ([place]) (1991), UTI (urinary tract infection), VIN III (vulvar intraepithelial neoplasia III), and VIN III (vulvar intraepithelial neoplasia III).  She presents to the office today for multiple issues   1. Hypertension - She is currently maintained Hyzaar 50-12.5 mg. She was seen by her GYN last week and her blood pressure was elevated at that visit. Today her blood pressure is also elevated. She has not been checking at home. Endorses episodes of blurred vision and dizziness.   BP Readings from Last 3 Encounters:  07/22/17 (!) 150/98  07/20/17 (!) 150/92  06/27/17 140/90   2. She has had a non productive cough for 3-4 days. Reports associated sore throat. Denies any fevers, or feeling ill. She does not have any sinus pain or pressure, or chest congestion.    Review of Systems   See HPI    Past Medical History:  Diagnosis Date  . Allergy   . Anemia    as child  . Anxiety   . Asthma   . Constipation   . Depression   . GERD (gastroesophageal reflux disease)   . Heart murmur   . High cholesterol   . Hypertension   . Leg cramps   . Lichen sclerosus et atrophicus   . Numbness and tingling in hands   . Postmenopausal HRT (hormone replacement therapy) - followed by Dr. Toney Rakes in gyn 07/11/2012  . Swelling of both hands   . Uterine cancer ([place]) 1991  . UTI (urinary tract infection)   . 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 VIN III (vulvar  intraepithelial neoplasia III)     Social History   Socioeconomic History  . Marital status: Widowed    Spouse name: Not on file  . Number of children: Not on file  . Years of education: Not on file  . Highest education level: Not on file  Social Needs  . Financial resource strain: Not on file  . Food insecurity - worry: Not on file  . Food insecurity - inability: Not on file  . Transportation needs - medical: Not on file  . Transportation needs - non-medical: Not on file  Occupational History  . Not on file  Tobacco Use  . Smoking status: Former Smoker    Packs/day: 0.25    Years: 20.00    Pack years: 5.00    Types: Cigarettes    Last attempt to quit: 02/21/2017    Years since quitting: 0.4  . Smokeless tobacco: Never Used  . Tobacco comment: 4 a day   Substance and Sexual Activity  . Alcohol use: Yes    Alcohol/week: 0.0 oz    Comment: rare  . Drug use: No  . Sexual activity: Yes    Partners: Male    Birth control/protection: Condom, Surgical    Comment: 1st intercourse- 17, partners- 41,   Other Topics Concern  . Not on file  Social History Narrative   Work or School: retired [employer]  Home Situation: lives alone       Spiritual Beliefs: Christain            Exercise: Has no motivation. Enjoys walking   Diet: Tries to watch what she eats. Does not eat a lot of processed foods or fast food.     Past Surgical History:  Procedure Laterality Date  . EYE SURGERY     "Lazy Muscle" x 2- one as child and another 70  . SPINE SURGERY     L5-s1  . VAGINAL HYSTERECTOMY    . VULVA /PERINEUM BIOPSY N/A 11/18/2015   Procedure: WIDE LOCAL EXCISION OF VULVA and MEDIAL LEFT THIGH LESION ;  Surgeon: Terrance Mass, MD;  Location: [facility];  Service: Gynecology;  Laterality: N/A;  . WISDOM TOOTH EXTRACTION      Family History  Problem Relation Age of Onset  . Cancer Mother        LUNG   . Breast cancer Mother        Died  . Esophageal cancer Mother     . Colon cancer Neg Hx   . Colon polyps Neg Hx   . Rectal cancer Neg Hx   . Stomach cancer Neg Hx     Allergies  Allergen Reactions  . Percocet [Oxycodone-Acetaminophen] Itching    Current Outpatient Medications on File Prior to Visit  Medication Sig Dispense Refill  . albuterol (PROVENTIL HFA;VENTOLIN HFA) 108 (90 BASE) MCG/ACT inhaler Inhale 2 puffs into the lungs every 6 (six) hours as needed.    Marland Kitchen aspirin EC 81 MG tablet Take 1 tablet (81 mg total) by mouth daily. 90 tablet 3  . atorvastatin (LIPITOR) 20 MG tablet TAKE 1 TABLET BY MOUTH EVERY DAY 90 tablet 0  . docusate sodium (COLACE) 50 MG capsule Take by mouth as needed for mild constipation or moderate constipation. Over the counter Equate stool softener    . EPIPEN 2-PAK 0.3 MG/0.3ML SOAJ injection     . estradiol (ESTRACE) 1 MG tablet Take 1 tablet by mouth daily 90 tablet 4  . fluticasone (FLONASE) 50 MCG/ACT nasal spray Place 2 sprays into both nostrils daily. 16 g 6  . Fluticasone-Salmeterol (ADVAIR) 250-50 MCG/DOSE AEPB Inhale 1 puff into the lungs as needed.    . lansoprazole (PREVACID) 30 MG capsule TAKE 1 CAPSULE BY MOUTH ONCE DAILY 90 capsule 3  . levocetirizine (XYZAL) 5 MG tablet Take 5 mg by mouth every evening.     . Melatonin 5 MG TABS Take 1 tablet by mouth at bedtime.    . montelukast (SINGULAIR) 10 MG tablet Take 10 mg by mouth at bedtime.     No current facility-administered medications on file prior to visit.     BP (!) 150/98   Temp 98.4 F (36.9 C) (Oral)   Wt 220 lb (99.8 kg)   BMI 35.51 kg/m       Objective:   Physical Exam  Constitutional: She is oriented to person, place, and time. She appears well-developed and well-nourished. No distress.  Cardiovascular: Normal rate, regular rhythm, normal heart sounds and intact distal pulses. Exam reveals no gallop and no friction rub.  No murmur heard. Pulmonary/Chest: Effort normal and breath sounds normal. No respiratory distress. She has no  wheezes. She has no rales. She exhibits no tenderness.  Neurological: She is alert and oriented to person, place, and time.  Skin: Skin is warm and dry. No rash noted. She is not diaphoretic. No erythema. No pallor.  Psychiatric:  She has a normal mood and affect. Her behavior is normal. Judgment and thought content normal.  Nursing note and vitals reviewed.      Assessment & Plan:   1. Essential hypertension - Will increase Hyzaar. Monitor BP at home. Follow up in 2 weeks  - losartan-hydrochlorothiazide (HYZAAR) 100-25 MG tablet; Take 1 tablet by mouth daily.  Dispense: 90 tablet; Refill: 1  2. Cough - likely viral  - benzonatate (TESSALON) 200 MG capsule; Take 1 capsule (200 mg total) by mouth 2 (two) times daily as needed for cough.  Dispense: 20 capsule; Refill: 1 - follow up as needed  Dorothyann Peng, NP

## 2017-07-26 LAB — PAP IG W/ RFLX HPV ASCU

## 2017-07-26 LAB — HUMAN PAPILLOMAVIRUS, HIGH RISK: HPV DNA HIGH RISK: NOT DETECTED

## 2017-07-27 ENCOUNTER — Other Ambulatory Visit: Payer: Self-pay | Admitting: Gynecology

## 2017-07-27 DIAGNOSIS — Z13820 Encounter for screening for osteoporosis: Secondary | ICD-10-CM

## 2017-07-29 ENCOUNTER — Other Ambulatory Visit: Payer: Self-pay | Admitting: Adult Health

## 2017-08-02 ENCOUNTER — Encounter: Payer: Self-pay | Admitting: Family Medicine

## 2017-08-02 NOTE — Telephone Encounter (Signed)
Sent to the pharmacy by e-scribe.  Pt had last lipid on 06/08/17.

## 2017-08-05 ENCOUNTER — Encounter: Payer: Self-pay | Admitting: Adult Health

## 2017-08-05 ENCOUNTER — Ambulatory Visit: Payer: Medicare Other | Admitting: Adult Health

## 2017-08-05 VITALS — BP 148/90 | Temp 97.8°F | Wt 216.0 lb

## 2017-08-05 DIAGNOSIS — Z760 Encounter for issue of repeat prescription: Secondary | ICD-10-CM | POA: Diagnosis not present

## 2017-08-05 DIAGNOSIS — I10 Essential (primary) hypertension: Secondary | ICD-10-CM | POA: Diagnosis not present

## 2017-08-05 MED ORDER — TOLNAFTATE 1 % EX CREA: 1.0000 "application " | TOPICAL_CREAM | Freq: Two times a day (BID) | CUTANEOUS | 3 refills | Status: DC

## 2017-08-05 MED ORDER — TAVABOROLE 5 % EX SOLN: CUTANEOUS | 2 refills | Status: DC

## 2017-08-05 NOTE — Progress Notes (Signed)
Subjective:    Patient ID: Kristin Merritt, female    DOB: Feb 07, 1961, 57 y.o.   MRN: [id number]  HPI Patient presents for follow-up of HTN.  Last office visit her BP was 150/90.  Her readings at home have ranged from 109-151/80s. Her other complaints are related to ongoing constipation and nail fungus. She has been using peri-colace thinking it was colace.  She also had been seeing a podiatrist and getting cream for her toenails.  She would like to see if we can start prescribing this as she is no longer able to afford the co-pays associated with the specialist.    Review of Systems  Respiratory: Negative for chest tightness and shortness of breath.   Cardiovascular: Negative for chest pain, palpitations and leg swelling.  Gastrointestinal: Positive for abdominal distention and constipation. Negative for abdominal pain, diarrhea, nausea and vomiting.  Neurological: Positive for headaches.      Objective:   Physical Exam  Constitutional: She appears well-developed and well-nourished. No distress.  Cardiovascular: Normal rate, regular rhythm, normal heart sounds and intact distal pulses. Exam reveals no gallop and no friction rub.  No murmur heard. Pulmonary/Chest: Effort normal and breath sounds normal. No respiratory distress. She has no wheezes. She has no rales.  Abdominal: Soft. Bowel sounds are normal. She exhibits no distension and no mass. There is no tenderness. There is no rebound and no guarding.  Skin: Skin is warm and dry. She is not diaphoretic.  Nursing note and vitals reviewed.     Assessment & Plan:  1. Essential hypertension Blood pressure is better controlled on current dose of Hyzaar.  Continue to keep BP readings and bring to next visit. Continue diet and exercise with weight loss.   2. Medication refill Refill medications - Tavaborole (KERYDIN) 5 % SOLN; Apply thin layer daily .  Dispense: 10 mL; Refill: 2 - tolnaftate (TINACTIN) 1 % cream; Apply 1 application  topically 2 (two) times daily.  Dispense: 30 g; Refill: 3   Follow up as needed.  Caelynn Marshman C Lycia Sachdeva BSN RN NP student

## 2017-08-05 NOTE — Progress Notes (Signed)
Subjective:    Patient ID: Kristin Merritt, female    DOB: [birth year], 57 y.o.   MRN: [id number]  HPI  57 year old female who  has a past medical history of Allergy, Anemia, Anxiety, Asthma, Constipation, Depression, GERD (gastroesophageal reflux disease), Heart murmur, High cholesterol, Hypertension, Leg cramps, Lichen sclerosus et atrophicus, Numbness and tingling in hands, Postmenopausal HRT (hormone replacement therapy) - followed by Dr. Toney Rakes in gyn (07/11/2012), Swelling of both hands, Uterine cancer ([place]) (1991), UTI (urinary tract infection), VIN III (vulvar intraepithelial neoplasia III), and VIN III (vulvar intraepithelial neoplasia III).  She presents to the office today for follow up regarding hypertension. Prior to the last visit she was prescribed Hyzaar 50-12.5 mg her BP had been elevated at her GYN office and here. Hyzaar was increased to 100-25  She has been monitoring her blood pressure at home and per her log, her blood pressure has been decreasing slowly. Most currently 130's/80's. She denies any signs or symptoms of hypotension. She has been working with a Marine scientist Readings from Last 3 Encounters:  08/05/17 216 lb (98 kg)  07/22/17 220 lb (99.8 kg)  07/20/17 217 lb (98.4 kg)     She also needs a refill of her toe nail fungus solution    Review of Systems   See HPI  Past Medical History:  Diagnosis Date  . Allergy   . Anemia    as child  . Anxiety   . Asthma   . Constipation   . Depression   . GERD (gastroesophageal reflux disease)   . Heart murmur   . High cholesterol   . Hypertension   . Leg cramps   . Lichen sclerosus et atrophicus   . Numbness and tingling in hands   . Postmenopausal HRT (hormone replacement therapy) - followed by Dr. Toney Rakes in gyn 07/11/2012  . Swelling of both hands   . Uterine cancer ([place]) 1991  . UTI (urinary tract infection)   . 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 VIN III  (vulvar intraepithelial neoplasia III)     Social History   Socioeconomic History  . Marital status: Widowed    Spouse name: Not on file  . Number of children: Not on file  . Years of education: Not on file  . Highest education level: Not on file  Social Needs  . Financial resource strain: Not on file  . Food insecurity - worry: Not on file  . Food insecurity - inability: Not on file  . Transportation needs - medical: Not on file  . Transportation needs - non-medical: Not on file  Occupational History  . Not on file  Tobacco Use  . Smoking status: Former Smoker    Packs/day: 0.25    Years: 20.00    Pack years: 5.00    Types: Cigarettes    Last attempt to quit: 02/21/2017    Years since quitting: 0.4  . Smokeless tobacco: Never Used  . Tobacco comment: 4 a day   Substance and Sexual Activity  . Alcohol use: Yes    Alcohol/week: 0.0 oz    Comment: rare  . Drug use: No  . Sexual activity: Yes    Partners: Male    Birth control/protection: Condom, Surgical    Comment: 1st intercourse- 17, partners- 33,   Other Topics Concern  . Not on file  Social History Narrative   Work or School: retired Programmer, applications  Home Situation: lives alone       Spiritual Beliefs: Christain            Exercise: Has no motivation. Enjoys walking   Diet: Tries to watch what she eats. Does not eat a lot of processed foods or fast food.     Past Surgical History:  Procedure Laterality Date  . EYE SURGERY     "Lazy Muscle" x 2- one as child and another 61  . SPINE SURGERY     L5-s1  . VAGINAL HYSTERECTOMY    . VULVA /PERINEUM BIOPSY N/A 11/18/2015   Procedure: WIDE LOCAL EXCISION OF VULVA and MEDIAL LEFT THIGH LESION ;  Surgeon: Terrance Mass, MD;  Location: [facility];  Service: Gynecology;  Laterality: N/A;  . WISDOM TOOTH EXTRACTION      Family History  Problem Relation Age of Onset  . Cancer Mother        LUNG   . Breast cancer Mother        Died  . Esophageal cancer  Mother   . Colon cancer Neg Hx   . Colon polyps Neg Hx   . Rectal cancer Neg Hx   . Stomach cancer Neg Hx     Allergies  Allergen Reactions  . Percocet [Oxycodone-Acetaminophen] Itching    Current Outpatient Medications on File Prior to Visit  Medication Sig Dispense Refill  . Albuterol Sulfate (PROAIR HFA IN) Inhale into the lungs. 1-2 puffs as needed every 4-6 hours for cough and wheezing    . aspirin EC 81 MG tablet Take 1 tablet (81 mg total) by mouth daily. 90 tablet 3  . atorvastatin (LIPITOR) 20 MG tablet TAKE 1 TABLET BY MOUTH EVERY DAY 90 tablet 2  . Azelastine-Fluticasone (DYMISTA) 137-50 MCG/ACT SUSP Place 1 puff into both nostrils daily.    Marland Kitchen docusate sodium (COLACE) 50 MG capsule Take by mouth as needed for mild constipation or moderate constipation. Over the counter Equate stool softener    . EPIPEN 2-PAK 0.3 MG/0.3ML SOAJ injection     . estradiol (ESTRACE) 1 MG tablet Take 1 tablet by mouth daily 90 tablet 4  . fluticasone (FLONASE) 50 MCG/ACT nasal spray Place 2 sprays into both nostrils daily. 16 g 6  . Fluticasone-Salmeterol (ADVAIR) 250-50 MCG/DOSE AEPB Inhale 1 puff into the lungs as needed.    . lansoprazole (PREVACID) 30 MG capsule TAKE 1 CAPSULE BY MOUTH ONCE DAILY 90 capsule 3  . levocetirizine (XYZAL) 5 MG tablet Take 5 mg by mouth every evening.     Marland Kitchen losartan-hydrochlorothiazide (HYZAAR) 100-25 MG tablet Take 1 tablet by mouth daily. 90 tablet 1  . Melatonin 5 MG TABS Take 1 tablet by mouth at bedtime.    . montelukast (SINGULAIR) 10 MG tablet Take 10 mg by mouth at bedtime.     No current facility-administered medications on file prior to visit.     BP (!) 148/90   Temp 97.8 F (36.6 C) (Oral)   Wt 216 lb (98 kg)   BMI 34.86 kg/m       Objective:   Physical Exam  Constitutional: She is oriented to person, place, and time. She appears well-developed and well-nourished. No distress.  Cardiovascular: Normal rate, regular rhythm, normal heart  sounds and intact distal pulses. Exam reveals no gallop and no friction rub.  No murmur heard. Pulmonary/Chest: Effort normal and breath sounds normal. No respiratory distress. She has no wheezes. She has no rales. She exhibits no tenderness.  Neurological:  She is alert and oriented to person, place, and time.  Skin: Skin is warm and dry. No rash noted. She is not diaphoretic. No erythema. No pallor.  Toenail fungus on right great toe   Psychiatric: She has a normal mood and affect. Her behavior is normal. Judgment and thought content normal.  Nursing note and vitals reviewed.     Assessment & Plan:  1. Essential hypertension - Continue with current dose of Hyzaar - Continue to monitor BP at home and let me know if BP is continuously above 150/90  - Congratulated on weight loss  2. Medication refill - Tavaborole (KERYDIN) 5 % SOLN; Apply thin layer daily .  Dispense: 10 mL; Refill: 2 - tolnaftate (TINACTIN) 1 % cream; Apply 1 application topically 2 (two) times daily.  Dispense: 30 g; Refill: 3   Dorothyann Peng, NP

## 2017-08-10 ENCOUNTER — Ambulatory Visit (INDEPENDENT_AMBULATORY_CARE_PROVIDER_SITE_OTHER): Payer: Medicare Other

## 2017-08-10 ENCOUNTER — Other Ambulatory Visit: Payer: Self-pay | Admitting: Gynecology

## 2017-08-10 ENCOUNTER — Encounter: Payer: Self-pay | Admitting: Gynecology

## 2017-08-10 DIAGNOSIS — Z780 Asymptomatic menopausal state: Secondary | ICD-10-CM

## 2017-08-10 DIAGNOSIS — Z13820 Encounter for screening for osteoporosis: Secondary | ICD-10-CM

## 2017-08-17 ENCOUNTER — Ambulatory Visit: Payer: Medicare Other | Admitting: Obstetrics & Gynecology

## 2017-08-21 ENCOUNTER — Other Ambulatory Visit: Payer: Self-pay | Admitting: Adult Health

## 2017-08-22 ENCOUNTER — Other Ambulatory Visit: Payer: Self-pay | Admitting: Adult Health

## 2017-08-23 NOTE — Telephone Encounter (Signed)
Kristin Merritt, message says this was discontinued by provider.  Your note in Jan mentions she takes this because of taking hctz. Please advise.

## 2017-08-23 NOTE — Telephone Encounter (Signed)
I want her to continue. Ok to refill for one year

## 2017-08-23 NOTE — Telephone Encounter (Signed)
Sent to the pharmacy by e-scribe as directed. 

## 2017-08-23 NOTE — Telephone Encounter (Signed)
Sent to the pharmacy by e-scribe. 

## 2017-09-07 ENCOUNTER — Ambulatory Visit: Payer: Medicare Other | Admitting: Obstetrics & Gynecology

## 2017-09-07 ENCOUNTER — Encounter: Payer: Self-pay | Admitting: Obstetrics & Gynecology

## 2017-09-07 VITALS — BP 134/88

## 2017-09-07 DIAGNOSIS — Z8544 Personal history of malignant neoplasm of other female genital organs: Secondary | ICD-10-CM | POA: Diagnosis not present

## 2017-09-07 DIAGNOSIS — A630 Anogenital (venereal) warts: Secondary | ICD-10-CM | POA: Diagnosis not present

## 2017-09-07 DIAGNOSIS — N890 Mild vaginal dysplasia: Secondary | ICD-10-CM

## 2017-09-07 DIAGNOSIS — C519 Malignant neoplasm of vulva, unspecified: Secondary | ICD-10-CM

## 2017-09-07 NOTE — Progress Notes (Signed)
    Kristin Merritt 1960/08/13 [id number]        57 y.o.  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  Status post total hysterectomy for Cervical Dysplasia.  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  Vulvar Bx with Dr Denman George in 06/2016 showed no dysplasia.    OB History  Gravida Para Term Preterm AB Living  1 1       1   SAB TAB Ectopic Multiple Live Births               # Outcome Date GA Lbr Len/2nd Weight Sex Delivery Anes PTL Lv  1 Para             Past medical history,surgical history, problem list, medications, allergies, family history and social history were all reviewed and documented in the EPIC chart.   Directed ROS with pertinent positives and negatives documented in the history of present illness/assessment and plan.  Exam:  Vitals:   09/07/17 1444  BP: 134/88   General appearance:  Normal  Colposcopy Procedure Note Kristin Merritt 09/07/2017  Indications: Pap test of the vagina 07/20/2017 ASCUS/HPV HR negative.  Status post total hysterectomy for Cervical Dysplasia.  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  Speculum placed in vagina.  Acetic acid on the vagina and vulva.  Findings:   Vaginal colposcopy:  Mild AW with punctation at anterior vaginal vault at 12 O'clock.  Hurricane spray.  Biopsy taken.  Silver Nitrate.  Vulvar colposcopy:  Condylomatous lesion at hymenal caruncle left mid aspect.  Mild AW with punctation at at right inner posterior vulva.  Lidocaine 1% at both locations.  Biopsies taken with the knife.  Separate stitches with Vicryl 4-0.  Good hemostasis.  Perirectal colposcopy: Normal   Specimens: Vaginal vault Bx at 12 O'clock.  Bxs of the Rt post inner vulva and hymen at 3  O'clock.  Complications: None . Plan:  Per Bx results   Assessment/Plan:  57 y.o. G1P1   1. H/O Microinvasive Vulva cancer [facility]) Colposcopy counseling and findings discussed with patient.  Will manage per Bx results.   - Pathology Report  Princess Bruins MD, 2:47 PM 09/07/2017

## 2017-09-08 ENCOUNTER — Encounter: Payer: Self-pay | Admitting: Obstetrics & Gynecology

## 2017-09-08 NOTE — Patient Instructions (Signed)
1. H/O Microinvasive Vulva cancer [facility]) Colposcopy counseling and findings discussed with patient.  Will manage per Bx results.   - Pathology Report   Kristin Merritt, good seeing you today!  I will inform you of your results as soon as they are available.

## 2017-09-12 ENCOUNTER — Ambulatory Visit: Payer: Medicare Other | Admitting: Family Medicine

## 2017-09-12 ENCOUNTER — Encounter: Payer: Self-pay | Admitting: Family Medicine

## 2017-09-12 VITALS — BP 136/70 | HR 102 | Temp 98.2°F | Wt 212.0 lb

## 2017-09-12 DIAGNOSIS — J069 Acute upper respiratory infection, unspecified: Secondary | ICD-10-CM | POA: Diagnosis not present

## 2017-09-12 DIAGNOSIS — J302 Other seasonal allergic rhinitis: Secondary | ICD-10-CM

## 2017-09-12 DIAGNOSIS — B9789 Other viral agents as the cause of diseases classified elsewhere: Secondary | ICD-10-CM | POA: Diagnosis not present

## 2017-09-12 LAB — TISSUE PATH REPORT 10802

## 2017-09-12 LAB — PATHOLOGY

## 2017-09-12 NOTE — Progress Notes (Signed)
Subjective:    Patient ID: Kristin Merritt, female    DOB: August 17, 1960, 57 y.o.   MRN: [id number]  Chief Complaint  Patient presents with  . Cough    HPI Patient was seen today for acute concern.  Pt is typically followed by Dorothyann Peng, NP.  Pt endorses head congestion, coughing, aches, fever Tmax 101.6, burning eyes since Saturday.  Pt states she went to [facility] on Thursday and on the way back Saturday, her symptoms started.  Pt states others on the trip with her also are sick.  Pt has not taken her xyzal or singulair since Thursday.  She took nyquil last night.  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  Past Medical History:  Diagnosis Date  . Allergy   . Anemia    as child  . Anxiety   . Asthma   . Constipation   . Depression   . GERD (gastroesophageal reflux disease)   . Heart murmur   . High cholesterol   . Hypertension   . Leg cramps   . Lichen sclerosus et atrophicus   . Numbness and tingling in hands   . Postmenopausal HRT (hormone replacement therapy) - followed by Dr. Toney Rakes in gyn 07/11/2012  . Swelling of both hands   . Uterine cancer ([place]) 1991  . UTI (urinary tract infection)   . 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 VIN III (vulvar intraepithelial neoplasia III)     Allergies  Allergen Reactions  . Percocet [Oxycodone-Acetaminophen] Itching    ROS General: Denies chills, night sweats, changes in weight, changes in appetite  +fever, aches HEENT: Denies headaches, ear pain, changes in vision    +rhinorrhea, sore throat, sneezing, head congestion, burning eyes CV: Denies CP, palpitations, SOB, orthopnea Pulm: Denies SOB, wheezing  +cough GI: Denies abdominal pain, nausea, vomiting, diarrhea, constipation GU: Denies dysuria, hematuria, frequency, vaginal discharge Msk: Denies muscle cramps, joint pains Neuro: Denies weakness, numbness, tingling Skin: Denies rashes, bruising Psych:  Denies depression, anxiety, hallucinations     Objective:    Blood pressure 136/70, pulse (!) 102, temperature 98.2 F (36.8 C), temperature source Oral, weight 212 lb (96.2 kg), SpO2 96 %.   Gen. Pleasant, well-nourished, in no distress, normal affect   HEENT: [place]/AT, face symmetric, conjunctiva clear, no scleral icterus, PERRLA, EOMI, nares patent with mild drainage, pharynx with mild erythema, no exudate.  TMs full bilaterally.  No cervical lymphadenopathy. Lungs: no accessory muscle use, CTAB, no wheezes or rales Cardiovascular: RRR, no m/r/g, no peripheral edema Abdomen: BS present, soft, NT/ND Neuro:  A&Ox3, CN II-XII intact, normal gait   Wt Readings from Last 3 Encounters:  09/12/17 212 lb (96.2 kg)  08/05/17 216 lb (98 kg)  07/22/17 220 lb (99.8 kg)    Lab Results  Component Value Date   WBC 7.1 06/08/2017   HGB 12.9 06/08/2017   HCT 41.2 06/08/2017   PLT 175.0 06/08/2017   GLUCOSE 102 (H) 06/08/2017   CHOL 172 06/08/2017   TRIG 91.0 06/08/2017   HDL 87.90 06/08/2017   LDLCALC 66 06/08/2017   ALT 19 06/08/2017   AST 19 06/08/2017   NA 141 06/08/2017   K 4.1 06/08/2017   CL 101 06/08/2017   CREATININE 0.89 06/08/2017   BUN 12 06/08/2017   CO2 31 06/08/2017   TSH 0.74 06/08/2017   HGBA1C 6.0 06/08/2017    Assessment/Plan:  Viral URI with cough -supportive care -ok to take Tylenol prn for pain/discomfort or fever -pt advised ok to return to work 24 hrs after fever resolves.  Seasonal allergies -pt advised to start taking xyzal and singulair -ok to use flonase  F/u prn  Grier Mitts, MD

## 2017-12-27 ENCOUNTER — Encounter: Payer: Medicare Other | Admitting: Adult Health

## 2017-12-27 NOTE — Progress Notes (Deleted)
   Subjective:    Patient ID: Kristin Merritt, female    DOB: 1960-09-23, 57 y.o.   MRN: [id number]  HPI Patient presents for yearly preventative medicine examination. She is a pleasant 57 year old female who  has a past medical history of Allergy, Anemia, Anxiety, Asthma, Constipation, Depression, GERD (gastroesophageal reflux disease), Heart murmur, High cholesterol, Hypertension, Leg cramps, Lichen sclerosus et atrophicus, Numbness and tingling in hands, Postmenopausal HRT (hormone replacement therapy) - followed by Dr. Toney Rakes in gyn (07/11/2012), Swelling of both hands, Uterine cancer ([place]) (1991), UTI (urinary tract infection), VIN III (vulvar intraepithelial neoplasia III), and VIN III (vulvar intraepithelial neoplasia III).     All immunizations and health maintenance protocols were reviewed with the patient and needed orders were placed.  Appropriate screening laboratory values were ordered for the patient including screening of hyperlipidemia, renal function and hepatic function. If indicated by BPH, a PSA was ordered.  Medication reconciliation,  past medical history, social history, problem list and allergies were reviewed in detail with the patient  Goals were established with regard to weight loss, exercise, and  diet in compliance with medications  End of life planning was discussed.     Review of Systems     Objective:   Physical Exam        Assessment & Plan:

## 2018-01-05 ENCOUNTER — Telehealth: Payer: Self-pay | Admitting: Adult Health

## 2018-01-05 DIAGNOSIS — I10 Essential (primary) hypertension: Secondary | ICD-10-CM

## 2018-01-05 MED ORDER — LOSARTAN POTASSIUM-HCTZ 100-25 MG PO TABS: 1.0000 | ORAL_TABLET | Freq: Every day | ORAL | 1 refills | Status: DC

## 2018-01-05 NOTE — Telephone Encounter (Signed)
Copied from [place]. Topic: Quick Communication - Rx Refill/Question >> Jan 05, 2018  2:53 PM Bea Graff, NT wrote: Medication: losartan-hydrochlorothiazide (HYZAAR) 100-25 MG tablet  Has the patient contacted their pharmacy? Yes.   (Agent: If no, request that the patient contact the pharmacy for the refill.) (Agent: If yes, when and what did the pharmacy advise?)  Preferred Pharmacy (with phone number or street name): [id number] - [place], [place] - [address] [place] [address] [phone] (Phone) [phone] (Fax)    Agent: Please be advised that RX refills may take up to 3 business days. We ask that you follow-up with your pharmacy.

## 2018-01-30 ENCOUNTER — Other Ambulatory Visit: Payer: Self-pay | Admitting: Adult Health

## 2018-01-30 DIAGNOSIS — I10 Essential (primary) hypertension: Secondary | ICD-10-CM

## 2018-01-31 NOTE — Telephone Encounter (Signed)
Spoke to the pt.  She is now living in [place].  Refill to local pharmacy is not needed.

## 2018-03-29 ENCOUNTER — Other Ambulatory Visit: Payer: Self-pay | Admitting: Adult Health

## 2018-06-20 ENCOUNTER — Other Ambulatory Visit: Payer: Self-pay | Admitting: Adult Health

## 2018-06-20 NOTE — Telephone Encounter (Signed)
Denied.  Filled on 08/23/2017 for 1 year.  Refill request is early.

## 2018-07-16 ENCOUNTER — Other Ambulatory Visit: Payer: Self-pay | Admitting: Adult Health

## 2018-07-18 ENCOUNTER — Encounter: Payer: Self-pay | Admitting: Family Medicine

## 2018-07-18 NOTE — Telephone Encounter (Signed)
Sent to the pharmacy by e-scribe for 30 days.  Message on bottle and letter mailed.

## 2018-08-18 ENCOUNTER — Other Ambulatory Visit: Payer: Self-pay | Admitting: Adult Health

## 2018-08-21 NOTE — Telephone Encounter (Signed)
Denied.  Pt has moved to [place].  Nothing further needed.

## 2018-08-30 ENCOUNTER — Other Ambulatory Visit: Payer: Self-pay | Admitting: Adult Health

## 2018-08-31 NOTE — Telephone Encounter (Signed)
Denied.  Pt is now living in [place].  No longer under provider care.

## 2018-09-13 ENCOUNTER — Other Ambulatory Visit: Payer: Self-pay | Admitting: Adult Health

## 2018-10-02 ENCOUNTER — Other Ambulatory Visit: Payer: Self-pay | Admitting: *Deleted

## 2018-11-06 ENCOUNTER — Other Ambulatory Visit: Payer: Self-pay | Admitting: Obstetrics & Gynecology

## 2019-02-20 ENCOUNTER — Encounter: Payer: Self-pay | Admitting: Gynecology

## 2020-02-12 ENCOUNTER — Encounter: Payer: Self-pay | Admitting: Adult Health

## 2020-02-12 ENCOUNTER — Ambulatory Visit: Payer: Medicare Other | Admitting: Adult Health

## 2020-02-12 ENCOUNTER — Other Ambulatory Visit: Payer: Self-pay

## 2020-02-12 VITALS — BP 130/82 | HR 80 | Temp 98.4°F | Wt 215.0 lb

## 2020-02-12 DIAGNOSIS — J301 Allergic rhinitis due to pollen: Secondary | ICD-10-CM | POA: Diagnosis not present

## 2020-02-12 DIAGNOSIS — J4520 Mild intermittent asthma, uncomplicated: Secondary | ICD-10-CM

## 2020-02-12 DIAGNOSIS — I10 Essential (primary) hypertension: Secondary | ICD-10-CM | POA: Diagnosis not present

## 2020-02-12 DIAGNOSIS — E785 Hyperlipidemia, unspecified: Secondary | ICD-10-CM | POA: Diagnosis not present

## 2020-02-12 NOTE — Progress Notes (Signed)
Subjective:    Patient ID: Kristin Merritt, female    DOB: Mar 31, 1961, 59 y.o.   MRN: [id number]  HPI   59 year old female who  has a past medical history of Allergy, Anemia, Anxiety, Asthma, Constipation, Depression, GERD (gastroesophageal reflux disease), Heart murmur, High cholesterol, Hypertension, Leg cramps, Lichen sclerosus et atrophicus, Numbness and tingling in hands, Postmenopausal HRT (hormone replacement therapy) - followed by Dr. Lily Peer in gyn (07/11/2012), Swelling of both hands, Uterine cancer (HCC) (1991), UTI (urinary tract infection), VIN III (vulvar intraepithelial neoplasia III), and VIN III (vulvar intraepithelial neoplasia III).  She recently moved back from [place] where she spent the last two years. She has been back in [place] for the last two weeks and is back to working.   She reports that her medications have not changed  She continues to take losartan-hydrochlorothiazide 100-25 mg for essential hypertension.  She is supplemented with K-Dur 20 mEq prevent hypokalemia. BP Readings from Last 3 Encounters:  02/12/20 130/82  09/12/17 136/70  09/07/17 134/88   She also continues to take Lipitor 20 mg for high cholesterol Lab Results  Component Value Date   CHOL 172 06/08/2017   HDL 87.90 06/08/2017   LDLCALC 66 06/08/2017   TRIG 91.0 06/08/2017   CHOLHDL 2 06/08/2017   The past she has been seen by an allergist for seasonal allergies and asthma.  She does report that she is doing well with Singulair, Flonase, and albuterol inhaler as needed   Review of Systems  Constitutional: Negative.   HENT: Negative.   Eyes: Negative.   Respiratory: Negative.   Cardiovascular: Negative.   Gastrointestinal: Negative.   Endocrine: Negative.   Genitourinary: Negative.   Musculoskeletal: Negative.   Skin: Negative.   Allergic/Immunologic: Negative.   Neurological: Negative.   Hematological: Negative.   Psychiatric/Behavioral: Negative.      Past Medical  History:  Diagnosis Date  . Allergy   . Anemia    as child  . Anxiety   . Asthma   . Constipation   . Depression   . GERD (gastroesophageal reflux disease)   . Heart murmur   . High cholesterol   . Hypertension   . Leg cramps   . Lichen sclerosus et atrophicus   . Numbness and tingling in hands   . Postmenopausal HRT (hormone replacement therapy) - followed by Dr. Lily Peer in gyn 07/11/2012  . Swelling of both hands   . Uterine cancer (HCC) 1991  . UTI (urinary tract infection)   . 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 VIN III (vulvar intraepithelial neoplasia III)     Social History   Socioeconomic History  . Marital status: Widowed    Spouse name: Not on file  . Number of children: Not on file  . Years of education: Not on file  . Highest education level: Not on file  Occupational History  . Not on file  Tobacco Use  . Smoking status: Former Smoker    Packs/day: 0.25    Years: 20.00    Pack years: 5.00    Types: Cigarettes    Quit date: 02/21/2017    Years since quitting: 2.9  . Smokeless tobacco: Never Used  . Tobacco comment: 4 a day   Vaping Use  . Vaping Use: Never used  Substance and Sexual Activity  . Alcohol use: Yes    Alcohol/week: 0.0 standard drinks    Comment: rare  . Drug use: No  .  Sexual activity: Yes    Partners: Male    Birth control/protection: Condom, Surgical    Comment: 1st intercourse- 17, partners- 7,   Other Topics Concern  . Not on file  Social History Narrative   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 Enjoys walking   Diet: Tries to watch what she eats. Does not eat a lot of processed foods or fast food.    Social Determinants of Health   Financial Resource Strain:   . Difficulty of Paying Living Expenses: Not on file  Food Insecurity:   . Worried About Programme researcher, broadcasting/film/video in  the Last Year: Not on file  . Ran Out of Food in the Last Year: Not on file  Transportation Needs:   . Lack of Transportation (Medical): Not on file  . Lack of Transportation (Non-Medical): Not on file  Physical Activity:   . Days of Exercise per Week: Not on file  . Minutes of Exercise per Session: Not on file  Stress:   . Feeling of Stress : Not on file  Social Connections:   . Frequency of Communication with Friends and Family: Not on file  . Frequency of Social Gatherings with Friends and Family: Not on file  . Attends Religious Services: Not on file  . Active Member of Clubs or Organizations: Not on file  . Attends Banker Meetings: Not on file  . Marital Status: Not on file  Intimate Partner Violence:   . Fear of Current or Ex-Partner: Not on file  . Emotionally Abused: Not on file  . Physically Abused: Not on file  . Sexually Abused: Not on file    Past Surgical History:  Procedure Laterality Date  . EYE SURGERY     "Lazy Muscle" x 2- one as child and another 89  . SPINE SURGERY     L5-s1  . VAGINAL HYSTERECTOMY    . VULVA /PERINEUM BIOPSY N/A 11/18/2015   Procedure: WIDE LOCAL EXCISION OF VULVA and MEDIAL LEFT THIGH LESION ;  Surgeon: Ok Edwards, MD;  Location: [facility];  Service: Gynecology;  Laterality: N/A;  . WISDOM TOOTH EXTRACTION      Family History  Problem Relation Age of Onset  . Cancer Mother        LUNG   . Breast cancer Mother        Died  . Esophageal cancer Mother   . Colon cancer Neg Hx   . Colon polyps Neg Hx   . Rectal cancer Neg Hx   . Stomach cancer Neg Hx     Allergies  Allergen Reactions  . Percocet [Oxycodone-Acetaminophen] Itching    Current Outpatient Medications on File Prior to Visit  Medication Sig Dispense Refill  . Albuterol Sulfate (PROAIR HFA IN) Inhale into the lungs. 1-2 puffs as needed every 4-6 hours for cough and wheezing    . aspirin EC 81 MG tablet Take 1 tablet (81 mg total) by mouth daily. 90  tablet 3  . atorvastatin (LIPITOR) 20 MG tablet TAKE 1 TABLET BY MOUTH EVERY DAY 90 tablet 0  . Azelastine-Fluticasone (DYMISTA) 137-50 MCG/ACT SUSP Place 1 puff into both nostrils daily.    Marland Kitchen docusate sodium (COLACE) 50 MG capsule Take by mouth as needed for mild constipation or moderate constipation. Over  the counter Equate stool softener    . EPIPEN 2-PAK 0.3 MG/0.3ML SOAJ injection     . estradiol (ESTRACE) 1 MG tablet Take 1 tablet by mouth daily 90 tablet 4  . fluticasone (FLONASE) 50 MCG/ACT nasal spray Place 2 sprays into both nostrils daily. 16 g 6  . Fluticasone-Salmeterol (ADVAIR) 250-50 MCG/DOSE AEPB Inhale 1 puff into the lungs as needed.    . hydrochlorothiazide (HYDRODIURIL) 25 MG tablet Take 1 tab daily.  **DUE FOR CPX WITH Kaleia Longhi** 30 tablet 0  . KLOR-CON M20 20 MEQ tablet TAKE 1 TABLET BY MOUTH EVERY DAY 90 tablet 3  . lansoprazole (PREVACID) 30 MG capsule TAKE 1 CAPSULE BY MOUTH ONCE DAILY 90 capsule 0  . levocetirizine (XYZAL) 5 MG tablet Take 5 mg by mouth every evening.     Marland Kitchen losartan (COZAAR) 100 MG tablet Take 1 tab daily.  **DUE FOR CPX WITH Raymon Schlarb** 30 tablet 0  . losartan-hydrochlorothiazide (HYZAAR) 100-25 MG tablet Take 1 tablet by mouth daily. 90 tablet 1  . Melatonin 5 MG TABS Take 1 tablet by mouth at bedtime.    . montelukast (SINGULAIR) 10 MG tablet Take 10 mg by mouth at bedtime.    . Tavaborole (KERYDIN) 5 % SOLN Apply thin layer daily . 10 mL 2  . tolnaftate (TINACTIN) 1 % cream Apply 1 application topically 2 (two) times daily. 30 g 3   No current facility-administered medications on file prior to visit.    BP 130/82 (BP Location: Left Arm, Patient Position: Sitting, Cuff Size: Normal)   Pulse 80   Temp 98.4 F (36.9 C) (Oral)   Wt 215 lb (97.5 kg)   SpO2 97%   BMI 34.70 kg/m       Objective:   Physical Exam Vitals and nursing note reviewed.  Constitutional:      Appearance: Normal appearance. She is obese.  Cardiovascular:     Rate and Rhythm:  Normal rate and regular rhythm.     Pulses: Normal pulses.     Heart sounds: Normal heart sounds.  Pulmonary:     Effort: Pulmonary effort is normal.     Breath sounds: Normal breath sounds.  Musculoskeletal:        General: Normal range of motion.  Skin:    General: Skin is warm and dry.     Capillary Refill: Capillary refill takes less than 2 seconds.  Neurological:     General: No focal deficit present.     Mental Status: She is alert and oriented to person, place, and time.  Psychiatric:        Mood and Affect: Mood normal.        Behavior: Behavior normal.        Thought Content: Thought content normal.        Judgment: Judgment normal.        Assessment & Plan:  She will follow-up for her CPE which time we will get some updated blood work.  She does not need any medication refills and was advised to defy [place] if she runs out of any medications prior to her physical exam.  Shirline Frees, NP

## 2020-02-12 NOTE — Patient Instructions (Signed)
It was great seeing you today and welcome back!   Please schedule your physical   Let me know if you need anything refilled

## 2020-04-02 ENCOUNTER — Encounter: Payer: Self-pay | Admitting: Adult Health

## 2020-04-02 ENCOUNTER — Ambulatory Visit (INDEPENDENT_AMBULATORY_CARE_PROVIDER_SITE_OTHER): Payer: Medicare Other | Admitting: Adult Health

## 2020-04-02 ENCOUNTER — Other Ambulatory Visit: Payer: Self-pay | Admitting: Adult Health

## 2020-04-02 ENCOUNTER — Other Ambulatory Visit: Payer: Self-pay

## 2020-04-02 VITALS — BP 120/90 | HR 80 | Temp 99.0°F | Ht 66.0 in | Wt 214.0 lb

## 2020-04-02 DIAGNOSIS — E785 Hyperlipidemia, unspecified: Secondary | ICD-10-CM

## 2020-04-02 DIAGNOSIS — Z0000 Encounter for general adult medical examination without abnormal findings: Secondary | ICD-10-CM | POA: Diagnosis not present

## 2020-04-02 DIAGNOSIS — J301 Allergic rhinitis due to pollen: Secondary | ICD-10-CM | POA: Diagnosis not present

## 2020-04-02 DIAGNOSIS — I10 Essential (primary) hypertension: Secondary | ICD-10-CM

## 2020-04-02 DIAGNOSIS — Z1231 Encounter for screening mammogram for malignant neoplasm of breast: Secondary | ICD-10-CM

## 2020-04-02 LAB — COMPREHENSIVE METABOLIC PANEL
ALT: 22 U/L (ref 0–35)
AST: 19 U/L (ref 0–37)
Albumin: 4.4 g/dL (ref 3.5–5.2)
Alkaline Phosphatase: 77 U/L (ref 39–117)
BUN: 14 mg/dL (ref 6–23)
CO2: 33 mEq/L — ABNORMAL HIGH (ref 19–32)
Calcium: 9.4 mg/dL (ref 8.4–10.5)
Chloride: 101 mEq/L (ref 96–112)
Creatinine, Ser: 0.94 mg/dL (ref 0.40–1.20)
GFR: 66.56 mL/min (ref >60.00)
Glucose, Bld: 110 mg/dL — ABNORMAL HIGH (ref 70–99)
Potassium: 3.9 mEq/L (ref 3.5–5.1)
Sodium: 141 mEq/L (ref 135–145)
Total Bilirubin: 0.5 mg/dL (ref 0.2–1.2)
Total Protein: 6.4 g/dL (ref 6.0–8.3)

## 2020-04-02 LAB — CBC WITH DIFFERENTIAL/PLATELET
Basophils Absolute: 0 10*3/uL (ref 0.0–0.1)
Basophils Relative: 0.5 % (ref 0.0–3.0)
Eosinophils Absolute: 0.1 10*3/uL (ref 0.0–0.7)
Eosinophils Relative: 1.7 % (ref 0.0–5.0)
HCT: 38.4 % (ref 36.0–46.0)
Hemoglobin: 12.2 g/dL (ref 12.0–15.0)
Lymphocytes Relative: 38 % (ref 12.0–46.0)
Lymphs Abs: 2.2 10*3/uL (ref 0.7–4.0)
MCHC: 31.7 g/dL (ref 30.0–36.0)
MCV: 77.4 fl — ABNORMAL LOW (ref 78.0–100.0)
Monocytes Absolute: 0.4 10*3/uL (ref 0.1–1.0)
Monocytes Relative: 7 % (ref 3.0–12.0)
Neutro Abs: 3.1 10*3/uL (ref 1.4–7.7)
Neutrophils Relative %: 52.8 % (ref 43.0–77.0)
Platelets: 181 10*3/uL (ref 150.0–400.0)
RBC: 4.95 Mil/uL (ref 3.87–5.11)
RDW: 15.7 % — ABNORMAL HIGH (ref 11.5–15.5)
WBC: 5.8 10*3/uL (ref 4.0–10.5)

## 2020-04-02 LAB — LIPID PANEL
Cholesterol: 165 mg/dL (ref 0–200)
HDL: 70.2 mg/dL (ref >39.00)
LDL Cholesterol: 82 mg/dL (ref 0–99)
NonHDL: 94.63
Total CHOL/HDL Ratio: 2
Triglycerides: 62 mg/dL (ref 0.0–149.0)
VLDL: 12.4 mg/dL (ref 0.0–40.0)

## 2020-04-02 LAB — TSH: TSH: 0.65 u[IU]/mL (ref 0.35–4.50)

## 2020-04-02 LAB — HEMOGLOBIN A1C: Hgb A1c MFr Bld: 6.4 % (ref 4.6–6.5)

## 2020-04-02 MED ORDER — ATORVASTATIN CALCIUM 20 MG PO TABS: 20.0000 mg | ORAL_TABLET | Freq: Every day | ORAL | 3 refills | Status: DC

## 2020-04-02 MED ORDER — POTASSIUM CHLORIDE CRYS ER 20 MEQ PO TBCR: 20.0000 meq | EXTENDED_RELEASE_TABLET | Freq: Every day | ORAL | 3 refills | Status: DC

## 2020-04-02 MED ORDER — LOSARTAN POTASSIUM-HCTZ 100-25 MG PO TABS: 1.0000 | ORAL_TABLET | Freq: Every day | ORAL | 3 refills | Status: DC

## 2020-04-02 MED ORDER — METFORMIN HCL 500 MG PO TABS: 500.0000 mg | ORAL_TABLET | Freq: Every day | ORAL | 1 refills | Status: DC

## 2020-04-02 NOTE — Progress Notes (Signed)
Subjective:    Patient ID: Kristin Merritt, female    DOB: 17-Sep-1960, 59 y.o.   MRN: [id number]  HPI Patient presents for yearly preventative medicine examination. She is a pleasant 59 year old female who  has a past medical history of Allergy, Anemia, Anxiety, Asthma, Constipation, Depression, GERD (gastroesophageal reflux disease), Heart murmur, High cholesterol, Hypertension, Leg cramps, Lichen sclerosus et atrophicus, Numbness and tingling in hands, Postmenopausal HRT (hormone replacement therapy) - followed by Dr. Toney Rakes in gyn (07/11/2012), Swelling of both hands, Uterine cancer ([place]) (1991), UTI (urinary tract infection), VIN III (vulvar intraepithelial neoplasia III), and VIN III (vulvar intraepithelial neoplasia III).  Hyperlipidemia -is currently prescribed Lipitor 20 mg daily. She denies myalgia or fatigue  Hypertension -early prescribed losartan/hydrochlorothiazide 100-25 mg. She is also supplemented with K-Dur 20 mEq to provide hypokalemia.  Seasonal Allergies-seen by an allergy and asthma. She is doing well with Singulair, Flonase, and Advair 250-50 mg  All immunizations and health maintenance protocols were reviewed with the patient and needed orders were placed.  Appropriate screening laboratory values were ordered for the patient including screening of hyperlipidemia, renal function and hepatic function.   Medication reconciliation,  past medical history, social history, problem list and allergies were reviewed in detail with the patient  Goals were established with regard to weight loss, exercise, and  diet in compliance with medications Wt Readings from Last 3 Encounters:  04/02/20 214 lb (97.1 kg)  02/12/20 215 lb (97.5 kg)  09/12/17 212 lb (96.2 kg)   Denies acute issues   Is up to date on colon cancer screening. Has not been seen by allergist or GYN since moving back to [place] but plans to do so.   Review of Systems  Constitutional: Negative.   HENT: Negative.     Eyes: Negative.   Respiratory: Negative.   Cardiovascular: Negative.   Gastrointestinal: Negative.   Endocrine: Negative.   Genitourinary: Negative.   Musculoskeletal: Negative.   Skin: Negative.   Allergic/Immunologic: Negative.   Neurological: Negative.   Hematological: Negative.   Psychiatric/Behavioral: Negative.    Past Medical History:  Diagnosis Date  . Allergy   . Anemia    as child  . Anxiety   . Asthma   . Constipation   . Depression   . GERD (gastroesophageal reflux disease)   . Heart murmur   . High cholesterol   . Hypertension   . Leg cramps   . Lichen sclerosus et atrophicus   . Numbness and tingling in hands   . Postmenopausal HRT (hormone replacement therapy) - followed by Dr. Toney Rakes in gyn 07/11/2012  . Swelling of both hands   . Uterine cancer ([place]) 1991  . UTI (urinary tract infection)   . 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 VIN III (vulvar intraepithelial neoplasia III)     Social History   Socioeconomic History  . Marital status: Widowed    Spouse name: Not on file  . Number of children: Not on file  . Years of education: Not on file  . Highest education level: Not on file  Occupational History  . Not on file  Tobacco Use  . Smoking status: Former Smoker    Packs/day: 0.25    Years: 20.00    Pack years: 5.00    Types: Cigarettes    Quit date: 02/21/2017    Years since quitting: 3.1  . Smokeless tobacco: Never Used  . Tobacco comment: 4 a day  Vaping Use  . Vaping Use: Never used  Substance and Sexual Activity  . Alcohol use: Yes    Alcohol/week: 0.0 standard drinks    Comment: rare  . Drug use: No  . Sexual activity: Yes    Partners: Male    Birth control/protection: Condom, Surgical    Comment: 1st intercourse- 17, partners- 62,   Other Topics Concern  . Not on file  Social History Narrative   Work or School: retired Research scientist (medical) Situation: lives alone       Spiritual  Beliefs: Christain            Exercise: Has no motivation. Enjoys walking   Diet: Tries to watch what she eats. Does not eat a lot of processed foods or fast food.    Social Determinants of Health   Financial Resource Strain:   . Difficulty of Paying Living Expenses: Not on file  Food Insecurity:   . Worried About Charity fundraiser in the Last Year: Not on file  . Ran Out of Food in the Last Year: Not on file  Transportation Needs:   . Lack of Transportation (Medical): Not on file  . Lack of Transportation (Non-Medical): Not on file  Physical Activity:   . Days of Exercise per Week: Not on file  . Minutes of Exercise per Session: Not on file  Stress:   . Feeling of Stress : Not on file  Social Connections:   . Frequency of Communication with Friends and Family: Not on file  . Frequency of Social Gatherings with Friends and Family: Not on file  . Attends Religious Services: Not on file  . Active Member of Clubs or Organizations: Not on file  . Attends Archivist Meetings: Not on file  . Marital Status: Not on file  Intimate Partner Violence:   . Fear of Current or Ex-Partner: Not on file  . Emotionally Abused: Not on file  . Physically Abused: Not on file  . Sexually Abused: Not on file    Past Surgical History:  Procedure Laterality Date  . EYE SURGERY     "Lazy Muscle" x 2- one as child and another 34  . SPINE SURGERY     L5-s1  . VAGINAL HYSTERECTOMY    . VULVA /PERINEUM BIOPSY N/A 11/18/2015   Procedure: WIDE LOCAL EXCISION OF VULVA and MEDIAL LEFT THIGH LESION ;  Surgeon: Terrance Mass, MD;  Location: [facility];  Service: Gynecology;  Laterality: N/A;  . WISDOM TOOTH EXTRACTION      Family History  Problem Relation Age of Onset  . Cancer Mother        LUNG   . Breast cancer Mother        Died  . Esophageal cancer Mother   . Colon cancer Neg Hx   . Colon polyps Neg Hx   . Rectal cancer Neg Hx   . Stomach cancer Neg Hx     Allergies    Allergen Reactions  . Percocet [Oxycodone-Acetaminophen] Itching    Current Outpatient Medications on File Prior to Visit  Medication Sig Dispense Refill  . Albuterol Sulfate (PROAIR HFA IN) Inhale into the lungs. 1-2 puffs as needed every 4-6 hours for cough and wheezing    . aspirin EC 81 MG tablet Take 1 tablet (81 mg total) by mouth daily. 90 tablet 3  . atorvastatin (LIPITOR) 20 MG tablet TAKE 1 TABLET BY MOUTH EVERY DAY 90  tablet 0  . Azelastine-Fluticasone (DYMISTA) 137-50 MCG/ACT SUSP Place 1 puff into both nostrils daily.    Marland Kitchen docusate sodium (COLACE) 50 MG capsule Take by mouth as needed for mild constipation or moderate constipation. Over the counter Equate stool softener    . EPIPEN 2-PAK 0.3 MG/0.3ML SOAJ injection     . fluticasone (FLONASE) 50 MCG/ACT nasal spray Place 2 sprays into both nostrils daily. 16 g 6  . Fluticasone-Salmeterol (ADVAIR) 250-50 MCG/DOSE AEPB Inhale 1 puff into the lungs as needed.    Marland Kitchen KLOR-CON M20 20 MEQ tablet TAKE 1 TABLET BY MOUTH EVERY DAY 90 tablet 3  . lansoprazole (PREVACID) 30 MG capsule TAKE 1 CAPSULE BY MOUTH ONCE DAILY 90 capsule 0  . levocetirizine (XYZAL) 5 MG tablet Take 5 mg by mouth every evening.     Marland Kitchen losartan-hydrochlorothiazide (HYZAAR) 100-25 MG tablet Take 1 tablet by mouth daily. 90 tablet 1  . Melatonin 5 MG TABS Take 1 tablet by mouth at bedtime.    . montelukast (SINGULAIR) 10 MG tablet Take 10 mg by mouth at bedtime.    . Tavaborole (KERYDIN) 5 % SOLN Apply thin layer daily . 10 mL 2  . tolnaftate (TINACTIN) 1 % cream Apply 1 application topically 2 (two) times daily. 30 g 3  . estradiol (ESTRACE) 1 MG tablet Take 1 tablet by mouth daily (Patient not taking: Reported on 04/02/2020) 90 tablet 4   No current facility-administered medications on file prior to visit.    BP 120/90 (BP Location: Left Arm, Patient Position: Sitting, Cuff Size: Large)   Pulse 80   Temp 99 F (37.2 C) (Oral)   Ht 5\' 6"  (1.676 m)   Wt 214 lb  (97.1 kg)   SpO2 91%   BMI 34.54 kg/m       Objective:   Physical Exam Vitals and nursing note reviewed.  Constitutional:      General: She is not in acute distress.    Appearance: Normal appearance. She is well-developed. She is not ill-appearing.  HENT:     Head: Normocephalic and atraumatic.     Right Ear: Tympanic membrane, ear canal and external ear normal. There is no impacted cerumen.     Left Ear: Tympanic membrane, ear canal and external ear normal. There is no impacted cerumen.     Nose: Nose normal. No congestion or rhinorrhea.     Mouth/Throat:     Mouth: Mucous membranes are moist.     Pharynx: Oropharynx is clear. No oropharyngeal exudate or posterior oropharyngeal erythema.  Eyes:     General:        Right eye: No discharge.        Left eye: No discharge.     Extraocular Movements: Extraocular movements intact.     Conjunctiva/sclera: Conjunctivae normal.     Pupils: Pupils are equal, round, and reactive to light.  Neck:     Thyroid: No thyromegaly.     Vascular: No carotid bruit.     Trachea: No tracheal deviation.  Cardiovascular:     Rate and Rhythm: Normal rate and regular rhythm.     Pulses: Normal pulses.     Heart sounds: Normal heart sounds. No murmur heard.  No friction rub. No gallop.   Pulmonary:     Effort: Pulmonary effort is normal. No respiratory distress.     Breath sounds: Normal breath sounds. No stridor. No wheezing, rhonchi or rales.  Chest:     Chest wall: No tenderness.  Abdominal:     General: Abdomen is flat. Bowel sounds are normal. There is no distension.     Palpations: Abdomen is soft. There is no mass.     Tenderness: There is no abdominal tenderness. There is no right CVA tenderness, left CVA tenderness, guarding or rebound.     Hernia: No hernia is present.  Musculoskeletal:        General: No swelling, tenderness, deformity or signs of injury. Normal range of motion.     Cervical back: Normal range of motion and neck  supple.     Right lower leg: No edema.     Left lower leg: No edema.  Lymphadenopathy:     Cervical: No cervical adenopathy.  Skin:    General: Skin is warm and dry.     Coloration: Skin is not jaundiced or pale.     Findings: No bruising, erythema, lesion or rash.  Neurological:     General: No focal deficit present.     Mental Status: She is alert and oriented to person, place, and time.     Cranial Nerves: No cranial nerve deficit.     Sensory: No sensory deficit.     Motor: No weakness.     Coordination: Coordination normal.     Gait: Gait normal.     Deep Tendon Reflexes: Reflexes normal.  Psychiatric:        Mood and Affect: Mood normal.        Behavior: Behavior normal.        Thought Content: Thought content normal.        Judgment: Judgment normal.       Assessment & Plan:  1. Routine general medical examination at a health care facility - Continue to exercise and eat healthy  - Follow up in one year or sooner if needed - Follow up with GYN  - CBC with Differential/Platelet; Future - Hemoglobin A1c; Future - Lipid panel; Future - TSH; Future  2. Essential hypertension, benign - Well controlled.  - CBC with Differential/Platelet; Future - Hemoglobin A1c; Future - Lipid panel; Future - TSH; Future - potassium chloride SA (KLOR-CON M20) 20 MEQ tablet; Take 1 tablet (20 mEq total) by mouth daily.  Dispense: 90 tablet; Refill: 3 - losartan-hydrochlorothiazide (HYZAAR) 100-25 MG tablet; Take 1 tablet by mouth daily.  Dispense: 90 tablet; Refill: 3  3. Hyperlipidemia, unspecified hyperlipidemia type - Consider increase in statin  - CBC with Differential/Platelet; Future - Hemoglobin A1c; Future - Lipid panel; Future - TSH; Future - atorvastatin (LIPITOR) 20 MG tablet; Take 1 tablet (20 mg total) by mouth daily.  Dispense: 90 tablet; Refill: 3  4. Seasonal allergic rhinitis due to pollen - Follow up with allergy and asthma   5. Screening mammogram for breast  cancer  - MM DIGITAL SCREENING BILATERAL; Future  Dorothyann Peng, NP

## 2020-04-30 NOTE — Progress Notes (Addendum)
Subjective:   Kristin Merritt is a 59 y.o. female who presents for Medicare Annual (Subsequent) preventive examination.   Virtual Visit via Video Note  I connected with Kristin Merritt on 05/01/20 at  2:45 PM EST by a video enabled telemedicine application and verified that I am speaking with the correct person using two identifiers.  Location: Patient: Home Provider: Office   I discussed the limitations of evaluation and management by telemedicine and the availability of in person appointments. The patient expressed understanding and agreed to proceed.      I discussed the assessment and treatment plan with the patient. The patient was provided an opportunity to ask questions and all were answered. The patient agreed with the plan and demonstrated an understanding of the instructions.     Review of Systems    N/A  Cardiac Risk Factors include: hypertension;dyslipidemia     Objective:    Today's Vitals   There is no height or weight on file to calculate BMI.  Advanced Directives 05/01/2020 07/29/2016 07/14/2016 12/01/2015 11/12/2015 03/21/2015 01/30/2015  Does Patient Have a Medical Advance Directive? No No No No No No No  Would patient like information on creating a medical advance directive? No - Patient declined - Yes (MAU/Ambulatory/Procedural Areas - Information given) No - patient declined information Yes - Educational materials given No - patient declined information No - patient declined information    Current Medications (verified) Outpatient Encounter Medications as of 05/01/2020  Medication Sig  . aspirin EC 81 MG tablet Take 1 tablet (81 mg total) by mouth daily.  Marland Kitchen atorvastatin (LIPITOR) 20 MG tablet Take 1 tablet (20 mg total) by mouth daily.  . Azelastine-Fluticasone 137-50 MCG/ACT SUSP Place 1 puff into both nostrils daily.  Marland Kitchen docusate sodium (COLACE) 50 MG capsule Take by mouth as needed for mild constipation or moderate constipation. Over the counter Equate stool  softener  . lansoprazole (PREVACID) 30 MG capsule TAKE 1 CAPSULE BY MOUTH ONCE DAILY  . levocetirizine (XYZAL) 5 MG tablet Take 5 mg by mouth every evening.   Marland Kitchen losartan-hydrochlorothiazide (HYZAAR) 100-25 MG tablet Take 1 tablet by mouth daily.  . Melatonin 5 MG TABS Take 1 tablet by mouth at bedtime.  . metFORMIN (GLUCOPHAGE) 500 MG tablet Take 1 tablet (500 mg total) by mouth daily with breakfast.  . montelukast (SINGULAIR) 10 MG tablet Take 10 mg by mouth at bedtime.  . potassium chloride SA (KLOR-CON M20) 20 MEQ tablet Take 1 tablet (20 mEq total) by mouth daily.  . Tavaborole (KERYDIN) 5 % SOLN Apply thin layer daily .  . tolnaftate (TINACTIN) 1 % cream Apply 1 application topically 2 (two) times daily.  . Albuterol Sulfate (PROAIR HFA IN) Inhale into the lungs. 1-2 puffs as needed every 4-6 hours for cough and wheezing (Patient not taking: Reported on 05/01/2020)  . EPIPEN 2-PAK 0.3 MG/0.3ML SOAJ injection  (Patient not taking: Reported on 05/01/2020)  . estradiol (ESTRACE) 1 MG tablet Take 1 tablet by mouth daily (Patient not taking: No sig reported)  . fluticasone (FLONASE) 50 MCG/ACT nasal spray Place 2 sprays into both nostrils daily. (Patient not taking: Reported on 05/01/2020)  . Fluticasone-Salmeterol (ADVAIR) 250-50 MCG/DOSE AEPB Inhale 1 puff into the lungs as needed. (Patient not taking: Reported on 05/01/2020)   No facility-administered encounter medications on file as of 05/01/2020.    Allergies (verified) Percocet [oxycodone-acetaminophen]   History: Past Medical History:  Diagnosis Date  . Allergy   . Anemia    as  child  . Anxiety   . Asthma   . Constipation   . Depression   . GERD (gastroesophageal reflux disease)   . Heart murmur   . High cholesterol   . Hypertension   . Leg cramps   . Lichen sclerosus et atrophicus   . Numbness and tingling in hands   . Postmenopausal HRT (hormone replacement therapy) - followed by Dr. Toney Rakes in gyn 07/11/2012  . Swelling  of both hands   . Uterine cancer ([place]) 1991  . UTI (urinary tract infection)   . 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 VIN III (vulvar intraepithelial neoplasia III)    Past Surgical History:  Procedure Laterality Date  . EYE SURGERY     "Lazy Muscle" x 2- one as child and another 55  . SPINE SURGERY     L5-s1  . VAGINAL HYSTERECTOMY    . VULVA /PERINEUM BIOPSY N/A 11/18/2015   Procedure: WIDE LOCAL EXCISION OF VULVA and MEDIAL LEFT THIGH LESION ;  Surgeon: Terrance Mass, MD;  Location: [facility];  Service: Gynecology;  Laterality: N/A;  . WISDOM TOOTH EXTRACTION     Family History  Problem Relation Age of Onset  . Cancer Mother        LUNG   . Breast cancer Mother        Died  . Esophageal cancer Mother   . Colon cancer Neg Hx   . Colon polyps Neg Hx   . Rectal cancer Neg Hx   . Stomach cancer Neg Hx    Social History   Socioeconomic History  . Marital status: Widowed    Spouse name: Not on file  . Number of children: Not on file  . Years of education: Not on file  . Highest education level: Not on file  Occupational History  . Not on file  Tobacco Use  . Smoking status: Former Smoker    Packs/day: 0.25    Years: 20.00    Pack years: 5.00    Types: Cigarettes    Quit date: 02/21/2017    Years since quitting: 3.1  . Smokeless tobacco: Never Used  . Tobacco comment: 4 a day   Vaping Use  . Vaping Use: Never used  Substance and Sexual Activity  . Alcohol use: Yes    Alcohol/week: 0.0 standard drinks    Comment: rare  . Drug use: No  . Sexual activity: Yes    Partners: Male    Birth control/protection: Condom, Surgical    Comment: 1st intercourse- 17, partners- 32,   Other Topics Concern  . 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 Enjoys walking   Diet: Tries to watch  what she eats. Does not eat a lot of processed foods or fast food.    Social Determinants of Health   Financial Resource Strain: Low Risk   . Difficulty of Paying Living Expenses: Not hard at all  Food Insecurity: No Food Insecurity  . Worried About Charity fundraiser in the Last Year: Never true  . Ran Out of Food in the Last Year: Never true  Transportation Needs: No Transportation Needs  . Lack of Transportation (Medical): No  . Lack of Transportation (  Non-Medical): No  Physical Activity: Insufficiently Active  . Days of Exercise per Week: 4 days  . Minutes of Exercise per Session: 30 min  Stress: No Stress Concern Present  . Feeling of Stress : Not at all  Social Connections: Moderately Isolated  . Frequency of Communication with Friends and Family: More than three times a week  . Frequency of Social Gatherings with Friends and Family: More than three times a week  . Attends Religious Services: More than 4 times per year  . Active Member of Clubs or Organizations: No  . Attends Archivist Meetings: Never  . Marital Status: Widowed    Tobacco Counseling Counseling given: Not Answered Comment: 4 a day    Clinical Intake:  Pre-visit preparation completed: Yes  Pain : No/denies pain     Nutritional Risks: None  How often do you need to have someone help you when you read instructions, pamphlets, or other written materials from your doctor or pharmacy?: 1 - Never What is the last grade level you completed in school?: High School  Diabetic?No   Interpreter Needed?: No  Information entered by :: [place] of Daily Living In your present state of health, do you have any difficulty performing the following activities: 05/01/2020  Hearing? N  Vision? N  Difficulty concentrating or making decisions? N  Walking or climbing stairs? N  Dressing or bathing? N  Doing errands, shopping? N  Preparing Food and eating ? N  Using the Toilet? N  In  the past six months, have you accidently leaked urine? N  Do you have problems with loss of bowel control? N  Managing your Medications? N  Managing your Finances? N  Housekeeping or managing your Housekeeping? N  Some recent data might be hidden    Patient Care Team: Dorothyann Peng, NP as PCP - General (Family Medicine) Terrance Mass, MD (Inactive) (Obstetrics and Gynecology) Mosetta Anis, MD as Referring Physician (Allergy)  Indicate any recent Medical Services you may have received from other than [facility] providers in the past year (date may be approximate).     Assessment:   This is a routine wellness examination for Kristin Merritt.  Hearing/Vision screen  Hearing Screening   125Hz  250Hz  500Hz  1000Hz  2000Hz  3000Hz  4000Hz  6000Hz  8000Hz   Right ear:           Left ear:           Vision Screening Comments: Patient states usually gets eyes examined once per year. Has difficulties seeing close up   Dietary issues and exercise activities discussed: Current Exercise Habits: Home exercise routine, Type of exercise: walking, Time (Minutes): 30, Frequency (Times/Week): 4, Weekly Exercise (Minutes/Week): 120, Intensity: Moderate  Goals    . Exercise 150 min/wk Moderate Activity    . Patient Stated     I would like to get rid of my back fat       Depression Screen PHQ 2/9 Scores 05/01/2020 04/02/2020 01/30/2015 12/17/2014 10/03/2014  PHQ - 2 Score 0 0 3 3 0  PHQ- 9 Score 0 1 - 7 -    Fall Risk Fall Risk  05/01/2020 04/02/2020 01/30/2015 12/17/2014 10/03/2014  Falls in the past year? 0 0 No No No  Number falls in past yr: 0 0 - - -  Injury with Fall? 0 0 - - -  Risk for fall due to : No Fall Risks - Medication side effect - -  Follow up Falls evaluation completed;Falls prevention discussed - - - -  FALL RISK PREVENTION PERTAINING TO THE HOME:  Any stairs in or around the home? Yes  If so, are there any without handrails? No  Home free of loose throw rugs in walkways, pet beds,  electrical cords, etc? Yes  Adequate lighting in your home to reduce risk of falls? Yes   ASSISTIVE DEVICES UTILIZED TO PREVENT FALLS:  Life alert? No  Use of a cane, walker or w/c? No  Grab bars in the bathroom? Yes  Shower chair or bench in shower? No  Elevated toilet seat or a handicapped toilet? No    Cognitive Function:  Cognitive screening not indicated based on direct observation.      Immunizations Immunization History  Administered Date(s) Administered  . Influenza,inj,Quad PF,6+ Mos 01/29/2013, 02/13/2014, 02/21/2015, 03/19/2016, 03/09/2017  . Influenza-Unspecified 02/21/2018, 03/07/2020  . Moderna SARS-COVID-2 Vaccination 08/20/2019, 09/12/2019  . PPD Test 06/03/2014, 08/19/2015, 10/06/2016  . Tdap 01/09/2013    TDAP status: Up to date  Flu Vaccine status: Up to date  Pneumococcal vaccine status: Up to date  Covid-19 vaccine status: Completed vaccines  Qualifies for Shingles Vaccine? Yes   Zostavax completed No   Shingrix Completed?: No.    Education has been provided regarding the importance of this vaccine. Patient has been advised to call insurance company to determine out of pocket expense if they have not yet received this vaccine. Advised may also receive vaccine at local pharmacy or Health Dept. Verbalized acceptance and understanding.  Screening Tests Health Maintenance  Topic Date Due  . Hepatitis C Screening  Never done  . HIV Screening  Never done  . MAMMOGRAM  08/10/2017  . COVID-19 Vaccine (2 - Moderna risk 4-dose series) 10/10/2019  . PAP SMEAR-Modifier  07/20/2020  . TETANUS/TDAP  01/10/2023  . COLONOSCOPY  08/14/2026  . INFLUENZA VACCINE  Completed    Health Maintenance  Health Maintenance Due  Topic Date Due  . Hepatitis C Screening  Never done  . HIV Screening  Never done  . MAMMOGRAM  08/10/2017  . COVID-19 Vaccine (2 - Moderna risk 4-dose series) 10/10/2019    Colorectal cancer screening: Type of screening: Colonoscopy.  Completed 08/13/2016. Repeat every 10 years  Mammogram status: Ordered 05/01/2020. Pt provided with contact info and advised to call to schedule appt.   Bone Density status: Completed 08/10/2017. Results reflect: Bone density results: OSTEOPENIA. Repeat every 5 years.  Lung Cancer Screening: (Low Dose CT Chest recommended if Age 44-80 years, 30 pack-year currently smoking OR have quit w/in 15years.) does not qualify.   Lung Cancer Screening Referral: N/A   Additional Screening:  Hepatitis C Screening: does qualify;   Vision Screening: Recommended annual ophthalmology exams for early detection of glaucoma and other disorders of the eye. Is the patient up to date with their annual eye exam?  Yes  Who is the provider or what is the name of the office in which the patient attends annual eye exams? Does not have a doctor currently in [place] If pt is not established with a provider, would they like to be referred to a provider to establish care? Yes .   Dental Screening: Recommended annual dental exams for proper oral hygiene  Community Resource Referral / Chronic Care Management: CRR required this visit?  No   CCM required this visit?  No      Plan:     I have personally reviewed and noted the following in the patient's chart:   . Medical and social history . Use of alcohol,  tobacco or illicit drugs  . Current medications and supplements . Functional ability and status . Nutritional status . Physical activity . Advanced directives . List of other physicians . Hospitalizations, surgeries, and ER visits in previous 12 months . Vitals . Screenings to include cognitive, depression, and falls . Referrals and appointments  In addition, I have reviewed and discussed with patient certain preventive protocols, quality metrics, and best practice recommendations. A written personalized care plan for preventive services as well as general preventive health recommendations were  provided to patient.     Kristin Neas, LPN   [id number]   Nurse Notes: None

## 2020-05-01 ENCOUNTER — Other Ambulatory Visit: Payer: Self-pay

## 2020-05-01 ENCOUNTER — Ambulatory Visit (INDEPENDENT_AMBULATORY_CARE_PROVIDER_SITE_OTHER): Payer: Medicare Other

## 2020-05-01 DIAGNOSIS — Z0000 Encounter for general adult medical examination without abnormal findings: Secondary | ICD-10-CM | POA: Diagnosis not present

## 2020-05-01 NOTE — Patient Instructions (Signed)
Kristin Merritt , Thank you for taking time to come for your Medicare Wellness Visit. I appreciate your ongoing commitment to your health goals. Please review the following plan we discussed and let me know if I can assist you in the future.   Screening recommendations/referrals: Colonoscopy: Up to date, next due 08/14/2026 Mammogram: Currently due, please contact breast imaging facility so that you may get scheduled  Bone Density: Up to date, next due 08/11/2022 Recommended yearly ophthalmology/optometry visit for glaucoma screening and checkup Recommended yearly dental visit for hygiene and checkup  Vaccinations: Influenza vaccine: Up to date, next due fall 2022 Pneumococcal vaccine: Not due until age 89 Tdap vaccine: Up to date, next due 01/10/2023 Shingles vaccine: Currently due, we recommend that you get it at your local pharmacy as it is less expensive.    Advanced directives: Advance directive discussed with you today. Even though you declined this today please call our office should you change your mind and we can give you the proper paperwork for you to fill out.   Conditions/risks identified: None   Next appointment: None    Preventive Care 65 Years and Older, Female Preventive care refers to lifestyle choices and visits with your health care provider that can promote health and wellness. What does preventive care include?  A yearly physical exam. This is also called an annual well check.  Dental exams once or twice a year.  Routine eye exams. Ask your health care provider how often you should have your eyes checked.  Personal lifestyle choices, including:  Daily care of your teeth and gums.  Regular physical activity.  Eating a healthy diet.  Avoiding tobacco and drug use.  Limiting alcohol use.  Practicing safe sex.  Taking low-dose aspirin every day.  Taking vitamin and mineral supplements as recommended by your health care provider. What happens during an  annual well check? The services and screenings done by your health care provider during your annual well check will depend on your age, overall health, lifestyle risk factors, and family history of disease. Counseling  Your health care provider may ask you questions about your:  Alcohol use.  Tobacco use.  Drug use.  Emotional well-being.  Home and relationship well-being.  Sexual activity.  Eating habits.  History of falls.  Memory and ability to understand (cognition).  Work and work Statistician.  Reproductive health. Screening  You may have the following tests or measurements:  Height, weight, and BMI.  Blood pressure.  Lipid and cholesterol levels. These may be checked every 5 years, or more frequently if you are over 71 years old.  Skin check.  Lung cancer screening. You may have this screening every year starting at age 72 if you have a 30-pack-year history of smoking and currently smoke or have quit within the past 15 years.  Fecal occult blood test (FOBT) of the stool. You may have this test every year starting at age 29.  Flexible sigmoidoscopy or colonoscopy. You may have a sigmoidoscopy every 5 years or a colonoscopy every 10 years starting at age 76.  Hepatitis C blood test.  Hepatitis B blood test.  Sexually transmitted disease (STD) testing.  Diabetes screening. This is done by checking your blood sugar (glucose) after you have not eaten for a while (fasting). You may have this done every 1-3 years.  Bone density scan. This is done to screen for osteoporosis. You may have this done starting at age 82.  Mammogram. This may be done every 1-2  years. Talk to your health care provider about how often you should have regular mammograms. Talk with your health care provider about your test results, treatment options, and if necessary, the need for more tests. Vaccines  Your health care provider may recommend certain vaccines, such as:  Influenza  vaccine. This is recommended every year.  Tetanus, diphtheria, and acellular pertussis (Tdap, Td) vaccine. You may need a Td booster every 10 years.  Zoster vaccine. You may need this after age 77.  Pneumococcal 13-valent conjugate (PCV13) vaccine. One dose is recommended after age 10.  Pneumococcal polysaccharide (PPSV23) vaccine. One dose is recommended after age 39. Talk to your health care provider about which screenings and vaccines you need and how often you need them. This information is not intended to replace advice given to you by your health care provider. Make sure you discuss any questions you have with your health care provider. Document Released: 06/06/2015 Document Revised: 01/28/2016 Document Reviewed: 03/11/2015 Elsevier Interactive Patient Education  2017 [place] Prevention in the Home Falls can cause injuries. They can happen to people of all ages. There are many things you can do to make your home safe and to help prevent falls. What can I do on the outside of my home?  Regularly fix the edges of walkways and driveways and fix any cracks.  Remove anything that might make you trip as you walk through a door, such as a raised step or threshold.  Trim any bushes or trees on the path to your home.  Use bright outdoor lighting.  Clear any walking paths of anything that might make someone trip, such as rocks or tools.  Regularly check to see if handrails are loose or broken. Make sure that both sides of any steps have handrails.  Any raised decks and porches should have guardrails on the edges.  Have any leaves, snow, or ice cleared regularly.  Use sand or salt on walking paths during winter.  Clean up any spills in your garage right away. This includes oil or grease spills. What can I do in the bathroom?  Use night lights.  Install grab bars by the toilet and in the tub and shower. Do not use towel bars as grab bars.  Use non-skid mats or decals  in the tub or shower.  If you need to sit down in the shower, use a plastic, non-slip stool.  Keep the floor dry. Clean up any water that spills on the floor as soon as it happens.  Remove soap buildup in the tub or shower regularly.  Attach bath mats securely with double-sided non-slip rug tape.  Do not have throw rugs and other things on the floor that can make you trip. What can I do in the bedroom?  Use night lights.  Make sure that you have a light by your bed that is easy to reach.  Do not use any sheets or blankets that are too big for your bed. They should not hang down onto the floor.  Have a firm chair that has side arms. You can use this for support while you get dressed.  Do not have throw rugs and other things on the floor that can make you trip. What can I do in the kitchen?  Clean up any spills right away.  Avoid walking on wet floors.  Keep items that you use a lot in easy-to-reach places.  If you need to reach something above you, use a strong step  stool that has a grab bar.  Keep electrical cords out of the way.  Do not use floor polish or wax that makes floors slippery. If you must use wax, use non-skid floor wax.  Do not have throw rugs and other things on the floor that can make you trip. What can I do with my stairs?  Do not leave any items on the stairs.  Make sure that there are handrails on both sides of the stairs and use them. Fix handrails that are broken or loose. Make sure that handrails are as long as the stairways.  Check any carpeting to make sure that it is firmly attached to the stairs. Fix any carpet that is loose or worn.  Avoid having throw rugs at the top or bottom of the stairs. If you do have throw rugs, attach them to the floor with carpet tape.  Make sure that you have a light switch at the top of the stairs and the bottom of the stairs. If you do not have them, ask someone to add them for you. What else can I do to help  prevent falls?  Wear shoes that:  Do not have high heels.  Have rubber bottoms.  Are comfortable and fit you well.  Are closed at the toe. Do not wear sandals.  If you use a stepladder:  Make sure that it is fully opened. Do not climb a closed stepladder.  Make sure that both sides of the stepladder are locked into place.  Ask someone to hold it for you, if possible.  Clearly mark and make sure that you can see:  Any grab bars or handrails.  First and last steps.  Where the edge of each step is.  Use tools that help you move around (mobility aids) if they are needed. These include:  Canes.  Walkers.  Scooters.  Crutches.  Turn on the lights when you go into a dark area. Replace any light bulbs as soon as they burn out.  Set up your furniture so you have a clear path. Avoid moving your furniture around.  If any of your floors are uneven, fix them.  If there are any pets around you, be aware of where they are.  Review your medicines with your doctor. Some medicines can make you feel dizzy. This can increase your chance of falling. Ask your doctor what other things that you can do to help prevent falls. This information is not intended to replace advice given to you by your health care provider. Make sure you discuss any questions you have with your health care provider. Document Released: 03/06/2009 Document Revised: 10/16/2015 Document Reviewed: 06/14/2014 Elsevier Interactive Patient Education  2017 [employer].

## 2020-06-18 ENCOUNTER — Other Ambulatory Visit: Payer: Self-pay

## 2020-06-19 ENCOUNTER — Ambulatory Visit: Payer: Medicare Other | Admitting: Adult Health

## 2020-06-19 ENCOUNTER — Encounter: Payer: Self-pay | Admitting: Adult Health

## 2020-06-19 VITALS — BP 132/80 | Temp 97.8°F | Wt 200.0 lb

## 2020-06-19 DIAGNOSIS — E7439 Other disorders of intestinal carbohydrate absorption: Secondary | ICD-10-CM | POA: Diagnosis not present

## 2020-06-19 DIAGNOSIS — R634 Abnormal weight loss: Secondary | ICD-10-CM | POA: Diagnosis not present

## 2020-06-19 LAB — POCT GLYCOSYLATED HEMOGLOBIN (HGB A1C): HbA1c, POC (controlled diabetic range): 5.7 % (ref 0.0–7.0)

## 2020-06-19 NOTE — Progress Notes (Signed)
Subjective:    Patient ID: Kristin Merritt, female    DOB: 24-Aug-1960, 60 y.o.   MRN: [id number]  HPI 60 year old female who  has a past medical history of Allergy, Anemia, Anxiety, Asthma, Constipation, Depression, GERD (gastroesophageal reflux disease), Heart murmur, High cholesterol, Hypertension, Leg cramps, Lichen sclerosus et atrophicus, Numbness and tingling in hands, Postmenopausal HRT (hormone replacement therapy) - followed by Dr. Toney Rakes in gyn (07/11/2012), Swelling of both hands, Uterine cancer ([place]) (1991), UTI (urinary tract infection), VIN III (vulvar intraepithelial neoplasia III), and VIN III (vulvar intraepithelial neoplasia III).  She presents to the office today for follow up guarding glucose intolerance.  During her physical in November 2021 her A1c was 6.4.  She was started on Metformin 500 mg daily.  She reports that since starting this medication she has had weight loss and loss of appetite.  She denies nausea, vomiting, or diarrhea.  Wants to make sure that Shanda Bumps is the cause of this and there is no other issues going on.  She has been staying active but not exercising on a routine basis.  She does try and eat healthier has been eating less sugars  Wt Readings from Last 3 Encounters:  06/19/20 200 lb (90.7 kg)  04/02/20 214 lb (97.1 kg)  02/12/20 215 lb (97.5 kg)    Review of Systems See HPI   Past Medical History:  Diagnosis Date  . Allergy   . Anemia    as child  . Anxiety   . Asthma   . Constipation   . Depression   . GERD (gastroesophageal reflux disease)   . Heart murmur   . High cholesterol   . Hypertension   . Leg cramps   . Lichen sclerosus et atrophicus   . Numbness and tingling in hands   . Postmenopausal HRT (hormone replacement therapy) - followed by Dr. Toney Rakes in gyn 07/11/2012  . Swelling of both hands   . Uterine cancer ([place]) 1991  . UTI (urinary tract infection)   . VIN III (vulvar intraepithelial neoplasia III)    left labia majora   . VIN III (vulvar intraepithelial neoplasia III)     Social History   Socioeconomic History  . Marital status: Widowed    Spouse name: Not on file  . Number of children: Not on file  . Years of education: Not on file  . Highest education level: Not on file  Occupational History  . Not on file  Tobacco Use  . Smoking status: Former Smoker    Packs/day: 0.25    Years: 20.00    Pack years: 5.00    Types: Cigarettes    Quit date: 02/21/2017    Years since quitting: 3.3  . Smokeless tobacco: Never Used  . Tobacco comment: 4 a day   Vaping Use  . Vaping Use: Never used  Substance and Sexual Activity  . Alcohol use: Yes    Alcohol/week: 0.0 standard drinks    Comment: rare  . Drug use: No  . Sexual activity: Yes    Partners: Male    Birth control/protection: Condom, Surgical    Comment: 1st intercourse- 17, partners- 62,   Other Topics Concern  . 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 Enjoys  walking   Diet: Tries to watch what she eats. Does not eat a lot of processed foods or fast food.    Social Determinants of Health   Financial Resource Strain: Low Risk   . Difficulty of Paying Living Expenses: Not hard at all  Food Insecurity: No Food Insecurity  . Worried About Charity fundraiser in the Last Year: Never true  . Ran Out of Food in the Last Year: Never true  Transportation Needs: No Transportation Needs  . Lack of Transportation (Medical): No  . Lack of Transportation (Non-Medical): No  Physical Activity: Insufficiently Active  . Days of Exercise per Week: 4 days  . Minutes of Exercise per Session: 30 min  Stress: No Stress Concern Present  . Feeling of Stress : Not at all  Social Connections: Moderately Isolated  . Frequency of Communication with Friends and Family: More than three times a week  .  Frequency of Social Gatherings with Friends and Family: More than three times a week  . Attends Religious Services: More than 4 times per year  . Active Member of Clubs or Organizations: No  . Attends Archivist Meetings: Never  . Marital Status: Widowed  Intimate Partner Violence: Not At Risk  . Fear of Current or Ex-Partner: No  . Emotionally Abused: No  . Physically Abused: No  . Sexually Abused: No    Past Surgical History:  Procedure Laterality Date  . EYE SURGERY     "Lazy Muscle" x 2- one as child and another 74  . SPINE SURGERY     L5-s1  . VAGINAL HYSTERECTOMY    . VULVA /PERINEUM BIOPSY N/A 11/18/2015   Procedure: WIDE LOCAL EXCISION OF VULVA and MEDIAL LEFT THIGH LESION ;  Surgeon: Terrance Mass, MD;  Location: [facility];  Service: Gynecology;  Laterality: N/A;  . WISDOM TOOTH EXTRACTION      Family History  Problem Relation Age of Onset  . Cancer Mother        LUNG   . Breast cancer Mother        Died  . Esophageal cancer Mother   . Colon cancer Neg Hx   . Colon polyps Neg Hx   . Rectal cancer Neg Hx   . Stomach cancer Neg Hx     Allergies  Allergen Reactions  . Percocet [Oxycodone-Acetaminophen] Itching    Current Outpatient Medications on File Prior to Visit  Medication Sig Dispense Refill  . Albuterol Sulfate (PROAIR HFA IN) Inhale into the lungs. 1-2 puffs as needed every 4-6 hours for cough and wheezing    . aspirin EC 81 MG tablet Take 1 tablet (81 mg total) by mouth daily. 90 tablet 3  . atorvastatin (LIPITOR) 20 MG tablet Take 1 tablet (20 mg total) by mouth daily. 90 tablet 3  . Azelastine-Fluticasone 137-50 MCG/ACT SUSP Place 1 puff into both nostrils daily.    Marland Kitchen docusate sodium (COLACE) 50 MG capsule Take by mouth as needed for mild constipation or moderate constipation. Over the counter Equate stool softener    . EPIPEN 2-PAK 0.3 MG/0.3ML SOAJ injection     . fluticasone (FLONASE) 50 MCG/ACT nasal spray Place 2 sprays into both  nostrils daily. 16 g 6  . Fluticasone-Salmeterol (ADVAIR) 250-50 MCG/DOSE AEPB Inhale 1 puff into the lungs as needed.    . lansoprazole (PREVACID) 30 MG capsule TAKE 1 CAPSULE BY MOUTH ONCE DAILY 90 capsule 0  . levocetirizine (XYZAL) 5 MG tablet Take 5  mg by mouth every evening.     Marland Kitchen losartan-hydrochlorothiazide (HYZAAR) 100-25 MG tablet Take 1 tablet by mouth daily. 90 tablet 3  . Melatonin 5 MG TABS Take 1 tablet by mouth at bedtime.    . metFORMIN (GLUCOPHAGE) 500 MG tablet Take 1 tablet (500 mg total) by mouth daily with breakfast. 90 tablet 1  . montelukast (SINGULAIR) 10 MG tablet Take 10 mg by mouth at bedtime.    . potassium chloride SA (KLOR-CON M20) 20 MEQ tablet Take 1 tablet (20 mEq total) by mouth daily. 90 tablet 3  . Tavaborole (KERYDIN) 5 % SOLN Apply thin layer daily . 10 mL 2  . tolnaftate (TINACTIN) 1 % cream Apply 1 application topically 2 (two) times daily. 30 g 3  . estradiol (ESTRACE) 1 MG tablet Take 1 tablet by mouth daily (Patient not taking: No sig reported) 90 tablet 4   No current facility-administered medications on file prior to visit.    BP 132/80   Temp 97.8 F (36.6 C)   Wt 200 lb (90.7 kg)   BMI 32.28 kg/m       Objective:   Physical Exam Vitals and nursing note reviewed.  Constitutional:      Appearance: Normal appearance.  Abdominal:     General: Abdomen is flat.     Palpations: Abdomen is soft.  Musculoskeletal:        General: Normal range of motion.  Skin:    General: Skin is warm and dry.     Capillary Refill: Capillary refill takes less than 2 seconds.  Neurological:     General: No focal deficit present.     Mental Status: She is alert and oriented to person, place, and time.  Psychiatric:        Mood and Affect: Mood normal.        Behavior: Behavior normal.        Thought Content: Thought content normal.        Judgment: Judgment normal.       Assessment & Plan:  1. Glucose intolerance  - POCT A1C-A1c dropped to 5.7.   She was advised that Metformin can cause decreased appetite and people can have weight loss with this medication.  I do believe this is the cause and nothing else is causing her weight loss.  We had a discussion about whether she would like to stay on Metformin for the time being and she will continue this medication as it is helping her lose weight.  2. Weight loss -Due to Metformin.  She has advised me that she is can continue this medication  Dorothyann Peng, NP

## 2020-08-06 ENCOUNTER — Other Ambulatory Visit: Payer: Self-pay

## 2020-08-07 ENCOUNTER — Ambulatory Visit: Payer: Medicare Other | Admitting: Adult Health

## 2020-08-07 ENCOUNTER — Encounter: Payer: Self-pay | Admitting: Adult Health

## 2020-08-07 VITALS — BP 130/80 | HR 61 | Temp 98.4°F | Ht 66.0 in | Wt 199.4 lb

## 2020-08-07 DIAGNOSIS — R8290 Unspecified abnormal findings in urine: Secondary | ICD-10-CM | POA: Diagnosis not present

## 2020-08-07 LAB — POC URINALSYSI DIPSTICK (AUTOMATED)
Bilirubin, UA: NEGATIVE
Blood, UA: NEGATIVE
Glucose, UA: NEGATIVE
Ketones, UA: NEGATIVE
Leukocytes, UA: NEGATIVE
Nitrite, UA: NEGATIVE
Protein, UA: NEGATIVE
Spec Grav, UA: 1.015 (ref 1.010–1.025)
Urobilinogen, UA: 0.2 E.U./dL
pH, UA: 7.5 (ref 5.0–8.0)

## 2020-08-07 NOTE — Progress Notes (Signed)
Subjective:    Patient ID: Kristin Merritt, female    DOB: 14-Jul-1960, 60 y.o.   MRN: [id number]  HPI   60 year old female who  has a past medical history of Allergy, Anemia, Anxiety, Asthma, Constipation, Depression, GERD (gastroesophageal reflux disease), Heart murmur, High cholesterol, Hypertension, Leg cramps, Lichen sclerosus et atrophicus, Numbness and tingling in hands, Postmenopausal HRT (hormone replacement therapy) - followed by Dr. Toney Rakes in gyn (07/11/2012), Swelling of both hands, Uterine cancer ([place]) (1991), UTI (urinary tract infection), VIN III (vulvar intraepithelial neoplasia III), and VIN III (vulvar intraepithelial neoplasia III).  She presents to the office today for concern of a urinary tract infection.  Reports intermittent episodes of odorous urine over an unknown amount of time.  Denies dysuria, hematuria, urgency, or frequency.   Review of Systems See HPI   Past Medical History:  Diagnosis Date  . Allergy   . Anemia    as child  . Anxiety   . Asthma   . Constipation   . Depression   . GERD (gastroesophageal reflux disease)   . Heart murmur   . High cholesterol   . Hypertension   . Leg cramps   . Lichen sclerosus et atrophicus   . Numbness and tingling in hands   . Postmenopausal HRT (hormone replacement therapy) - followed by Dr. Toney Rakes in gyn 07/11/2012  . Swelling of both hands   . Uterine cancer ([place]) 1991  . UTI (urinary tract infection)   . 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 VIN III (vulvar intraepithelial neoplasia III)     Social History   Socioeconomic History  . Marital status: Widowed    Spouse name: Not on file  . Number of children: Not on file  . Years of education: Not on file  . Highest education level: Not on file  Occupational History  . Not on file  Tobacco Use  . Smoking status: Former Smoker    Packs/day: 0.25    Years: 20.00    Pack years: 5.00    Types: Cigarettes    Quit  date: 02/21/2017    Years since quitting: 3.4  . Smokeless tobacco: Never Used  . Tobacco comment: 4 a day   Vaping Use  . Vaping Use: Never used  Substance and Sexual Activity  . Alcohol use: Yes    Alcohol/week: 0.0 standard drinks    Comment: rare  . Drug use: No  . Sexual activity: Yes    Partners: Male    Birth control/protection: Condom, Surgical    Comment: 1st intercourse- 17, partners- 79,   Other Topics Concern  . 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 Enjoys walking   Diet: Tries to watch what she eats. Does not eat a lot of processed foods or fast food.    Social Determinants of Health   Financial Resource Strain: Low Risk   . Difficulty of Paying Living Expenses: Not hard at all  Food Insecurity: No Food Insecurity  . Worried About Charity fundraiser in the Last Year: Never true  . Ran Out of Food in the Last Year: Never true  Transportation Needs: No Transportation Needs  . Lack of Transportation (Medical): No  .  Lack of Transportation (Non-Medical): No  Physical Activity: Insufficiently Active  . Days of Exercise per Week: 4 days  . Minutes of Exercise per Session: 30 min  Stress: No Stress Concern Present  . Feeling of Stress : Not at all  Social Connections: Moderately Isolated  . Frequency of Communication with Friends and Family: More than three times a week  . Frequency of Social Gatherings with Friends and Family: More than three times a week  . Attends Religious Services: More than 4 times per year  . Active Member of Clubs or Organizations: No  . Attends Archivist Meetings: Never  . Marital Status: Widowed  Intimate Partner Violence: Not At Risk  . Fear of Current or Ex-Partner: No  . Emotionally Abused: No  . Physically Abused: No  . Sexually Abused: No    Past Surgical History:   Procedure Laterality Date  . EYE SURGERY     "Lazy Muscle" x 2- one as child and another 78  . SPINE SURGERY     L5-s1  . VAGINAL HYSTERECTOMY    . VULVA /PERINEUM BIOPSY N/A 11/18/2015   Procedure: WIDE LOCAL EXCISION OF VULVA and MEDIAL LEFT THIGH LESION ;  Surgeon: Terrance Mass, MD;  Location: [facility];  Service: Gynecology;  Laterality: N/A;  . WISDOM TOOTH EXTRACTION      Family History  Problem Relation Age of Onset  . Cancer Mother        LUNG   . Breast cancer Mother        Died  . Esophageal cancer Mother   . Colon cancer Neg Hx   . Colon polyps Neg Hx   . Rectal cancer Neg Hx   . Stomach cancer Neg Hx     Allergies  Allergen Reactions  . Percocet [Oxycodone-Acetaminophen] Itching    Current Outpatient Medications on File Prior to Visit  Medication Sig Dispense Refill  . Albuterol Sulfate (PROAIR HFA IN) Inhale into the lungs. 1-2 puffs as needed every 4-6 hours for cough and wheezing    . aspirin EC 81 MG tablet Take 1 tablet (81 mg total) by mouth daily. 90 tablet 3  . atorvastatin (LIPITOR) 20 MG tablet Take 1 tablet (20 mg total) by mouth daily. 90 tablet 3  . Azelastine-Fluticasone 137-50 MCG/ACT SUSP Place 1 puff into both nostrils daily.    Marland Kitchen EPIPEN 2-PAK 0.3 MG/0.3ML SOAJ injection     . estradiol (ESTRACE) 1 MG tablet Take 1 tablet by mouth daily 90 tablet 4  . FIBER ADULT GUMMIES PO Take by mouth.    . fluticasone (FLONASE) 50 MCG/ACT nasal spray Place 2 sprays into both nostrils daily. 16 g 6  . Fluticasone-Salmeterol (ADVAIR) 250-50 MCG/DOSE AEPB Inhale 1 puff into the lungs as needed.    . lansoprazole (PREVACID) 30 MG capsule TAKE 1 CAPSULE BY MOUTH ONCE DAILY 90 capsule 0  . levocetirizine (XYZAL) 5 MG tablet Take 5 mg by mouth every evening.     Marland Kitchen losartan-hydrochlorothiazide (HYZAAR) 100-25 MG tablet Take 1 tablet by mouth daily. 90 tablet 3  . Melatonin 5 MG TABS Take 1 tablet by mouth at bedtime.    . metFORMIN (GLUCOPHAGE) 500 MG tablet  Take 1 tablet (500 mg total) by mouth daily with breakfast. 90 tablet 1  . montelukast (SINGULAIR) 10 MG tablet Take 10 mg by mouth at bedtime.    . potassium chloride SA (KLOR-CON M20) 20 MEQ tablet Take 1 tablet (20 mEq total) by mouth  daily. 90 tablet 3  . Tavaborole (KERYDIN) 5 % SOLN Apply thin layer daily . 10 mL 2  . tolnaftate (TINACTIN) 1 % cream Apply 1 application topically 2 (two) times daily. 30 g 3   No current facility-administered medications on file prior to visit.    BP 130/80 (BP Location: Left Arm, Patient Position: Sitting, Cuff Size: Large)   Pulse 61   Temp 98.4 F (36.9 C) (Oral)   Ht 5\' 6"  (1.676 m)   Wt 199 lb 6.4 oz (90.4 kg)   BMI 32.18 kg/m       Objective:   Physical Exam Vitals and nursing note reviewed.  Constitutional:      Appearance: Normal appearance.  Cardiovascular:     Rate and Rhythm: Normal rate and regular rhythm.     Pulses: Normal pulses.     Heart sounds: Normal heart sounds.  Abdominal:     General: Abdomen is flat. Bowel sounds are normal.     Palpations: Abdomen is soft.     Tenderness: There is no abdominal tenderness. There is no right CVA tenderness or left CVA tenderness.  Skin:    General: Skin is warm and dry.     Capillary Refill: Capillary refill takes less than 2 seconds.  Neurological:     General: No focal deficit present.     Mental Status: She is alert and oriented to person, place, and time.  Psychiatric:        Mood and Affect: Mood normal.        Behavior: Behavior normal.        Thought Content: Thought content normal.        Judgment: Judgment normal.       Assessment & Plan:  1. Abnormal urine odor -Signs of urinary tract infection.  Possibly due to a degree of dehydration.  Advised to increase fluid intake. - POCT Urinalysis Dipstick (Automated) - Follow up as needed  Dorothyann Peng, NP

## 2020-09-23 ENCOUNTER — Other Ambulatory Visit: Payer: Self-pay | Admitting: Adult Health

## 2020-11-14 ENCOUNTER — Ambulatory Visit: Payer: Medicare Other

## 2020-12-15 ENCOUNTER — Other Ambulatory Visit: Payer: Self-pay | Admitting: Adult Health

## 2020-12-16 NOTE — Telephone Encounter (Signed)
[facility] for refill? Has not been filled in 2 years

## 2021-01-09 ENCOUNTER — Encounter: Payer: Self-pay | Admitting: Obstetrics & Gynecology

## 2021-01-09 ENCOUNTER — Ambulatory Visit: Payer: Medicare Other | Admitting: Obstetrics & Gynecology

## 2021-01-09 ENCOUNTER — Other Ambulatory Visit: Payer: Self-pay

## 2021-01-09 VITALS — BP 124/82 | HR 87 | Resp 16 | Wt 207.0 lb

## 2021-01-09 DIAGNOSIS — N951 Menopausal and female climacteric states: Secondary | ICD-10-CM | POA: Diagnosis not present

## 2021-01-09 MED ORDER — ESTRADIOL 0.0375 MG/24HR TD PTWK: 0.0375 mg | MEDICATED_PATCH | TRANSDERMAL | 4 refills | Status: DC

## 2021-01-09 NOTE — Progress Notes (Signed)
    Kristin Merritt 10-20-1960 [id number]        60 y.o.  G1P1L1  Boyfriend  RP: Severe menopausal symptoms  HPI: S/P Total Hysterectomy.  Stopped HRT x about 2 years.  Severe menopausal symptoms recurred.  Patient complains of hot flashes all day and night sweats interfering with sleep.  Planning to be sexually active.   OB History  Gravida Para Term Preterm AB Living  '1 1       1  '$ SAB IAB Ectopic Multiple Live Births               # Outcome Date GA Lbr Len/2nd Weight Sex Delivery Anes PTL Lv  1 Para             Past medical history,surgical history, problem list, medications, allergies, family history and social history were all reviewed and documented in the EPIC chart.   Directed ROS with pertinent positives and negatives documented in the history of present illness/assessment and plan.  Exam:  Vitals:   01/09/21 1048  BP: 124/82  Pulse: 87  Resp: 16  Weight: 207 lb (93.9 kg)   General appearance:  Normal  Gynecologic exam: Deferred   Assessment/Plan:  60 y.o. G1P1   1. Menopausal syndrome Severe menopausal syndrome recurred after stopping HRT about 2 years ago.  Patient was on hormone replacement therapy for less than 5 years before stopping.  Status post total hysterectomy.  No personal history or family history of blood clots.  No personal history of breast cancer.  Mother had a primary lung cancer, her breast cancer was probably metastatic after age 66.  Counseling on hormone replacement therapy.  Risks versus benefits of hormone replacement therapy thoroughly discussed.  Patient feels that her severe menopausal symptoms warrant the risks of hormone replacement therapy.  Decision to start on estradiol patch 0.0375 weekly.  Usage reviewed and prescription sent to pharmacy. Will f/u Annual Gyn visit in 03/2021.  Other orders - azelastine (OPTIVAR) 0.05 % ophthalmic solution; Apply to eye. - ibuprofen (ADVIL) 800 MG tablet; Take 800 mg by mouth 3 (three) times daily  as needed. - estradiol (CLIMARA - DOSED IN MG/24 HR) 0.0375 mg/24hr patch; Place 1 patch (0.0375 mg total) onto the skin once a week.   Princess Bruins MD, 11:33 AM 01/09/2021

## 2021-01-13 ENCOUNTER — Ambulatory Visit: Payer: Medicare Other | Admitting: Adult Health

## 2021-01-14 ENCOUNTER — Non-Acute Institutional Stay: Payer: Self-pay | Admitting: Nurse Practitioner

## 2021-01-15 ENCOUNTER — Other Ambulatory Visit: Payer: Self-pay

## 2021-01-15 ENCOUNTER — Encounter: Payer: Self-pay | Admitting: Adult Health

## 2021-01-15 ENCOUNTER — Ambulatory Visit: Payer: Medicare Other | Admitting: Adult Health

## 2021-01-15 VITALS — BP 140/88 | HR 93 | Temp 98.3°F | Ht 66.0 in | Wt 206.0 lb

## 2021-01-15 DIAGNOSIS — M545 Low back pain, unspecified: Secondary | ICD-10-CM | POA: Diagnosis not present

## 2021-01-15 DIAGNOSIS — M546 Pain in thoracic spine: Secondary | ICD-10-CM | POA: Diagnosis not present

## 2021-01-15 DIAGNOSIS — M542 Cervicalgia: Secondary | ICD-10-CM

## 2021-01-15 DIAGNOSIS — Z23 Encounter for immunization: Secondary | ICD-10-CM | POA: Diagnosis not present

## 2021-01-15 NOTE — Progress Notes (Signed)
Subjective:    Patient ID: Kristin Merritt, female    DOB: May 17, 1961, 60 y.o.   MRN: [id number]  HPI 59 year old female who  has a past medical history of Allergy, Anemia, Anxiety, Asthma, Constipation, Depression, GERD (gastroesophageal reflux disease), Heart murmur, High cholesterol, Hypertension, Leg cramps, Lichen sclerosus et atrophicus, Numbness and tingling in hands, Postmenopausal HRT (hormone replacement therapy) - followed by Dr. Toney Rakes in gyn (07/11/2012), Swelling of both hands, Uterine cancer ([place]) (1991), UTI (urinary tract infection), VIN III (vulvar intraepithelial neoplasia III), and VIN III (vulvar intraepithelial neoplasia III).  She presents to the office today for chronic back pain.  She has had a long history of back pain with lumbar fusion some point in time.  Her last MRI in June 2016 showed  IMPRESSION: 1. L3-L4 RIGHT-greater-than-LEFT facet degeneration with RIGHT facet arthritis. Shallow L3-L4 disc bulge without stenosis. 2. Postoperative changes of RIGHT L5 laminotomy. No recurrent stenosis. Low signal adjacent to the descending RIGHT S1 nerve probably represents scarring in the postoperative setting. Severe L5-S1 degenerative disc disease.  She reports that it feels like her back pain is getting worse and is spreading throughout her entire spine.  She has pain in her upper back, middle back, lower back pretty chronically.  He takes Advil 800 mg 3 times daily as needed which seems to help to some degree but does not fully alleviate her back pain completely.   Review of Systems See HPI   Past Medical History:  Diagnosis Date   Allergy    Anemia    as child   Anxiety    Asthma    Constipation    Depression    GERD (gastroesophageal reflux disease)    Heart murmur    High cholesterol    Hypertension    Leg cramps    Lichen sclerosus et atrophicus    Numbness and tingling in hands    Postmenopausal HRT (hormone replacement therapy) - followed by Dr.  Toney Rakes in gyn 07/11/2012   Swelling of both hands    Uterine cancer ([place]) 1991   UTI (urinary tract infection)    VIN III (vulvar intraepithelial neoplasia III)    left labia majora   VIN III (vulvar intraepithelial neoplasia III)     Social History   Socioeconomic History   Marital status: Widowed    Spouse name: Not on file   Number of children: Not on file   Years of education: Not on file   Highest education level: Not on file  Occupational History   Not on file  Tobacco Use   Smoking status: Former    Packs/day: 0.25    Years: 20.00    Pack years: 5.00    Types: Cigarettes    Quit date: 02/21/2017    Years since quitting: 3.9   Smokeless tobacco: Never   Tobacco comments:    4 a day   Vaping Use   Vaping Use: Never used  Substance and Sexual Activity   Alcohol use: Not Currently   Drug use: No   Sexual activity: Yes    Partners: Male    Birth control/protection: Surgical    Comment: 1st intercourse- 17, partners- 26, hysterectomy  Other Topics Concern   Not on file  Social History Narrative   Work or School: retired Research scientist (medical) Situation: lives alone       Spiritual Beliefs: [place]  Exercise: Has no motivation. Enjoys walking   Diet: Tries to watch what she eats. Does not eat a lot of processed foods or fast food.    Social Determinants of Health   Financial Resource Strain: Low Risk    Difficulty of Paying Living Expenses: Not hard at all  Food Insecurity: No Food Insecurity   Worried About Charity fundraiser in the Last Year: Never true   [place] in the Last Year: Never true  Transportation Needs: No Transportation Needs   Lack of Transportation (Medical): No   Lack of Transportation (Non-Medical): No  Physical Activity: Insufficiently Active   Days of Exercise per Week: 4 days   Minutes of Exercise per Session: 30 min  Stress: No Stress Concern Present   Feeling of Stress : Not at all  Social Connections:  Moderately Isolated   Frequency of Communication with Friends and Family: More than three times a week   Frequency of Social Gatherings with Friends and Family: More than three times a week   Attends Religious Services: More than 4 times per year   Active Member of [employer] or Organizations: No   Attends Archivist Meetings: Never   Marital Status: Widowed  Human resources officer Violence: Not At Risk   Fear of Current or Ex-Partner: No   Emotionally Abused: No   Physically Abused: No   Sexually Abused: No    Past Surgical History:  Procedure Laterality Date   EYE SURGERY     "Lazy Muscle" x 2- one as child and another 32   SPINE SURGERY     L5-s1   VAGINAL HYSTERECTOMY     VULVA [place] BIOPSY N/A 11/18/2015   Procedure: WIDE LOCAL EXCISION OF VULVA and MEDIAL LEFT THIGH LESION ;  Surgeon: Terrance Mass, MD;  Location: [facility];  Service: Gynecology;  Laterality: N/A;   WISDOM TOOTH EXTRACTION      Family History  Problem Relation Age of Onset   Cancer Mother        LUNG    Breast cancer Mother        Died   Esophageal cancer Mother    Colon cancer Neg Hx    Colon polyps Neg Hx    Rectal cancer Neg Hx    Stomach cancer Neg Hx     Allergies  Allergen Reactions   Percocet [Oxycodone-Acetaminophen] Itching    Current Outpatient Medications on File Prior to Visit  Medication Sig Dispense Refill   Albuterol Sulfate (PROAIR HFA IN) Inhale into the lungs. 1-2 puffs as needed every 4-6 hours for cough and wheezing     aspirin EC 81 MG tablet Take 1 tablet (81 mg total) by mouth daily. 90 tablet 3   atorvastatin (LIPITOR) 20 MG tablet Take 1 tablet (20 mg total) by mouth daily. 90 tablet 3   azelastine (OPTIVAR) 0.05 % ophthalmic solution Apply to eye.     EPIPEN 2-PAK 0.3 MG/0.3ML SOAJ injection      estradiol (CLIMARA - DOSED IN MG/24 HR) 0.0375 mg/24hr patch Place 1 patch (0.0375 mg total) onto the skin once a week. 12 patch 4   FIBER ADULT GUMMIES PO Take by  mouth.     fluticasone (FLONASE) 50 MCG/ACT nasal spray Place 2 sprays into both nostrils daily. 16 g 6   Fluticasone-Salmeterol (ADVAIR) 250-50 MCG/DOSE AEPB Inhale 1 puff into the lungs as needed.     ibuprofen (ADVIL) 800 MG tablet Take 800 mg by mouth  3 (three) times daily as needed.     lansoprazole (PREVACID) 30 MG capsule TAKE 1 CAPSULE BY MOUTH ONCE DAILY ONCE A DAY 90 capsule 3   levocetirizine (XYZAL) 5 MG tablet Take 5 mg by mouth every evening.      losartan-hydrochlorothiazide (HYZAAR) 100-25 MG tablet Take 1 tablet by mouth daily. 90 tablet 3   Melatonin 5 MG TABS Take 1 tablet by mouth at bedtime.     metFORMIN (GLUCOPHAGE) 500 MG tablet TAKE 1 TABLET BY MOUTH EVERY DAY WITH BREAKFAST 90 tablet 1   montelukast (SINGULAIR) 10 MG tablet Take 10 mg by mouth at bedtime.     potassium chloride SA (KLOR-CON M20) 20 MEQ tablet Take 1 tablet (20 mEq total) by mouth daily. 90 tablet 3   No current facility-administered medications on file prior to visit.    BP 140/88   Pulse 93   Temp 98.3 F (36.8 C) (Oral)   Ht '5\' 6"'$  (1.676 m)   Wt 206 lb (93.4 kg)   SpO2 97%   BMI 33.25 kg/m       Objective:   Physical Exam Vitals and nursing note reviewed.  Constitutional:      Appearance: Normal appearance. She is obese.  Musculoskeletal:        General: Tenderness present.     Cervical back: No tenderness or bony tenderness. No pain with movement. Normal range of motion.     Thoracic back: Tenderness and bony tenderness present.     Lumbar back: Bony tenderness present. Decreased range of motion.  Skin:    General: Skin is warm and dry.     Capillary Refill: Capillary refill takes less than 2 seconds.  Neurological:     General: No focal deficit present.     Mental Status: She is alert and oriented to person, place, and time.  Psychiatric:        Mood and Affect: Mood normal.        Behavior: Behavior normal.        Thought Content: Thought content normal.        Judgment:  Judgment normal.       Assessment & Plan:  1. Cervical spine pain - start with xrays of low back  - consider referral to PT - Encouraged stretching and strengthening exercises - DG Cervical Spine Complete; Future  2. Thoracic spine pain  - DG Thoracic Spine W/Swimmers; Future  3. Lumbar spine pain  - DG Lumbar Spine Complete; Future  4. Need for pneumococcal vaccination  - Pneumococcal conjugate vaccine 20-valent (Prevnar 20)  Dorothyann Peng, NP   Time spent on chart review, time with patient; discussion of back pain,treatment, follow up plan, and documentation 30 minutes

## 2021-01-15 NOTE — Patient Instructions (Addendum)
Please go to the [facility] off for your xrays. [place] It is in the basement.   You are do for your physical exam after November 10th  Please let me know if you need anything in the meantime

## 2021-02-03 ENCOUNTER — Encounter: Payer: Self-pay | Admitting: Family Medicine

## 2021-02-03 ENCOUNTER — Telehealth (INDEPENDENT_AMBULATORY_CARE_PROVIDER_SITE_OTHER): Payer: Medicare Other | Admitting: Family Medicine

## 2021-02-03 VITALS — Temp 98.3°F

## 2021-02-03 DIAGNOSIS — R519 Headache, unspecified: Secondary | ICD-10-CM | POA: Diagnosis not present

## 2021-02-03 DIAGNOSIS — R0981 Nasal congestion: Secondary | ICD-10-CM

## 2021-02-03 MED ORDER — DOXYCYCLINE HYCLATE 100 MG PO TABS: 100.0000 mg | ORAL_TABLET | Freq: Two times a day (BID) | ORAL | 0 refills | Status: DC

## 2021-02-03 NOTE — Progress Notes (Signed)
Virtual Visit via Video Note  I connected with Kristin Merritt  on 02/03/21 at  6:20 PM EDT by a video enabled telemedicine application and verified that I am speaking with the correct person using two identifiers.  Location patient: home, [place] Location provider:work or home office Persons participating in the virtual visit: patient, provider  I discussed the limitations of evaluation and management by telemedicine and the availability of in person appointments. The patient expressed understanding and agreed to proceed.   HPI:  Acute telemedicine visit for Sinus issues: -Onset: over a week ago - has chronic allergy issues, but this is worse now -covid test was negative -Symptoms include: nasal congestion, sinus discomfort, thick sinus drainage, dizziness at times -Denies:fever, CP, SOB, NVD, inability to eat/drink/get out of bed -Has tried:her allergy medication -Pertinent past medical history: see below, has had sinus infections -Pertinent medication allergies:  Allergies  Allergen Reactions   Percocet [Oxycodone-Acetaminophen] Itching  -COVID-19 vaccine status: vaccinated twice and boosted  ROS: See pertinent positives and negatives per HPI.  Past Medical History:  Diagnosis Date   Allergy    Anemia    as child   Anxiety    Asthma    Constipation    Depression    GERD (gastroesophageal reflux disease)    Heart murmur    High cholesterol    Hypertension    Leg cramps    Lichen sclerosus et atrophicus    Numbness and tingling in hands    Postmenopausal HRT (hormone replacement therapy) - followed by Dr. Toney Rakes in gyn 07/11/2012   Swelling of both hands    Uterine cancer ([place]) 1991   UTI (urinary tract infection)    VIN III (vulvar intraepithelial neoplasia III)    left labia majora   VIN III (vulvar intraepithelial neoplasia III)     Past Surgical History:  Procedure Laterality Date   EYE SURGERY     "Lazy Muscle" x 2- one as child and another [place] BIOPSY N/A 11/18/2015   Procedure: WIDE LOCAL EXCISION OF VULVA and MEDIAL LEFT THIGH LESION ;  Surgeon: Terrance Mass, MD;  Location: [facility];  Service: Gynecology;  Laterality: N/A;   WISDOM TOOTH EXTRACTION       Current Outpatient Medications:    Albuterol Sulfate (PROAIR HFA IN), Inhale into the lungs. 1-2 puffs as needed every 4-6 hours for cough and wheezing, Disp: , Rfl:    aspirin EC 81 MG tablet, Take 1 tablet (81 mg total) by mouth daily., Disp: 90 tablet, Rfl: 3   atorvastatin (LIPITOR) 20 MG tablet, Take 1 tablet (20 mg total) by mouth daily., Disp: 90 tablet, Rfl: 3   azelastine (OPTIVAR) 0.05 % ophthalmic solution, Apply to eye., Disp: , Rfl:    doxycycline (VIBRA-TABS) 100 MG tablet, Take 1 tablet (100 mg total) by mouth 2 (two) times daily., Disp: 20 tablet, Rfl: 0   EPIPEN 2-PAK 0.3 MG/0.3ML SOAJ injection, , Disp: , Rfl:    estradiol (CLIMARA - DOSED IN MG/24 HR) 0.0375 mg/24hr patch, Place 1 patch (0.0375 mg total) onto the skin once a week., Disp: 12 patch, Rfl: 4   FIBER ADULT GUMMIES PO, Take by mouth., Disp: , Rfl:    fluticasone (FLONASE) 50 MCG/ACT nasal spray, Place 2 sprays into both nostrils daily., Disp: 16 g, Rfl: 6   Fluticasone-Salmeterol (ADVAIR) 250-50 MCG/DOSE AEPB, Inhale 1 puff into the  lungs as needed., Disp: , Rfl:    ibuprofen (ADVIL) 800 MG tablet, Take 800 mg by mouth 3 (three) times daily as needed., Disp: , Rfl:    lansoprazole (PREVACID) 30 MG capsule, TAKE 1 CAPSULE BY MOUTH ONCE DAILY ONCE A DAY, Disp: 90 capsule, Rfl: 3   levocetirizine (XYZAL) 5 MG tablet, Take 5 mg by mouth every evening. , Disp: , Rfl:    losartan-hydrochlorothiazide (HYZAAR) 100-25 MG tablet, Take 1 tablet by mouth daily., Disp: 90 tablet, Rfl: 3   Melatonin 5 MG TABS, Take 1 tablet by mouth at bedtime as needed., Disp: , Rfl:    metFORMIN (GLUCOPHAGE) 500 MG tablet, TAKE 1 TABLET BY MOUTH EVERY DAY WITH BREAKFAST, Disp:  90 tablet, Rfl: 1   montelukast (SINGULAIR) 10 MG tablet, Take 10 mg by mouth at bedtime., Disp: , Rfl:    potassium chloride SA (KLOR-CON M20) 20 MEQ tablet, Take 1 tablet (20 mEq total) by mouth daily., Disp: 90 tablet, Rfl: 3  EXAM:  VITALS per patient if applicable:  GENERAL: alert, oriented, appears well and in no acute distress  HEENT: atraumatic, conjunttiva clear, no obvious abnormalities on inspection of external nose and ears  NECK: normal movements of the head and neck  LUNGS: on inspection no signs of respiratory distress, breathing rate appears normal, no obvious gross SOB, gasping or wheezing  CV: no obvious cyanosis  MS: moves all visible extremities without noticeable abnormality  PSYCH/NEURO: pleasant and cooperative, no obvious depression or anxiety, speech and thought processing grossly intact  ASSESSMENT AND PLAN:  Discussed the following assessment and plan:  Nasal congestion  Facial discomfort  -we discussed possible serious and likely etiologies, options for evaluation and workup, limitations of telemedicine visit vs in person visit, treatment, treatment risks and precautions. Pt is agreeable to treatment via telemedicine at this moment. Query sinusitis vs other. She has opted for treatment with doxy and nasal saline.  Advised to seek prompt in person care if worsening, new symptoms arise, or if is not improving with treatment. Discussed options for inperson care if PCP office not available. Did let this patient know that I only do telemedicine on Tuesdays and Thursdays for [facility]. Advised to schedule follow up visit with PCP or [facility] if any further questions or concerns to avoid delays in care.   I discussed the assessment and treatment plan with the patient. The patient was provided an opportunity to ask questions and all were answered. The patient agreed with the plan and demonstrated an understanding of the instructions.     Lucretia Kern, DO

## 2021-02-03 NOTE — Patient Instructions (Signed)
-  I sent the medication(s) we discussed to your pharmacy: Meds ordered this encounter  Medications  . doxycycline (VIBRA-TABS) 100 MG tablet    Sig: Take 1 tablet (100 mg total) by mouth 2 (two) times daily.    Dispense:  20 tablet    Refill:  0     I hope you are feeling better soon!  Seek in person care promptly if your symptoms worsen, new concerns arise or you are not improving with treatment.  It was nice to meet you today. I help [facility] out with telemedicine visits on Tuesdays and Thursdays and am available for visits on those days. If you have any concerns or questions following this visit please schedule a follow up visit with your Primary Care doctor or seek care at a local urgent care clinic to avoid delays in care.  

## 2021-02-04 ENCOUNTER — Ambulatory Visit: Payer: Medicare Other

## 2021-02-20 ENCOUNTER — Encounter: Payer: Self-pay | Admitting: Adult Health

## 2021-02-20 ENCOUNTER — Telehealth: Payer: Medicare Other | Admitting: Adult Health

## 2021-02-20 ENCOUNTER — Telehealth (INDEPENDENT_AMBULATORY_CARE_PROVIDER_SITE_OTHER): Payer: Medicare Other | Admitting: Adult Health

## 2021-02-20 VITALS — Ht 66.0 in | Wt 206.0 lb

## 2021-02-20 DIAGNOSIS — J0101 Acute recurrent maxillary sinusitis: Secondary | ICD-10-CM

## 2021-02-20 MED ORDER — AMOXICILLIN-POT CLAVULANATE 875-125 MG PO TABS: 1.0000 | ORAL_TABLET | Freq: Two times a day (BID) | ORAL | 0 refills | Status: DC

## 2021-02-20 MED ORDER — PREDNISONE 10 MG PO TABS: ORAL_TABLET | ORAL | 0 refills | Status: DC

## 2021-02-20 NOTE — Progress Notes (Signed)
Virtual Visit via Video Note  I connected with Kristin Merritt on 02/20/21 at  4:00 PM EDT by a video enabled telemedicine application and verified that I am speaking with the correct person using two identifiers.  Location patient: home Location provider:work or home office Persons participating in the virtual visit: patient, provider  I discussed the limitations of evaluation and management by telemedicine and the availability of in person appointments. The patient expressed understanding and agreed to proceed.   HPI: 60 year old female who  has a past medical history of Allergy, Anemia, Anxiety, Asthma, Constipation, Depression, GERD (gastroesophageal reflux disease), Heart murmur, High cholesterol, Hypertension, Leg cramps, Lichen sclerosus et atrophicus, Numbness and tingling in hands, Postmenopausal HRT (hormone replacement therapy) - followed by Dr. Toney Rakes in gyn (07/11/2012), Swelling of both hands, Uterine cancer ([facility]) (1991), UTI (urinary tract infection), VIN III (vulvar intraepithelial neoplasia III), and VIN III (vulvar intraepithelial neoplasia III).  She is being evaluated today for an acute issue.  Her symptoms have been present for roughly 2 to 3 weeks.  Was seen by another provider 2 weeks ago for the same issues.  Was provided Doxy with no relief.  Symptoms include nasal congestion, sinus pain and pressure, thick sinus drainage, dizziness, and the feeling of her ears being wet.  She denies fever, shortness of breath, nausea, vomiting, diarrhea, or chest pain.   ROS: See pertinent positives and negatives per HPI.  Past Medical History:  Diagnosis Date   Allergy    Anemia    as child   Anxiety    Asthma    Constipation    Depression    GERD (gastroesophageal reflux disease)    Heart murmur    High cholesterol    Hypertension    Leg cramps    Lichen sclerosus et atrophicus    Numbness and tingling in hands    Postmenopausal HRT (hormone replacement therapy) -  followed by Dr. Toney Rakes in gyn 07/11/2012   Swelling of both hands    Uterine cancer ([place]) 1991   UTI (urinary tract infection)    VIN III (vulvar intraepithelial neoplasia III)    left labia majora   VIN III (vulvar intraepithelial neoplasia III)     Past Surgical History:  Procedure Laterality Date   EYE SURGERY     "Lazy Muscle" x 2- one as child and another [place] BIOPSY N/A 11/18/2015   Procedure: WIDE LOCAL EXCISION OF VULVA and MEDIAL LEFT THIGH LESION ;  Surgeon: Kristin Mass, MD;  Location: [facility];  Service: Gynecology;  Laterality: N/A;   WISDOM TOOTH EXTRACTION      Family History  Problem Relation Age of Onset   Cancer Mother        LUNG    Breast cancer Mother        Died   Esophageal cancer Mother    Colon cancer Neg Hx    Colon polyps Neg Hx    Rectal cancer Neg Hx    Stomach cancer Neg Hx        Current Outpatient Medications:    Albuterol Sulfate (PROAIR HFA IN), Inhale into the lungs. 1-2 puffs as needed every 4-6 hours for cough and wheezing, Disp: , Rfl:    aspirin EC 81 MG tablet, Take 1 tablet (81 mg total) by mouth daily., Disp: 90 tablet, Rfl: 3   atorvastatin (LIPITOR) 20  MG tablet, Take 1 tablet (20 mg total) by mouth daily., Disp: 90 tablet, Rfl: 3   azelastine (OPTIVAR) 0.05 % ophthalmic solution, Apply to eye., Disp: , Rfl:    doxycycline (VIBRA-TABS) 100 MG tablet, Take 1 tablet (100 mg total) by mouth 2 (two) times daily., Disp: 20 tablet, Rfl: 0   EPIPEN 2-PAK 0.3 MG/0.3ML SOAJ injection, , Disp: , Rfl:    estradiol (CLIMARA - DOSED IN MG/24 HR) 0.0375 mg/24hr patch, Place 1 patch (0.0375 mg total) onto the skin once a week., Disp: 12 patch, Rfl: 4   FIBER ADULT GUMMIES PO, Take by mouth., Disp: , Rfl:    fluticasone (FLONASE) 50 MCG/ACT nasal spray, Place 2 sprays into both nostrils daily., Disp: 16 g, Rfl: 6   Fluticasone-Salmeterol (ADVAIR) 250-50 MCG/DOSE AEPB,  Inhale 1 puff into the lungs as needed., Disp: , Rfl:    ibuprofen (ADVIL) 800 MG tablet, Take 800 mg by mouth 3 (three) times daily as needed., Disp: , Rfl:    lansoprazole (PREVACID) 30 MG capsule, TAKE 1 CAPSULE BY MOUTH ONCE DAILY ONCE A DAY, Disp: 90 capsule, Rfl: 3   levocetirizine (XYZAL) 5 MG tablet, Take 5 mg by mouth every evening. , Disp: , Rfl:    losartan-hydrochlorothiazide (HYZAAR) 100-25 MG tablet, Take 1 tablet by mouth daily., Disp: 90 tablet, Rfl: 3   Melatonin 5 MG TABS, Take 1 tablet by mouth at bedtime as needed., Disp: , Rfl:    metFORMIN (GLUCOPHAGE) 500 MG tablet, TAKE 1 TABLET BY MOUTH EVERY DAY WITH BREAKFAST, Disp: 90 tablet, Rfl: 1   montelukast (SINGULAIR) 10 MG tablet, Take 10 mg by mouth at bedtime., Disp: , Rfl:    potassium chloride SA (KLOR-CON M20) 20 MEQ tablet, Take 1 tablet (20 mEq total) by mouth daily., Disp: 90 tablet, Rfl: 3  EXAM:  VITALS per patient if applicable:  GENERAL: alert, oriented, appears well and in no acute distress  HEENT: atraumatic, conjunttiva clear, no obvious abnormalities on inspection of external nose and ears  NECK: normal movements of the head and neck  LUNGS: on inspection no signs of respiratory distress, breathing rate appears normal, no obvious gross SOB, gasping or wheezing  CV: no obvious cyanosis  MS: moves all visible extremities without noticeable abnormality  PSYCH/NEURO: pleasant and cooperative, no obvious depression or anxiety, speech and thought processing grossly intact  ASSESSMENT AND PLAN:  Discussed the following assessment and plan:  1. Acute recurrent maxillary sinusitis - possible abx failure. Will send in augmentin and prednisone.  - amoxicillin-clavulanate (AUGMENTIN) 875-125 MG tablet; Take 1 tablet by mouth 2 (two) times daily.  Dispense: 20 tablet; Refill: 0 - predniSONE (DELTASONE) 10 MG tablet; 40 mg x 3 days, 20 mg x 3 days, 10 mg x 3 days  Dispense: 21 tablet; Refill: 0   I discussed  the assessment and treatment plan with the patient. The patient was provided an opportunity to ask questions and all were answered. The patient agreed with the plan and demonstrated an understanding of the instructions.   The patient was advised to call back or seek an in-person evaluation if the symptoms worsen or if the condition fails to improve as anticipated.   Kristin Peng, NP

## 2021-02-23 ENCOUNTER — Telehealth: Payer: Self-pay | Admitting: Adult Health

## 2021-02-23 NOTE — Telephone Encounter (Signed)
Amoxicillin was given to pt on Friday.  She states it gives her severe yeast infections.  She is requesting a twin pack of Diflucan--she said the single dose doesn't work that she always has to do two doses.

## 2021-02-25 ENCOUNTER — Other Ambulatory Visit: Payer: Self-pay | Admitting: Adult Health

## 2021-02-25 ENCOUNTER — Other Ambulatory Visit: Payer: Self-pay

## 2021-02-25 ENCOUNTER — Ambulatory Visit
Admission: RE | Admit: 2021-02-25 | Discharge: 2021-02-25 | Disposition: A | Discharge status: Home / Self Care | Payer: Medicare Other | Source: Ambulatory Visit | Attending: Adult Health | Admitting: Adult Health

## 2021-02-25 DIAGNOSIS — Z1231 Encounter for screening mammogram for malignant neoplasm of breast: Secondary | ICD-10-CM

## 2021-02-25 MED ORDER — FLUCONAZOLE 150 MG PO TABS: ORAL_TABLET | ORAL | 0 refills | Status: DC

## 2021-02-25 NOTE — Telephone Encounter (Signed)
OK  to send in if so what sig and Rx

## 2021-02-26 NOTE — Telephone Encounter (Signed)
This has been taking care of.

## 2021-03-18 ENCOUNTER — Other Ambulatory Visit: Payer: Self-pay | Admitting: Adult Health

## 2021-03-21 ENCOUNTER — Other Ambulatory Visit: Payer: Self-pay | Admitting: Adult Health

## 2021-03-21 DIAGNOSIS — I10 Essential (primary) hypertension: Secondary | ICD-10-CM

## 2021-04-07 ENCOUNTER — Ambulatory Visit (INDEPENDENT_AMBULATORY_CARE_PROVIDER_SITE_OTHER): Payer: Medicare Other | Admitting: Adult Health

## 2021-04-07 ENCOUNTER — Encounter: Payer: Self-pay | Admitting: Adult Health

## 2021-04-07 ENCOUNTER — Other Ambulatory Visit: Payer: Self-pay | Admitting: Adult Health

## 2021-04-07 VITALS — BP 140/86 | HR 71 | Temp 98.6°F | Ht 66.0 in | Wt 213.0 lb

## 2021-04-07 DIAGNOSIS — Z0000 Encounter for general adult medical examination without abnormal findings: Secondary | ICD-10-CM

## 2021-04-07 DIAGNOSIS — Z1159 Encounter for screening for other viral diseases: Secondary | ICD-10-CM

## 2021-04-07 DIAGNOSIS — R079 Chest pain, unspecified: Secondary | ICD-10-CM | POA: Diagnosis not present

## 2021-04-07 DIAGNOSIS — E785 Hyperlipidemia, unspecified: Secondary | ICD-10-CM | POA: Diagnosis not present

## 2021-04-07 DIAGNOSIS — E7439 Other disorders of intestinal carbohydrate absorption: Secondary | ICD-10-CM | POA: Diagnosis not present

## 2021-04-07 DIAGNOSIS — J4520 Mild intermittent asthma, uncomplicated: Secondary | ICD-10-CM

## 2021-04-07 DIAGNOSIS — Z114 Encounter for screening for human immunodeficiency virus [HIV]: Secondary | ICD-10-CM

## 2021-04-07 DIAGNOSIS — I10 Essential (primary) hypertension: Secondary | ICD-10-CM | POA: Diagnosis not present

## 2021-04-07 DIAGNOSIS — J301 Allergic rhinitis due to pollen: Secondary | ICD-10-CM

## 2021-04-07 LAB — COMPREHENSIVE METABOLIC PANEL
ALT: 24 U/L (ref 0–35)
AST: 23 U/L (ref 0–37)
Albumin: 4.5 g/dL (ref 3.5–5.2)
Alkaline Phosphatase: 63 U/L (ref 39–117)
BUN: 14 mg/dL (ref 6–23)
CO2: 32 mEq/L (ref 19–32)
Calcium: 9.4 mg/dL (ref 8.4–10.5)
Chloride: 102 mEq/L (ref 96–112)
Creatinine, Ser: 0.88 mg/dL (ref 0.40–1.20)
GFR: 71.54 mL/min (ref >60.00)
Glucose, Bld: 98 mg/dL (ref 70–99)
Potassium: 3.8 mEq/L (ref 3.5–5.1)
Sodium: 141 mEq/L (ref 135–145)
Total Bilirubin: 0.6 mg/dL (ref 0.2–1.2)
Total Protein: 6.9 g/dL (ref 6.0–8.3)

## 2021-04-07 LAB — LIPID PANEL
Cholesterol: 174 mg/dL (ref 0–200)
HDL: 73.6 mg/dL (ref >39.00)
LDL Cholesterol: 86 mg/dL (ref 0–99)
NonHDL: 100.07
Total CHOL/HDL Ratio: 2
Triglycerides: 70 mg/dL (ref 0.0–149.0)
VLDL: 14 mg/dL (ref 0.0–40.0)

## 2021-04-07 LAB — CBC WITH DIFFERENTIAL/PLATELET
Basophils Absolute: 0 10*3/uL (ref 0.0–0.1)
Basophils Relative: 0.4 % (ref 0.0–3.0)
Eosinophils Absolute: 0.1 10*3/uL (ref 0.0–0.7)
Eosinophils Relative: 2.1 % (ref 0.0–5.0)
HCT: 37.9 % (ref 36.0–46.0)
Hemoglobin: 12 g/dL (ref 12.0–15.0)
Lymphocytes Relative: 42 % (ref 12.0–46.0)
Lymphs Abs: 2.3 10*3/uL (ref 0.7–4.0)
MCHC: 31.7 g/dL (ref 30.0–36.0)
MCV: 78.5 fl (ref 78.0–100.0)
Monocytes Absolute: 0.4 10*3/uL (ref 0.1–1.0)
Monocytes Relative: 8.1 % (ref 3.0–12.0)
Neutro Abs: 2.6 10*3/uL (ref 1.4–7.7)
Neutrophils Relative %: 47.4 % (ref 43.0–77.0)
Platelets: 151 10*3/uL (ref 150.0–400.0)
RBC: 4.82 Mil/uL (ref 3.87–5.11)
RDW: 15.7 % — ABNORMAL HIGH (ref 11.5–15.5)
WBC: 5.5 10*3/uL (ref 4.0–10.5)

## 2021-04-07 LAB — HEMOGLOBIN A1C: Hgb A1c MFr Bld: 6.6 % — ABNORMAL HIGH (ref 4.6–6.5)

## 2021-04-07 LAB — TSH: TSH: 0.88 u[IU]/mL (ref 0.35–5.50)

## 2021-04-07 NOTE — Progress Notes (Signed)
Subjective:    Patient ID: Kristin Merritt, female    DOB: 04/24/1961, 60 y.o.   MRN: [id number]  HPI Patient presents for yearly preventative medicine examination. She is a pleasant 60 year old female who  has a past medical history of Allergy, Anemia, Anxiety, Asthma, Constipation, Depression, GERD (gastroesophageal reflux disease), Heart murmur, High cholesterol, Hypertension, Leg cramps, Lichen sclerosus et atrophicus, Numbness and tingling in hands, Postmenopausal HRT (hormone replacement therapy) - followed by Dr. Toney Rakes in gyn (07/11/2012), Swelling of both hands, Uterine cancer ([place]) (1991), UTI (urinary tract infection), VIN III (vulvar intraepithelial neoplasia III), and VIN III (vulvar intraepithelial neoplasia III).  Hyperlipidemia-currently prescribed Lipitor 20 mg daily.  She denies myalgia or fatigue  Lab Results  Component Value Date   CHOL 174 04/07/2021   HDL 73.60 04/07/2021   LDLCALC 86 04/07/2021   TRIG 70.0 04/07/2021   CHOLHDL 2 04/07/2021   Hypertension-prescribed losartan/hydrochlorothiazide 100-25 mg.  She is also supplemented with 20 mEq of K-Dur to prevent hypokalemia  BP Readings from Last 3 Encounters:  04/07/21 140/86  01/15/21 140/88  01/09/21 124/82   Seasonal allergies/asthma-is seen by allergy and asthma clinic.  She is prescribed Singulair, Flonase, Advair 250-500 mg.  She reports doing well this combination of medication  Glucose intolerance-currently prescribed metformin 500 mg daily. Lab Results  Component Value Date   HGBA1C 6.6 (H) 04/07/2021    Chest Pain -reports chest pain, midsternal intermittently.  Happens about once a week, lasts for an hour or 2 and then resolves.  Denies pain radiating up her left jawline or down her left arm.  Been present for an unknown amount of time.  She does take Prevacid 30 mg for control of GERD symptoms.   All immunizations and health maintenance protocols were reviewed with the patient and needed orders  were placed.  Appropriate screening laboratory values were ordered for the patient including screening of hyperlipidemia, renal function and hepatic function.   Medication reconciliation,  past medical history, social history, problem list and allergies were reviewed in detail with the patient  Goals were established with regard to weight loss, exercise, and  diet in compliance with medications  End of life planning was discussed.  She is up-to-date on routine colon cancer and breast cancer screening. She will do her GYN in January   Review of Systems  Constitutional: Negative.   HENT: Negative.    Eyes: Negative.   Respiratory: Negative.    Cardiovascular:  Positive for chest pain.  Gastrointestinal: Negative.   Endocrine: Negative.   Genitourinary: Negative.   Musculoskeletal: Negative.   Skin: Negative.   Allergic/Immunologic: Negative.   Neurological: Negative.   Hematological: Negative.   Psychiatric/Behavioral: Negative.     Past Medical History:  Diagnosis Date   Allergy    Anemia    as child   Anxiety    Asthma    Constipation    Depression    GERD (gastroesophageal reflux disease)    Heart murmur    High cholesterol    Hypertension    Leg cramps    Lichen sclerosus et atrophicus    Numbness and tingling in hands    Postmenopausal HRT (hormone replacement therapy) - followed by Dr. Toney Rakes in gyn 07/11/2012   Swelling of both hands    Uterine cancer ([place]) 1991   UTI (urinary tract infection)    VIN III (vulvar intraepithelial neoplasia III)    left labia majora   VIN III (vulvar intraepithelial neoplasia III)  Social History   Socioeconomic History   Marital status: Widowed    Spouse name: Not on file   Number of children: Not on file   Years of education: Not on file   Highest education level: Not on file  Occupational History   Not on file  Tobacco Use   Smoking status: Former    Packs/day: 0.25    Years: 20.00    Pack years: 5.00     Types: Cigarettes    Quit date: 02/21/2017    Years since quitting: 4.1   Smokeless tobacco: Never   Tobacco comments:    4 a day   Vaping Use   Vaping Use: Never used  Substance and Sexual Activity   Alcohol use: Not Currently   Drug use: No   Sexual activity: Yes    Partners: Male    Birth control/protection: Surgical    Comment: 1st intercourse- 17, partners- 60, hysterectomy  Other Topics Concern   Not on file  Social History Narrative   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 Enjoys walking   Diet: Tries to watch what she eats. Does not eat a lot of processed foods or fast food.    Social Determinants of Health   Financial Resource Strain: Low Risk    Difficulty of Paying Living Expenses: Not hard at all  Food Insecurity: No Food Insecurity   Worried About Charity fundraiser in the Last Year: Never true   [place] in the Last Year: Never true  Transportation Needs: No Transportation Needs   Lack of Transportation (Medical): No   Lack of Transportation (Non-Medical): No  Physical Activity: Insufficiently Active   Days of Exercise per Week: 4 days   Minutes of Exercise per Session: 30 min  Stress: No Stress Concern Present   Feeling of Stress : Not at all  Social Connections: Moderately Isolated   Frequency of Communication with Friends and Family: More than three times a week   Frequency of Social Gatherings with Friends and Family: More than three times a week   Attends Religious Services: More than 4 times per year   Active Member of [employer] or Organizations: No   Attends Archivist Meetings: Never   Marital Status: Widowed  Intimate Partner Violence: Not At Risk   Fear of Current or Ex-Partner: No   Emotionally Abused: No   Physically Abused: No   Sexually Abused: No    Past Surgical History:  Procedure Laterality Date   BREAST  EXCISIONAL BIOPSY Left 08/24/2016   times 2   EYE SURGERY     "Lazy Muscle" x 2- one as child and another [place] BIOPSY N/A 11/18/2015   Procedure: WIDE LOCAL EXCISION OF VULVA and MEDIAL LEFT THIGH LESION ;  Surgeon: Terrance Mass, MD;  Location: [facility];  Service: Gynecology;  Laterality: N/A;   WISDOM TOOTH EXTRACTION      Family History  Problem Relation Age of Onset   Cancer Mother        LUNG    Breast cancer Mother        Died   Esophageal cancer Mother    Colon cancer Neg  Hx    Colon polyps Neg Hx    Rectal cancer Neg Hx    Stomach cancer Neg Hx     Allergies  Allergen Reactions   Percocet [Oxycodone-Acetaminophen] Itching    Current Outpatient Medications on File Prior to Visit  Medication Sig Dispense Refill   Albuterol Sulfate (PROAIR HFA IN) Inhale into the lungs. 1-2 puffs as needed every 4-6 hours for cough and wheezing     atorvastatin (LIPITOR) 20 MG tablet Take 1 tablet (20 mg total) by mouth daily. 90 tablet 3   azelastine (OPTIVAR) 0.05 % ophthalmic solution Apply to eye.     Azelastine HCl (OPTIVAR OP) 1 drop into affected eye     Azelastine-Fluticasone 137-50 MCG/ACT SUSP 1 puff in each nostril     docusate sodium (COLACE) 100 MG capsule      EPIPEN 2-PAK 0.3 MG/0.3ML SOAJ injection      estradiol (CLIMARA - DOSED IN MG/24 HR) 0.0375 mg/24hr patch Place 1 patch (0.0375 mg total) onto the skin once a week. 12 patch 4   FIBER ADULT GUMMIES PO Take by mouth.     fluticasone (FLONASE) 50 MCG/ACT nasal spray Place 2 sprays into both nostrils daily. 16 g 6   Fluticasone-Salmeterol (ADVAIR) 250-50 MCG/DOSE AEPB Inhale 1 puff into the lungs as needed.     ibuprofen (ADVIL) 800 MG tablet Take 800 mg by mouth 3 (three) times daily as needed.     lansoprazole (PREVACID) 30 MG capsule TAKE 1 CAPSULE BY MOUTH ONCE DAILY ONCE A DAY 90 capsule 3   levocetirizine (XYZAL) 5 MG tablet Take 5 mg by  mouth every evening.      losartan-hydrochlorothiazide (HYZAAR) 100-25 MG tablet TAKE 1 TABLET BY MOUTH EVERY DAY 90 tablet 3   Melatonin 5 MG TABS Take 1 tablet by mouth at bedtime as needed.     metFORMIN (GLUCOPHAGE) 500 MG tablet TAKE 1 TABLET BY MOUTH EVERY DAY WITH BREAKFAST 30 tablet 0   montelukast (SINGULAIR) 10 MG tablet Take 10 mg by mouth at bedtime.     Omeprazole Magnesium (PRILOSEC) 10 MG PACK      potassium chloride SA (KLOR-CON M20) 20 MEQ tablet Take 1 tablet (20 mEq total) by mouth daily. 90 tablet 3   No current facility-administered medications on file prior to visit.    BP 140/86   Pulse 71   Temp 98.6 F (37 C) (Oral)   Ht 5\' 6"  (1.676 m)   Wt 213 lb (96.6 kg)   SpO2 98%   BMI 34.38 kg/m        Objective:   Physical Exam Vitals and nursing note reviewed.  Constitutional:      General: She is not in acute distress.    Appearance: Normal appearance. She is well-developed. She is not ill-appearing.  HENT:     Head: Normocephalic and atraumatic.     Right Ear: Tympanic membrane, ear canal and external ear normal. There is no impacted cerumen.     Left Ear: Tympanic membrane, ear canal and external ear normal. There is no impacted cerumen.     Nose: Nose normal. No congestion or rhinorrhea.     Mouth/Throat:     Mouth: Mucous membranes are moist.     Pharynx: Oropharynx is clear. No oropharyngeal exudate or posterior oropharyngeal erythema.  Eyes:     General:        Right eye: No discharge.        Left eye: No discharge.  Extraocular Movements: Extraocular movements intact.     Conjunctiva/sclera: Conjunctivae normal.     Pupils: Pupils are equal, round, and reactive to light.  Neck:     Thyroid: No thyromegaly.     Vascular: No carotid bruit.     Trachea: No tracheal deviation.  Cardiovascular:     Rate and Rhythm: Normal rate and regular rhythm.     Pulses: Normal pulses.     Heart sounds: Normal heart sounds. No murmur heard.   No friction  rub. No gallop.  Pulmonary:     Effort: Pulmonary effort is normal. No respiratory distress.     Breath sounds: Normal breath sounds. No stridor. No wheezing, rhonchi or rales.  Chest:     Chest wall: No tenderness.  Abdominal:     General: Abdomen is flat. Bowel sounds are normal. There is no distension.     Palpations: Abdomen is soft. There is no mass.     Tenderness: There is no abdominal tenderness. There is no right CVA tenderness, left CVA tenderness, guarding or rebound.     Hernia: No hernia is present.  Musculoskeletal:        General: No swelling, tenderness, deformity or signs of injury. Normal range of motion.     Cervical back: Normal range of motion and neck supple.     Right lower leg: No edema.     Left lower leg: No edema.  Lymphadenopathy:     Cervical: No cervical adenopathy.  Skin:    General: Skin is warm and dry.     Coloration: Skin is not jaundiced or pale.     Findings: No bruising, erythema, lesion or rash.  Neurological:     General: No focal deficit present.     Mental Status: She is alert and oriented to person, place, and time.     Cranial Nerves: No cranial nerve deficit.     Sensory: No sensory deficit.     Motor: No weakness.     Coordination: Coordination normal.     Gait: Gait normal.     Deep Tendon Reflexes: Reflexes normal.  Psychiatric:        Mood and Affect: Mood normal.        Behavior: Behavior normal.        Thought Content: Thought content normal.        Judgment: Judgment normal.      Assessment & Plan:   1. Routine general medical examination at a health care facility - Follow up in one year or sooner if needed - CBC with Differential/Platelet; Future - Comprehensive metabolic panel; Future - Hemoglobin A1c; Future - Lipid panel; Future - TSH; Future  2. Glucose intolerance - Consider increase in metformin  - CBC with Differential/Platelet; Future - Comprehensive metabolic panel; Future - Hemoglobin A1c; Future -  Lipid panel; Future - TSH; Future  3. Essential hypertension, benign -  BP controlled.  - CBC with Differential/Platelet; Future - Comprehensive metabolic panel; Future - Hemoglobin A1c; Future - Lipid panel; Future - TSH; Future  4. Mild intermittent chronic asthma without complication - Follow up with Allergy and asthma as directed  5. Seasonal allergic rhinitis due to pollen - Follow up with Allergy and asthma s directed  6. Hyperlipidemia, unspecified hyperlipidemia type  - CBC with Differential/Platelet; Future - Comprehensive metabolic panel; Future - Hemoglobin A1c; Future - Lipid panel; Future - TSH; Future  7. Need for hepatitis C screening test  - Hep C Antibody; Future  8. Encounter for screening for HIV  - HIV antibody (with reflex); Future  9. Chest pain, unspecified type  - EKG 12-Lead- Sinus Bradycardia, rate 59.    Dorothyann Peng, NP

## 2021-04-08 ENCOUNTER — Other Ambulatory Visit: Payer: Self-pay | Admitting: Adult Health

## 2021-04-08 ENCOUNTER — Telehealth: Payer: Self-pay

## 2021-04-08 DIAGNOSIS — E785 Hyperlipidemia, unspecified: Secondary | ICD-10-CM

## 2021-04-08 DIAGNOSIS — I10 Essential (primary) hypertension: Secondary | ICD-10-CM

## 2021-04-08 LAB — HEPATITIS C ANTIBODY
Hepatitis C Ab: NONREACTIVE
SIGNAL TO CUT-OFF: 0.04 (ref <1.00)

## 2021-04-08 LAB — HIV ANTIBODY (ROUTINE TESTING W REFLEX): HIV 1&2 Ab, 4th Generation: NONREACTIVE

## 2021-04-08 MED ORDER — POTASSIUM CHLORIDE CRYS ER 20 MEQ PO TBCR: 20.0000 meq | EXTENDED_RELEASE_TABLET | Freq: Every day | ORAL | 3 refills | Status: DC

## 2021-04-08 MED ORDER — ATORVASTATIN CALCIUM 20 MG PO TABS: 20.0000 mg | ORAL_TABLET | Freq: Every day | ORAL | 3 refills | Status: DC

## 2021-04-08 MED ORDER — IBUPROFEN 800 MG PO TABS: 800.0000 mg | ORAL_TABLET | Freq: Three times a day (TID) | ORAL | 2 refills | Status: DC | PRN

## 2021-04-08 NOTE — Telephone Encounter (Signed)
Patient returned call and stated best to call back after 2pm

## 2021-04-09 ENCOUNTER — Telehealth: Payer: Self-pay | Admitting: Adult Health

## 2021-04-09 MED ORDER — METFORMIN HCL 500 MG PO TABS: 500.0000 mg | ORAL_TABLET | Freq: Two times a day (BID) | ORAL | 0 refills | Status: DC

## 2021-04-09 NOTE — Telephone Encounter (Signed)
Updated patient on labs, her A1c has hit 6.6. Will increase her metformin from 500 mg daily to BID dosing. Needs to work on weight loss measures.   Follow up in 3 months

## 2021-04-09 NOTE — Telephone Encounter (Signed)
Called pt back I have no recollection of calling pt. Pt stated that Tommi Rumps was the person who called. I verbalized understanding and advised pt that I will route to [facility] so that he can speak with her. Pt stated she is free anytime after 3pm.

## 2021-04-10 NOTE — Telephone Encounter (Signed)
Noted  

## 2021-04-11 ENCOUNTER — Other Ambulatory Visit: Payer: Self-pay | Admitting: Adult Health

## 2021-04-21 ENCOUNTER — Ambulatory Visit: Payer: Medicare Other | Admitting: Obstetrics & Gynecology

## 2021-05-07 ENCOUNTER — Ambulatory Visit: Payer: Medicare Other | Admitting: Adult Health

## 2021-05-07 ENCOUNTER — Encounter: Payer: Self-pay | Admitting: Adult Health

## 2021-05-07 VITALS — BP 122/70 | HR 79 | Temp 98.7°F | Ht 66.0 in | Wt 210.0 lb

## 2021-05-07 DIAGNOSIS — T148XXA Other injury of unspecified body region, initial encounter: Secondary | ICD-10-CM

## 2021-05-07 DIAGNOSIS — Z87828 Personal history of other (healed) physical injury and trauma: Secondary | ICD-10-CM

## 2021-05-07 MED ORDER — POTASSIUM CHLORIDE CRYS ER 10 MEQ PO TBCR: 20.0000 meq | EXTENDED_RELEASE_TABLET | Freq: Every day | ORAL | 1 refills | Status: DC

## 2021-05-07 NOTE — Progress Notes (Signed)
° °  Subjective:    Patient ID: Kristin Merritt, female    DOB: July 14, 1960, 60 y.o.   MRN: [id number]  HPI 60 year old female who  has a past medical history of Allergy, Anemia, Anxiety, Asthma, Constipation, Depression, GERD (gastroesophageal reflux disease), Heart murmur, High cholesterol, Hypertension, Leg cramps, Lichen sclerosus et atrophicus, Numbness and tingling in hands, Postmenopausal HRT (hormone replacement therapy) - followed by Dr. Toney Rakes in gyn (07/11/2012), Swelling of both hands, Uterine cancer ([place]) (1991), UTI (urinary tract infection), VIN III (vulvar intraepithelial neoplasia III), and VIN III (vulvar intraepithelial neoplasia III).  She presents to the office today for concern of a blister in between her fourth and fifth toe on her right foot. First noticed about 6 days ago. Denies pain or drainage    Review of Systems See HPI    Objective:   Physical Exam Vitals and nursing note reviewed.  Constitutional:      Appearance: Normal appearance.  Skin:    General: Skin is warm and dry.     Comments: Appears to have an area between the 4 and 5th toe where she did have a blister but this has healed well. No signs of skin break down.   Neurological:     General: No focal deficit present.     Mental Status: She is alert and oriented to person, place, and time.  Psychiatric:        Mood and Affect: Mood normal.        Behavior: Behavior normal.        Thought Content: Thought content normal.        Judgment: Judgment normal.      Assessment & Plan:  1. Blister - Resolved.  - No treatment warranted  Dorothyann Peng, NP

## 2021-05-12 ENCOUNTER — Telehealth: Payer: Self-pay | Admitting: Adult Health

## 2021-05-12 NOTE — Telephone Encounter (Signed)
Left message for patient to call back and schedule Medicare Annual Wellness Visit (AWV) either virtually or in office. Left  my Kristin Merritt number [phone]   Last AWV 05/01/20  please schedule at anytime with [facility] Nurse Health Advisor 1 or 2   This should be a 45 minute visit.

## 2021-05-26 ENCOUNTER — Ambulatory Visit (INDEPENDENT_AMBULATORY_CARE_PROVIDER_SITE_OTHER): Payer: Medicare HMO

## 2021-05-26 VITALS — Ht 66.0 in | Wt 200.0 lb

## 2021-05-26 DIAGNOSIS — Z0000 Encounter for general adult medical examination without abnormal findings: Secondary | ICD-10-CM | POA: Diagnosis not present

## 2021-05-26 NOTE — Patient Instructions (Signed)
Kristin Merritt , Thank you for taking time to come for your Medicare Wellness Visit. I appreciate your ongoing commitment to your health goals. Please review the following plan we discussed and let me know if I can assist you in the future.   Screening recommendations/referrals: Colonoscopy: completed 08/13/2016 Mammogram: completed 02/25/2021 Bone Density: completed 08/10/2017 Recommended yearly ophthalmology/optometry visit for glaucoma screening and checkup Recommended yearly dental visit for hygiene and checkup  Vaccinations: Influenza vaccine: completed 01/23/2021 Pneumococcal vaccine: completed 01/15/2021 Tdap vaccine: completed 01/09/2013, due 8/192024 Shingles vaccine: discussed  Covid-19:  05/08/2021, 03/03/2020, 09/12/2019, 08/20/2019  Advanced directives: Advance directive discussed with you today.   Conditions/risks identified: none  Next appointment: Follow up in one year for your annual wellness visit.   Preventive Care 40-64 Years, Female Preventive care refers to lifestyle choices and visits with your health care provider that can promote health and wellness. What does preventive care include? A yearly physical exam. This is also called an annual well check. Dental exams once or twice a year. Routine eye exams. Ask your health care provider how often you should have your eyes checked. Personal lifestyle choices, including: Daily care of your teeth and gums. Regular physical activity. Eating a healthy diet. Avoiding tobacco and drug use. Limiting alcohol use. Practicing safe sex. Taking low-dose aspirin daily starting at age 17. Taking vitamin and mineral supplements as recommended by your health care provider. What happens during an annual well check? The services and screenings done by your health care provider during your annual well check will depend on your age, overall health, lifestyle risk factors, and family history of disease. Counseling  Your health care provider  may ask you questions about your: Alcohol use. Tobacco use. Drug use. Emotional well-being. Home and relationship well-being. Sexual activity. Eating habits. Work and work Statistician. Method of birth control. Menstrual cycle. Pregnancy history. Screening  You may have the following tests or measurements: Height, weight, and BMI. Blood pressure. Lipid and cholesterol levels. These may be checked every 5 years, or more frequently if you are over 23 years old. Skin check. Lung cancer screening. You may have this screening every year starting at age 64 if you have a 30-pack-year history of smoking and currently smoke or have quit within the past 15 years. Fecal occult blood test (FOBT) of the stool. You may have this test every year starting at age 50. Flexible sigmoidoscopy or colonoscopy. You may have a sigmoidoscopy every 5 years or a colonoscopy every 10 years starting at age 52. Hepatitis C blood test. Hepatitis B blood test. Sexually transmitted disease (STD) testing. Diabetes screening. This is done by checking your blood sugar (glucose) after you have not eaten for a while (fasting). You may have this done every 1-3 years. Mammogram. This may be done every 1-2 years. Talk to your health care provider about when you should start having regular mammograms. This may depend on whether you have a family history of breast cancer. BRCA-related cancer screening. This may be done if you have a family history of breast, ovarian, tubal, or peritoneal cancers. Pelvic exam and Pap test. This may be done every 3 years starting at age 83. Starting at age 51, this may be done every 5 years if you have a Pap test in combination with an HPV test. Bone density scan. This is done to screen for osteoporosis. You may have this scan if you are at high risk for osteoporosis. Discuss your test results, treatment options, and if necessary,  the need for more tests with your health care provider. Vaccines   Your health care provider may recommend certain vaccines, such as: Influenza vaccine. This is recommended every year. Tetanus, diphtheria, and acellular pertussis (Tdap, Td) vaccine. You may need a Td booster every 10 years. Zoster vaccine. You may need this after age 28. Pneumococcal 13-valent conjugate (PCV13) vaccine. You may need this if you have certain conditions and were not previously vaccinated. Pneumococcal polysaccharide (PPSV23) vaccine. You may need one or two doses if you smoke cigarettes or if you have certain conditions. Talk to your health care provider about which screenings and vaccines you need and how often you need them. This information is not intended to replace advice given to you by your health care provider. Make sure you discuss any questions you have with your health care provider. Document Released: 06/06/2015 Document Revised: 01/28/2016 Document Reviewed: 03/11/2015 Elsevier Interactive Patient Education  2017 [employer] Prevention in the Home Falls can cause injuries. They can happen to people of all ages. There are many things you can do to make your home safe and to help prevent falls. What can I do on the outside of my home? Regularly fix the edges of walkways and driveways and fix any cracks. Remove anything that might make you trip as you walk through a door, such as a raised step or threshold. Trim any bushes or trees on the path to your home. Use bright outdoor lighting. Clear any walking paths of anything that might make someone trip, such as rocks or tools. Regularly check to see if handrails are loose or broken. Make sure that both sides of any steps have handrails. Any raised decks and porches should have guardrails on the edges. Have any leaves, snow, or ice cleared regularly. Use sand or salt on walking paths during winter. Clean up any spills in your garage right away. This includes oil or grease spills. What can I do in the  bathroom? Use night lights. Install grab bars by the toilet and in the tub and shower. Do not use towel bars as grab bars. Use non-skid mats or decals in the tub or shower. If you need to sit down in the shower, use a plastic, non-slip stool. Keep the floor dry. Clean up any water that spills on the floor as soon as it happens. Remove soap buildup in the tub or shower regularly. Attach bath mats securely with double-sided non-slip rug tape. Do not have throw rugs and other things on the floor that can make you trip. What can I do in the bedroom? Use night lights. Make sure that you have a light by your bed that is easy to reach. Do not use any sheets or blankets that are too big for your bed. They should not hang down onto the floor. Have a firm chair that has side arms. You can use this for support while you get dressed. Do not have throw rugs and other things on the floor that can make you trip. What can I do in the kitchen? Clean up any spills right away. Avoid walking on wet floors. Keep items that you use a lot in easy-to-reach places. If you need to reach something above you, use a strong step stool that has a grab bar. Keep electrical cords out of the way. Do not use floor polish or wax that makes floors slippery. If you must use wax, use non-skid floor wax. Do not have throw rugs  and other things on the floor that can make you trip. What can I do with my stairs? Do not leave any items on the stairs. Make sure that there are handrails on both sides of the stairs and use them. Fix handrails that are broken or loose. Make sure that handrails are as long as the stairways. Check any carpeting to make sure that it is firmly attached to the stairs. Fix any carpet that is loose or worn. Avoid having throw rugs at the top or bottom of the stairs. If you do have throw rugs, attach them to the floor with carpet tape. Make sure that you have a light switch at the top of the stairs and the  bottom of the stairs. If you do not have them, ask someone to add them for you. What else can I do to help prevent falls? Wear shoes that: Do not have high heels. Have rubber bottoms. Are comfortable and fit you well. Are closed at the toe. Do not wear sandals. If you use a stepladder: Make sure that it is fully opened. Do not climb a closed stepladder. Make sure that both sides of the stepladder are locked into place. Ask someone to hold it for you, if possible. Clearly mark and make sure that you can see: Any grab bars or handrails. First and last steps. Where the edge of each step is. Use tools that help you move around (mobility aids) if they are needed. These include: Canes. Walkers. Scooters. Crutches. Turn on the lights when you go into a dark area. Replace any light bulbs as soon as they burn out. Set up your furniture so you have a clear path. Avoid moving your furniture around. If any of your floors are uneven, fix them. If there are any pets around you, be aware of where they are. Review your medicines with your doctor. Some medicines can make you feel dizzy. This can increase your chance of falling. Ask your doctor what other things that you can do to help prevent falls. This information is not intended to replace advice given to you by your health care provider. Make sure you discuss any questions you have with your health care provider. Document Released: 03/06/2009 Document Revised: 10/16/2015 Document Reviewed: 06/14/2014 Elsevier Interactive Patient Education  2017 [employer].

## 2021-05-26 NOTE — Progress Notes (Signed)
I connected with  Kristin Merritt today via telehealth video enabled device and verified that I am speaking with the correct person using two identifiers.   Location: Patient: home Provider: work  Persons participating in virtual visit: Kristin Merritt, Kristin Merritt  I discussed the limitations, risks, security and privacy concerns of performing an evaluation and management service by video and the availability of in person appointments. The patient expressed understanding and agreed to proceed.   Some vital signs may be absent or patient reported.     Subjective:   Kristin Merritt is a 61 y.o. female who presents for Medicare Annual (Subsequent) preventive examination.  Review of Systems     Cardiac Risk Factors include: advanced age (>41men, >26 women);dyslipidemia;hypertension;obesity (BMI >30kg/m2)     Objective:    Today's Vitals   05/26/21 1544  Weight: 200 lb (90.7 kg)  Height: 5\' 6"  (1.676 m)   Body mass index is 32.28 kg/m.  Advanced Directives 05/26/2021 05/01/2020 07/29/2016 07/14/2016 12/01/2015 11/12/2015 03/21/2015  Does Patient Have a Medical Advance Directive? No No No No No No No  Would patient like information on creating a medical advance directive? - No - Patient declined - Yes (MAU/Ambulatory/Procedural Areas - Information given) No - patient declined information Yes - Educational materials given No - patient declined information    Current Medications (verified) Outpatient Encounter Medications as of 05/26/2021  Medication Sig   Albuterol Sulfate (PROAIR HFA IN) Inhale into the lungs. 1-2 puffs as needed every 4-6 hours for cough and wheezing   atorvastatin (LIPITOR) 20 MG tablet Take 1 tablet (20 mg total) by mouth daily.   azelastine (OPTIVAR) 0.05 % ophthalmic solution Apply to eye.   Azelastine HCl (OPTIVAR OP) 1 drop into affected eye   Azelastine-Fluticasone 137-50 MCG/ACT SUSP 1 puff in each nostril   EPIPEN 2-PAK 0.3 MG/0.3ML SOAJ injection     ibuprofen (ADVIL) 800 MG tablet Take 1 tablet (800 mg total) by mouth 3 (three) times daily as needed.   lansoprazole (PREVACID) 30 MG capsule TAKE 1 CAPSULE BY MOUTH ONCE DAILY ONCE A DAY   levocetirizine (XYZAL) 5 MG tablet Take 5 mg by mouth every evening.    losartan-hydrochlorothiazide (HYZAAR) 100-25 MG tablet TAKE 1 TABLET BY MOUTH EVERY DAY   metFORMIN (GLUCOPHAGE) 500 MG tablet Take 1 tablet (500 mg total) by mouth 2 (two) times daily with a meal.   montelukast (SINGULAIR) 10 MG tablet Take 10 mg by mouth at bedtime.   potassium chloride (KLOR-CON M) 10 MEQ tablet Take 2 tablets (20 mEq total) by mouth daily.   docusate sodium (COLACE) 100 MG capsule  (Patient not taking: Reported on 05/26/2021)   estradiol (CLIMARA - DOSED IN MG/24 HR) 0.0375 mg/24hr patch Place 1 patch (0.0375 mg total) onto the skin once a week. (Patient not taking: Reported on 05/26/2021)   FIBER ADULT GUMMIES PO Take by mouth. (Patient not taking: Reported on 05/26/2021)   fluticasone (FLONASE) 50 MCG/ACT nasal spray Place 2 sprays into both nostrils daily. (Patient not taking: Reported on 05/26/2021)   Fluticasone-Salmeterol (ADVAIR) 250-50 MCG/DOSE AEPB Inhale 1 puff into the lungs as needed. (Patient not taking: Reported on 05/26/2021)   Omeprazole Magnesium (PRILOSEC) 10 MG PACK  (Patient not taking: Reported on 05/26/2021)   No facility-administered encounter medications on file as of 05/26/2021.    Allergies (verified) Percocet [oxycodone-acetaminophen]   History: Past Medical History:  Diagnosis Date   Allergy    Anemia    as child  Anxiety    Asthma    Constipation    Depression    GERD (gastroesophageal reflux disease)    Heart murmur    High cholesterol    Hypertension    Leg cramps    Lichen sclerosus et atrophicus    Numbness and tingling in hands    Postmenopausal HRT (hormone replacement therapy) - followed by Dr. 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   VIN III (vulvar intraepithelial neoplasia III)    Past Surgical History:  Procedure Laterality Date   BREAST EXCISIONAL BIOPSY Left 08/24/2016   times 2   EYE SURGERY     "Lazy Muscle" x 2- one as child and another [place] [facility] [place] BIOPSY N/A 11/18/2015   Procedure: WIDE LOCAL EXCISION OF VULVA and MEDIAL LEFT THIGH LESION ;  Surgeon: Terrance Mass, MD;  Location: [facility];  Service: Gynecology;  Laterality: N/A;   WISDOM TOOTH EXTRACTION     Family History  Problem Relation Age of Onset   Cancer Mother        LUNG    Breast cancer Mother        Died   Esophageal cancer Mother    Colon cancer Neg Hx    Colon polyps Neg Hx    Rectal cancer Neg Hx    Stomach cancer Neg Hx    Social History   Socioeconomic History   Marital status: Widowed    Spouse name: Not on file   Number of children: Not on file   Years of education: Not on file   Highest education level: Not on file  Occupational History   Not on file  Tobacco Use   Smoking status: Former    Packs/day: 0.25    Years: 20.00    Pack years: 5.00    Types: Cigarettes    Quit date: 02/21/2017    Years since quitting: 4.2   Smokeless tobacco: Never   Tobacco comments:    4 a day   Vaping Use   Vaping Use: Never used  Substance and Sexual Activity   Alcohol use: Not Currently    Comment: wine occasionally   Drug use: No   Sexual activity: Not Currently    Partners: Male    Birth control/protection: Surgical    Comment: 1st intercourse- 17, partners- 84, hysterectomy  Other Topics Concern   Not on file  Social History Narrative   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 Enjoys walking   Diet: Tries to watch what she eats. Does not eat a  lot of processed foods or fast food.    Social Determinants of Health   Financial Resource Strain: Low Risk    Difficulty of Paying Living Expenses: Not hard at all  Food Insecurity: No Food Insecurity   Worried About Charity fundraiser in the Last Year: Never true   [place] in the Last Year: Never true  Transportation Needs: No Transportation Needs   Lack of Transportation (Medical): No  Lack of Transportation (Non-Medical): No  Physical Activity: Inactive   Days of Exercise per Week: 0 days   Minutes of Exercise per Session: 0 min  Stress: No Stress Concern Present   Feeling of Stress : Not at all  Social Connections: Not on file    Tobacco Counseling Counseling given: Not Answered Tobacco comments: 4 a day    Clinical Intake:  Pre-visit preparation completed: Yes  Pain : No/denies pain     Nutritional Status: BMI > 30  Obese Nutritional Risks: None Diabetes: No  How often do you need to have someone help you when you read instructions, pamphlets, or other written materials from your doctor or pharmacy?: 1 - Never What is the last grade level you completed in school?: 12th grade  Diabetic? no  Interpreter Needed?: No  Information entered by :: NAllen Merritt   Activities of Daily Living In your present state of health, do you have any difficulty performing the following activities: 05/26/2021  Hearing? N  Vision? N  Difficulty concentrating or making decisions? N  Walking or climbing stairs? N  Dressing or bathing? N  Doing errands, shopping? N  Preparing Food and eating ? N  Using the Toilet? N  In the past six months, have you accidently leaked urine? N  Do you have problems with loss of bowel control? N  Managing your Medications? N  Managing your Finances? N  Housekeeping or managing your Housekeeping? N  Some recent data might be hidden    Patient Care Team: Dorothyann Peng, NP as PCP - General (Family Medicine) Terrance Mass, MD  (Inactive) (Obstetrics and Gynecology) Mosetta Anis, MD as Referring Physician (Allergy)  Indicate any recent Medical Services you may have received from other than [facility] providers in the past year (date may be approximate).     Assessment:   This is a routine wellness examination for Orlene.  Hearing/Vision screen Vision Screening - Comments:: Regular eye exams  Dietary issues and exercise activities discussed: Current Exercise Habits: The patient does not participate in regular exercise at present   Goals Addressed             This Visit's Progress    Patient Stated       05/26/2021, wants to weigh 180-185 pounds       Depression Screen PHQ 2/9 Scores 05/26/2021 05/07/2021 05/01/2020 04/02/2020 01/30/2015 12/17/2014 10/03/2014  PHQ - 2 Score 0 0 0 0 3 3 0  PHQ- 9 Score - - 0 1 - 7 -    Fall Risk Fall Risk  05/26/2021 05/01/2020 04/02/2020 01/30/2015 12/17/2014  Falls in the past year? 0 0 0 No No  Number falls in past yr: - 0 0 - -  Injury with Fall? - 0 0 - -  Risk for fall due to : Medication side effect No Fall Risks - Medication side effect -  Follow up Falls evaluation completed;Education provided;Falls prevention discussed Falls evaluation completed;Falls prevention discussed - - -    FALL RISK PREVENTION PERTAINING TO THE HOME:  Any stairs in or around the home? Yes  If so, are there any without handrails? No  Home free of loose throw rugs in walkways, pet beds, electrical cords, etc? Yes  Adequate lighting in your home to reduce risk of falls? Yes   ASSISTIVE DEVICES UTILIZED TO PREVENT FALLS:  Life alert? No  Use of a cane, walker or w/c? No  Grab bars in the bathroom? Yes  Shower chair or bench  in shower? No  Elevated toilet seat or a handicapped toilet? Yes   TIMED UP AND GO:  Was the test performed? No .      Cognitive Function:     6CIT Screen 05/26/2021  What Year? 0 points  What month? 0 points  What time? 0 points  Count back from 20 0 points   Months in reverse 0 points  Repeat phrase 0 points  Total Score 0    Immunizations Immunization History  Administered Date(s) Administered   Influenza, High Dose Seasonal PF 05/04/2013, 07/27/2017   Influenza,inj,Quad PF,6+ Mos 01/29/2013, 02/13/2014, 02/21/2015, 03/19/2016, 03/09/2017, 01/23/2021   Influenza-Unspecified 02/21/2018, 03/07/2020   Moderna Covid-19 Vaccine Bivalent Booster 21yrs & up 05/08/2021   Moderna Sars-Covid-2 Vaccination 08/20/2019, 09/12/2019, 03/03/2020   PNEUMOCOCCAL CONJUGATE-20 01/15/2021   PPD Test 06/03/2014, 08/19/2015, 10/06/2016   Tdap 01/09/2013    TDAP status: Up to date  Flu Vaccine status: Up to date  Pneumococcal vaccine status: Up to date  Covid-19 vaccine status: Completed vaccines  Qualifies for Shingles Vaccine? Yes   Zostavax completed No   Shingrix Completed?: No.    Education has been provided regarding the importance of this vaccine. Patient has been advised to call insurance company to determine out of pocket expense if they have not yet received this vaccine. Advised may also receive vaccine at local pharmacy or Health Dept. Verbalized acceptance and understanding.  Screening Tests Health Maintenance  Topic Date Due   Zoster Vaccines- Shingrix (1 of 2) Never done   PAP SMEAR-Modifier  07/20/2020   MAMMOGRAM  02/25/2022   TETANUS/TDAP  01/10/2023   COLONOSCOPY (Pts 45-76yrs Insurance coverage will need to be confirmed)  08/14/2026   Pneumococcal Vaccine 53-17 Years old  Completed   INFLUENZA VACCINE  Completed   COVID-19 Vaccine  Completed   Hepatitis C Screening  Completed   HIV Screening  Completed   HPV VACCINES  Aged Out    Health Maintenance  Health Maintenance Due  Topic Date Due   Zoster Vaccines- Shingrix (1 of 2) Never done   PAP SMEAR-Modifier  07/20/2020    Colorectal cancer screening: Type of screening: Colonoscopy. Completed 08/13/2016. Repeat every 10 years  Mammogram status: Completed 02/25/2021.  Repeat every year  Bone Density status: completed 08/10/2017  Lung Cancer Screening: (Low Dose CT Chest recommended if Age 36-80 years, 30 pack-year currently smoking OR have quit w/in 15years.) does not qualify.   Lung Cancer Screening Referral: no  Additional Screening:  Hepatitis C Screening: does qualify; Completed 04/07/2021  Vision Screening: Recommended annual ophthalmology exams for early detection of glaucoma and other disorders of the eye. Is the patient up to date with their annual eye exam?  No  Who is the provider or what is the name of the office in which the patient attends annual eye exams? none If pt is not established with a provider, would they like to be referred to a provider to establish care? No .   Dental Screening: Recommended annual dental exams for proper oral hygiene  Community Resource Referral / Chronic Care Management: CRR required this visit?  No   CCM required this visit?  No      Plan:     I have personally reviewed and noted the following in the patients chart:   Medical and social history Use of alcohol, tobacco or illicit drugs  Current medications and supplements including opioid prescriptions.  Functional ability and status Nutritional status Physical activity Advanced directives List of other  physicians Hospitalizations, surgeries, and ER visits in previous 12 months Vitals Screenings to include cognitive, depression, and falls Referrals and appointments  In addition, I have reviewed and discussed with patient certain preventive protocols, quality metrics, and best practice recommendations. A written personalized care plan for preventive services as well as general preventive health recommendations were provided to patient.     Kellie Simmering, Merritt   01/24/2418   Nurse Notes: none

## 2021-06-16 ENCOUNTER — Other Ambulatory Visit (HOSPITAL_COMMUNITY)
Admission: RE | Admit: 2021-06-16 | Discharge: 2021-06-16 | Disposition: A | Discharge status: Home / Self Care | Payer: Medicare HMO | Source: Ambulatory Visit | Attending: Obstetrics & Gynecology | Admitting: Obstetrics & Gynecology

## 2021-06-16 ENCOUNTER — Encounter: Payer: Self-pay | Admitting: Obstetrics & Gynecology

## 2021-06-16 ENCOUNTER — Other Ambulatory Visit: Payer: Self-pay

## 2021-06-16 ENCOUNTER — Ambulatory Visit (INDEPENDENT_AMBULATORY_CARE_PROVIDER_SITE_OTHER): Payer: Medicare HMO | Admitting: Obstetrics & Gynecology

## 2021-06-16 VITALS — BP 114/70 | HR 76 | Resp 16 | Ht 65.75 in | Wt 203.0 lb

## 2021-06-16 DIAGNOSIS — Z780 Asymptomatic menopausal state: Secondary | ICD-10-CM | POA: Diagnosis not present

## 2021-06-16 DIAGNOSIS — E6609 Other obesity due to excess calories: Secondary | ICD-10-CM

## 2021-06-16 DIAGNOSIS — Z90710 Acquired absence of both cervix and uterus: Secondary | ICD-10-CM | POA: Diagnosis not present

## 2021-06-16 DIAGNOSIS — Z9189 Other specified personal risk factors, not elsewhere classified: Secondary | ICD-10-CM | POA: Diagnosis not present

## 2021-06-16 DIAGNOSIS — Z01419 Encounter for gynecological examination (general) (routine) without abnormal findings: Secondary | ICD-10-CM

## 2021-06-16 DIAGNOSIS — Z9289 Personal history of other medical treatment: Secondary | ICD-10-CM | POA: Diagnosis not present

## 2021-06-16 DIAGNOSIS — C519 Malignant neoplasm of vulva, unspecified: Secondary | ICD-10-CM | POA: Diagnosis not present

## 2021-06-16 DIAGNOSIS — N879 Dysplasia of cervix uteri, unspecified: Secondary | ICD-10-CM | POA: Diagnosis not present

## 2021-06-16 DIAGNOSIS — Z6833 Body mass index (BMI) 33.0-33.9, adult: Secondary | ICD-10-CM

## 2021-06-16 NOTE — Progress Notes (Signed)
Kristin Merritt 1960-08-11 [id number]   History:    61 y.o. G1P1L1 Boyfriend  RP:  Established patient presenting for annual gyn exam   HPI: Status post total hysterectomy for Cervical Dysplasia.  Pap ASCUS/HPV HR Neg in 06/2017.  Pap reflex today.  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  Vulvar Bx with Dr Denman George in 06/2016 showed no dysplasia.  No vulvar colposcopy since then.  Postmenopause, on no HRT.  Hot flushes/night sweats still present.  The Estradiol patch was not sticking at all, so stopped.  Will try Ashwagandha at this time.  No pelvic pain.  Breasts wnl. Mammo 02/2021 Neg.  Urine/BMs wnl.  BMI 33.01.  Health labs with Fam MD.  BMD Normal 08-10-17.  COLONOSCOPY: 08-13-16.  Past medical history,surgical history, family history and social history were all reviewed and documented in the EPIC chart.  Gynecologic History No LMP recorded. Patient has had a hysterectomy.  Obstetric History OB History  Gravida Para Term Preterm AB Living  1 1       1   SAB IAB Ectopic Multiple Live Births               # Outcome Date GA Lbr Len/2nd Weight Sex Delivery Anes PTL Lv  1 Para              ROS: A ROS was performed and pertinent positives and negatives are included in the history.  GENERAL: No fevers or chills. HEENT: No change in vision, no earache, sore throat or sinus congestion. NECK: No pain or stiffness. CARDIOVASCULAR: No chest pain or pressure. No palpitations. PULMONARY: No shortness of breath, cough or wheeze. GASTROINTESTINAL: No abdominal pain, nausea, vomiting or diarrhea, melena or bright red blood per rectum. GENITOURINARY: No urinary frequency, urgency, hesitancy or dysuria. MUSCULOSKELETAL: No joint or muscle pain, no back pain, no recent trauma. DERMATOLOGIC: No rash, no itching, no lesions. ENDOCRINE: No polyuria, polydipsia, no heat or cold intolerance. No recent change in weight. HEMATOLOGICAL: No anemia or easy bruising or bleeding.  NEUROLOGIC: No headache, seizures, numbness, tingling or weakness. PSYCHIATRIC: No depression, no loss of interest in normal activity or change in sleep pattern.     Exam:   BP 114/70    Pulse 76    Resp 16    Ht 5' 5.75" (1.67 m)    Wt 203 lb (92.1 kg)    BMI 33.01 kg/m   Body mass index is 33.01 kg/m.  General appearance : Well developed well nourished female. No acute distress HEENT: Eyes: no retinal hemorrhage or exudates,  Neck supple, trachea midline, no carotid bruits, no thyroidmegaly Lungs: Clear to auscultation, no rhonchi or wheezes, or rib retractions  Heart: Regular rate and rhythm, no murmurs or gallops Breast:Examined in sitting and supine position were symmetrical in appearance, no palpable masses or tenderness,  no skin retraction, no nipple inversion, no nipple discharge, no skin discoloration, no axillary or supraclavicular lymphadenopathy Abdomen: no palpable masses or tenderness, no rebound or guarding Extremities: no edema or skin discoloration or tenderness  Pelvic: Vulva: Normal             Vagina: No gross lesions or discharge.  Pap reflex done.  Cervix/Uterus absent  Adnexa  Without masses or tenderness  Anus: Normal   Assessment/Plan:  61 y.o. female for annual exam   1. Encounter for routine gynecological examination with Papanicolaou smear of cervix Status post total hysterectomy for Cervical Dysplasia.  Pap ASCUS/HPV HR Neg in 06/2017.  Pap reflex today.  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 Vulvar Bx with Dr Denman George in 06/2016 showed no dysplasia.  No vulvar colposcopy since then.  Postmenopause, on no HRT.  Hot flushes/night sweats still present.  The Estradiol patch was not sticking at all, so stopped.  Will try Ashwagandha at this time.  No pelvic pain.  Breasts wnl. Mammo 02/2021 Neg.  Urine/BMs wnl.  BMI 33.01.  Health labs with Fam MD.  BMD Normal 08-10-17.  COLONOSCOPY: 08-13-16. - Cytology - PAP( [facility])  2. S/P total hysterectomy  3. H/O Dysplasia of cervix Status post total hysterectomy for Cervical Dysplasia.   4. 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 Vulvar Bx with Dr Denman George in 06/2016 showed no dysplasia.  No vulvar colposcopy since then.   5. Postmenopause Postmenopause, on no HRT.  Hot flushes/night sweats still present.  The Estradiol patch was not sticking at all, so stopped.  Will try Ashwagandha at this time. BMD Normal 08-10-17.    6. Class 1 obesity due to excess calories without serious comorbidity with body mass index (BMI) of 33.0 to 33.9 in adult Lower Calorie/Carb diet.  Increase fitness activities.   Other orders - VITAMIN D PO; Take 400 Int'l Units by mouth.   Princess Bruins MD, 4:05 PM 06/16/2021

## 2021-06-17 LAB — CYTOLOGY - PAP
Adequacy: ABSENT
Diagnosis: NEGATIVE

## 2021-07-02 ENCOUNTER — Other Ambulatory Visit: Payer: Self-pay | Admitting: Adult Health

## 2021-09-28 ENCOUNTER — Other Ambulatory Visit: Payer: Self-pay | Admitting: Adult Health

## 2021-11-04 ENCOUNTER — Other Ambulatory Visit: Payer: Self-pay | Admitting: Adult Health

## 2021-12-11 ENCOUNTER — Other Ambulatory Visit: Payer: Self-pay | Admitting: Adult Health

## 2021-12-24 ENCOUNTER — Other Ambulatory Visit: Payer: Self-pay | Admitting: Adult Health

## 2022-01-06 ENCOUNTER — Other Ambulatory Visit: Payer: Self-pay | Admitting: Adult Health

## 2022-01-06 DIAGNOSIS — I10 Essential (primary) hypertension: Secondary | ICD-10-CM

## 2022-02-08 ENCOUNTER — Telehealth: Payer: Self-pay

## 2022-02-08 NOTE — Telephone Encounter (Signed)
--  Caller states she has had intermittent dizziness for about a week with some elevated blood pressure 140s/80s. Denies chest pain. describes dizziness as lightheaded and reports she is concerned she may have a sinus infection.  02/05/2022 5:19:39 PM SEE PCP WITHIN 3 DAYS Yes Frederic Jericho RN, Sara  Referrals REFERRED TO PCP OFFICE  02/08/22 1443 - Pt has not scheduled appt. Called to schedule, is requesting Friday afternoon. Pt states dizziness happens mostly in mornings.

## 2022-02-12 ENCOUNTER — Ambulatory Visit (INDEPENDENT_AMBULATORY_CARE_PROVIDER_SITE_OTHER): Payer: Medicare HMO | Admitting: Adult Health

## 2022-02-12 ENCOUNTER — Ambulatory Visit (INDEPENDENT_AMBULATORY_CARE_PROVIDER_SITE_OTHER): Payer: Medicare HMO

## 2022-02-12 VITALS — BP 130/80 | HR 85 | Temp 98.0°F | Ht 67.5 in | Wt 208.0 lb

## 2022-02-12 DIAGNOSIS — I10 Essential (primary) hypertension: Secondary | ICD-10-CM

## 2022-02-12 DIAGNOSIS — R42 Dizziness and giddiness: Secondary | ICD-10-CM | POA: Diagnosis not present

## 2022-02-12 DIAGNOSIS — J0100 Acute maxillary sinusitis, unspecified: Secondary | ICD-10-CM

## 2022-02-12 DIAGNOSIS — M545 Low back pain, unspecified: Secondary | ICD-10-CM

## 2022-02-12 MED ORDER — FLUCONAZOLE 150 MG PO TABS: ORAL_TABLET | ORAL | 0 refills | Status: DC

## 2022-02-12 MED ORDER — DOXYCYCLINE HYCLATE 100 MG PO CAPS: 100.0000 mg | ORAL_CAPSULE | Freq: Two times a day (BID) | ORAL | 0 refills | Status: DC

## 2022-02-12 NOTE — Patient Instructions (Signed)
It was great seeing you today   I have sent in an antibiotic for your sinus infection

## 2022-02-12 NOTE — Progress Notes (Signed)
Subjective:    Patient ID: Kristin Merritt, female    DOB: [birth year], 61 y.o.   MRN: [id number]  HPI  61 year old female who is being evaluated today for multiple issues.  Her first issue is that of concern for elevated blood pressure reading.  She reports that she started to feel dizzy at home over the last week and a half and so she started checking her blood pressure readings.  Her blood pressure readings per her log show blood pressure is between 540 -086P systolic with most readings in the 120s to 130s.  He is taking her blood pressure medication as directed  BP Readings from Last 3 Encounters:  02/12/22 130/80  06/16/21 114/70  05/07/21 122/70   She also reports that over the last week and a half she started to develop sinus pain and pressure, headache, nasal congestion, and discolored rhinorrhea.  He has not had any fevers or chills.  She also complains of worsening low back pain, does have a history of L5-S1 herniated disc and spinal stenosis.  Prior lumbar spine surgery in 1997.  Her last MRI was in June 2016.  She feels as though the pain has gotten worse and is gone up into her upper lumbar spine.  Has pain with sitting, walking, and standing. .  Review of Systems See HPI   Past Medical History:  Diagnosis Date   Allergy    Anemia    as child   Anxiety    Asthma    Constipation    Depression    GERD (gastroesophageal reflux disease)    Heart murmur    High cholesterol    Hypertension    Leg cramps    Lichen sclerosus et atrophicus    Numbness and tingling in hands    Postmenopausal HRT (hormone replacement therapy) - followed by Dr. 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   VIN III (vulvar intraepithelial neoplasia III)     Social History   Socioeconomic History   Marital status: Widowed    Spouse name: Not on  file   Number of children: Not on file   Years of education: Not on file   Highest education level: Not on file  Occupational History   Not on file  Tobacco Use   Smoking status: Former    Packs/day: 0.25    Years: 20.00    Total pack years: 5.00    Types: Cigarettes    Quit date: 02/21/2017    Years since quitting: 4.9   Smokeless tobacco: Never  Vaping Use   Vaping Use: Never used  Substance and Sexual Activity   Alcohol use: Not Currently   Drug use: No   Sexual activity: Not Currently    Partners: Male    Birth control/protection: Surgical    Comment: 1st intercourse- 17, partners- 29, hysterectomy  Other Topics Concern   Not on file  Social History Narrative   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 Enjoys walking   Diet: Tries to watch what she eats. Does not eat a lot of processed foods or fast food.    Social  Determinants of Health   Financial Resource Strain: Low Risk  (05/26/2021)   Overall Financial Resource Strain (CARDIA)    Difficulty of Paying Living Expenses: Not hard at all  Food Insecurity: No Food Insecurity (05/26/2021)   Hunger Vital Sign    Worried About Running Out of Food in the Last Year: Never true    Ran Out of Food in the Last Year: Never true  Transportation Needs: No Transportation Needs (05/26/2021)   PRAPARE - Hydrologist (Medical): No    Lack of Transportation (Non-Medical): No  Physical Activity: Inactive (05/26/2021)   Exercise Vital Sign    Days of Exercise per Week: 0 days    Minutes of Exercise per Session: 0 min  Stress: No Stress Concern Present (05/26/2021)   [place]    Feeling of Stress : Not at all  Social Connections: Moderately Isolated (05/01/2020)   Social Connection and Isolation Panel [NHANES]    Frequency of  Communication with Friends and Family: More than three times a week    Frequency of Social Gatherings with Friends and Family: More than three times a week    Attends Religious Services: More than 4 times per year    Active Member of [employer] or Organizations: No    Attends Archivist Meetings: Never    Marital Status: Widowed  Intimate Partner Violence: Not At Risk (05/01/2020)   Humiliation, Afraid, Rape, and Kick questionnaire    Fear of Current or Ex-Partner: No    Emotionally Abused: No    Physically Abused: No    Sexually Abused: No    Past Surgical History:  Procedure Laterality Date   BREAST EXCISIONAL BIOPSY Left 08/24/2016   times 2   EYE SURGERY     "Lazy Muscle" x 2- one as child and another [place] BIOPSY N/A 11/18/2015   Procedure: WIDE LOCAL EXCISION OF VULVA and MEDIAL LEFT THIGH LESION ;  Surgeon: Terrance Mass, MD;  Location: [facility];  Service: Gynecology;  Laterality: N/A;   WISDOM TOOTH EXTRACTION      Family History  Problem Relation Age of Onset   Cancer Mother        LUNG    Breast cancer Mother        Died   Esophageal cancer Mother    Colon cancer Neg Hx    Colon polyps Neg Hx    Rectal cancer Neg Hx    Stomach cancer Neg Hx     Allergies  Allergen Reactions   Percocet [Oxycodone-Acetaminophen] Itching    Current Outpatient Medications on File Prior to Visit  Medication Sig Dispense Refill   Albuterol Sulfate (PROAIR HFA IN) Inhale into the lungs. 1-2 puffs as needed every 4-6 hours for cough and wheezing     atorvastatin (LIPITOR) 20 MG tablet Take 1 tablet (20 mg total) by mouth daily. 90 tablet 3   azelastine (OPTIVAR) 0.05 % ophthalmic solution Apply to eye.     Azelastine-Fluticasone 137-50 MCG/ACT SUSP 1 puff in each nostril     docusate sodium (COLACE) 100 MG capsule      EPIPEN 2-PAK 0.3 MG/0.3ML SOAJ injection      FIBER ADULT GUMMIES PO Take by mouth.      ibuprofen (ADVIL) 800 MG tablet Take 1 tablet (800 mg total) by mouth  3 (three) times daily as needed. 30 tablet 2   KLOR-CON M10 10 MEQ tablet TAKE 2 TABLETS BY MOUTH DAILY 180 tablet 1   lansoprazole (PREVACID) 30 MG capsule TAKE 1 CAPSULE BY MOUTH ONCE DAILY ONCE A DAY 90 capsule 3   levocetirizine (XYZAL) 5 MG tablet Take 5 mg by mouth every evening.      losartan-hydrochlorothiazide (HYZAAR) 100-25 MG tablet TAKE 1 TABLET BY MOUTH EVERY DAY 90 tablet 3   metFORMIN (GLUCOPHAGE) 500 MG tablet TAKE 1 TABLET BY MOUTH TWICE A DAY WITH MEALS 180 tablet 0   montelukast (SINGULAIR) 10 MG tablet Take 10 mg by mouth at bedtime.     montelukast (SINGULAIR) 10 MG tablet every evening.     VITAMIN D PO Take 400 Int'l Units by mouth.     No current facility-administered medications on file prior to visit.    BP 130/80   Pulse 85   Temp 98 F (36.7 C) (Oral)   Ht 5' 7.5" (1.715 m)   Wt 208 lb (94.3 kg)   SpO2 99%   BMI 32.10 kg/m       Objective:   Physical Exam Vitals and nursing note reviewed.  Constitutional:      Appearance: Normal appearance.  HENT:     Right Ear: A middle ear effusion is present. Tympanic membrane is not erythematous.     Left Ear: A middle ear effusion is present. Tympanic membrane is not erythematous.     Nose: Congestion and rhinorrhea present. Rhinorrhea is purulent.     Right Turbinates: Enlarged and swollen.     Left Turbinates: Enlarged and swollen.     Right Sinus: Maxillary sinus tenderness and frontal sinus tenderness present.     Left Sinus: Maxillary sinus tenderness present. No frontal sinus tenderness.  Cardiovascular:     Rate and Rhythm: Normal rate and regular rhythm.     Pulses: Normal pulses.     Heart sounds: Normal heart sounds.  Pulmonary:     Effort: Pulmonary effort is normal.     Breath sounds: Normal breath sounds.  Musculoskeletal:        General: Normal range of motion.     Lumbar back: No spasms, tenderness or bony tenderness.  Normal range of motion.  Skin:    General: Skin is warm and dry.  Neurological:     General: No focal deficit present.     Mental Status: She is alert and oriented to person, place, and time.  Psychiatric:        Mood and Affect: Mood normal.        Behavior: Behavior normal.        Thought Content: Thought content normal.        Judgment: Judgment normal.       Assessment & Plan:  1. Acute non-recurrent maxillary sinusitis  - doxycycline (VIBRAMYCIN) 100 MG capsule; Take 1 capsule (100 mg total) by mouth 2 (two) times daily.  Dispense: 14 capsule; Refill: 0 - fluconazole (DIFLUCAN) 150 MG tablet; Take one tab and then three days later take the second tab  Dispense: 2 tablet; Refill: 0  2. Lumbar spine pain  - DG Lumbar Spine Complete; Future - Consider advanced imaging and referral to orthopedics  3. Essential hypertension, benign - Well controlled. No change in medications   4. Dizziness -I do not think dizziness is likely from blood pressure likely from sinusitis and should resolve once sinusitis has resolved.  Follow-up if no improvement  once antibiotics are finished  Dorothyann Peng, NP  Time spent with patient today was 36 minutes which consisted of chart review, discussing diagnosis, work up, treatment answering questions and documentation.

## 2022-02-13 ENCOUNTER — Other Ambulatory Visit: Payer: Self-pay | Admitting: Adult Health

## 2022-02-13 DIAGNOSIS — E785 Hyperlipidemia, unspecified: Secondary | ICD-10-CM

## 2022-02-13 DIAGNOSIS — I10 Essential (primary) hypertension: Secondary | ICD-10-CM

## 2022-02-17 ENCOUNTER — Telehealth: Payer: Self-pay | Admitting: Adult Health

## 2022-02-17 NOTE — Telephone Encounter (Signed)
Day #4 on the doxycycline (VIBRAMYCIN) 100 MG capsule  And Pt is stating she is experiencing congestion & nausea.  Pt says it is not working at all, and she is feeling the worst she has ever felt.  Pt is inquiring about the prednisone shot.  Pt is asking for a call back asap.

## 2022-02-17 NOTE — Telephone Encounter (Signed)
Spoke with pt, agreed to come in. Scheduled for 02/19/29 at 2:30.

## 2022-02-19 ENCOUNTER — Ambulatory Visit: Payer: Medicare HMO | Admitting: Adult Health

## 2022-02-24 ENCOUNTER — Ambulatory Visit (INDEPENDENT_AMBULATORY_CARE_PROVIDER_SITE_OTHER): Payer: Medicare HMO | Admitting: Adult Health

## 2022-02-24 VITALS — BP 108/78 | HR 84 | Temp 98.6°F | Wt 209.2 lb

## 2022-02-24 DIAGNOSIS — J302 Other seasonal allergic rhinitis: Secondary | ICD-10-CM

## 2022-02-24 MED ORDER — METHYLPREDNISOLONE ACETATE 80 MG/ML IJ SUSP
80.0000 mg | Freq: Once | INTRAMUSCULAR | Status: AC
  Administered 2022-02-24: 80 mg via INTRAMUSCULAR

## 2022-02-24 NOTE — Progress Notes (Unsigned)
Subjective:    Patient ID: Kristin Merritt, female    DOB: Aug 22, 1960, 61 y.o.   MRN: [id number]  HPI 61 year old female who  has a past medical history of Allergy, Anemia, Anxiety, Asthma, Constipation, Depression, GERD (gastroesophageal reflux disease), Heart murmur, High cholesterol, Hypertension, Leg cramps, Lichen sclerosus et atrophicus, Numbness and tingling in hands, Postmenopausal HRT (hormone replacement therapy) - followed by Dr. Toney Rakes in gyn (07/11/2012), Swelling of both hands, Uterine cancer ([place]) (1991), UTI (urinary tract infection), VIN III (vulvar intraepithelial neoplasia III), and VIN III (vulvar intraepithelial neoplasia III).  She presents to the office today for follow up re  Review of Systems See HPI   Past Medical History:  Diagnosis Date   Allergy    Anemia    as child   Anxiety    Asthma    Constipation    Depression    GERD (gastroesophageal reflux disease)    Heart murmur    High cholesterol    Hypertension    Leg cramps    Lichen sclerosus et atrophicus    Numbness and tingling in hands    Postmenopausal HRT (hormone replacement therapy) - followed by Dr. 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   VIN III (vulvar intraepithelial neoplasia III)     Social History   Socioeconomic History   Marital status: Widowed    Spouse name: Not on file   Number of children: Not on file   Years of education: Not on file   Highest education level: 12th grade  Occupational History   Not on file  Tobacco Use   Smoking status: Former    Packs/day: 0.25    Years: 20.00    Total pack years: 5.00    Types: Cigarettes    Quit date: 02/21/2017    Years since quitting: 5.0   Smokeless tobacco: Never  Vaping Use   Vaping Use: Never used  Substance and Sexual Activity   Alcohol use: Not Currently   Drug use: No    Sexual activity: Not Currently    Partners: Male    Birth control/protection: Surgical    Comment: 1st intercourse- 17, partners- 59, hysterectomy  Other Topics Concern   Not on file  Social History Narrative   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 Enjoys walking   Diet: Tries to watch what she eats. Does not eat a lot of processed foods or fast food.    Social Determinants of Health   Financial Resource Strain: Low Risk  (02/19/2022)   Overall Financial Resource Strain (CARDIA)    Difficulty of Paying Living Expenses: Not hard at all  Food Insecurity: No Food Insecurity (02/19/2022)   Hunger Vital Sign    Worried About Running Out of Food in the Last Year: Never true    Ran Out of Food in the Last Year: Never true  Transportation Needs: No Transportation Needs (02/19/2022)   PRAPARE - Hydrologist (Medical): No    Lack of Transportation (Non-Medical): No  Physical Activity: Sufficiently Active (02/19/2022)   Exercise Vital Sign    Days  of Exercise per Week: 5 days    Minutes of Exercise per Session: 30 min  Stress: No Stress Concern Present (02/19/2022)   [place]    Feeling of Stress : Not at all  Social Connections: Unknown (02/19/2022)   Social Connection and Isolation Panel [NHANES]    Frequency of Communication with Friends and Family: More than three times a week    Frequency of Social Gatherings with Friends and Family: Once a week    Attends Religious Services: Patient refused    Active Member of Clubs or Organizations: Yes    Attends Archivist Meetings: Patient refused    Marital Status: Widowed  Intimate Partner Violence: Not At Risk (05/01/2020)   Humiliation, Afraid, Rape, and Kick questionnaire    Fear of Current or Ex-Partner: No    Emotionally  Abused: No    Physically Abused: No    Sexually Abused: No    Past Surgical History:  Procedure Laterality Date   BREAST EXCISIONAL BIOPSY Left 08/24/2016   times 2   EYE SURGERY     "Lazy Muscle" x 2- one as child and another [place] BIOPSY N/A 11/18/2015   Procedure: WIDE LOCAL EXCISION OF VULVA and MEDIAL LEFT THIGH LESION ;  Surgeon: Terrance Mass, MD;  Location: [facility];  Service: Gynecology;  Laterality: N/A;   WISDOM TOOTH EXTRACTION      Family History  Problem Relation Age of Onset   Cancer Mother        LUNG    Breast cancer Mother        Died   Esophageal cancer Mother    Colon cancer Neg Hx    Colon polyps Neg Hx    Rectal cancer Neg Hx    Stomach cancer Neg Hx     Allergies  Allergen Reactions   Percocet [Oxycodone-Acetaminophen] Itching    Current Outpatient Medications on File Prior to Visit  Medication Sig Dispense Refill   Albuterol Sulfate (PROAIR HFA IN) Inhale into the lungs. 1-2 puffs as needed every 4-6 hours for cough and wheezing     atorvastatin (LIPITOR) 20 MG tablet TAKE 1 TABLET BY MOUTH EVERY DAY 90 tablet 0   azelastine (OPTIVAR) 0.05 % ophthalmic solution Apply to eye.     Azelastine-Fluticasone 137-50 MCG/ACT SUSP 1 puff in each nostril     docusate sodium (COLACE) 100 MG capsule      EPIPEN 2-PAK 0.3 MG/0.3ML SOAJ injection      FIBER ADULT GUMMIES PO Take by mouth.     ibuprofen (ADVIL) 800 MG tablet Take 1 tablet (800 mg total) by mouth 3 (three) times daily as needed. 30 tablet 2   KLOR-CON M10 10 MEQ tablet TAKE 2 TABLETS BY MOUTH DAILY 180 tablet 1   lansoprazole (PREVACID) 30 MG capsule TAKE 1 CAPSULE BY MOUTH ONCE DAILY ONCE A DAY 90 capsule 3   levocetirizine (XYZAL) 5 MG tablet Take 5 mg by mouth every evening.      losartan-hydrochlorothiazide (HYZAAR) 100-25 MG tablet TAKE 1 TABLET BY MOUTH EVERY DAY 90 tablet 3   metFORMIN (GLUCOPHAGE) 500 MG tablet TAKE  1 TABLET BY MOUTH TWICE A DAY WITH FOOD 180 tablet 0   montelukast (SINGULAIR) 10 MG tablet Take 10 mg by mouth at bedtime.     potassium chloride SA (KLOR-CON M20)  20 MEQ tablet TAKE 1 TABLET BY MOUTH EVERY DAY 90 tablet 0   VITAMIN D PO Take 400 Int'l Units by mouth.     No current facility-administered medications on file prior to visit.    BP 108/78 (BP Location: Left Arm, Patient Position: Sitting, Cuff Size: Large)   Pulse 84   Temp 98.6 F (37 C) (Oral)   Wt 209 lb 3.2 oz (94.9 kg)   SpO2 95%   BMI 32.28 kg/m       Objective:   Physical Exam Vitals and nursing note reviewed.  Constitutional:      Appearance: Normal appearance.  Skin:    General: Skin is warm and dry.     Capillary Refill: Capillary refill takes less than 2 seconds.  Neurological:     General: No focal deficit present.     Mental Status: She is alert and oriented to person, place, and time.  Psychiatric:        Mood and Affect: Mood normal.        Behavior: Behavior normal.        Thought Content: Thought content normal.        Judgment: Judgment normal.       Assessment & Plan:

## 2022-03-24 ENCOUNTER — Ambulatory Visit: Payer: Medicare HMO | Attending: Adult Health | Admitting: Physical Therapy

## 2022-03-24 ENCOUNTER — Encounter: Payer: Self-pay | Admitting: Physical Therapy

## 2022-03-24 ENCOUNTER — Ambulatory Visit: Payer: Medicare HMO

## 2022-03-24 ENCOUNTER — Other Ambulatory Visit: Payer: Self-pay

## 2022-03-24 DIAGNOSIS — G8929 Other chronic pain: Secondary | ICD-10-CM | POA: Insufficient documentation

## 2022-03-24 DIAGNOSIS — M6281 Muscle weakness (generalized): Secondary | ICD-10-CM | POA: Diagnosis present

## 2022-03-24 DIAGNOSIS — M545 Low back pain, unspecified: Secondary | ICD-10-CM | POA: Insufficient documentation

## 2022-03-24 NOTE — Therapy (Signed)
OUTPATIENT PHYSICAL THERAPY THORACOLUMBAR EVALUATION  Patient Name: Kristin Merritt MRN: [id number] DOB:January 05, 1961, 61 y.o., female Today's Date: 03/24/2022   PT End of Session - 03/24/22 1522     Visit Number 1    Number of Visits --   1-2x/week   Date for PT Re-Evaluation 05/19/22    Authorization Type [employer] MCR    Progress Note Due on Visit 10    PT Start Time 1450    PT Stop Time 1530    PT Time Calculation (min) 40 min             Past Medical History:  Diagnosis Date   Allergy    Anemia    as child   Anxiety    Asthma    Constipation    Depression    GERD (gastroesophageal reflux disease)    Heart murmur    High cholesterol    Hypertension    Leg cramps    Lichen sclerosus et atrophicus    Numbness and tingling in hands    Postmenopausal HRT (hormone replacement therapy) - followed by Dr. 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   VIN III (vulvar intraepithelial neoplasia III)    Past Surgical History:  Procedure Laterality Date   BREAST EXCISIONAL BIOPSY Left 08/24/2016   times 2   EYE SURGERY     "Lazy Muscle" x 2- one as child and another [facility] BIOPSY N/A 11/18/2015   Procedure: WIDE LOCAL EXCISION OF VULVA and MEDIAL LEFT THIGH LESION ;  Surgeon: Terrance Mass, MD;  Location: [facility];  Service: Gynecology;  Laterality: N/A;   WISDOM TOOTH EXTRACTION     Patient Active Problem List   Diagnosis Date Noted   Bilateral carpal tunnel syndrome 07/13/2017   LGSIL Pap smear of vagina 09/08/2016   Vulva cancer ([place]) 04/22/2016   Tobacco abuse 12/01/2015   History of uterine cancer 10/07/2015   VIN III (vulvar intraepithelial neoplasia III) 09/18/2015   Postlaminectomy syndrome, lumbar region 12/17/2014   Right lumbar radiculitis 39/76/7341    Lichen sclerosus 93/79/0240   History of vulvar dysplasia 04/08/2014   Asthma, chronic 08/21/2012   Essential hypertension, benign 08/21/2012   Hyperlipemia 08/21/2012   Anxiety 08/21/2012   Chronic low back pain 08/21/2012   Allergic rhinitis 08/21/2012   Recurrent UTI after sex, uses prophylactic cipro with diflucan for prevention yeast infection 08/21/2012   Symptoms, such as flushing, sleeplessness, headache, lack of concentration, associated with the menopause 07/11/2012   Postmenopausal HRT (hormone replacement therapy) - followed by Dr. Toney Rakes in gyn 07/11/2012    PCP: Dorothyann Peng, NP  REFERRING PROVIDER: Dorothyann Peng, NP  THERAPY DIAG:  Chronic low back pain, unspecified back pain laterality, unspecified whether sciatica present - Plan: PT plan of care cert/re-cert  Muscle weakness - Plan: PT plan of care cert/re-cert  REFERRING DIAG: Lumbar spine pain [M54.50]  Rationale for Evaluation and Treatment:  Rehabilitation  SUBJECTIVE:  PERTINENT PAST HISTORY:  Laminectomy (~1991)      PRECAUTIONS: None  WEIGHT BEARING RESTRICTIONS No  FALLS:  Has patient fallen in last 6 months? No, Number of falls: 0  MOI/History of condition:  Onset date: chronic >5 years  SUBJECTIVE STATEMENT  Kristin Merritt is a 61 y.o. female who presents to clinic with chief complaint of history of chronic low back pain.  This was treated in the early 90's with a laminectomy.  She continues to have low back pain since this time.  She wears a brace at times which seems to help.  She reports that she does have numbness in the R LE at times.  She denies weakness.  She endorses R sided shooting pain.     Red flags:  denies BB changes and saddle anesthesia  Pain:  Are you having pain? Yes Pain location: R sided low back pain L1 to R glute NPRS scale:  current 9/10  average 7/10  Aggravating factors: lifting, bending  NPRS, highest: 9/10 Relieving factors: rest  NPRS: best:  5/10 Pain description: constant, stabbing, and aching Stage: Chronic Stability: staying the same 24 hour pattern: stiff in morning; worse with activity   Occupation: Actuary for geriatrics   Assistive Device: NA  Hand Dominance: NA  Patient Goals/Specific Activities: reduce pain, improve limiting   OBJECTIVE:   DIAGNOSTIC FINDINGS:  IMPRESSION: 1. No evidence of acute abnormality. 2. Multilevel degenerative changes, moderate to severe at L5-S1. 3. Moderate to severe multilevel facet arthropathy, greatest in the mid and LOWER lumbar spine.  SENSATION:  Light touch: Deficits R lateral knee and thigh reduced  MUSCLE LENGTH: Hamstrings: Right no restriction; Left no restriction ASLR: Right ASLR = PSLR; Left ASLR = PSLR Thomas test: Right significant restriction; Left subtle restriction   LUMBAR SPECIAL TESTS:  Straight leg raise: L (-), R (-) Slump: L (-), R (-)  LUMBAR AROM  AROM AROM  03/24/2022  Flexion Fingertips to toes (WNL)  Extension limited by 50%  Right lateral flexion limited by 25%  Left lateral flexion limited by 25%, w/ concordant pain  Right rotation limited by 50%  Left rotation limited by 50%    (Blank rows = not tested)   LE MMT:  MMT Right 03/24/2022 Left 03/24/2022  Hip flexion (L2, L3) 5 5  Knee extension (L3) 5 5  Knee flexion 5 5  Hip abduction 4 4+  Hip extension 4 5  Hip external rotation    Hip internal rotation    Hip adduction    Ankle dorsiflexion (L4) c c  Ankle plantarflexion (S1) c c  Ankle inversion    Ankle eversion    Great Toe ext (L5) c c  Grossly     (Blank rows = not tested, score listed is out of 5 possible points.  N = WNL, D = diminished, C = clear for gross weakness with myotome testing, * = concordant pain with testing)  Functional Tests  Eval (03/24/2022)    30'' STS: 15x  UE used? N    Standard plank from feet: 39'' (norm 70'' from feet)    Sidelying plank from feet: L 17'', R 18'' (norm >30'')      Supine single leg bridge: L 30'', R 20''                                               PATIENT SURVEYS:  FOTO 61 -> 66   TODAY'S TREATMENT  Creating, reviewing, and completing below HEP  PATIENT EDUCATION:  POC, diagnosis, prognosis, HEP, and outcome measures.  Pt educated via explanation, demonstration, and handout (HEP).  Pt confirms understanding verbally.  HOME EXERCISE PROGRAM: Access Code: [email or web address] URL: [email or web address] Date: 03/24/2022 Prepared by: Shearon Balo  Exercises - Supine Lower Trunk Rotation  - 1 x daily - 7 x weekly - 1 sets - 20 reps - 3 hold - Figure 4 Bridge  - 1 x daily - 7 x weekly - 3 sets - 10 reps - Standard Plank  - 1 x daily - 7 x weekly - 1 sets - 3 reps - 15 second hold - Side Plank on Elbow  - 1 x daily - 7 x weekly - 1 sets - 3 reps - 10 sec hold  ASTERISK SIGNS   Asterisk Signs Eval (03/24/2022)       Hip flexor tightness ++       Side plank L 17'', R 18'' (norm >30'')       plank 40'' (norm 70'' from feet)       Single leg bridge L 30'', R 20''                 ASSESSMENT:  CLINICAL IMPRESSION: Kristin Merritt is a 61 y.o. female who presents to clinic with signs and sxs consistent with chronic mechanical low back pain.  She has subjective report consistent with nerve root irritation but no findings on exam consistent with this (- neural tension tests).  Myotome testing is clear.  She does have some numbness on the R lateral distal thigh, but no other (+) dermatomal testing.  She will benefit from progressive core and hip strengthening and eventually hip hinge progression..    OBJECTIVE IMPAIRMENTS: Pain, lumbar ROM, core strength, hip strength  ACTIVITY LIMITATIONS: bending, lifting, sitting, working  PERSONAL FACTORS: See medical history and pertinent history   REHAB POTENTIAL: Good  CLINICAL DECISION MAKING: Stable/uncomplicated  EVALUATION COMPLEXITY: Low   GOALS:   SHORT TERM GOALS: Target  date: 04/21/2022  Kristin Merritt will be >75% HEP compliant to improve carryover between sessions and facilitate independent management of condition  Evaluation (03/24/2022): ongoing Goal status: INITIAL   LONG TERM GOALS: Target date: 05/19/2022  Kristin Merritt will improve FOTO score to 66 as a proxy for functional improvement  Evaluation/Baseline (03/24/2022): 61 Goal status: INITIAL   2.  Kristin Merritt will self report >/= 50% decrease in pain from evaluation   Evaluation/Baseline (03/24/2022): 9/10 max pain Goal status: INITIAL   3.  Kristin Merritt will be able to maintain plank for 60'' from feet (norm for healthy adult is ~70'' from feet)   Evaluation/Baseline (03/24/2022): 40'' from feet Goal status: INITIAL   4.  Kristin Merritt will be able to maintain side plank from feet for 25'' (norm for healthy adult is 30'' from feet)   Evaluation/Baseline (03/24/2022): L 17'', R 18''  from feet Target date: 05/19/2022 Goal status: INITIAL   5.  Kristin Merritt will be able to maintain supine single leg bridge for 40'' as evidence of improved hip extension and core strength (norm for healthy adult 30''- 60'')   Evaluation/Baseline (03/24/2022): L 30'', R 20'' Goal status: INITIAL   6.  Kristin Merritt will be able to lift 25 lbs from the floor and place on a 3 foot counter, not limited by pain   Evaluation/Baseline (03/24/2022): limited d/t pain Goal status: INITIAL    PLAN: PT FREQUENCY: 1-2x/week  PT DURATION: 8 weeks (Ending 05/19/2022)  PLANNED INTERVENTIONS: Therapeutic exercises, Aquatic therapy, Therapeutic activity, Neuro Muscular re-education, Gait training, Patient/Family education, Joint mobilization, Dry Needling, Electrical stimulation, Spinal mobilization and/or manipulation, Moist heat, Taping, Vasopneumatic device, Ionotophoresis '4mg'$ /ml Dexamethasone, and Manual therapy  PLAN FOR NEXT SESSION: progressive hip and core strengthening eventual D/L progression   Shearon Balo PT, DPT 03/24/2022, 3:42  PM

## 2022-03-31 ENCOUNTER — Ambulatory Visit: Payer: Medicare HMO

## 2022-03-31 DIAGNOSIS — M545 Low back pain, unspecified: Secondary | ICD-10-CM

## 2022-03-31 DIAGNOSIS — M6281 Muscle weakness (generalized): Secondary | ICD-10-CM

## 2022-03-31 NOTE — Therapy (Signed)
OUTPATIENT PHYSICAL THERAPY TREATMENT NOTE   Patient Name: Kristin Merritt MRN: [id number] DOB:[birth year], 61 y.o., female Today's Date: 03/31/2022  PCP: Dorothyann Peng, NP  REFERRING PROVIDER: Dorothyann Peng, NP   END OF SESSION:   PT End of Session - 03/31/22 1438     Visit Number 2    Date for PT Re-Evaluation 05/19/22    Authorization Type [employer] MCR    Progress Note Due on Visit 10    PT Start Time 1440    PT Stop Time 1520    PT Time Calculation (min) 40 min    Activity Tolerance Patient tolerated treatment well    Behavior During Therapy WFL for tasks assessed/performed             Past Medical History:  Diagnosis Date   Allergy    Anemia    as child   Anxiety    Asthma    Constipation    Depression    GERD (gastroesophageal reflux disease)    Heart murmur    High cholesterol    Hypertension    Leg cramps    Lichen sclerosus et atrophicus    Numbness and tingling in hands    Postmenopausal HRT (hormone replacement therapy) - followed by Dr. 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 labia majora   VIN III (vulvar intraepithelial neoplasia III)    Past Surgical History:  Procedure Laterality Date   BREAST EXCISIONAL BIOPSY Left 08/24/2016   times 2   EYE SURGERY     "Lazy Muscle" x 2- one as child and another [place] BIOPSY N/A 11/18/2015   Procedure: WIDE LOCAL EXCISION OF VULVA and MEDIAL LEFT THIGH LESION ;  Surgeon: Terrance Mass, MD;  Location: [facility];  Service: Gynecology;  Laterality: N/A;   WISDOM TOOTH EXTRACTION     Patient Active Problem List   Diagnosis Date Noted   Bilateral carpal tunnel syndrome 07/13/2017   LGSIL Pap smear of vagina 09/08/2016   Vulva cancer ([place]) 04/22/2016   Tobacco abuse 12/01/2015   History of uterine cancer  10/07/2015   VIN III (vulvar intraepithelial neoplasia III) 09/18/2015   Postlaminectomy syndrome, lumbar region 12/17/2014   Right lumbar radiculitis 40/98/1191   Lichen sclerosus 47/82/9562   History of vulvar dysplasia 04/08/2014   Asthma, chronic 08/21/2012   Essential hypertension, benign 08/21/2012   Hyperlipemia 08/21/2012   Anxiety 08/21/2012   Chronic low back pain 08/21/2012   Allergic rhinitis 08/21/2012   Recurrent UTI after sex, uses prophylactic cipro with diflucan for prevention yeast infection 08/21/2012   Symptoms, such as flushing, sleeplessness, headache, lack of concentration, associated with the menopause 07/11/2012   Postmenopausal HRT (hormone replacement therapy) - followed by Dr. Toney Rakes in gyn 07/11/2012    REFERRING DIAG: Lumbar spine pain [M54.50]   THERAPY DIAG:  Chronic low back pain, unspecified back pain laterality, unspecified whether sciatica present  Muscle weakness  Rationale for Evaluation and Treatment Rehabilitation  PERTINENT HISTORY: Laminectomy (~1308)   PRECAUTIONS: None  SUBJECTIVE:  SUBJECTIVE STATEMENT:  Patient reports that she sees her MD this week and wants to get some medication for pain.    PAIN:  Are you having pain? Yes Pain location: R sided low back pain L1 to R glute NPRS scale:  current 7.5/10  average 7/10  Aggravating factors: lifting, bending           NPRS, highest: 9/10 Relieving factors: rest           NPRS: best: 5/10 Pain description: constant, stabbing, and aching Stage: Chronic Stability: staying the same 24 hour pattern: stiff in morning; worse with activity    OBJECTIVE: (objective measures completed at initial evaluation unless otherwise dated)   DIAGNOSTIC FINDINGS:  IMPRESSION: 1. No evidence of acute  abnormality. 2. Multilevel degenerative changes, moderate to severe at L5-S1. 3. Moderate to severe multilevel facet arthropathy, greatest in the mid and LOWER lumbar spine.   SENSATION:          Light touch: Deficits R lateral knee and thigh reduced   MUSCLE LENGTH: Hamstrings: Right no restriction; Left no restriction ASLR: Right ASLR = PSLR; Left ASLR = PSLR Thomas test: Right significant restriction; Left subtle restriction    LUMBAR SPECIAL TESTS:  Straight leg raise: L (-), R (-) Slump: L (-), R (-)   LUMBAR AROM   AROM AROM  03/24/2022  Flexion Fingertips to toes (WNL)  Extension limited by 50%  Right lateral flexion limited by 25%  Left lateral flexion limited by 25%, w/ concordant pain  Right rotation limited by 50%  Left rotation limited by 50%    (Blank rows = not tested)    LE MMT:   MMT Right 03/24/2022 Left 03/24/2022  Hip flexion (L2, L3) 5 5  Knee extension (L3) 5 5  Knee flexion 5 5  Hip abduction 4 4+  Hip extension 4 5  Hip external rotation      Hip internal rotation      Hip adduction      Ankle dorsiflexion (L4) c c  Ankle plantarflexion (S1) c c  Ankle inversion      Ankle eversion      Great Toe ext (L5) c c  Grossly        (Blank rows = not tested, score listed is out of 5 possible points.  N = WNL, D = diminished, C = clear for gross weakness with myotome testing, * = concordant pain with testing)   Functional Tests   Eval (03/24/2022)      30'' STS: 15x  UE used? N      Standard plank from feet: 40'' (norm 70'' from feet)      Sidelying plank from feet: L 17'', R 18'' (norm >30'')        Supine single leg bridge: L 30'', R 20''                                                                                  PATIENT SURVEYS:  FOTO 61 -> 66     TODAY'S TREATMENT  [facility] Adult PT Treatment:  DATE: 03/31/2022 Therapeutic Exercise: Nustep level 5 x 5 mins while gathering  subjective Standing hip abduction/extension RTB at ankles 2x10 Palloff press 7# 2x10 BIL Figure 4 bridge 2x10 BIL Side plank with knees bent 2x20" BIL Plank from feet 2x20" LTR x10 BIL Open books x10 BIL   03/24/2022: Creating, reviewing, and completing below HEP   PATIENT EDUCATION:  POC, diagnosis, prognosis, HEP, and outcome measures.  Pt educated via explanation, demonstration, and handout (HEP).  Pt confirms understanding verbally.    HOME EXERCISE PROGRAM: Access Code: [email or web address] URL: [email or web address] Date: 03/24/2022 Prepared by: Shearon Balo   Exercises - Supine Lower Trunk Rotation  - 1 x daily - 7 x weekly - 1 sets - 20 reps - 3 hold - Figure 4 Bridge  - 1 x daily - 7 x weekly - 3 sets - 10 reps - Standard Plank  - 1 x daily - 7 x weekly - 1 sets - 3 reps - 15 second hold - Side Plank on Elbow  - 1 x daily - 7 x weekly - 1 sets - 3 reps - 10 sec hold Added 03/31/22 - Sidelying Open Book Thoracic Lumbar Rotation and Extension  - 1 x daily - 7 x weekly - 1 sets - 10 reps   ASTERISK SIGNS     Asterisk Signs Eval (03/24/2022)            Hip flexor tightness ++            Side plank L 17'', R 18'' (norm >30'')            plank 40'' (norm 70'' from feet)            Single leg bridge L 30'', R 20''                               ASSESSMENT:   CLINICAL IMPRESSION: Patient presents to Pt with continued high levels of lower back pain and reports HEP non-compliance due to pain and difficulty. Reviewed HEP with patient today and she was agreeable to attempting again. Session today focused on proximal hip and core strengthening as well as lumbar mobility. Patient was able to tolerate all prescribed exercises with no adverse effects. Patient continues to benefit from skilled PT services and should be progressed as able to improve functional independence.      OBJECTIVE IMPAIRMENTS: Pain, lumbar ROM, core strength, hip strength   ACTIVITY LIMITATIONS:  bending, lifting, sitting, working   PERSONAL FACTORS: See medical history and pertinent history     REHAB POTENTIAL: Good   CLINICAL DECISION MAKING: Stable/uncomplicated   EVALUATION COMPLEXITY: Low     GOALS:     SHORT TERM GOALS: Target date: 04/21/2022   Jazmon will be >75% HEP compliant to improve carryover between sessions and facilitate independent management of condition   Evaluation (03/24/2022): ongoing Goal status: INITIAL     LONG TERM GOALS: Target date: 05/19/2022   Timothy will improve FOTO score to 66 as a proxy for functional improvement   Evaluation/Baseline (03/24/2022): 61 Goal status: INITIAL     2.  Shivaun will self report >/= 50% decrease in pain from evaluation    Evaluation/Baseline (03/24/2022): 9/10 max pain Goal status: INITIAL     3.  Shantavia will be able to maintain plank for 60'' from feet (norm for healthy adult is ~70'' from feet)    Evaluation/Baseline (03/24/2022): 40'' from feet Goal status:  INITIAL     4.  Lilas will be able to maintain side plank from feet for 25'' (norm for healthy adult is 30'' from feet)    Evaluation/Baseline (03/24/2022): L 17'', R 18''  from feet Target date: 05/19/2022 Goal status: INITIAL     5.  Lavilla will be able to maintain supine single leg bridge for 40'' as evidence of improved hip extension and core strength (norm for healthy adult 30''- 60'')    Evaluation/Baseline (03/24/2022): L 30'', R 20'' Goal status: INITIAL     6.  Margurette will be able to lift 25 lbs from the floor and place on a 3 foot counter, not limited by pain    Evaluation/Baseline (03/24/2022): limited d/t pain Goal status: INITIAL       PLAN: PT FREQUENCY: 1-2x/week   PT DURATION: 8 weeks (Ending 05/19/2022)   PLANNED INTERVENTIONS: Therapeutic exercises, Aquatic therapy, Therapeutic activity, Neuro Muscular re-education, Gait training, Patient/Family education, Joint mobilization, Dry Needling, Electrical  stimulation, Spinal mobilization and/or manipulation, Moist heat, Taping, Vasopneumatic device, Ionotophoresis '4mg'$ /ml Dexamethasone, and Manual therapy   PLAN FOR NEXT SESSION: progressive hip and core strengthening eventual D/L progression   Margarette Canada, PTA 03/31/2022, 3:20 PM

## 2022-04-02 ENCOUNTER — Ambulatory Visit (INDEPENDENT_AMBULATORY_CARE_PROVIDER_SITE_OTHER): Payer: Medicare HMO | Admitting: Adult Health

## 2022-04-02 ENCOUNTER — Encounter: Payer: Self-pay | Admitting: Adult Health

## 2022-04-02 ENCOUNTER — Other Ambulatory Visit: Payer: Self-pay | Admitting: Adult Health

## 2022-04-02 VITALS — BP 120/88 | HR 85 | Temp 98.0°F | Ht 65.75 in | Wt 211.0 lb

## 2022-04-02 DIAGNOSIS — M545 Low back pain, unspecified: Secondary | ICD-10-CM

## 2022-04-02 DIAGNOSIS — I10 Essential (primary) hypertension: Secondary | ICD-10-CM | POA: Diagnosis not present

## 2022-04-02 DIAGNOSIS — Z0000 Encounter for general adult medical examination without abnormal findings: Secondary | ICD-10-CM

## 2022-04-02 DIAGNOSIS — E669 Obesity, unspecified: Secondary | ICD-10-CM

## 2022-04-02 DIAGNOSIS — E1169 Type 2 diabetes mellitus with other specified complication: Secondary | ICD-10-CM

## 2022-04-02 DIAGNOSIS — Z23 Encounter for immunization: Secondary | ICD-10-CM | POA: Diagnosis not present

## 2022-04-02 DIAGNOSIS — G4730 Sleep apnea, unspecified: Secondary | ICD-10-CM

## 2022-04-02 DIAGNOSIS — J301 Allergic rhinitis due to pollen: Secondary | ICD-10-CM

## 2022-04-02 DIAGNOSIS — G8929 Other chronic pain: Secondary | ICD-10-CM

## 2022-04-02 DIAGNOSIS — E785 Hyperlipidemia, unspecified: Secondary | ICD-10-CM | POA: Diagnosis not present

## 2022-04-02 LAB — COMPREHENSIVE METABOLIC PANEL
ALT: 20 U/L (ref 0–35)
AST: 22 U/L (ref 0–37)
Albumin: 4.3 g/dL (ref 3.5–5.2)
Alkaline Phosphatase: 58 U/L (ref 39–117)
BUN: 15 mg/dL (ref 6–23)
CO2: 30 mEq/L (ref 19–32)
Calcium: 9.2 mg/dL (ref 8.4–10.5)
Chloride: 103 mEq/L (ref 96–112)
Creatinine, Ser: 0.83 mg/dL (ref 0.40–1.20)
GFR: 76.21 mL/min (ref >60.00)
Glucose, Bld: 100 mg/dL — ABNORMAL HIGH (ref 70–99)
Potassium: 3.7 mEq/L (ref 3.5–5.1)
Sodium: 140 mEq/L (ref 135–145)
Total Bilirubin: 0.5 mg/dL (ref 0.2–1.2)
Total Protein: 6.8 g/dL (ref 6.0–8.3)

## 2022-04-02 LAB — LIPID PANEL
Cholesterol: 175 mg/dL (ref 0–200)
HDL: 71.3 mg/dL (ref >39.00)
LDL Cholesterol: 90 mg/dL (ref 0–99)
NonHDL: 103.98
Total CHOL/HDL Ratio: 2
Triglycerides: 72 mg/dL (ref 0.0–149.0)
VLDL: 14.4 mg/dL (ref 0.0–40.0)

## 2022-04-02 LAB — CBC WITH DIFFERENTIAL/PLATELET
Basophils Absolute: 0 10*3/uL (ref 0.0–0.1)
Basophils Relative: 0.3 % (ref 0.0–3.0)
Eosinophils Absolute: 0.1 10*3/uL (ref 0.0–0.7)
Eosinophils Relative: 2.1 % (ref 0.0–5.0)
HCT: 38.3 % (ref 36.0–46.0)
Hemoglobin: 12.1 g/dL (ref 12.0–15.0)
Lymphocytes Relative: 38.2 % (ref 12.0–46.0)
Lymphs Abs: 1.8 10*3/uL (ref 0.7–4.0)
MCHC: 31.7 g/dL (ref 30.0–36.0)
MCV: 79.4 fl (ref 78.0–100.0)
Monocytes Absolute: 0.4 10*3/uL (ref 0.1–1.0)
Monocytes Relative: 7.4 % (ref 3.0–12.0)
Neutro Abs: 2.5 10*3/uL (ref 1.4–7.7)
Neutrophils Relative %: 52 % (ref 43.0–77.0)
Platelets: 163 10*3/uL (ref 150.0–400.0)
RBC: 4.82 Mil/uL (ref 3.87–5.11)
RDW: 15.4 % (ref 11.5–15.5)
WBC: 4.8 10*3/uL (ref 4.0–10.5)

## 2022-04-02 LAB — TSH: TSH: 0.63 u[IU]/mL (ref 0.35–5.50)

## 2022-04-02 LAB — HEMOGLOBIN A1C: Hgb A1c MFr Bld: 6.4 % (ref 4.6–6.5)

## 2022-04-02 MED ORDER — CYCLOBENZAPRINE HCL 10 MG PO TABS: 10.0000 mg | ORAL_TABLET | Freq: Three times a day (TID) | ORAL | 0 refills | Status: DC | PRN

## 2022-04-02 NOTE — Addendum Note (Signed)
Addended by: Rosalyn Gess D on: 04/02/2022 04:38 PM   Modules accepted: Orders

## 2022-04-02 NOTE — Patient Instructions (Signed)
It was great seeing you today   We will follow up with you regarding your lab work   Please let me know if you need anything   Someone will call you for your sleep study and MRI   I have sent in a muscle relaxer called Flexeril to see if this helps with the back pain

## 2022-04-02 NOTE — Progress Notes (Signed)
Subjective:    Patient ID: Kristin Merritt, female    DOB: 1960-12-22, 61 y.o.   MRN: [id number]  HPI  Patient presents for yearly preventative medicine examination. She is a 61 year old female who  has a past medical history of Allergy, Anemia, Anxiety, Asthma, Constipation, Depression, GERD (gastroesophageal reflux disease), Heart murmur, High cholesterol, Hypertension, Leg cramps, Lichen sclerosus et atrophicus, Numbness and tingling in hands, Postmenopausal HRT (hormone replacement therapy) - followed by Dr. Toney Rakes in gyn (07/11/2012), Swelling of both hands, Uterine cancer ([place]) (1991), UTI (urinary tract infection), VIN III (vulvar intraepithelial neoplasia III), and VIN III (vulvar intraepithelial neoplasia III).  Hyperlipidemia-currently managed with Lipitor 20 mg daily.  She denies myalgia or fatigue Lab Results  Component Value Date   CHOL 174 04/07/2021   HDL 73.60 04/07/2021   LDLCALC 86 04/07/2021   TRIG 70.0 04/07/2021   CHOLHDL 2 04/07/2021   Hypertension -managed with losartan/hydrochlorothiazide 100-25 mg daily.  She also is supplementing with 20 mill of the components of Gatorade to prevent hypokalemia.  She denies dizziness, lightheadedness, chest pain, shortness of breath BP Readings from Last 3 Encounters:  04/02/22 120/88  02/24/22 108/78  02/12/22 130/80   Seasonal allergies/asthma-managed by allergy and asthma clinic.  She is currently prescribed Singulair, Flonase, Advair 250-500 mg.  Diabetes Mellitus -managed with metformin 500 mg twice daily.  She does not check her blood sugars at home. Reports that she has been eating a lot of sugar  Lab Results  Component Value Date   HGBA1C 6.6 (H) 04/07/2021   Low Back Pain -currently going to physical therapy but continues to have pretty significant lumbar spine pain.  Her x-ray done in September 2022 showed moderate to severe multilevel degenerative changes at L5/S1 and moderate to severe multilevel facet  arthropathy greatest in the mid and lower lumbar spine.   Concern for sleep apnea - she reports that she does snore " like a train" and she also reports waking up in the morning not feeling well rested. Denies apneic episodes.   All immunizations and health maintenance protocols were reviewed with the patient and needed orders were placed.  Appropriate screening laboratory values were ordered for the patient including screening of hyperlipidemia, renal function and hepatic function.  Medication reconciliation,  past medical history, social history, problem list and allergies were reviewed in detail with the patient  Goals were established with regard to weight loss, exercise, and  diet in compliance with medications Wt Readings from Last 3 Encounters:  04/02/22 211 lb (95.7 kg)  02/24/22 209 lb 3.2 oz (94.9 kg)  02/12/22 208 lb (94.3 kg)   She is up to date on routine colon cancer screening. She is going to call and schedule her mammogram   Review of Systems  Constitutional: Negative.   HENT: Negative.    Eyes: Negative.   Respiratory: Negative.    Cardiovascular: Negative.   Gastrointestinal: Negative.   Endocrine: Negative.   Genitourinary: Negative.   Musculoskeletal:  Positive for arthralgias and back pain.  Skin: Negative.   Allergic/Immunologic: Negative.   Neurological: Negative.   Hematological: Negative.   Psychiatric/Behavioral: Negative.     Past Medical History:  Diagnosis Date   Allergy    Anemia    as child   Anxiety    Asthma    Constipation    Depression    GERD (gastroesophageal reflux disease)    Heart murmur    High cholesterol    Hypertension  Leg cramps    Lichen sclerosus et atrophicus    Numbness and tingling in hands    Postmenopausal HRT (hormone replacement therapy) - followed by Dr. Toney Rakes in gyn 07/11/2012   Swelling of both hands    Uterine cancer ([place]) 1991   UTI (urinary tract infection)    VIN III (vulvar intraepithelial  neoplasia III)    left labia majora   VIN III (vulvar intraepithelial neoplasia III)     Social History   Socioeconomic History   Marital status: Widowed    Spouse name: Not on file   Number of children: Not on file   Years of education: Not on file   Highest education level: 12th grade  Occupational History   Not on file  Tobacco Use   Smoking status: Former    Packs/day: 0.25    Years: 20.00    Total pack years: 5.00    Types: Cigarettes    Quit date: 02/21/2017    Years since quitting: 5.1   Smokeless tobacco: Never  Vaping Use   Vaping Use: Never used  Substance and Sexual Activity   Alcohol use: Not Currently   Drug use: No   Sexual activity: Not Currently    Partners: Male    Birth control/protection: Surgical    Comment: 1st intercourse- 17, partners- 53, hysterectomy  Other Topics Concern   Not on file  Social History Narrative   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 Enjoys walking   Diet: Tries to watch what she eats. Does not eat a lot of processed foods or fast food.    Social Determinants of Health   Financial Resource Strain: Low Risk  (02/19/2022)   Overall Financial Resource Strain (CARDIA)    Difficulty of Paying Living Expenses: Not hard at all  Food Insecurity: No Food Insecurity (02/19/2022)   Hunger Vital Sign    Worried About Running Out of Food in the Last Year: Never true    Ran Out of Food in the Last Year: Never true  Transportation Needs: No Transportation Needs (02/19/2022)   PRAPARE - Hydrologist (Medical): No    Lack of Transportation (Non-Medical): No  Physical Activity: Sufficiently Active (02/19/2022)   Exercise Vital Sign    Days of Exercise per Week: 5 days    Minutes of Exercise per Session: 30 min  Stress: No Stress Concern Present (02/19/2022)   [place]    Feeling of Stress : Not at all  Social Connections: Unknown (02/19/2022)   Social Connection and Isolation Panel [NHANES]    Frequency of Communication with Friends and Family: More than three times a week    Frequency of Social Gatherings with Friends and Family: Once a week    Attends Religious Services: Patient refused    Active Member of Clubs or Organizations: Yes    Attends Archivist Meetings: Patient refused    Marital Status: Widowed  Intimate Partner Violence: Not At Risk (05/01/2020)   Humiliation, Afraid, Rape, and Kick questionnaire    Fear of Current or Ex-Partner: No    Emotionally Abused: No    Physically Abused: No    Sexually Abused: No    Past Surgical History:  Procedure Laterality Date  BREAST EXCISIONAL BIOPSY Left 08/24/2016   times 2   EYE SURGERY     "Lazy Muscle" x 2- one as child and another [place] BIOPSY N/A 11/18/2015   Procedure: WIDE LOCAL EXCISION OF VULVA and MEDIAL LEFT THIGH LESION ;  Surgeon: Terrance Mass, MD;  Location: [facility];  Service: Gynecology;  Laterality: N/A;   WISDOM TOOTH EXTRACTION      Family History  Problem Relation Age of Onset   Cancer Mother        LUNG    Breast cancer Mother        Died   Esophageal cancer Mother    Colon cancer Neg Hx    Colon polyps Neg Hx    Rectal cancer Neg Hx    Stomach cancer Neg Hx     Allergies  Allergen Reactions   Percocet [Oxycodone-Acetaminophen] Itching    Current Outpatient Medications on File Prior to Visit  Medication Sig Dispense Refill   Albuterol Sulfate (PROAIR HFA IN) Inhale into the lungs. 1-2 puffs as needed every 4-6 hours for cough and wheezing     atorvastatin (LIPITOR) 20 MG tablet TAKE 1 TABLET BY MOUTH EVERY DAY 90 tablet 0   azelastine (OPTIVAR) 0.05 % ophthalmic solution Apply to eye.     Azelastine-Fluticasone 137-50 MCG/ACT SUSP 1 puff in  each nostril     docusate sodium (COLACE) 100 MG capsule      EPIPEN 2-PAK 0.3 MG/0.3ML SOAJ injection      FIBER ADULT GUMMIES PO Take by mouth.     ibuprofen (ADVIL) 800 MG tablet Take 1 tablet (800 mg total) by mouth 3 (three) times daily as needed. 30 tablet 2   KLOR-CON M10 10 MEQ tablet TAKE 2 TABLETS BY MOUTH DAILY 180 tablet 1   lansoprazole (PREVACID) 30 MG capsule TAKE 1 CAPSULE BY MOUTH ONCE DAILY ONCE A DAY 90 capsule 3   levocetirizine (XYZAL) 5 MG tablet Take 5 mg by mouth every evening.      losartan-hydrochlorothiazide (HYZAAR) 100-25 MG tablet TAKE 1 TABLET BY MOUTH EVERY DAY 90 tablet 3   metFORMIN (GLUCOPHAGE) 500 MG tablet TAKE 1 TABLET BY MOUTH TWICE A DAY WITH FOOD 180 tablet 0   montelukast (SINGULAIR) 10 MG tablet Take 10 mg by mouth at bedtime.     potassium chloride SA (KLOR-CON M20) 20 MEQ tablet TAKE 1 TABLET BY MOUTH EVERY DAY 90 tablet 0   VITAMIN D PO Take 400 Int'l Units by mouth.     No current facility-administered medications on file prior to visit.    BP 120/88   Pulse 85   Temp 98 F (36.7 C) (Oral)   Ht 5' 5.75" (1.67 m)   Wt 211 lb (95.7 kg)   SpO2 97%   BMI 34.32 kg/m       Objective:   Physical Exam Vitals and nursing note reviewed.  Constitutional:      General: She is not in acute distress.    Appearance: Normal appearance. She is well-developed. She is not ill-appearing.  HENT:     Head: Normocephalic and atraumatic.     Right Ear: Tympanic membrane, ear canal and external ear normal. There is no impacted cerumen.     Left Ear: Tympanic membrane, ear canal and external ear normal. There is no impacted cerumen.     Nose: Nose normal. No congestion  or rhinorrhea.     Mouth/Throat:     Mouth: Mucous membranes are moist.     Pharynx: Oropharynx is clear. No oropharyngeal exudate or posterior oropharyngeal erythema.  Eyes:     General:        Right eye: No discharge.        Left eye: No discharge.     Extraocular Movements:  Extraocular movements intact.     Conjunctiva/sclera: Conjunctivae normal.     Pupils: Pupils are equal, round, and reactive to light.  Neck:     Thyroid: No thyromegaly.     Vascular: No carotid bruit.     Trachea: No tracheal deviation.  Cardiovascular:     Rate and Rhythm: Normal rate and regular rhythm.     Pulses: Normal pulses.     Heart sounds: Normal heart sounds. No murmur heard.    No friction rub. No gallop.  Pulmonary:     Effort: Pulmonary effort is normal. No respiratory distress.     Breath sounds: Normal breath sounds. No stridor. No wheezing, rhonchi or rales.  Chest:     Chest wall: No tenderness.  Abdominal:     General: Abdomen is flat. Bowel sounds are normal. There is no distension.     Palpations: Abdomen is soft. There is no mass.     Tenderness: There is no abdominal tenderness. There is no right CVA tenderness, left CVA tenderness, guarding or rebound.     Hernia: No hernia is present.  Musculoskeletal:        General: No swelling, tenderness, deformity or signs of injury. Normal range of motion.     Cervical back: Normal range of motion and neck supple.     Right lower leg: No edema.     Left lower leg: No edema.  Lymphadenopathy:     Cervical: No cervical adenopathy.  Skin:    General: Skin is warm and dry.     Coloration: Skin is not jaundiced or pale.     Findings: No bruising, erythema, lesion or rash.  Neurological:     General: No focal deficit present.     Mental Status: She is alert and oriented to person, place, and time.     Cranial Nerves: No cranial nerve deficit.     Sensory: No sensory deficit.     Motor: No weakness.     Coordination: Coordination normal.     Gait: Gait normal.     Deep Tendon Reflexes: Reflexes normal.  Psychiatric:        Mood and Affect: Mood normal.        Behavior: Behavior normal.        Thought Content: Thought content normal.        Judgment: Judgment normal.       Assessment & Plan:  1. Routine  general medical examination at a health care facility - Needs to work on weight loss through lifestyle modifications  - Follow up in one year or sooner if needed - CBC with Differential/Platelet; Future - Comprehensive metabolic panel; Future - Hemoglobin A1c; Future - Lipid panel; Future - TSH; Future - Microalbumin/Creatinine Ratio, Urine; Future  2. Essential hypertension, benign - Well controlled. No change in medications  - CBC with Differential/Platelet; Future - Comprehensive metabolic panel; Future - Hemoglobin A1c; Future - Lipid panel; Future - TSH; Future - Microalbumin/Creatinine Ratio, Urine; Future  3. Hyperlipidemia, unspecified hyperlipidemia type - Consider increase in statin - CBC with Differential/Platelet; Future - Comprehensive metabolic  panel; Future - Hemoglobin A1c; Future - Lipid panel; Future - TSH; Future - Microalbumin/Creatinine Ratio, Urine; Future  4. Diabetes mellitus type 2 in obese ([place]) - Consider increase In metformin or adding another agent  - CBC with Differential/Platelet; Future - Comprehensive metabolic panel; Future - Hemoglobin A1c; Future - Lipid panel; Future - TSH; Future - Microalbumin/Creatinine Ratio, Urine; Future  5. Seasonal allergic rhinitis due to pollen - Per allergy and asthma   6. Chronic bilateral low back pain without sciatica - Will trial her on Flexeril  - Will need MRI of low back  - cyclobenzaprine (FLEXERIL) 10 MG tablet; Take 1 tablet (10 mg total) by mouth 3 (three) times daily as needed for muscle spasms.  Dispense: 30 tablet; Refill: 0 - MR Lumbar Spine Wo Contrast; Future  7. Sleep apnea, unspecified type  - Ambulatory referral to Pulmonology  8. Need for immunization against influenza  - Flu Vaccine QUAD 22moIM (Fluarix, Fluzone & Alfiuria Quad PF)  CDorothyann Peng NP

## 2022-04-03 LAB — MICROALBUMIN / CREATININE URINE RATIO
Creatinine, Urine: 161 mg/dL (ref 20–275)
Microalb Creat Ratio: 3 mcg/mg creat (ref <30)
Microalb, Ur: 0.5 mg/dL

## 2022-04-07 ENCOUNTER — Ambulatory Visit: Payer: Medicare HMO

## 2022-04-07 DIAGNOSIS — M545 Low back pain, unspecified: Secondary | ICD-10-CM | POA: Diagnosis not present

## 2022-04-07 DIAGNOSIS — M6281 Muscle weakness (generalized): Secondary | ICD-10-CM

## 2022-04-07 NOTE — Therapy (Signed)
OUTPATIENT PHYSICAL THERAPY TREATMENT NOTE   Patient Name: Kristin Merritt MRN: [id number] DOB:24-Oct-1960, 61 y.o., female Today's Date: 04/07/2022  PCP: Dorothyann Peng, NP  REFERRING PROVIDER: Dorothyann Peng, NP   END OF SESSION:   PT End of Session - 04/07/22 1431     Visit Number 3    Date for PT Re-Evaluation 05/19/22    Authorization Type [employer] MCR    Progress Note Due on Visit 10    PT Start Time 1440    PT Stop Time 1520    PT Time Calculation (min) 40 min    Activity Tolerance Patient tolerated treatment well    Behavior During Therapy WFL for tasks assessed/performed              Past Medical History:  Diagnosis Date   Allergy    Anemia    as child   Anxiety    Asthma    Constipation    Depression    GERD (gastroesophageal reflux disease)    Heart murmur    High cholesterol    Hypertension    Leg cramps    Lichen sclerosus et atrophicus    Numbness and tingling in hands    Postmenopausal HRT (hormone replacement therapy) - followed by Dr. Toney Rakes in gyn 07/11/2012   Swelling of both hands    Uterine cancer ([place]) 1991   UTI (urinary tract infection)    VIN III (vulvar intraepithelial neoplasia III)    left labia majora   VIN III (vulvar intraepithelial neoplasia III)    Past Surgical History:  Procedure Laterality Date   BREAST EXCISIONAL BIOPSY Left 08/24/2016   times 2   EYE SURGERY     "Lazy Muscle" x 2- one as child and another [place] BIOPSY N/A 11/18/2015   Procedure: WIDE LOCAL EXCISION OF VULVA and MEDIAL LEFT THIGH LESION ;  Surgeon: Terrance Mass, MD;  Location: [facility];  Service: Gynecology;  Laterality: N/A;   WISDOM TOOTH EXTRACTION     Patient Active Problem List   Diagnosis Date Noted   Bilateral carpal tunnel syndrome 07/13/2017   LGSIL Pap smear of vagina 09/08/2016   Vulva cancer ([place]) 04/22/2016   Tobacco abuse 12/01/2015   History of uterine cancer  10/07/2015   VIN III (vulvar intraepithelial neoplasia III) 09/18/2015   Postlaminectomy syndrome, lumbar region 12/17/2014   Right lumbar radiculitis 38/93/7342   Lichen sclerosus 87/68/1157   History of vulvar dysplasia 04/08/2014   Asthma, chronic 08/21/2012   Essential hypertension, benign 08/21/2012   Hyperlipemia 08/21/2012   Anxiety 08/21/2012   Chronic low back pain 08/21/2012   Allergic rhinitis 08/21/2012   Recurrent UTI after sex, uses prophylactic cipro with diflucan for prevention yeast infection 08/21/2012   Symptoms, such as flushing, sleeplessness, headache, lack of concentration, associated with the menopause 07/11/2012   Postmenopausal HRT (hormone replacement therapy) - followed by Dr. Toney Rakes in gyn 07/11/2012    REFERRING DIAG: Lumbar spine pain [M54.50]   THERAPY DIAG:  Chronic low back pain, unspecified back pain laterality, unspecified whether sciatica present  Muscle weakness  Rationale for Evaluation and Treatment Rehabilitation  PERTINENT HISTORY: Laminectomy (~2620)   PRECAUTIONS: None  SUBJECTIVE:  SUBJECTIVE STATEMENT:  Patient reports that she attempted some of her HEP but that the planks bother her Lt shoulder.   PAIN:  Are you having pain? Yes Pain location: R sided low back pain L1 to R glute NPRS scale:  current 7/10  average 7/10  Aggravating factors: lifting, bending           NPRS, highest: 9/10 Relieving factors: rest           NPRS: best: 5/10 Pain description: constant, stabbing, and aching Stage: Chronic Stability: staying the same 24 hour pattern: stiff in morning; worse with activity    OBJECTIVE: (objective measures completed at initial evaluation unless otherwise dated)   DIAGNOSTIC FINDINGS:  IMPRESSION: 1. No evidence of acute  abnormality. 2. Multilevel degenerative changes, moderate to severe at L5-S1. 3. Moderate to severe multilevel facet arthropathy, greatest in the mid and LOWER lumbar spine.   SENSATION:          Light touch: Deficits R lateral knee and thigh reduced   MUSCLE LENGTH: Hamstrings: Right no restriction; Left no restriction ASLR: Right ASLR = PSLR; Left ASLR = PSLR Thomas test: Right significant restriction; Left subtle restriction    LUMBAR SPECIAL TESTS:  Straight leg raise: L (-), R (-) Slump: L (-), R (-)   LUMBAR AROM   AROM AROM  03/24/2022  Flexion Fingertips to toes (WNL)  Extension limited by 50%  Right lateral flexion limited by 25%  Left lateral flexion limited by 25%, w/ concordant pain  Right rotation limited by 50%  Left rotation limited by 50%    (Blank rows = not tested)    LE MMT:   MMT Right 03/24/2022 Left 03/24/2022  Hip flexion (L2, L3) 5 5  Knee extension (L3) 5 5  Knee flexion 5 5  Hip abduction 4 4+  Hip extension 4 5  Hip external rotation      Hip internal rotation      Hip adduction      Ankle dorsiflexion (L4) c c  Ankle plantarflexion (S1) c c  Ankle inversion      Ankle eversion      Great Toe ext (L5) c c  Grossly        (Blank rows = not tested, score listed is out of 5 possible points.  N = WNL, D = diminished, C = clear for gross weakness with myotome testing, * = concordant pain with testing)   Functional Tests   Eval (03/24/2022)      30'' STS: 15x  UE used? N      Standard plank from feet: 40'' (norm 70'' from feet)      Sidelying plank from feet: L 17'', R 18'' (norm >30'')        Supine single leg bridge: L 30'', R 20''                                                                                  PATIENT SURVEYS:  FOTO 61 -> 66     TODAY'S TREATMENT  [facility] Adult PT Treatment:  DATE: 04/07/2022 Therapeutic Exercise: Nustep level 5 x 5 mins while gathering  subjective Standing hip abduction/extension RTB at ankles 2x10 Palloff press 7# 2x10 BIL Standing abdominal press down red pball 3" hold 2x10 Figure 4 bridge 2x10 BIL Supine figure 4 piriformis stretch x1' BIL Modified thomas stretch x1' BIL Supine butterfly stretch x1' LTR x10 BIL Deadlift with 10# KB from 8" step (cues for form - hip hinge, knee bend, not rounding thoracic spine) 2x10  [facility] Adult PT Treatment:                                                DATE: 03/31/2022 Therapeutic Exercise: Nustep level 5 x 5 mins while gathering subjective Standing hip abduction/extension RTB at ankles 2x10 Palloff press 7# 2x10 BIL Figure 4 bridge 2x10 BIL Side plank with knees bent 2x20" BIL Plank from feet 2x20" LTR x10 BIL Open books x10 BIL   03/24/2022: Creating, reviewing, and completing below HEP   PATIENT EDUCATION:  POC, diagnosis, prognosis, HEP, and outcome measures.  Pt educated via explanation, demonstration, and handout (HEP).  Pt confirms understanding verbally.    HOME EXERCISE PROGRAM: Access Code: [email or web address] URL: [email or web address] Date: 03/24/2022 Prepared by: Shearon Balo   Exercises - Supine Lower Trunk Rotation  - 1 x daily - 7 x weekly - 1 sets - 20 reps - 3 hold - Figure 4 Bridge  - 1 x daily - 7 x weekly - 3 sets - 10 reps - Standard Plank  - 1 x daily - 7 x weekly - 1 sets - 3 reps - 15 second hold - Side Plank on Elbow  - 1 x daily - 7 x weekly - 1 sets - 3 reps - 10 sec hold Added 03/31/22 - Sidelying Open Book Thoracic Lumbar Rotation and Extension  - 1 x daily - 7 x weekly - 1 sets - 10 reps   ASTERISK SIGNS     Asterisk Signs Eval (03/24/2022)            Hip flexor tightness ++            Side plank L 17'', R 18'' (norm >30'')            plank 40'' (norm 70'' from feet)            Single leg bridge L 30'', R 20''                               ASSESSMENT:   CLINICAL IMPRESSION: Patient presents to PT with continued reports of  pain in her lower back and states she tried her HEP but that the planks bother her Lt shoulder. Pressed to deadlifts from height today with patient needing minimal cues to maintain form. Patient was able to tolerate all prescribed exercises with no adverse effects. Patient continues to benefit from skilled PT services and should be progressed as able to improve functional independence.    OBJECTIVE IMPAIRMENTS: Pain, lumbar ROM, core strength, hip strength   ACTIVITY LIMITATIONS: bending, lifting, sitting, working   PERSONAL FACTORS: See medical history and pertinent history     REHAB POTENTIAL: Good   CLINICAL DECISION MAKING: Stable/uncomplicated   EVALUATION COMPLEXITY: Low     GOALS:     SHORT TERM GOALS: Target date: 04/21/2022  Marriah will be >75% HEP compliant to improve carryover between sessions and facilitate independent management of condition   Evaluation (03/24/2022): ongoing Goal status: INITIAL     LONG TERM GOALS: Target date: 05/19/2022   Honi will improve FOTO score to 66 as a proxy for functional improvement   Evaluation/Baseline (03/24/2022): 61 Goal status: INITIAL     2.  Brilynn will self report >/= 50% decrease in pain from evaluation    Evaluation/Baseline (03/24/2022): 9/10 max pain Goal status: INITIAL     3.  Bronwen will be able to maintain plank for 60'' from feet (norm for healthy adult is ~70'' from feet)    Evaluation/Baseline (03/24/2022): 40'' from feet Goal status: INITIAL     4.  Milayah will be able to maintain side plank from feet for 25'' (norm for healthy adult is 30'' from feet)    Evaluation/Baseline (03/24/2022): L 17'', R 18''  from feet Target date: 05/19/2022 Goal status: INITIAL     5.  Jamirah will be able to maintain supine single leg bridge for 40'' as evidence of improved hip extension and core strength (norm for healthy adult 30''- 60'')    Evaluation/Baseline (03/24/2022): L 30'', R 20'' Goal status:  INITIAL     6.  Delphia will be able to lift 25 lbs from the floor and place on a 3 foot counter, not limited by pain    Evaluation/Baseline (03/24/2022): limited d/t pain Goal status: INITIAL       PLAN: PT FREQUENCY: 1-2x/week   PT DURATION: 8 weeks (Ending 05/19/2022)   PLANNED INTERVENTIONS: Therapeutic exercises, Aquatic therapy, Therapeutic activity, Neuro Muscular re-education, Gait training, Patient/Family education, Joint mobilization, Dry Needling, Electrical stimulation, Spinal mobilization and/or manipulation, Moist heat, Taping, Vasopneumatic device, Ionotophoresis '4mg'$ /ml Dexamethasone, and Manual therapy   PLAN FOR NEXT SESSION: progressive hip and core strengthening eventual D/L progression   Margarette Canada, PTA 04/07/2022, 3:21 PM

## 2022-04-09 ENCOUNTER — Ambulatory Visit: Payer: Medicare HMO | Admitting: Adult Health

## 2022-04-09 VITALS — BP 130/90 | HR 94 | Temp 98.4°F | Ht 65.75 in | Wt 211.0 lb

## 2022-04-09 DIAGNOSIS — J302 Other seasonal allergic rhinitis: Secondary | ICD-10-CM

## 2022-04-09 DIAGNOSIS — E1169 Type 2 diabetes mellitus with other specified complication: Secondary | ICD-10-CM | POA: Diagnosis not present

## 2022-04-09 DIAGNOSIS — E669 Obesity, unspecified: Secondary | ICD-10-CM | POA: Diagnosis not present

## 2022-04-09 MED ORDER — TIRZEPATIDE 2.5 MG/0.5ML ~~LOC~~ SOAJ: 2.5000 mg | SUBCUTANEOUS | 0 refills | Status: DC

## 2022-04-09 NOTE — Progress Notes (Signed)
Subjective:    Patient ID: Kristin Merritt, female    DOB: 1961/03/01, 61 y.o.   MRN: [id number]  HPI 61 year old female who  has a past medical history of Allergy, Anemia, Anxiety, Asthma, Constipation, Depression, GERD (gastroesophageal reflux disease), Heart murmur, High cholesterol, Hypertension, Leg cramps, Lichen sclerosus et atrophicus, Numbness and tingling in hands, Postmenopausal HRT (hormone replacement therapy) - followed by Dr. Toney Rakes in gyn (07/11/2012), Swelling of both hands, Uterine cancer ([place]) (1991), UTI (urinary tract infection), VIN III (vulvar intraepithelial neoplasia III), and VIN III (vulvar intraepithelial neoplasia III).  She presents to the office today for seasonal allergy-like symptoms.  She still has not made an appointment with her allergist and is still not taking her Xyzal or Singulair due to the sedating effects.  She reports itchy watery eyes and upper eyelids.  She has been using her Flonase and eyedrops without improvement.   Additionally, she called her insurance company about medications for diabetes mellitus type 2.  She found out that [facility] would be covered 100%.  She is currently on metformin.  Lab Results  Component Value Date   HGBA1C 6.4 04/02/2022      Review of Systems See HPI   Past Medical History:  Diagnosis Date   Allergy    Anemia    as child   Anxiety    Asthma    Constipation    Depression    GERD (gastroesophageal reflux disease)    Heart murmur    High cholesterol    Hypertension    Leg cramps    Lichen sclerosus et atrophicus    Numbness and tingling in hands    Postmenopausal HRT (hormone replacement therapy) - followed by Dr. Toney Rakes in gyn 07/11/2012   Swelling of both hands    Uterine cancer ([place]) 1991   UTI (urinary tract infection)    VIN III (vulvar intraepithelial neoplasia III)    left labia majora   VIN III (vulvar intraepithelial neoplasia III)     Social History   Socioeconomic History    Marital status: Widowed    Spouse name: Not on file   Number of children: Not on file   Years of education: Not on file   Highest education level: 12th grade  Occupational History   Not on file  Tobacco Use   Smoking status: Former    Packs/day: 0.25    Years: 20.00    Total pack years: 5.00    Types: Cigarettes    Quit date: 02/21/2017    Years since quitting: 5.1   Smokeless tobacco: Never  Vaping Use   Vaping Use: Never used  Substance and Sexual Activity   Alcohol use: Not Currently   Drug use: No   Sexual activity: Not Currently    Partners: Male    Birth control/protection: Surgical    Comment: 1st intercourse- 17, partners- 54, hysterectomy  Other Topics Concern   Not on file  Social History Narrative   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 Enjoys walking   Diet: Tries to watch what she eats. Does not eat a lot of processed foods or fast food.    Social Determinants of Health   Financial Resource Strain: Low Risk  (02/19/2022)   Overall Financial Resource Strain (CARDIA)  Difficulty of Paying Living Expenses: Not hard at all  Food Insecurity: No Food Insecurity (02/19/2022)   Hunger Vital Sign    Worried About Running Out of Food in the Last Year: Never true    Ran Out of Food in the Last Year: Never true  Transportation Needs: No Transportation Needs (02/19/2022)   PRAPARE - Hydrologist (Medical): No    Lack of Transportation (Non-Medical): No  Physical Activity: Sufficiently Active (02/19/2022)   Exercise Vital Sign    Days of Exercise per Week: 5 days    Minutes of Exercise per Session: 30 min  Stress: No Stress Concern Present (02/19/2022)   [place]    Feeling of Stress : Not at all  Social Connections: Unknown (02/19/2022)   Social Connection and  Isolation Panel [NHANES]    Frequency of Communication with Friends and Family: More than three times a week    Frequency of Social Gatherings with Friends and Family: Once a week    Attends Religious Services: Patient refused    Active Member of Clubs or Organizations: Yes    Attends Archivist Meetings: Patient refused    Marital Status: Widowed  Intimate Partner Violence: Not At Risk (05/01/2020)   Humiliation, Afraid, Rape, and Kick questionnaire    Fear of Current or Ex-Partner: No    Emotionally Abused: No    Physically Abused: No    Sexually Abused: No    Past Surgical History:  Procedure Laterality Date   BREAST EXCISIONAL BIOPSY Left 08/24/2016   times 2   EYE SURGERY     "Lazy Muscle" x 2- one as child and another [place] BIOPSY N/A 11/18/2015   Procedure: WIDE LOCAL EXCISION OF VULVA and MEDIAL LEFT THIGH LESION ;  Surgeon: Terrance Mass, MD;  Location: [facility];  Service: Gynecology;  Laterality: N/A;   WISDOM TOOTH EXTRACTION      Family History  Problem Relation Age of Onset   Cancer Mother        LUNG    Breast cancer Mother        Died   Esophageal cancer Mother    Colon cancer Neg Hx    Colon polyps Neg Hx    Rectal cancer Neg Hx    Stomach cancer Neg Hx     Allergies  Allergen Reactions   Percocet [Oxycodone-Acetaminophen] Itching    Current Outpatient Medications on File Prior to Visit  Medication Sig Dispense Refill   Albuterol Sulfate (PROAIR HFA IN) Inhale into the lungs. 1-2 puffs as needed every 4-6 hours for cough and wheezing     atorvastatin (LIPITOR) 20 MG tablet TAKE 1 TABLET BY MOUTH EVERY DAY 90 tablet 0   azelastine (OPTIVAR) 0.05 % ophthalmic solution Apply to eye.     Azelastine-Fluticasone 137-50 MCG/ACT SUSP 1 puff in each nostril     cyclobenzaprine (FLEXERIL) 10 MG tablet Take 1 tablet (10 mg total) by mouth 3 (three) times daily as needed for muscle  spasms. 30 tablet 0   docusate sodium (COLACE) 100 MG capsule      EPIPEN 2-PAK 0.3 MG/0.3ML SOAJ injection      FIBER ADULT GUMMIES PO Take by mouth.     ibuprofen (ADVIL) 800 MG tablet Take 1 tablet (800 mg total) by mouth 3 (three) times  daily as needed. 30 tablet 2   KLOR-CON M10 10 MEQ tablet TAKE 2 TABLETS BY MOUTH DAILY 180 tablet 1   lansoprazole (PREVACID) 30 MG capsule TAKE 1 CAPSULE BY MOUTH ONCE DAILY ONCE A DAY 90 capsule 3   levocetirizine (XYZAL) 5 MG tablet Take 5 mg by mouth every evening.      losartan-hydrochlorothiazide (HYZAAR) 100-25 MG tablet TAKE 1 TABLET BY MOUTH EVERY DAY 90 tablet 3   metFORMIN (GLUCOPHAGE) 500 MG tablet TAKE 1 TABLET BY MOUTH TWICE A DAY WITH FOOD 180 tablet 0   montelukast (SINGULAIR) 10 MG tablet Take 10 mg by mouth at bedtime.     potassium chloride SA (KLOR-CON M20) 20 MEQ tablet TAKE 1 TABLET BY MOUTH EVERY DAY 90 tablet 0   VITAMIN D PO Take 400 Int'l Units by mouth.     No current facility-administered medications on file prior to visit.    BP (!) 130/90   Pulse 94   Temp 98.4 F (36.9 C) (Oral)   Ht 5' 5.75" (1.67 m)   Wt 211 lb (95.7 kg)   SpO2 100%   BMI 34.32 kg/m       Objective:   Physical Exam Vitals and nursing note reviewed.  Constitutional:      Appearance: Normal appearance.  HENT:     Right Ear: Tympanic membrane, ear canal and external ear normal.     Left Ear: Tympanic membrane, ear canal and external ear normal.  Eyes:     General: Lids are normal.        Right eye: No discharge.        Left eye: No discharge.     Extraocular Movements: Extraocular movements intact.     Conjunctiva/sclera: Conjunctivae normal.     Pupils: Pupils are equal, round, and reactive to light.  Skin:    General: Skin is warm and dry.     Capillary Refill: Capillary refill takes less than 2 seconds.  Neurological:     General: No focal deficit present.     Mental Status: She is alert and oriented to person, place, and time.        Assessment & Plan:  1. Diabetes mellitus type 2 in obese (HCC) - tirzepatide (MOUNJARO) 2.5 MG/0.5ML Pen; Inject 2.5 mg into the skin once a week.  Dispense: 2 mL; Refill: 0 - D/c Metformin  - Follow up in one month   2. Seasonal allergies - encouraged to take Singulair at night   Dorothyann Peng, NP

## 2022-04-12 ENCOUNTER — Ambulatory Visit: Payer: Medicare HMO

## 2022-04-12 DIAGNOSIS — M6281 Muscle weakness (generalized): Secondary | ICD-10-CM

## 2022-04-12 DIAGNOSIS — M545 Low back pain, unspecified: Secondary | ICD-10-CM

## 2022-04-12 NOTE — Therapy (Signed)
OUTPATIENT PHYSICAL THERAPY TREATMENT NOTE   Patient Name: Kristin Merritt MRN: [id number] DOB:02/20/1961, 61 y.o., female Today's Date: 04/12/2022  PCP: Dorothyann Peng, NP  REFERRING PROVIDER: Dorothyann Peng, NP   END OF SESSION:   PT End of Session - 04/12/22 1535     Visit Number 4    Date for PT Re-Evaluation 05/19/22    Authorization Type [employer] MCR    Progress Note Due on Visit 10    PT Start Time 5170    PT Stop Time 1610    PT Time Calculation (min) 40 min    Activity Tolerance Patient tolerated treatment well    Behavior During Therapy WFL for tasks assessed/performed               Past Medical History:  Diagnosis Date   Allergy    Anemia    as child   Anxiety    Asthma    Constipation    Depression    GERD (gastroesophageal reflux disease)    Heart murmur    High cholesterol    Hypertension    Leg cramps    Lichen sclerosus et atrophicus    Numbness and tingling in hands    Postmenopausal HRT (hormone replacement therapy) - followed by Dr. Toney Rakes in gyn 07/11/2012   Swelling of both hands    Uterine cancer ([place]) 1991   UTI (urinary tract infection)    VIN III (vulvar intraepithelial neoplasia III)    left labia majora   VIN III (vulvar intraepithelial neoplasia III)    Past Surgical History:  Procedure Laterality Date   BREAST EXCISIONAL BIOPSY Left 08/24/2016   times 2   EYE SURGERY     "Lazy Muscle" x 2- one as child and another [place] BIOPSY N/A 11/18/2015   Procedure: WIDE LOCAL EXCISION OF VULVA and MEDIAL LEFT THIGH LESION ;  Surgeon: Terrance Mass, MD;  Location: [facility];  Service: Gynecology;  Laterality: N/A;   WISDOM TOOTH EXTRACTION     Patient Active Problem List   Diagnosis Date Noted   Bilateral carpal tunnel syndrome 07/13/2017   LGSIL Pap smear of vagina 09/08/2016   Vulva cancer ([place]) 04/22/2016   Tobacco abuse 12/01/2015   History of uterine cancer  10/07/2015   VIN III (vulvar intraepithelial neoplasia III) 09/18/2015   Postlaminectomy syndrome, lumbar region 12/17/2014   Right lumbar radiculitis 01/74/9449   Lichen sclerosus 67/59/1638   History of vulvar dysplasia 04/08/2014   Asthma, chronic 08/21/2012   Essential hypertension, benign 08/21/2012   Hyperlipemia 08/21/2012   Anxiety 08/21/2012   Chronic low back pain 08/21/2012   Allergic rhinitis 08/21/2012   Recurrent UTI after sex, uses prophylactic cipro with diflucan for prevention yeast infection 08/21/2012   Symptoms, such as flushing, sleeplessness, headache, lack of concentration, associated with the menopause 07/11/2012   Postmenopausal HRT (hormone replacement therapy) - followed by Dr. Toney Rakes in gyn 07/11/2012    REFERRING DIAG: Lumbar spine pain [M54.50]   THERAPY DIAG:  Chronic low back pain, unspecified back pain laterality, unspecified whether sciatica present  Muscle weakness  Rationale for Evaluation and Treatment Rehabilitation  PERTINENT HISTORY: Laminectomy (~4665)   PRECAUTIONS: None  SUBJECTIVE:  SUBJECTIVE STATEMENT:  7/10 pain reported, noted most symptoms with bending and twisting tasks during housework.   PAIN:  Are you having pain? Yes Pain location: R sided low back pain L1 to R glute NPRS scale:  current 7/10  average 7/10  Aggravating factors: lifting, bending           NPRS, highest: 9/10 Relieving factors: rest           NPRS: best: 5/10 Pain description: constant, stabbing, and aching Stage: Chronic Stability: staying the same 24 hour pattern: stiff in morning; worse with activity    OBJECTIVE: (objective measures completed at initial evaluation unless otherwise dated)   DIAGNOSTIC FINDINGS:  IMPRESSION: 1. No evidence of acute  abnormality. 2. Multilevel degenerative changes, moderate to severe at L5-S1. 3. Moderate to severe multilevel facet arthropathy, greatest in the mid and LOWER lumbar spine.   SENSATION:          Light touch: Deficits R lateral knee and thigh reduced   MUSCLE LENGTH: Hamstrings: Right no restriction; Left no restriction ASLR: Right ASLR = PSLR; Left ASLR = PSLR Thomas test: Right significant restriction; Left subtle restriction    LUMBAR SPECIAL TESTS:  Straight leg raise: L (-), R (-) Slump: L (-), R (-)   LUMBAR AROM   AROM AROM  03/24/2022  Flexion Fingertips to toes (WNL)  Extension limited by 50%  Right lateral flexion limited by 25%  Left lateral flexion limited by 25%, w/ concordant pain  Right rotation limited by 50%  Left rotation limited by 50%    (Blank rows = not tested)    LE MMT:   MMT Right 03/24/2022 Left 03/24/2022  Hip flexion (L2, L3) 5 5  Knee extension (L3) 5 5  Knee flexion 5 5  Hip abduction 4 4+  Hip extension 4 5  Hip external rotation      Hip internal rotation      Hip adduction      Ankle dorsiflexion (L4) c c  Ankle plantarflexion (S1) c c  Ankle inversion      Ankle eversion      Great Toe ext (L5) c c  Grossly        (Blank rows = not tested, score listed is out of 5 possible points.  N = WNL, D = diminished, C = clear for gross weakness with myotome testing, * = concordant pain with testing)   Functional Tests   Eval (03/24/2022)      30'' STS: 15x  UE used? N      Standard plank from feet: 40'' (norm 70'' from feet)      Sidelying plank from feet: L 17'', R 18'' (norm >30'')        Supine single leg bridge: L 30'', R 20''                                                                                  PATIENT SURVEYS:  FOTO 61 -> 66     TODAY'S TREATMENT  [facility] Adult PT Treatment:  DATE: 04/12/22 Therapeutic Exercise: Nustep L2 6 min Cross body fig 4 piriformis  stretch B 30s x2 Curl ups 15x Open book 10/10 SKTC 15/15 (cued for full ROM/stretch) Bridge with ball squeeze 15x DKTC with ball 15x Supine hip fallouts GTB 15x Sidelie clams GTB 15/15 STS w/OH reach 10x 90/90 30s   [facility] Adult PT Treatment:                                                DATE: 04/07/2022 Therapeutic Exercise: Nustep level 5 x 5 mins while gathering subjective Standing hip abduction/extension RTB at ankles 2x10 Palloff press 7# 2x10 BIL Standing abdominal press down red pball 3" hold 2x10 Figure 4 bridge 2x10 BIL Supine figure 4 piriformis stretch x1' BIL Modified thomas stretch x1' BIL Supine butterfly stretch x1' LTR x10 BIL Deadlift with 10# KB from 8" step (cues for form - hip hinge, knee bend, not rounding thoracic spine) 2x10  [facility] Adult PT Treatment:                                                DATE: 03/31/2022 Therapeutic Exercise: Nustep level 5 x 5 mins while gathering subjective Standing hip abduction/extension RTB at ankles 2x10 Palloff press 7# 2x10 BIL Figure 4 bridge 2x10 BIL Side plank with knees bent 2x20" BIL Plank from feet 2x20" LTR x10 BIL Open books x10 BIL   03/24/2022: Creating, reviewing, and completing below HEP   PATIENT EDUCATION:  POC, diagnosis, prognosis, HEP, and outcome measures.  Pt educated via explanation, demonstration, and handout (HEP).  Pt confirms understanding verbally.    HOME EXERCISE PROGRAM: Access Code: [email or web address] URL: [email or web address] Date: 03/24/2022 Prepared by: Shearon Balo   Exercises - Supine Lower Trunk Rotation  - 1 x daily - 7 x weekly - 1 sets - 20 reps - 3 hold - Figure 4 Bridge  - 1 x daily - 7 x weekly - 3 sets - 10 reps - Standard Plank  - 1 x daily - 7 x weekly - 1 sets - 3 reps - 15 second hold - Side Plank on Elbow  - 1 x daily - 7 x weekly - 1 sets - 3 reps - 10 sec hold Added 03/31/22 - Sidelying Open Book Thoracic Lumbar Rotation and Extension  - 1 x daily - 7 x  weekly - 1 sets - 10 reps   ASTERISK SIGNS     Asterisk Signs Eval (03/24/2022)            Hip flexor tightness ++            Side plank L 17'', R 18'' (norm >30'')            plank 40'' (norm 70'' from feet)            Single leg bridge L 30'', R 20''                               ASSESSMENT:   CLINICAL IMPRESSION: Today's session focused on core strength and flexibility tasks.  Lessened resistance on Nustep, added static core tasks to address abdominal weakness.  Abdominal activation stressed and core activity.  OBJECTIVE IMPAIRMENTS: Pain, lumbar ROM, core strength, hip strength   ACTIVITY LIMITATIONS: bending, lifting, sitting, working   PERSONAL FACTORS: See medical history and pertinent history     REHAB POTENTIAL: Good   CLINICAL DECISION MAKING: Stable/uncomplicated   EVALUATION COMPLEXITY: Low     GOALS:     SHORT TERM GOALS: Target date: 04/21/2022   Jaymarie will be >75% HEP compliant to improve carryover between sessions and facilitate independent management of condition   Evaluation (03/24/2022): ongoing Goal status: INITIAL     LONG TERM GOALS: Target date: 05/19/2022   Adna will improve FOTO score to 66 as a proxy for functional improvement   Evaluation/Baseline (03/24/2022): 61 Goal status: INITIAL     2.  Analaya will self report >/= 50% decrease in pain from evaluation    Evaluation/Baseline (03/24/2022): 9/10 max pain Goal status: INITIAL     3.  Odaly will be able to maintain plank for 60'' from feet (norm for healthy adult is ~70'' from feet)    Evaluation/Baseline (03/24/2022): 40'' from feet Goal status: INITIAL     4.  Kassiah will be able to maintain side plank from feet for 25'' (norm for healthy adult is 30'' from feet)    Evaluation/Baseline (03/24/2022): L 17'', R 18''  from feet Target date: 05/19/2022 Goal status: INITIAL     5.  Omaya will be able to maintain supine single leg bridge for 40'' as evidence of improved  hip extension and core strength (norm for healthy adult 30''- 60'')    Evaluation/Baseline (03/24/2022): L 30'', R 20'' Goal status: INITIAL     6.  Lynlee will be able to lift 25 lbs from the floor and place on a 3 foot counter, not limited by pain    Evaluation/Baseline (03/24/2022): limited d/t pain Goal status: INITIAL       PLAN: PT FREQUENCY: 1-2x/week   PT DURATION: 8 weeks (Ending 05/19/2022)   PLANNED INTERVENTIONS: Therapeutic exercises, Aquatic therapy, Therapeutic activity, Neuro Muscular re-education, Gait training, Patient/Family education, Joint mobilization, Dry Needling, Electrical stimulation, Spinal mobilization and/or manipulation, Moist heat, Taping, Vasopneumatic device, Ionotophoresis '4mg'$ /ml Dexamethasone, and Manual therapy   PLAN FOR NEXT SESSION: progressive hip and core strengthening eventual D/L progression   Lanice Shirts, PT 04/12/2022, 4:14 PM

## 2022-04-21 ENCOUNTER — Ambulatory Visit: Payer: Medicare HMO | Admitting: Physical Therapy

## 2022-04-21 ENCOUNTER — Telehealth: Payer: Self-pay | Admitting: Adult Health

## 2022-04-21 NOTE — Telephone Encounter (Signed)
Patient starts she is in terrible pain, seeking guidance as to what she can do for the symptoms, declined OV

## 2022-04-22 NOTE — Telephone Encounter (Signed)
This has been taking care of. Pt advised to take Tylenol or Aleve to help with pain. Pt verbalized understanding. No further actions needed!

## 2022-04-25 ENCOUNTER — Ambulatory Visit
Admission: RE | Admit: 2022-04-25 | Discharge: 2022-04-25 | Disposition: A | Discharge status: Home / Self Care | Payer: Medicare HMO | Source: Ambulatory Visit | Attending: Adult Health | Admitting: Adult Health

## 2022-04-25 DIAGNOSIS — M545 Low back pain, unspecified: Secondary | ICD-10-CM

## 2022-04-26 ENCOUNTER — Encounter: Payer: Self-pay | Admitting: Adult Health

## 2022-04-26 ENCOUNTER — Ambulatory Visit (INDEPENDENT_AMBULATORY_CARE_PROVIDER_SITE_OTHER): Payer: Medicare HMO | Admitting: Adult Health

## 2022-04-26 VITALS — BP 136/86 | HR 86 | Temp 97.6°F | Ht 66.0 in | Wt 209.0 lb

## 2022-04-26 DIAGNOSIS — R0683 Snoring: Secondary | ICD-10-CM | POA: Diagnosis not present

## 2022-04-26 NOTE — Progress Notes (Signed)
$'@Patient's$  ID: Kristin Merritt, female    DOB: 27-Sep-1960, 61 y.o.   MRN: [id number]  Chief Complaint  Patient presents with   Consult    Referring provider: Dorothyann Peng, NP  HPI: 61 year old female seen for sleep consult April 26, 2022 for loud snoring and daytime sleepiness  TEST/EVENTS :   04/26/2022 Sleep consult  Patient presents for sleep consult today.  Kindly referred by primary care provider Sallee Provencal, NP .  Patient complains of very loud snoring and daytime sleepiness.  Patient says that she goes to sleep and sleeps throughout the night.  But does not wake up feeling refreshed.  Her significant other reports very loud snoring.  She says she typically goes to bed about 10 PM.  Gets up at 6 AM.  Is up once or twice throughout the night.  Weight is up 20 pounds over the last 2 years.  Current weight is at 209 pounds with a BMI at 33.  Patient says she was tested for sleep apnea in 2016 and told that she had no sleep apnea.  NPSG in June 2016 showed AHI at 3.8/hour.  She takes a minimum caffeine.  No history of stroke or congestive heart failure.  She does have dentures.  She does not use any sleep aids.  No symptoms suspicious for cataplexy or sleep paralysis.  Epworth score is 1 out of 24.  Typically gets sleepy if she sits down to watch TV.  Medical history significant for hypertension, diabetes, hyperlipidemia, history of cervical cancer status post hysterectomy  Surgical history hysterectomy and back surgery  Social history patient is single.  Lives with her boyfriend.  Works as a Education officer, museum.  Quit smoking 5 years ago.  She does not drink alcohol.  No drug use.  Family history positive for allergies and cancer  Allergies  Allergen Reactions   Percocet [Oxycodone-Acetaminophen] Itching    Immunization History  Administered Date(s) Administered   Influenza, High Dose Seasonal PF 05/04/2013, 07/27/2017   Influenza,inj,Quad PF,6+ Mos 01/29/2013, 02/13/2014,  02/21/2015, 03/19/2016, 03/09/2017, 01/23/2021, 04/02/2022   Influenza-Unspecified 02/21/2018, 03/07/2020   Moderna Covid-19 Vaccine Bivalent Booster 18yr & up 05/08/2021   Moderna Sars-Covid-2 Vaccination 08/20/2019, 09/12/2019, 03/03/2020   PNEUMOCOCCAL CONJUGATE-20 01/15/2021   PPD Test 06/03/2014, 08/19/2015, 10/06/2016   Tdap 01/09/2013    Past Medical History:  Diagnosis Date   Allergy    Anemia    as child   Anxiety    Asthma    Constipation    Depression    GERD (gastroesophageal reflux disease)    Heart murmur    High cholesterol    Hypertension    Leg cramps    Lichen sclerosus et atrophicus    Numbness and tingling in hands    Postmenopausal HRT (hormone replacement therapy) - followed by Dr. 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 1-2 puffs as needed every 4-6 hours for cough and wheezing     atorvastatin (LIPITOR) 20 MG tablet TAKE 1 TABLET BY MOUTH EVERY DAY 90 tablet 0   azelastine (OPTIVAR) 0.05 % ophthalmic solution Apply to eye.     Azelastine-Fluticasone 137-50 MCG/ACT SUSP 1 puff in each nostril     cyclobenzaprine (FLEXERIL) 10 MG tablet Take 1 tablet (10 mg total) by mouth 3 (three) times daily as needed for muscle spasms. 30 tablet 0   docusate sodium (COLACE) 100 MG capsule      EPIPEN 2-PAK 0.3 MG/0.3ML SOAJ injection      FIBER ADULT GUMMIES PO Take by mouth.      ibuprofen (ADVIL) 800 MG tablet Take 1 tablet (800 mg total) by mouth 3 (three) times daily as needed. 30 tablet 2   KLOR-CON M10 10 MEQ tablet TAKE 2 TABLETS BY MOUTH DAILY 180 tablet 1   lansoprazole (PREVACID) 30 MG capsule TAKE 1 CAPSULE BY MOUTH ONCE DAILY ONCE A DAY 90 capsule 3   levocetirizine (XYZAL) 5 MG tablet Take 5 mg by mouth every evening.      losartan-hydrochlorothiazide (HYZAAR) 100-25 MG tablet TAKE 1 TABLET BY MOUTH EVERY DAY 90 tablet 3   metFORMIN (GLUCOPHAGE) 500 MG tablet TAKE 1 TABLET BY MOUTH TWICE A DAY WITH FOOD 180 tablet 0   montelukast (SINGULAIR) 10 MG tablet Take 10 mg by mouth at bedtime.     potassium chloride SA (KLOR-CON M20) 20 MEQ tablet TAKE 1 TABLET BY MOUTH EVERY DAY 90 tablet 0   tirzepatide (MOUNJARO) 2.5 MG/0.5ML Pen Inject 2.5 mg into the skin once a week. 2 mL 0   VITAMIN D PO Take 400 Int'l Units by mouth.     No facility-administered medications prior to visit.     Review of Systems:   Constitutional:   No  weight loss, night sweats,  Fevers, chills +, fatigue, or  lassitude.  HEENT:   No headaches,  Difficulty swallowing,  Tooth/dental problems, or  Sore throat,                No sneezing, itching, ear ache, nasal congestion, post nasal drip,   CV:  No chest pain,  Orthopnea, PND, swelling in lower extremities, anasarca, dizziness, palpitations, syncope.   GI  No heartburn, indigestion, abdominal pain, nausea, vomiting, diarrhea, change in bowel habits, loss of appetite, bloody stools.   Resp: No shortness of breath with exertion or at rest.  No excess mucus, no productive cough,  No non-productive cough,  No coughing up of blood.  No change in color of mucus.  No wheezing.  No chest wall deformity  Skin: no rash or lesions.  GU: no dysuria, change in color of urine, no urgency or frequency.  No flank pain, no hematuria   MS:  No joint pain or swelling.  No decreased range of motion.  No back pain.    Physical Exam  BP  136/86 (BP Location: Left Arm, Patient Position: Sitting, Cuff Size: Large)   Pulse 86   Temp 97.6 F (36.4 C) (Oral)   Ht '5\' 6"'$  (1.676 m)   Wt 209 lb (94.8 kg)   SpO2 97%   BMI 33.73 kg/m   GEN: A/Ox3; pleasant , NAD, well nourished    HEENT:  [place]/AT,  NOSE-clear, THROAT-clear, no lesions, no postnasal drip or exudate noted.  Class III MP airway  NECK:  Supple w/ fair  ROM; no JVD; normal carotid impulses w/o bruits; no thyromegaly or nodules palpated; no lymphadenopathy.    RESP  Clear  P & A; w/o, wheezes/ rales/ or rhonchi. no accessory muscle use, no dullness to percussion  CARD:  RRR, no m/r/g, no peripheral edema, pulses intact, no cyanosis or clubbing.  GI:   Soft & nt; nml bowel sounds; no organomegaly or masses detected.   Musco: Warm bil, no deformities or joint swelling noted.   Neuro: alert, no focal deficits noted.    Skin: Warm, no lesions or rashes    Lab Results:  CBC    Component Value Date/Time   WBC 4.8 04/02/2022 0802   RBC 4.82 04/02/2022 0802   HGB 12.1 04/02/2022 0802   HCT 38.3 04/02/2022 0802   PLT 163.0 04/02/2022 0802   MCV 79.4 04/02/2022 0802   MCV 86.2 06/26/2012 1614   MCH 25.9 (L) 11/12/2015 1155   MCHC 31.7 04/02/2022 0802   RDW 15.4 04/02/2022 0802   LYMPHSABS 1.8 04/02/2022 0802   MONOABS 0.4 04/02/2022 0802   EOSABS 0.1 04/02/2022 0802   BASOSABS 0.0 04/02/2022 0802    BMET    Component Value Date/Time   NA 140 04/02/2022 0802   K 3.7 04/02/2022 0802   CL 103 04/02/2022 0802   CO2 30 04/02/2022 0802   GLUCOSE 100 (H) 04/02/2022 0802   BUN 15 04/02/2022 0802   CREATININE 0.83 04/02/2022 0802   CALCIUM 9.2 04/02/2022 0802   GFRNONAA >60 11/12/2015 1155   GFRAA >60 11/12/2015 1155    BNP No results found for: "BNP"  ProBNP No results found for: "PROBNP"  Imaging: MR Lumbar Spine Wo Contrast  Result Date: 04/26/2022 CLINICAL DATA:  Low back pain EXAM: MRI LUMBAR SPINE WITHOUT CONTRAST TECHNIQUE: Multiplanar,  multisequence MR imaging of the lumbar spine was performed. No intravenous contrast was administered. COMPARISON:  11/18/2014 FINDINGS: Segmentation:  Standard. Alignment:  Physiologic. Vertebrae:  Endplate remodeling at W4-X3 Conus medullaris and cauda equina: Conus extends to the L1 level. Conus and cauda equina appear normal. Paraspinal and other soft tissues: Negative Disc levels: T12-L1: Small right subarticular disc protrusion without stenosis. L1-L2: Normal disc space and facet joints. No spinal canal stenosis. No neural foraminal stenosis. L2-L3: Normal disc space and facet joints. No spinal canal stenosis. No neural foraminal stenosis. L3-L4: Mild facet arthrosis. No disc herniation. No spinal canal stenosis. No neural foraminal stenosis. L4-L5: Minimal disc bulge with mild facet hypertrophy. No spinal canal stenosis. No neural foraminal stenosis. L5-S1: Remote right L5 laminotomy. Disc degeneration with bilateral extraforaminal osteophytes and small disc bulge. The osteophytes may affect the extraforaminal exiting nerve roots. No spinal canal stenosis. Mild bilateral neural foraminal stenosis. Possible scar tissue near the right S1 nerve root appears decreased. Visualized sacrum: Normal. IMPRESSION: 1. L5-S1 bilateral extraforaminal osteophytes and small disc bulge may affect the extraforaminal exiting nerve roots. Mild bilateral L5-S1 neural foraminal stenosis. 2. Otherwise mild lower lumbar degenerative disc disease without spinal canal or neural foraminal stenosis. 3. Remote right L5 laminotomy. Probable scar tissue around the right S1 nerve root appears decreased. Electronically Signed   By: Ulyses Jarred M.D.   On: 04/26/2022 18:10          No data to display          No results found for: "NITRICOXIDE"      Assessment & Plan:   Snoring Loud snoring, daytime sleepiness concerning for underlying sleep apnea. We will set patient up for home  sleep study.  Patient education on sleep  apnea - discussed how weight can impact sleep and risk for sleep disordered breathing - discussed options to assist with weight loss: combination of diet modification, cardiovascular and strength training exercises   - had an extensive discussion regarding the adverse health consequences related to untreated sleep disordered breathing - specifically discussed the risks for hypertension, coronary artery disease, cardiac dysrhythmias, cerebrovascular disease, and diabetes - lifestyle modification discussed   - discussed how sleep disruption can increase risk of accidents, particularly when driving - safe driving practices were discussed   Plan  Patient Instructions  Set up for home sleep study  Work on healthy sleep regimen  Healthy weight loss  Do not drive if sleepy  Follow up in 3 months to discuss results and treatment plan .        Rexene Edison, NP 04/26/2022

## 2022-04-26 NOTE — Assessment & Plan Note (Signed)
Loud snoring, daytime sleepiness concerning for underlying sleep apnea. We will set patient up for home sleep study.  Patient education on sleep apnea - discussed how weight can impact sleep and risk for sleep disordered breathing - discussed options to assist with weight loss: combination of diet modification, cardiovascular and strength training exercises   - had an extensive discussion regarding the adverse health consequences related to untreated sleep disordered breathing - specifically discussed the risks for hypertension, coronary artery disease, cardiac dysrhythmias, cerebrovascular disease, and diabetes - lifestyle modification discussed   - discussed how sleep disruption can increase risk of accidents, particularly when driving - safe driving practices were discussed   Plan  Patient Instructions  Set up for home sleep study  Work on healthy sleep regimen  Healthy weight loss  Do not drive if sleepy  Follow up in 3 months to discuss results and treatment plan .

## 2022-04-26 NOTE — Patient Instructions (Signed)
Set up for home sleep study  Work on healthy sleep regimen  Healthy weight loss  Do not drive if sleepy  Follow up in 3 months to discuss results and treatment plan .

## 2022-04-27 NOTE — Progress Notes (Signed)
Reviewed and agree with assessment/plan.   Chesley Mires, MD [facility] Pulmonary/Critical Care 04/27/2022, 5:28 PM Pager:  [phone]

## 2022-04-28 ENCOUNTER — Ambulatory Visit: Payer: Medicare HMO | Attending: Adult Health

## 2022-04-28 DIAGNOSIS — M545 Low back pain, unspecified: Secondary | ICD-10-CM | POA: Insufficient documentation

## 2022-04-28 DIAGNOSIS — M6281 Muscle weakness (generalized): Secondary | ICD-10-CM | POA: Diagnosis present

## 2022-04-28 DIAGNOSIS — G8929 Other chronic pain: Secondary | ICD-10-CM | POA: Insufficient documentation

## 2022-04-28 NOTE — Therapy (Addendum)
PHYSICAL THERAPY UNPLANNED DISCHARGE SUMMARY   Visits from Start of Care: 5  Current functional level related to goals / functional outcomes: Current status unknown   Remaining deficits: Current status unknown   Education / Equipment: Pt has not returned since visit listed below  Patient goals were not assessed. Patient is being discharged due to not returning since the last visit.  (the below note was addended to include the above D/C summary on 06/04/22)  OUTPATIENT PHYSICAL THERAPY TREATMENT NOTE   Patient Name: Kristin Merritt MRN: [id number] DOB:12-03-1960, 61 y.o., female Today's Date: 04/28/2022  PCP: Dorothyann Peng, NP  REFERRING PROVIDER: Dorothyann Peng, NP   END OF SESSION:   PT End of Session - 04/28/22 1426     Visit Number 5    Date for PT Re-Evaluation 05/19/22    Authorization Type [employer] MCR    Progress Note Due on Visit 10    PT Start Time 1440    PT Stop Time 1520    PT Time Calculation (min) 40 min    Activity Tolerance Patient tolerated treatment well    Behavior During Therapy WFL for tasks assessed/performed              Past Medical History:  Diagnosis Date   Allergy    Anemia    as child   Anxiety    Asthma    Constipation    Depression    GERD (gastroesophageal reflux disease)    Heart murmur    High cholesterol    Hypertension    Leg cramps    Lichen sclerosus et atrophicus    Numbness and tingling in hands    Postmenopausal HRT (hormone replacement therapy) - followed by Dr. Toney Rakes in gyn 07/11/2012   Swelling of both hands    Uterine cancer ([place]) 1991   UTI (urinary tract infection)    VIN III (vulvar intraepithelial neoplasia III)    left labia majora   VIN III (vulvar intraepithelial neoplasia III)    Past Surgical History:  Procedure Laterality Date   BREAST EXCISIONAL BIOPSY Left 08/24/2016   times 2   EYE SURGERY     "Lazy Muscle" x 2- one as child and another [place] BIOPSY N/A 11/18/2015   Procedure: WIDE LOCAL EXCISION OF VULVA and MEDIAL LEFT THIGH LESION ;  Surgeon: Terrance Mass, MD;  Location: [facility];  Service: Gynecology;  Laterality: N/A;   WISDOM TOOTH EXTRACTION     Patient Active Problem List   Diagnosis Date Noted   Snoring 04/26/2022   Bilateral carpal tunnel syndrome 07/13/2017   LGSIL Pap smear of vagina 09/08/2016   Vulva cancer ([place]) 04/22/2016   Tobacco abuse 12/01/2015   History of uterine cancer 10/07/2015   VIN III (vulvar intraepithelial neoplasia III) 09/18/2015   Postlaminectomy syndrome, lumbar region 12/17/2014   Right lumbar radiculitis 83/01/4075   Lichen sclerosus 80/88/1103   History of vulvar dysplasia 04/08/2014   Asthma, chronic 08/21/2012   Essential hypertension, benign 08/21/2012   Hyperlipemia 08/21/2012   Anxiety 08/21/2012   Chronic low back pain 08/21/2012   Allergic rhinitis 08/21/2012   Recurrent UTI after sex, uses prophylactic cipro with diflucan for prevention yeast infection 08/21/2012   Symptoms, such as flushing, sleeplessness, headache, lack of concentration, associated with the menopause 07/11/2012   Postmenopausal HRT (hormone replacement therapy) - followed by Dr. Toney Rakes in gyn  07/11/2012    REFERRING DIAG: Lumbar spine pain [M54.50]   THERAPY DIAG:  Chronic low back pain, unspecified back pain laterality, unspecified whether sciatica present  Muscle weakness  Rationale for Evaluation and Treatment Rehabilitation  PERTINENT HISTORY: Laminectomy (~6767)   PRECAUTIONS: None  SUBJECTIVE:                                                                                                                                                                                      SUBJECTIVE STATEMENT:  Patient reports that she had a couple of days last week where her pain was "beyond 10/10" going from her right buttock down her thigh. She states it is better  today.   PAIN:  Are you having pain? Yes Pain location: R sided low back pain L1 to R glute NPRS scale:  current 4-5/10  average 7/10  Aggravating factors: lifting, bending           NPRS, highest: 9/10 Relieving factors: rest           NPRS: best: 5/10 Pain description: constant, stabbing, and aching Stage: Chronic Stability: staying the same 24 hour pattern: stiff in morning; worse with activity    OBJECTIVE: (objective measures completed at initial evaluation unless otherwise dated)   DIAGNOSTIC FINDINGS:  IMPRESSION: 1. No evidence of acute abnormality. 2. Multilevel degenerative changes, moderate to severe at L5-S1. 3. Moderate to severe multilevel facet arthropathy, greatest in the mid and LOWER lumbar spine.   SENSATION:          Light touch: Deficits R lateral knee and thigh reduced   MUSCLE LENGTH: Hamstrings: Right no restriction; Left no restriction ASLR: Right ASLR = PSLR; Left ASLR = PSLR Thomas test: Right significant restriction; Left subtle restriction    LUMBAR SPECIAL TESTS:  Straight leg raise: L (-), R (-) Slump: L (-), R (-)   LUMBAR AROM   AROM AROM  03/24/2022  Flexion Fingertips to toes (WNL)  Extension limited by 50%  Right lateral flexion limited by 25%  Left lateral flexion limited by 25%, w/ concordant pain  Right rotation limited by 50%  Left rotation limited by 50%    (Blank rows = not tested)    LE MMT:   MMT Right 03/24/2022 Left 03/24/2022  Hip flexion (L2, L3) 5 5  Knee extension (L3) 5 5  Knee flexion 5 5  Hip abduction 4 4+  Hip extension 4 5  Hip external rotation      Hip internal rotation      Hip adduction      Ankle dorsiflexion (L4) c c  Ankle plantarflexion (S1) c c  Ankle inversion  Ankle eversion      Great Toe ext (L5) c c  Grossly        (Blank rows = not tested, score listed is out of 5 possible points.  N = WNL, D = diminished, C = clear for gross weakness with myotome testing, * = concordant  pain with testing)   Functional Tests   Eval (03/24/2022)      30'' STS: 15x  UE used? N      Standard plank from feet: 56'' (norm 70'' from feet)      Sidelying plank from feet: L 17'', R 18'' (norm >30'')        Supine single leg bridge: L 30'', R 20''                                                                                  PATIENT SURVEYS:  FOTO 61 -> 66     TODAY'S TREATMENT  [facility] Adult PT Treatment:                                                DATE: 04/28/2022 Therapeutic Exercise: Nustep level 5 x 5 mins Standing hip abduction/extension RTB at ankles 2x10 Palloff press 7# 2x10 BIL Figure 4 bridge 2x10 BIL Supine figure 4 piriformis stretch x1' BIL Modified thomas stretch x1' BIL Supine butterfly stretch x1' LTR x10 BIL Open books x10 BIL Supine sciatic nerve glides Rt x20 DKTC with ball 15x  [facility] Adult PT Treatment:                                                DATE: 04/12/22 Therapeutic Exercise: Nustep L2 6 min Cross body fig 4 piriformis stretch B 30s x2 Curl ups 15x Open book 10/10 SKTC 15/15 (cued for full ROM/stretch) Bridge with ball squeeze 15x DKTC with ball 15x Supine hip fallouts GTB 15x Sidelie clams GTB 15/15 STS w/OH reach 10x 90/90 30s   [facility] Adult PT Treatment:                                                DATE: 04/07/2022 Therapeutic Exercise: Nustep level 5 x 5 mins while gathering subjective Standing hip abduction/extension RTB at ankles 2x10 Palloff press 7# 2x10 BIL Standing abdominal press down red pball 3" hold 2x10 Figure 4 bridge 2x10 BIL Supine figure 4 piriformis stretch x1' BIL Modified thomas stretch x1' BIL Supine butterfly stretch x1' LTR x10 BIL Deadlift with 10# KB from 8" step (cues for form - hip hinge, knee bend, not rounding thoracic spine) 2x10    PATIENT EDUCATION:  POC, diagnosis, prognosis, HEP, and outcome measures.  Pt educated via explanation, demonstration, and handout (HEP).  Pt  confirms understanding verbally.    HOME EXERCISE PROGRAM: Access Code:  [email or web address] URL: [email or web address] Date: 03/24/2022 Prepared by: Shearon Balo   Exercises - Supine Lower Trunk Rotation  - 1 x daily - 7 x weekly - 1 sets - 20 reps - 3 hold - Figure 4 Bridge  - 1 x daily - 7 x weekly - 3 sets - 10 reps - Standard Plank  - 1 x daily - 7 x weekly - 1 sets - 3 reps - 15 second hold - Side Plank on Elbow  - 1 x daily - 7 x weekly - 1 sets - 3 reps - 10 sec hold Added 03/31/22 - Sidelying Open Book Thoracic Lumbar Rotation and Extension  - 1 x daily - 7 x weekly - 1 sets - 10 reps   ASTERISK SIGNS     Asterisk Signs Eval (03/24/2022)            Hip flexor tightness ++            Side plank L 17'', R 18'' (norm >30'')            plank 40'' (norm 70'' from feet)            Single leg bridge L 30'', R 20''                               ASSESSMENT:   CLINICAL IMPRESSION: Patient presents to PT reporting increased pain last week that radiated from her buttocks down her thigh and even to her foot. She states she took muscle relaxers and anti-inflammatory medication and that her pain has improved today. Session today focused on core and proximal hip strengthening as well as lumbar mobility. Patient was able to tolerate all prescribed exercises with no adverse effects. Patient continues to benefit from skilled PT services and should be progressed as able to improve functional independence.   OBJECTIVE IMPAIRMENTS: Pain, lumbar ROM, core strength, hip strength   ACTIVITY LIMITATIONS: bending, lifting, sitting, working   PERSONAL FACTORS: See medical history and pertinent history     REHAB POTENTIAL: Good   CLINICAL DECISION MAKING: Stable/uncomplicated   EVALUATION COMPLEXITY: Low     GOALS:     SHORT TERM GOALS: Target date: 04/21/2022   Rayanna will be >75% HEP compliant to improve carryover between sessions and facilitate independent management of  condition   Evaluation (03/24/2022): ongoing Goal status: INITIAL     LONG TERM GOALS: Target date: 05/19/2022   Ledora will improve FOTO score to 66 as a proxy for functional improvement   Evaluation/Baseline (03/24/2022): 61 Goal status: INITIAL     2.  Safiatou will self report >/= 50% decrease in pain from evaluation    Evaluation/Baseline (03/24/2022): 9/10 max pain Goal status: INITIAL     3.  Jehieli will be able to maintain plank for 60'' from feet (norm for healthy adult is ~70'' from feet)    Evaluation/Baseline (03/24/2022): 40'' from feet Goal status: INITIAL     4.  Laurisa will be able to maintain side plank from feet for 25'' (norm for healthy adult is 30'' from feet)    Evaluation/Baseline (03/24/2022): L 17'', R 18''  from feet Target date: 05/19/2022 Goal status: INITIAL     5.  Asna will be able to maintain supine single leg bridge for 40'' as evidence of improved hip extension and core strength (norm for healthy adult 30''- 60'')    Evaluation/Baseline (03/24/2022): L 30'', R 20'' Goal status: INITIAL  6.  Tacha will be able to lift 25 lbs from the floor and place on a 3 foot counter, not limited by pain    Evaluation/Baseline (03/24/2022): limited d/t pain Goal status: INITIAL       PLAN: PT FREQUENCY: 1-2x/week   PT DURATION: 8 weeks (Ending 05/19/2022)   PLANNED INTERVENTIONS: Therapeutic exercises, Aquatic therapy, Therapeutic activity, Neuro Muscular re-education, Gait training, Patient/Family education, Joint mobilization, Dry Needling, Electrical stimulation, Spinal mobilization and/or manipulation, Moist heat, Taping, Vasopneumatic device, Ionotophoresis '4mg'$ /ml Dexamethasone, and Manual therapy   PLAN FOR NEXT SESSION: progressive hip and core strengthening eventual D/L progression   Margarette Canada, PTA 04/28/2022, 3:20 PM

## 2022-05-04 ENCOUNTER — Other Ambulatory Visit: Payer: Self-pay

## 2022-05-04 DIAGNOSIS — M47819 Spondylosis without myelopathy or radiculopathy, site unspecified: Secondary | ICD-10-CM

## 2022-05-05 ENCOUNTER — Ambulatory Visit: Payer: Medicare HMO

## 2022-05-09 ENCOUNTER — Other Ambulatory Visit: Payer: Self-pay | Admitting: Adult Health

## 2022-05-09 DIAGNOSIS — E1169 Type 2 diabetes mellitus with other specified complication: Secondary | ICD-10-CM

## 2022-05-12 ENCOUNTER — Encounter: Payer: Self-pay | Admitting: Adult Health

## 2022-05-12 ENCOUNTER — Ambulatory Visit (INDEPENDENT_AMBULATORY_CARE_PROVIDER_SITE_OTHER): Payer: Medicare HMO | Admitting: Adult Health

## 2022-05-12 VITALS — BP 122/72 | HR 82 | Temp 99.0°F | Ht 66.0 in | Wt 207.0 lb

## 2022-05-12 DIAGNOSIS — E669 Obesity, unspecified: Secondary | ICD-10-CM | POA: Diagnosis not present

## 2022-05-12 DIAGNOSIS — I10 Essential (primary) hypertension: Secondary | ICD-10-CM

## 2022-05-12 DIAGNOSIS — E1169 Type 2 diabetes mellitus with other specified complication: Secondary | ICD-10-CM | POA: Diagnosis not present

## 2022-05-12 MED ORDER — TIRZEPATIDE 5 MG/0.5ML ~~LOC~~ SOAJ: 5.0000 mg | SUBCUTANEOUS | 0 refills | Status: AC

## 2022-05-12 NOTE — Progress Notes (Signed)
Subjective:    Patient ID: Kristin Merritt, female    DOB: 1960/12/13, 61 y.o.   MRN: [id number]  HPI 61 year old female who  has a past medical history of Allergy, Anemia, Anxiety, Asthma, Constipation, Depression, GERD (gastroesophageal reflux disease), Heart murmur, High cholesterol, Hypertension, Leg cramps, Lichen sclerosus et atrophicus, Numbness and tingling in hands, Postmenopausal HRT (hormone replacement therapy) - followed by Dr. Toney Rakes in gyn (07/11/2012), Swelling of both hands, Uterine cancer ([place]) (1991), UTI (urinary tract infection), VIN III (vulvar intraepithelial neoplasia III), and VIN III (vulvar intraepithelial neoplasia III).  She presents to the office today for one month follow up regarding DM and HTN    During her visit in November she was started on Mounjaro 2.5 mg. She has been tolerating this medication well with no side effects.   Wt Readings from Last 3 Encounters:  05/12/22 207 lb (93.9 kg)  04/26/22 209 lb (94.8 kg)  04/09/22 211 lb (95.7 kg)   HTN - managed with Hyzaar 100-25 mg. She tolerated this medication. Does not have dizziness, lightheadedness, blurred vision, or chest pain    Review of Systems See HPI   Past Medical History:  Diagnosis Date   Allergy    Anemia    as child   Anxiety    Asthma    Constipation    Depression    GERD (gastroesophageal reflux disease)    Heart murmur    High cholesterol    Hypertension    Leg cramps    Lichen sclerosus et atrophicus    Numbness and tingling in hands    Postmenopausal HRT (hormone replacement therapy) - followed by Dr. Toney Rakes in gyn 07/11/2012   Swelling of both hands    Uterine cancer ([place]) 1991   UTI (urinary tract infection)    VIN III (vulvar intraepithelial neoplasia III)    left labia majora   VIN III (vulvar intraepithelial neoplasia III)     Social History   Socioeconomic History   Marital status: Widowed    Spouse name: Not on file   Number of children: Not on file    Years of education: Not on file   Highest education level: 12th grade  Occupational History   Not on file  Tobacco Use   Smoking status: Former    Packs/day: 0.25    Years: 20.00    Total pack years: 5.00    Types: Cigarettes    Quit date: 02/21/2017    Years since quitting: 5.2   Smokeless tobacco: Never  Vaping Use   Vaping Use: Never used  Substance and Sexual Activity   Alcohol use: Not Currently   Drug use: No   Sexual activity: Not Currently    Partners: Male    Birth control/protection: Surgical    Comment: 1st intercourse- 17, partners- 61, hysterectomy  Other Topics Concern   Not on file  Social History Narrative   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 Enjoys walking   Diet: Tries to watch what she eats. Does not eat a lot of processed foods or fast food.    Social Determinants of Health   Financial Resource Strain: Low Risk  (02/19/2022)   Overall Financial Resource Strain (CARDIA)    Difficulty of Paying Living Expenses: Not hard at all  Food Insecurity: No Food Insecurity (02/19/2022)   Hunger Vital Sign    Worried About Running Out of Food in the Last Year: Never true    Ran Out of Food in the Last Year: Never true  Transportation Needs: No Transportation Needs (02/19/2022)   PRAPARE - Hydrologist (Medical): No    Lack of Transportation (Non-Medical): No  Physical Activity: Sufficiently Active (02/19/2022)   Exercise Vital Sign    Days of Exercise per Week: 5 days    Minutes of Exercise per Session: 30 min  Stress: No Stress Concern Present (02/19/2022)   [place]    Feeling of Stress : Not at all  Social Connections: Unknown (02/19/2022)   Social Connection and Isolation Panel [NHANES]    Frequency of Communication with Friends and Family: More than  three times a week    Frequency of Social Gatherings with Friends and Family: Once a week    Attends Religious Services: Patient refused    Active Member of Clubs or Organizations: Yes    Attends Archivist Meetings: Patient refused    Marital Status: Widowed  Intimate Partner Violence: Not At Risk (05/01/2020)   Humiliation, Afraid, Rape, and Kick questionnaire    Fear of Current or Ex-Partner: No    Emotionally Abused: No    Physically Abused: No    Sexually Abused: No    Past Surgical History:  Procedure Laterality Date   BREAST EXCISIONAL BIOPSY Left 08/24/2016   times 2   EYE SURGERY     "Lazy Muscle" x 2- one as child and another Lane BIOPSY N/A 11/18/2015   Procedure: WIDE LOCAL EXCISION OF VULVA and MEDIAL LEFT THIGH LESION ;  Surgeon: Terrance Mass, MD;  Location: [facility];  Service: Gynecology;  Laterality: N/A;   WISDOM TOOTH EXTRACTION      Family History  Problem Relation Age of Onset   Cancer Mother        LUNG    Breast cancer Mother        Died   Esophageal cancer Mother    Colon cancer Neg Hx    Colon polyps Neg Hx    Rectal cancer Neg Hx    Stomach cancer Neg Hx     Allergies  Allergen Reactions   Percocet [Oxycodone-Acetaminophen] Itching    Current Outpatient Medications on File Prior to Visit  Medication Sig Dispense Refill   Albuterol Sulfate (PROAIR HFA IN) Inhale into the lungs. 1-2 puffs as needed every 4-6 hours for cough and wheezing     atorvastatin (LIPITOR) 20 MG tablet TAKE 1 TABLET BY MOUTH EVERY DAY 90 tablet 0   azelastine (OPTIVAR) 0.05 % ophthalmic solution Apply to eye.     Azelastine-Fluticasone 137-50 MCG/ACT SUSP 1 puff in each nostril     cyclobenzaprine (FLEXERIL) 10 MG tablet Take 1 tablet (10 mg total) by mouth 3 (three) times daily as needed for muscle spasms. 30 tablet 0   docusate sodium (COLACE) 100 MG capsule      EPIPEN 2-PAK 0.3  MG/0.3ML SOAJ injection      FIBER ADULT GUMMIES PO Take by mouth.     ibuprofen (ADVIL) 800 MG tablet Take 1 tablet (800 mg total) by mouth 3 (three) times daily as needed. 30 tablet 2   KLOR-CON M10  10 MEQ tablet TAKE 2 TABLETS BY MOUTH DAILY 180 tablet 1   lansoprazole (PREVACID) 30 MG capsule TAKE 1 CAPSULE BY MOUTH ONCE DAILY ONCE A DAY 90 capsule 3   levocetirizine (XYZAL) 5 MG tablet Take 5 mg by mouth every evening.      losartan-hydrochlorothiazide (HYZAAR) 100-25 MG tablet TAKE 1 TABLET BY MOUTH EVERY DAY 90 tablet 3   metFORMIN (GLUCOPHAGE) 500 MG tablet TAKE 1 TABLET BY MOUTH TWICE A DAY WITH FOOD 180 tablet 0   montelukast (SINGULAIR) 10 MG tablet Take 10 mg by mouth at bedtime.     potassium chloride SA (KLOR-CON M20) 20 MEQ tablet TAKE 1 TABLET BY MOUTH EVERY DAY 90 tablet 0   tirzepatide (MOUNJARO) 2.5 MG/0.5ML Pen INJECT 2.5 MG SUBCUTANEOUSLY WEEKLY 2 mL 1   VITAMIN D PO Take 400 Int'l Units by mouth.     No current facility-administered medications on file prior to visit.    BP 122/72   Pulse 82   Temp 99 F (37.2 C) (Oral)   Ht '5\' 6"'$  (1.676 m)   Wt 207 lb (93.9 kg)   SpO2 99%   BMI 33.41 kg/m       Objective:   Physical Exam Vitals and nursing note reviewed.  Constitutional:      Appearance: Normal appearance.  Cardiovascular:     Rate and Rhythm: Normal rate and regular rhythm.  Pulmonary:     Effort: Pulmonary effort is normal.     Breath sounds: Normal breath sounds.  Musculoskeletal:        General: Normal range of motion.  Skin:    General: Skin is warm and dry.     Capillary Refill: Capillary refill takes less than 2 seconds.  Neurological:     General: No focal deficit present.     Mental Status: She is alert and oriented to person, place, and time.  Psychiatric:        Mood and Affect: Mood normal.        Behavior: Behavior normal.        Thought Content: Thought content normal.        Judgment: Judgment normal.       Assessment & Plan:   1. Diabetes mellitus type 2 in obese (HCC) - Will increase to 5 mg weekly.  - Follow up in 1 month for further titration  - tirzepatide [facility]) 5 MG/0.5ML Pen; Inject 5 mg into the skin once a week.  Dispense: 2 mL; Refill: 0  2. Essential hypertension, benign - Well controlled.  - No change in medications   Dorothyann Peng, NP

## 2022-05-25 ENCOUNTER — Ambulatory Visit: Payer: Medicare HMO | Admitting: Adult Health

## 2022-05-28 ENCOUNTER — Ambulatory Visit: Payer: Medicare HMO | Admitting: Adult Health

## 2022-05-31 ENCOUNTER — Ambulatory Visit (INDEPENDENT_AMBULATORY_CARE_PROVIDER_SITE_OTHER): Payer: Medicare HMO

## 2022-05-31 ENCOUNTER — Encounter: Payer: Self-pay | Admitting: Orthopedic Surgery

## 2022-05-31 ENCOUNTER — Ambulatory Visit: Payer: Medicare Other

## 2022-05-31 ENCOUNTER — Ambulatory Visit: Payer: Medicare HMO | Admitting: Orthopedic Surgery

## 2022-05-31 VITALS — BP 123/84 | HR 80 | Ht 66.0 in | Wt 207.0 lb

## 2022-05-31 DIAGNOSIS — M545 Low back pain, unspecified: Secondary | ICD-10-CM

## 2022-05-31 DIAGNOSIS — G8929 Other chronic pain: Secondary | ICD-10-CM | POA: Diagnosis not present

## 2022-05-31 NOTE — Progress Notes (Signed)
Orthopedic Spine Surgery Office Note  Assessment: Patient is a 62 y.o. female with low back pain that radiated into her right buttock, right posterior hip and right calf.  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 Discussed that the conservative treatments include:  -activity modification  -physical therapy  -over the counter pain medications  -medrol dosepak  -lumbar steroid injections -Since she is doing better with the current treatment, recommended no further treatment at this time -I did provide her with a handout of core exercises that I said she should do 3-4 times per week since she has a history of on and off again back pain -I also informed her that weight loss would likely help with some of her back pain.  She currently has a BMI of 33.  She is working on that right now with medication and exercise -Patient should return to office on as needed basis   Patient expressed understanding of the plan and all questions were answered to the patient's satisfaction.   ___________________________________________________________________________   History:  Patient is a 62 y.o. female who presents today for lumbar spine.  Patient has a history of L5/S1 right-sided hemilaminotomy with microdiscectomy.  This was in 1995.  She has had some off and on pain in her low back since that time.  Within the couple weeks, she has noticed a flare of pain that starts in her low back and radiates down the posterior aspect of her right leg.  It goes into the buttock, posterior thigh, and calf on the right side.  She has no left-sided symptoms.  She saw her primary care provider who recommended physical therapy and a muscle relaxer.  With this treatment, she has noted significant improvement.  She is not having much pain at this time.  Her pain is  tolerable.  When the pain did occur, it was worse with activity and improved with rest.  Denies paresthesias and numbness.   Weakness: Denies Symptoms of imbalance: Denies Paresthesias and numbness: Denies Bowel or bladder incontinence: Denies Saddle anesthesia: Denies  Treatments tried: Physical therapy, Flexeril  Review of systems: Denies fevers and chills, night sweats, unexplained weight loss, history of cancer, pain that wakes them at night  Past medical history: Hyperlipidemia Hypertension GERD Diabetes (last A1C was 6.4 on 04/02/2022)  Allergies: oxycodone (itchy)  Past surgical history:  L5/S1 microdiscectomy  Social history: Denies use of nicotine product (smoking, vaping, patches, smokeless) Alcohol use: Yes, 1-2 drinks per week Denies recreational drug use   Physical Exam:  General: no acute distress, appears stated age Neurologic: alert, answering questions appropriately, following commands Respiratory: unlabored breathing on room air, symmetric chest rise Psychiatric: appropriate affect, normal cadence to speech   MSK (spine):  -Strength exam      Left  Right EHL    5/5  5/5 TA    5/5  5/5 GSC    5/5  5/5 Knee extension  5/5  5/5 Hip flexion   5/5  5/5  -Sensory exam    Sensation intact to light touch in L3-S1 nerve distributions of bilateral lower extremities  -Achilles DTR: 2/4 on the left, 2/4 on the right -Patellar tendon DTR: 2/4 on the left, 2/4 on the right  -Straight leg raise: Negative -Contralateral straight leg raise: Negative -Femoral nerve stretch test: Negative bilaterally -Clonus: no beats bilaterally  -Left hip exam:  No pain through range of motion, negative Stinchfield, negative Corky Sox -Right hip exam: No pain through range of motion, negative Stinchfield, negative FABER  Imaging: X-ray of the lumbar spine from 05/31/2022 and 02/12/2022 was independently reviewed and interpreted, showing disc height loss with anterior  osteophyte formation at L5/S1.  No instability seen on flexion/extension views.  No fracture seen.  MRI of the lumbar spine from 04/25/2022 was independently reviewed and interpreted, showing right-sided hemilaminotomy defect.  Foraminal stenosis bilaterally at L5-S1.  Disc height loss with Modic changes at L5/S1.  No other significant stenosis seen.   Patient name: Kristin Merritt Patient MRN: [id number] Date of visit: 05/31/22

## 2022-06-16 ENCOUNTER — Ambulatory Visit (INDEPENDENT_AMBULATORY_CARE_PROVIDER_SITE_OTHER): Payer: Medicare HMO | Admitting: Adult Health

## 2022-06-16 ENCOUNTER — Encounter: Payer: Self-pay | Admitting: Adult Health

## 2022-06-16 VITALS — BP 110/80 | HR 79 | Temp 98.4°F | Ht 66.0 in | Wt 195.0 lb

## 2022-06-16 DIAGNOSIS — E669 Obesity, unspecified: Secondary | ICD-10-CM

## 2022-06-16 DIAGNOSIS — I10 Essential (primary) hypertension: Secondary | ICD-10-CM | POA: Diagnosis not present

## 2022-06-16 DIAGNOSIS — E1169 Type 2 diabetes mellitus with other specified complication: Secondary | ICD-10-CM

## 2022-06-16 MED ORDER — TIRZEPATIDE 7.5 MG/0.5ML ~~LOC~~ SOAJ: 7.5000 mg | SUBCUTANEOUS | 0 refills | Status: DC

## 2022-06-16 NOTE — Progress Notes (Signed)
Subjective:    Patient ID: Kristin Merritt, female    DOB: 1960-08-13, 61 y.o.   MRN: [id number]  HPI  62 year old female who  has a past medical history of Allergy, Anemia, Anxiety, Asthma, Constipation, Depression, GERD (gastroesophageal reflux disease), Heart murmur, High cholesterol, Hypertension, Leg cramps, Lichen sclerosus et atrophicus, Numbness and tingling in hands, Postmenopausal HRT (hormone replacement therapy) - followed by Dr. Toney Rakes in gyn (07/11/2012), Swelling of both hands, Uterine cancer ([place]) (1991), UTI (urinary tract infection), VIN III (vulvar intraepithelial neoplasia III), and VIN III (vulvar intraepithelial neoplasia III).  She presents to the office today for follow-up regarding diabetes and hypertension  In November 2023 she was started on [facility].  Currently on 5 mg weekly.  She has been tolerating this medication well with no side effects. She is exercising three times a week and eating healthier. She is abe to wear clothes that she has not been able to wear in many years.  Lab Results  Component Value Date   HGBA1C 6.4 04/02/2022   Wt Readings from Last 5 Encounters:  06/16/22 195 lb (88.5 kg)  05/31/22 207 lb (93.9 kg)  05/12/22 207 lb (93.9 kg)  04/26/22 209 lb (94.8 kg)  04/09/22 211 lb (95.7 kg)  ] Hypertension-managed with Hyzaar 100-25 mg daily.  She denies dizziness, lightheadedness, blurred vision, or chest pain BP Readings from Last 3 Encounters:  06/16/22 110/80  05/31/22 123/84  05/12/22 122/72    Review of Systems See HPI   Past Medical History:  Diagnosis Date   Allergy    Anemia    as child   Anxiety    Asthma    Constipation    Depression    GERD (gastroesophageal reflux disease)    Heart murmur    High cholesterol    Hypertension    Leg cramps    Lichen sclerosus et atrophicus    Numbness and tingling in hands    Postmenopausal HRT (hormone replacement therapy) - followed by Dr. Toney Rakes in gyn 07/11/2012   Swelling  of both hands    Uterine cancer ([place]) 1991   UTI (urinary tract infection)    VIN III (vulvar intraepithelial neoplasia III)    left labia majora   VIN III (vulvar intraepithelial neoplasia III)     Social History   Socioeconomic History   Marital status: Widowed    Spouse name: Not on file   Number of children: Not on file   Years of education: Not on file   Highest education level: 12th grade  Occupational History   Not on file  Tobacco Use   Smoking status: Former    Packs/day: 0.25    Years: 20.00    Total pack years: 5.00    Types: Cigarettes    Quit date: 02/21/2017    Years since quitting: 5.3   Smokeless tobacco: Never  Vaping Use   Vaping Use: Never used  Substance and Sexual Activity   Alcohol use: Not Currently   Drug use: No   Sexual activity: Not Currently    Partners: Male    Birth control/protection: Surgical    Comment: 1st intercourse- 17, partners- 53, hysterectomy  Other Topics Concern   Not on file  Social History Narrative   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 Enjoys walking   Diet: Tries to watch what she eats. Does not eat a lot of processed foods or fast food.    Social Determinants of Health   Financial Resource Strain: Low Risk  (02/19/2022)   Overall Financial Resource Strain (CARDIA)    Difficulty of Paying Living Expenses: Not hard at all  Food Insecurity: No Food Insecurity (02/19/2022)   Hunger Vital Sign    Worried About Running Out of Food in the Last Year: Never true    Ran Out of Food in the Last Year: Never true  Transportation Needs: No Transportation Needs (02/19/2022)   PRAPARE - Hydrologist (Medical): No    Lack of Transportation (Non-Medical): No  Physical Activity: Sufficiently Active (02/19/2022)   Exercise Vital Sign    Days of Exercise per Week: 5 days    Minutes of  Exercise per Session: 30 min  Stress: No Stress Concern Present (02/19/2022)   [place]    Feeling of Stress : Not at all  Social Connections: Unknown (02/19/2022)   Social Connection and Isolation Panel [NHANES]    Frequency of Communication with Friends and Family: More than three times a week    Frequency of Social Gatherings with Friends and Family: Once a week    Attends Religious Services: Patient refused    Active Member of Clubs or Organizations: Yes    Attends Archivist Meetings: Patient refused    Marital Status: Widowed  Intimate Partner Violence: Not At Risk (05/01/2020)   Humiliation, Afraid, Rape, and Kick questionnaire    Fear of Current or Ex-Partner: No    Emotionally Abused: No    Physically Abused: No    Sexually Abused: No    Past Surgical History:  Procedure Laterality Date   BREAST EXCISIONAL BIOPSY Left 08/24/2016   times 2   EYE SURGERY     "Lazy Muscle" x 2- one as child and another [place] BIOPSY N/A 11/18/2015   Procedure: WIDE LOCAL EXCISION OF VULVA and MEDIAL LEFT THIGH LESION ;  Surgeon: Terrance Mass, MD;  Location: [facility];  Service: Gynecology;  Laterality: N/A;   WISDOM TOOTH EXTRACTION      Family History  Problem Relation Age of Onset   Cancer Mother        LUNG    Breast cancer Mother        Died   Esophageal cancer Mother    Colon cancer Neg Hx    Colon polyps Neg Hx    Rectal cancer Neg Hx    Stomach cancer Neg Hx     Allergies  Allergen Reactions   Percocet [Oxycodone-Acetaminophen] Itching    Current Outpatient Medications on File Prior to Visit  Medication Sig Dispense Refill   Albuterol Sulfate (PROAIR HFA IN) Inhale into the lungs. 1-2 puffs as needed every 4-6 hours for cough and wheezing     atorvastatin (LIPITOR) 20 MG tablet TAKE 1 TABLET BY MOUTH EVERY DAY 90 tablet 0    azelastine (OPTIVAR) 0.05 % ophthalmic solution Apply to eye.     Azelastine-Fluticasone 137-50 MCG/ACT SUSP 1 puff in each nostril     cyclobenzaprine (FLEXERIL) 10 MG tablet Take 1 tablet (10 mg total) by mouth 3 (three) times daily as needed for muscle spasms. 30 tablet 0  docusate sodium (COLACE) 100 MG capsule      EPIPEN 2-PAK 0.3 MG/0.3ML SOAJ injection      FIBER ADULT GUMMIES PO Take by mouth.     ibuprofen (ADVIL) 800 MG tablet Take 1 tablet (800 mg total) by mouth 3 (three) times daily as needed. 30 tablet 2   KLOR-CON M10 10 MEQ tablet TAKE 2 TABLETS BY MOUTH DAILY 180 tablet 1   lansoprazole (PREVACID) 30 MG capsule TAKE 1 CAPSULE BY MOUTH ONCE DAILY ONCE A DAY 90 capsule 3   levocetirizine (XYZAL) 5 MG tablet Take 5 mg by mouth every evening.      losartan-hydrochlorothiazide (HYZAAR) 100-25 MG tablet TAKE 1 TABLET BY MOUTH EVERY DAY 90 tablet 3   metFORMIN (GLUCOPHAGE) 500 MG tablet TAKE 1 TABLET BY MOUTH TWICE A DAY WITH FOOD 180 tablet 0   montelukast (SINGULAIR) 10 MG tablet Take 10 mg by mouth at bedtime.     potassium chloride SA (KLOR-CON M20) 20 MEQ tablet TAKE 1 TABLET BY MOUTH EVERY DAY 90 tablet 0   VITAMIN D PO Take 400 Int'l Units by mouth.     No current facility-administered medications on file prior to visit.    BP 110/80   Pulse 79   Temp 98.4 F (36.9 C) (Oral)   Ht '5\' 6"'$  (1.676 m)   Wt 195 lb (88.5 kg)   SpO2 98%   BMI 31.47 kg/m       Objective:   Physical Exam Vitals and nursing note reviewed.  Constitutional:      Appearance: She is obese.  Cardiovascular:     Rate and Rhythm: Normal rate and regular rhythm.     Pulses: Normal pulses.     Heart sounds: Normal heart sounds.  Pulmonary:     Effort: Pulmonary effort is normal.     Breath sounds: Normal breath sounds.  Skin:    General: Skin is warm and dry.  Neurological:     General: No focal deficit present.     Mental Status: She is oriented to person, place, and time.  Psychiatric:         Mood and Affect: Mood normal.        Behavior: Behavior normal.        Thought Content: Thought content normal.        Assessment & Plan:  1. Diabetes mellitus type 2 in obese [facility]) - She is doing great! Keep up with lifestyle modifications and follow up in one month for CPE or sooner if needed - tirzepatide (MOUNJARO) 7.5 MG/0.5ML Pen; Inject 7.5 mg into the skin once a week.  Dispense: 2 mL; Refill: 0  2. Essential hypertension, benign - well controlled.  - No change in medications  Dorothyann Peng, NP

## 2022-06-20 ENCOUNTER — Other Ambulatory Visit: Payer: Self-pay | Admitting: Adult Health

## 2022-06-20 DIAGNOSIS — I10 Essential (primary) hypertension: Secondary | ICD-10-CM

## 2022-06-23 ENCOUNTER — Telehealth: Payer: Self-pay

## 2022-06-23 ENCOUNTER — Ambulatory Visit: Payer: Medicare Other

## 2022-06-23 ENCOUNTER — Other Ambulatory Visit: Payer: Self-pay | Admitting: Adult Health

## 2022-06-23 ENCOUNTER — Encounter: Payer: Self-pay | Admitting: Adult Health

## 2022-06-23 NOTE — Telephone Encounter (Signed)
Contacted patient on preferred number listed in notes for scheduled AWV. Patient stated Insurance company came to her home and completed this visit last month.

## 2022-06-23 NOTE — Telephone Encounter (Signed)
Please advise 

## 2022-06-30 ENCOUNTER — Other Ambulatory Visit: Payer: Self-pay | Admitting: Adult Health

## 2022-06-30 DIAGNOSIS — E785 Hyperlipidemia, unspecified: Secondary | ICD-10-CM

## 2022-07-06 ENCOUNTER — Ambulatory Visit: Payer: Medicare HMO | Admitting: Adult Health

## 2022-07-06 ENCOUNTER — Telehealth: Payer: Self-pay | Admitting: Adult Health

## 2022-07-06 ENCOUNTER — Other Ambulatory Visit: Payer: Self-pay

## 2022-07-06 DIAGNOSIS — E669 Obesity, unspecified: Secondary | ICD-10-CM

## 2022-07-06 MED ORDER — TIRZEPATIDE 7.5 MG/0.5ML ~~LOC~~ SOAJ
7.5000 mg | SUBCUTANEOUS | 0 refills | Status: DC
  Filled 2022-07-08: qty 2, 28d supply, fill #0

## 2022-07-06 NOTE — Telephone Encounter (Signed)
Rx refilled.

## 2022-07-06 NOTE — Telephone Encounter (Addendum)
Pt needs her Mounjaro prescription called in so she will have a Friday dose this week.  Tommi Rumps is out sick today so she will be seen next Thursday.  Patient is requesting a call once it is called in.

## 2022-07-07 ENCOUNTER — Ambulatory Visit (INDEPENDENT_AMBULATORY_CARE_PROVIDER_SITE_OTHER): Payer: Medicare HMO | Admitting: Family Medicine

## 2022-07-07 ENCOUNTER — Encounter: Payer: Self-pay | Admitting: Family Medicine

## 2022-07-07 VITALS — BP 124/76 | HR 68 | Temp 97.9°F | Ht 66.0 in | Wt 192.2 lb

## 2022-07-07 DIAGNOSIS — K219 Gastro-esophageal reflux disease without esophagitis: Secondary | ICD-10-CM

## 2022-07-07 NOTE — Patient Instructions (Signed)
Consider adding Pepcid 20 mg one at night  Follow up with Tommi Rumps if symptoms persist  Suspect at least some of your symptoms might be related to the [facility].

## 2022-07-07 NOTE — Progress Notes (Signed)
Established Patient Office Visit  Subjective   Patient ID: Kristin Merritt, female    DOB: 1960/06/10  Age: 62 y.o. MRN: [id number]  Chief Complaint  Patient presents with   Gastroesophageal Reflux    Patient complains of GERD,x6 days     HPI   Ms. Swoveland is seen with increased belching and possibly some increased GERD symptoms over the past week or so.  She has had occasional sore throat symptoms.  No regular nonsteroidal use.  Rare alcohol.  Does drink some caffeine but relatively minimal.  She did start on Mounjaro 10 weeks ago.  Is having good success overall.  Having some expected weight loss but no dysphagia.  Denies any recent nausea or emesis.  No melena.  Symptoms are usually worse after eating and tend to be worse at night.  She already takes Prevacid 30 mg daily in the mornings.  She recently took extra dose of Prevacid at night which seemed to help.  Past Medical History:  Diagnosis Date   Allergy    Anemia    as child   Anxiety    Asthma    Constipation    Depression    GERD (gastroesophageal reflux disease)    Heart murmur    High cholesterol    Hypertension    Leg cramps    Lichen sclerosus et atrophicus    Numbness and tingling in hands    Postmenopausal HRT (hormone replacement therapy) - followed by Dr. 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 Her smoking use included cigarettes. She has a 5.00 pack-year smoking history. She has never used smokeless tobacco. She reports that she does not currently use alcohol. She reports that she does not use drugs. family history includes Breast cancer in her mother; Cancer in her mother; Esophageal cancer in her mother. Allergies  Allergen Reactions   Percocet [Oxycodone-Acetaminophen] Itching    Review of Systems  Constitutional:  Negative for chills and fever.  Gastrointestinal:  Positive for heartburn. Negative for abdominal pain, blood in stool, melena, nausea and vomiting.      Objective:     BP 124/76 (BP Location: Left Arm, Patient Position: Sitting, Cuff Size: Normal)   Pulse 68   Temp 97.9 F (36.6 C) (Oral)   Ht 5' 6"$  (1.676 m)   Wt 192 lb 3.2 oz (87.2 kg)   SpO2 97%   BMI 31.02 kg/m  BP Readings from Last 3 Encounters:  07/07/22 124/76  06/16/22 110/80  05/31/22 123/84   Wt Readings from Last 3 Encounters:  07/07/22 192 lb 3.2 oz (87.2 kg)  06/16/22 195 lb (88.5 kg)  05/31/22 207 lb (93.9 kg)      Physical Exam Vitals reviewed.  Constitutional:      Appearance: Normal appearance.  Cardiovascular:     Rate and Rhythm: Normal rate and regular rhythm.  Pulmonary:     Effort: Pulmonary effort is normal.     Breath sounds: Normal breath sounds.  Abdominal:     General: Bowel sounds are normal. There is no distension.     Palpations: Abdomen is soft. There is no mass.     Tenderness: There is no abdominal tenderness. There is no guarding or rebound.  Neurological:     Mental Status: She is alert.      No results found for any visits on 07/07/22.    The 10-year ASCVD risk score (Arnett DK, et al., 2019) is: 22.3%    Assessment & Plan:   Increased GERD symptoms with frequent belching.  We discussed the fact that recent initiation of GLP-1 medication probably is  contributing somewhat.  Fortunately, she is not having any nausea or vomiting.  -Discussed dietary management of GERD -Continue present 30 mg in the morning -Consider adding over-the-counter Pepcid 20 mg at night -She is already reducing portion sizes because of her Mounjaro -Avoid eating within 3 hours of bedtime if possible   Carolann Littler, MD

## 2022-07-08 ENCOUNTER — Other Ambulatory Visit: Payer: Self-pay | Admitting: Adult Health

## 2022-07-08 ENCOUNTER — Other Ambulatory Visit (HOSPITAL_COMMUNITY): Payer: Self-pay

## 2022-07-08 DIAGNOSIS — E785 Hyperlipidemia, unspecified: Secondary | ICD-10-CM

## 2022-07-08 DIAGNOSIS — E669 Obesity, unspecified: Secondary | ICD-10-CM

## 2022-07-09 ENCOUNTER — Telehealth: Payer: Self-pay | Admitting: Adult Health

## 2022-07-09 NOTE — Telephone Encounter (Signed)
Spoke with patient to schedule her AWV.  She stated insurance came to home to do awv.  She will bring information to provider at next appt

## 2022-07-11 ENCOUNTER — Encounter: Payer: Self-pay | Admitting: Adult Health

## 2022-07-13 NOTE — Telephone Encounter (Signed)
Called pt to set visit up pt declined. Pt stated that she has an appt Thursday and her issue can wait until then.

## 2022-07-15 ENCOUNTER — Other Ambulatory Visit (HOSPITAL_COMMUNITY): Payer: Self-pay

## 2022-07-15 ENCOUNTER — Ambulatory Visit (INDEPENDENT_AMBULATORY_CARE_PROVIDER_SITE_OTHER): Payer: Medicare HMO | Admitting: Adult Health

## 2022-07-15 ENCOUNTER — Encounter: Payer: Self-pay | Admitting: Adult Health

## 2022-07-15 VITALS — BP 120/70 | HR 73 | Temp 98.2°F | Ht 66.0 in | Wt 186.0 lb

## 2022-07-15 DIAGNOSIS — E1169 Type 2 diabetes mellitus with other specified complication: Secondary | ICD-10-CM

## 2022-07-15 DIAGNOSIS — E669 Obesity, unspecified: Secondary | ICD-10-CM | POA: Diagnosis not present

## 2022-07-15 DIAGNOSIS — I10 Essential (primary) hypertension: Secondary | ICD-10-CM

## 2022-07-15 LAB — POCT GLYCOSYLATED HEMOGLOBIN (HGB A1C): Hemoglobin A1C: 5.6 % (ref 4.0–5.6)

## 2022-07-15 MED ORDER — TIRZEPATIDE 7.5 MG/0.5ML ~~LOC~~ SOAJ
7.5000 mg | SUBCUTANEOUS | 1 refills | Status: DC
  Filled 2022-07-15 – 2022-08-05 (×2): qty 2, 28d supply, fill #0
  Filled 2022-09-14: qty 2, 28d supply, fill #1

## 2022-07-15 NOTE — Patient Instructions (Signed)
Your A1c was 5.6! Great job!  I am going to keep you on Mounjaro 7.5 mg - take every 10 days  Follow up in 3 months

## 2022-07-15 NOTE — Progress Notes (Signed)
Subjective:    Patient ID: Kristin Merritt, female    DOB: 1961-03-21, 62 y.o.   MRN: [id number]  HPI 62 year old female who  has a past medical history of Allergy, Anemia, Anxiety, Asthma, Constipation, Depression, GERD (gastroesophageal reflux disease), Heart murmur, High cholesterol, Hypertension, Leg cramps, Lichen sclerosus et atrophicus, Numbness and tingling in hands, Postmenopausal HRT (hormone replacement therapy) - followed by Dr. Toney Rakes in gyn (07/11/2012), Swelling of both hands, Uterine cancer ([place]) (1991), UTI (urinary tract infection), VIN III (vulvar intraepithelial neoplasia III), and VIN III (vulvar intraepithelial neoplasia III).  She presents to the office today for follow-up regarding diabetes and hypertension  In November 2023 she was started on [facility].  Currently on 7.5 mg weekly.  She has been tolerating this medication well with minimal nausea. She is exercising three times a week and eating healthier. She is abe to wear clothes that she has not been able to wear in many years.  Lab Results  Component Value Date   HGBA1C 6.4 04/02/2022   Hypertension-managed with Hyzaar 100-25 mg daily. Over the last month she started to feel dizzy and lightheaded and her readings at home were low. She stopped her medications  BP Readings from Last 3 Encounters:  07/15/22 120/70  07/07/22 124/76  06/16/22 110/80   Review of Systems See HPI   Past Medical History:  Diagnosis Date   Allergy    Anemia    as child   Anxiety    Asthma    Constipation    Depression    GERD (gastroesophageal reflux disease)    Heart murmur    High cholesterol    Hypertension    Leg cramps    Lichen sclerosus et atrophicus    Numbness and tingling in hands    Postmenopausal HRT (hormone replacement therapy) - followed by Dr. Toney Rakes in gyn 07/11/2012   Swelling of both hands    Uterine cancer ([place]) 1991   UTI (urinary tract infection)    VIN III (vulvar intraepithelial neoplasia III)     left labia majora   VIN III (vulvar intraepithelial neoplasia III)     Social History   Socioeconomic History   Marital status: Widowed    Spouse name: Not on file   Number of children: Not on file   Years of education: Not on file   Highest education level: 12th grade  Occupational History   Not on file  Tobacco Use   Smoking status: Former    Packs/day: 0.25    Years: 20.00    Total pack years: 5.00    Types: Cigarettes    Quit date: 02/21/2017    Years since quitting: 5.3   Smokeless tobacco: Never  Vaping Use   Vaping Use: Never used  Substance and Sexual Activity   Alcohol use: Not Currently   Drug use: No   Sexual activity: Not Currently    Partners: Male    Birth control/protection: Surgical    Comment: 1st intercourse- 17, partners- 87, hysterectomy  Other Topics Concern   Not on file  Social History Narrative   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 Enjoys walking   Diet: Tries to watch what she eats. Does not eat a lot of processed foods or fast food.    Social  Determinants of Health   Financial Resource Strain: Low Risk  (02/19/2022)   Overall Financial Resource Strain (CARDIA)    Difficulty of Paying Living Expenses: Not hard at all  Food Insecurity: No Food Insecurity (02/19/2022)   Hunger Vital Sign    Worried About Running Out of Food in the Last Year: Never true    Ran Out of Food in the Last Year: Never true  Transportation Needs: No Transportation Needs (02/19/2022)   PRAPARE - Hydrologist (Medical): No    Lack of Transportation (Non-Medical): No  Physical Activity: Sufficiently Active (02/19/2022)   Exercise Vital Sign    Days of Exercise per Week: 5 days    Minutes of Exercise per Session: 30 min  Stress: No Stress Concern Present (02/19/2022)   [place]    Feeling of Stress : Not at all  Social Connections: Unknown (02/19/2022)   Social Connection and Isolation Panel [NHANES]    Frequency of Communication with Friends and Family: More than three times a week    Frequency of Social Gatherings with Friends and Family: Once a week    Attends Religious Services: Patient refused    Active Member of Clubs or Organizations: Yes    Attends Archivist Meetings: Patient refused    Marital Status: Widowed  Intimate Partner Violence: Not At Risk (05/01/2020)   Humiliation, Afraid, Rape, and Kick questionnaire    Fear of Current or Ex-Partner: No    Emotionally Abused: No    Physically Abused: No    Sexually Abused: No    Past Surgical History:  Procedure Laterality Date   BREAST EXCISIONAL BIOPSY Left 08/24/2016   times 2   EYE SURGERY     "Lazy Muscle" x 2- one as child and another [place] BIOPSY N/A 11/18/2015   Procedure: WIDE LOCAL EXCISION OF VULVA and MEDIAL LEFT THIGH LESION ;  Surgeon: Terrance Mass, MD;  Location: [facility];  Service: Gynecology;  Laterality: N/A;   WISDOM TOOTH EXTRACTION      Family History  Problem Relation Age of Onset   Cancer Mother        LUNG    Breast cancer Mother        Died   Esophageal cancer Mother    Colon cancer Neg Hx    Colon polyps Neg Hx    Rectal cancer Neg Hx    Stomach cancer Neg Hx     Allergies  Allergen Reactions   Percocet [Oxycodone-Acetaminophen] Itching    Current Outpatient Medications on File Prior to Visit  Medication Sig Dispense Refill   Albuterol Sulfate (PROAIR HFA IN) Inhale into the lungs. 1-2 puffs as needed every 4-6 hours for cough and wheezing     atorvastatin (LIPITOR) 20 MG tablet TAKE 1 TABLET BY MOUTH EVERY DAY 90 tablet 0   azelastine (OPTIVAR) 0.05 % ophthalmic solution Apply to eye.     Azelastine-Fluticasone 137-50 MCG/ACT SUSP 1 puff in each nostril      cyclobenzaprine (FLEXERIL) 10 MG tablet Take 1 tablet (10 mg total) by mouth 3 (three) times daily as needed for muscle spasms. 30 tablet 0   docusate sodium (COLACE) 100 MG capsule      EPIPEN 2-PAK 0.3 MG/0.3ML SOAJ injection      FIBER ADULT GUMMIES PO Take  by mouth.     KLOR-CON M10 10 MEQ tablet TAKE 2 TABLETS BY MOUTH DAILY 180 tablet 1   KLOR-CON M20 20 MEQ tablet TAKE 1 TABLET BY MOUTH EVERY DAY 90 tablet 0   lansoprazole (PREVACID) 30 MG capsule TAKE 1 CAPSULE BY MOUTH ONCE DAILY ONCE A DAY 90 capsule 3   levocetirizine (XYZAL) 5 MG tablet Take 5 mg by mouth every evening.      losartan-hydrochlorothiazide (HYZAAR) 100-25 MG tablet TAKE 1 TABLET BY MOUTH EVERY DAY 90 tablet 3   metFORMIN (GLUCOPHAGE) 500 MG tablet TAKE 1 TABLET BY MOUTH TWICE A DAY WITH FOOD 180 tablet 0   montelukast (SINGULAIR) 10 MG tablet Take 10 mg by mouth at bedtime.     tirzepatide (MOUNJARO) 7.5 MG/0.5ML Pen Inject 7.5 mg into the skin once a week. 2 mL 0   VITAMIN D PO Take 400 Int'l Units by mouth.     No current facility-administered medications on file prior to visit.    BP 120/70   Pulse 73   Temp 98.2 F (36.8 C) (Oral)   Ht 5' 6"$  (1.676 m)   Wt 186 lb (84.4 kg)   SpO2 96%   BMI 30.02 kg/m       Objective:   Physical Exam Vitals and nursing note reviewed.  Constitutional:      Appearance: Normal appearance. She is obese.  Cardiovascular:     Rate and Rhythm: Normal rate and regular rhythm.     Pulses: Normal pulses.     Heart sounds: Normal heart sounds.  Pulmonary:     Effort: Pulmonary effort is normal.     Breath sounds: Normal breath sounds.  Skin:    General: Skin is warm and dry.  Neurological:     General: No focal deficit present.     Mental Status: She is alert and oriented to person, place, and time.  Psychiatric:        Mood and Affect: Mood normal.        Behavior: Behavior normal.        Thought Content: Thought content normal.        Judgment: Judgment normal.        Assessment & Plan:  1. Diabetes mellitus type 2 in obese (HCC)  - POC HgB A1c- 5.6  - She has lost 25 pounds. I am going to keep her on Mounjaro 7.5 mg for the time being  - Follow up in 3 months   2. Essential hypertension, benign - well controlled without medication   Dorothyann Peng, NP

## 2022-07-21 ENCOUNTER — Ambulatory Visit: Payer: Medicare HMO | Admitting: Adult Health

## 2022-08-05 ENCOUNTER — Other Ambulatory Visit (HOSPITAL_COMMUNITY): Payer: Self-pay

## 2022-08-05 ENCOUNTER — Other Ambulatory Visit: Payer: Self-pay

## 2022-09-05 ENCOUNTER — Encounter: Payer: Self-pay | Admitting: Adult Health

## 2022-09-07 ENCOUNTER — Encounter: Payer: Self-pay | Admitting: Adult Health

## 2022-09-07 ENCOUNTER — Telehealth (INDEPENDENT_AMBULATORY_CARE_PROVIDER_SITE_OTHER): Payer: Medicare HMO | Admitting: Adult Health

## 2022-09-07 VITALS — BP 115/75 | HR 90 | Temp 97.8°F | Ht 66.0 in | Wt 181.0 lb

## 2022-09-07 DIAGNOSIS — U071 COVID-19: Secondary | ICD-10-CM

## 2022-09-07 NOTE — Telephone Encounter (Signed)
Pt had a virtual visit this morning to discuss. No further action needed.

## 2022-09-07 NOTE — Progress Notes (Signed)
Virtual Visit via Video Note  I connected with Kristin Merritt on 09/07/22 at  7:00 AM EDT by a video enabled telemedicine application and verified that I am speaking with the correct person using two identifiers.  Location patient: home Location provider:work or home office Persons participating in the virtual visit: patient, provider  I discussed the limitations of evaluation and management by telemedicine and the availability of in person appointments. The patient expressed understanding and agreed to proceed.   HPI: 62 year female who  has a past medical history of Allergy, Anemia, Anxiety, Asthma, Constipation, Depression, GERD (gastroesophageal reflux disease), Heart murmur, High cholesterol, Hypertension, Leg cramps, Lichen sclerosus et atrophicus, Numbness and tingling in hands, Postmenopausal HRT (hormone replacement therapy) - followed by Dr. Lily Peer in gyn (07/11/2012), Swelling of both hands, Uterine cancer (1991), UTI (urinary tract infection), VIN III (vulvar intraepithelial neoplasia III), and VIN III (vulvar intraepithelial neoplasia III).  She tested positive for Covid 19 three days ago her symptoms started three days prior. Her symptoms started with cold chills and then progressed into chest congestion with cough and nasal congestion.She did have a single day of fever up to 100.3 She is using Dayquil and Nyquil and her symptoms seem to be improving.    ROS: See pertinent positives and negatives per HPI.  Past Medical History:  Diagnosis Date   Allergy    Anemia    as child   Anxiety    Asthma    Constipation    Depression    GERD (gastroesophageal reflux disease)    Heart murmur    High cholesterol    Hypertension    Leg cramps    Lichen sclerosus et atrophicus    Numbness and tingling in hands    Postmenopausal HRT (hormone replacement therapy) - followed by Dr. Lily Peer in gyn 07/11/2012   Swelling of both hands    Uterine cancer 1991   UTI (urinary tract  infection)    VIN III (vulvar intraepithelial neoplasia III)    left labia majora   VIN III (vulvar intraepithelial neoplasia III)     Past Surgical History:  Procedure Laterality Date   BREAST EXCISIONAL BIOPSY Left 08/24/2016   times 2   EYE SURGERY     "Lazy Muscle" x 2- one as child and another 1998   SPINE SURGERY     L5-s1   VAGINAL HYSTERECTOMY     VULVA /PERINEUM BIOPSY N/A 11/18/2015   Procedure: WIDE LOCAL EXCISION OF VULVA and MEDIAL LEFT THIGH LESION ;  Surgeon: Ok Edwards, MD;  Location: [facility];  Service: Gynecology;  Laterality: N/A;   WISDOM TOOTH EXTRACTION      Family History  Problem Relation Age of Onset   Cancer Mother        LUNG    Breast cancer Mother        Died   Esophageal cancer Mother    Colon cancer Neg Hx    Colon polyps Neg Hx    Rectal cancer Neg Hx    Stomach cancer Neg Hx        Current Outpatient Medications:    Albuterol Sulfate (PROAIR HFA IN), Inhale into the lungs. 1-2 puffs as needed every 4-6 hours for cough and wheezing, Disp: , Rfl:    atorvastatin (LIPITOR) 20 MG tablet, TAKE 1 TABLET BY MOUTH EVERY DAY, Disp: 90 tablet, Rfl: 0   azelastine (OPTIVAR) 0.05 % ophthalmic solution, Apply to eye., Disp: , Rfl:    Azelastine-Fluticasone  137-50 MCG/ACT SUSP, 1 puff in each nostril, Disp: , Rfl:    cyclobenzaprine (FLEXERIL) 10 MG tablet, Take 1 tablet (10 mg total) by mouth 3 (three) times daily as needed for muscle spasms., Disp: 30 tablet, Rfl: 0   docusate sodium (COLACE) 100 MG capsule, , Disp: , Rfl:    EPIPEN 2-PAK 0.3 MG/0.3ML SOAJ injection, , Disp: , Rfl:    FIBER ADULT GUMMIES PO, Take by mouth., Disp: , Rfl:    KLOR-CON M10 10 MEQ tablet, TAKE 2 TABLETS BY MOUTH DAILY, Disp: 180 tablet, Rfl: 1   KLOR-CON M20 20 MEQ tablet, TAKE 1 TABLET BY MOUTH EVERY DAY, Disp: 90 tablet, Rfl: 0   lansoprazole (PREVACID) 30 MG capsule, TAKE 1 CAPSULE BY MOUTH ONCE DAILY ONCE A DAY, Disp: 90 capsule, Rfl: 3   levocetirizine (XYZAL)  5 MG tablet, Take 5 mg by mouth every evening. , Disp: , Rfl:    losartan-hydrochlorothiazide (HYZAAR) 100-25 MG tablet, TAKE 1 TABLET BY MOUTH EVERY DAY, Disp: 90 tablet, Rfl: 3   metFORMIN (GLUCOPHAGE) 500 MG tablet, TAKE 1 TABLET BY MOUTH TWICE A DAY WITH FOOD, Disp: 180 tablet, Rfl: 0   montelukast (SINGULAIR) 10 MG tablet, Take 10 mg by mouth at bedtime., Disp: , Rfl:    tirzepatide (MOUNJARO) 7.5 MG/0.5ML Pen, Inject 7.5 mg into the skin once a week., Disp: 2 mL, Rfl: 1   VITAMIN D PO, Take 400 Int'l Units by mouth., Disp: , Rfl:   EXAM:  VITALS per patient if applicable:  GENERAL: alert, oriented, appears well and in no acute distress  HEENT: atraumatic, conjunttiva clear, no obvious abnormalities on inspection of external nose and ears  NECK: normal movements of the head and neck  LUNGS: on inspection no signs of respiratory distress, breathing rate appears normal, no obvious gross SOB, gasping or wheezing  CV: no obvious cyanosis  MS: moves all visible extremities without noticeable abnormality  PSYCH/NEURO: pleasant and cooperative, no obvious depression or anxiety, speech and thought processing grossly intact  ASSESSMENT AND PLAN:  Discussed the following assessment and plan:  1. COVID-19 virus infection - Outside of the treatment window. Symptoms seem to be mild in nature. Continue with OTC medications  - Follow up as needed      I discussed the assessment and treatment plan with the patient. The patient was provided an opportunity to ask questions and all were answered. The patient agreed with the plan and demonstrated an understanding of the instructions.   The patient was advised to call back or seek an in-person evaluation if the symptoms worsen or if the condition fails to improve as anticipated.   Shirline Frees, NP

## 2022-09-18 ENCOUNTER — Other Ambulatory Visit: Payer: Self-pay | Admitting: Adult Health

## 2022-09-18 DIAGNOSIS — E1169 Type 2 diabetes mellitus with other specified complication: Secondary | ICD-10-CM

## 2022-09-24 ENCOUNTER — Other Ambulatory Visit: Payer: Self-pay | Admitting: Adult Health

## 2022-09-24 DIAGNOSIS — E785 Hyperlipidemia, unspecified: Secondary | ICD-10-CM

## 2022-10-06 ENCOUNTER — Other Ambulatory Visit (HOSPITAL_COMMUNITY): Payer: Self-pay

## 2022-10-06 ENCOUNTER — Ambulatory Visit (INDEPENDENT_AMBULATORY_CARE_PROVIDER_SITE_OTHER): Payer: Medicare HMO | Admitting: Adult Health

## 2022-10-06 ENCOUNTER — Encounter: Payer: Self-pay | Admitting: Adult Health

## 2022-10-06 VITALS — BP 120/80 | HR 66 | Temp 98.5°F | Ht 66.0 in | Wt 186.0 lb

## 2022-10-06 DIAGNOSIS — I10 Essential (primary) hypertension: Secondary | ICD-10-CM | POA: Diagnosis not present

## 2022-10-06 DIAGNOSIS — E118 Type 2 diabetes mellitus with unspecified complications: Secondary | ICD-10-CM | POA: Diagnosis not present

## 2022-10-06 DIAGNOSIS — Z7985 Long-term (current) use of injectable non-insulin antidiabetic drugs: Secondary | ICD-10-CM | POA: Diagnosis not present

## 2022-10-06 LAB — POCT GLYCOSYLATED HEMOGLOBIN (HGB A1C): Hemoglobin A1C: 5.3 % (ref 4.0–5.6)

## 2022-10-06 MED ORDER — FREESTYLE LITE TEST VI STRP
ORAL_STRIP | 12 refills | Status: DC
  Filled 2022-10-06: qty 100, 90d supply, fill #0
  Filled 2023-04-01: qty 100, fill #0

## 2022-10-06 MED ORDER — FREESTYLE LANCETS MISC
12 refills | Status: DC
  Filled 2022-10-06: qty 100, 90d supply, fill #0
  Filled 2023-04-01: qty 100, fill #0

## 2022-10-06 NOTE — Progress Notes (Signed)
Subjective:    Patient ID: Kristin Merritt, female    DOB: 11/09/1960, 62 y.o.   MRN: [id number]  Diabetes    62 year old female who  has a past medical history of Allergy, Anemia, Anxiety, Asthma, Constipation, Depression, GERD (gastroesophageal reflux disease), Heart murmur, High cholesterol, Hypertension, Leg cramps, Lichen sclerosus et atrophicus, Numbness and tingling in hands, Postmenopausal HRT (hormone replacement therapy) - followed by Dr. Lily Peer in gyn (07/11/2012), Swelling of both hands, Uterine cancer (HCC) (1991), UTI (urinary tract infection), VIN III (vulvar intraepithelial neoplasia III), and VIN III (vulvar intraepithelial neoplasia III).  She presents to the office today for follow up regarding DM/HTN  In November 2023 she was started on [facility].  Currently on 7.5 mg weekly.  She has been tolerating this medication well with minimal nausea. She is exercising three times a week and eating healthier. She is abe to wear clothes that she has not been able to wear in many years.  Lab Results  Component Value Date   HGBA1C 5.6 07/15/2022   Wt Readings from Last 3 Encounters:  10/06/22 186 lb (84.4 kg)  09/07/22 181 lb (82.1 kg)  07/15/22 186 lb (84.4 kg)    Hypertension Managed with Hyzaar 100-25 mg daily. She has been checking her BP at home with readings in the 120-130's/70-80. She denies dizziness, lightheadedness, blurred vision.   BP Readings from Last 3 Encounters:  10/06/22 120/80  09/07/22 115/75  07/15/22 120/70     Review of Systems See HPI   Past Medical History:  Diagnosis Date   Allergy    Anemia    as child   Anxiety    Asthma    Constipation    Depression    GERD (gastroesophageal reflux disease)    Heart murmur    High cholesterol    Hypertension    Leg cramps    Lichen sclerosus et atrophicus    Numbness and tingling in hands    Postmenopausal HRT (hormone replacement therapy) - followed by Dr. Lily Peer in gyn 07/11/2012   Swelling  of both hands    Uterine cancer (HCC) 1991   UTI (urinary tract infection)    VIN III (vulvar intraepithelial neoplasia III)    left labia majora   VIN III (vulvar intraepithelial neoplasia III)     Social History   Socioeconomic History   Marital status: Widowed    Spouse name: Not on file   Number of children: Not on file   Years of education: Not on file   Highest education level: 12th grade  Occupational History   Not on file  Tobacco Use   Smoking status: Former    Packs/day: 0.25    Years: 20.00    Additional pack years: 0.00    Total pack years: 5.00    Types: Cigarettes    Quit date: 02/21/2017    Years since quitting: 5.6   Smokeless tobacco: Never  Vaping Use   Vaping Use: Never used  Substance and Sexual Activity   Alcohol use: Not Currently   Drug use: No   Sexual activity: Not Currently    Partners: Male    Birth control/protection: Surgical    Comment: 1st intercourse- 17, partners- 7, hysterectomy  Other Topics Concern   Not on file  Social History Narrative   Work or School: retired Retail banker Situation: lives alone       Spiritual Beliefs: Christain  Exercise: Has no motivation. Enjoys walking   Diet: Tries to watch what she eats. Does not eat a lot of processed foods or fast food.    Social Determinants of Health   Financial Resource Strain: Low Risk  (02/19/2022)   Overall Financial Resource Strain (CARDIA)    Difficulty of Paying Living Expenses: Not hard at all  Food Insecurity: No Food Insecurity (02/19/2022)   Hunger Vital Sign    Worried About Running Out of Food in the Last Year: Never true    Ran Out of Food in the Last Year: Never true  Transportation Needs: No Transportation Needs (02/19/2022)   PRAPARE - Administrator, Civil Service (Medical): No    Lack of Transportation (Non-Medical): No  Physical Activity: Sufficiently Active (02/19/2022)   Exercise Vital Sign    Days of Exercise per  Week: 5 days    Minutes of Exercise per Session: 30 min  Stress: No Stress Concern Present (02/19/2022)   [employer] of Occupational Health - Occupational Stress Questionnaire    Feeling of Stress : Not at all  Social Connections: Unknown (02/19/2022)   Social Connection and Isolation Panel [NHANES]    Frequency of Communication with Friends and Family: More than three times a week    Frequency of Social Gatherings with Friends and Family: Once a week    Attends Religious Services: Patient declined    Database administrator or Organizations: Yes    Attends Banker Meetings: Patient declined    Marital Status: Widowed  Intimate Partner Violence: Not At Risk (05/01/2020)   Humiliation, Afraid, Rape, and Kick questionnaire    Fear of Current or Ex-Partner: No    Emotionally Abused: No    Physically Abused: No    Sexually Abused: No    Past Surgical History:  Procedure Laterality Date   BREAST EXCISIONAL BIOPSY Left 08/24/2016   times 2   EYE SURGERY     "Lazy Muscle" x 2- one as child and another 1998   SPINE SURGERY     L5-s1   VAGINAL HYSTERECTOMY     VULVA /PERINEUM BIOPSY N/A 11/18/2015   Procedure: WIDE LOCAL EXCISION OF VULVA and MEDIAL LEFT THIGH LESION ;  Surgeon: Ok Edwards, MD;  Location: [facility];  Service: Gynecology;  Laterality: N/A;   WISDOM TOOTH EXTRACTION      Family History  Problem Relation Age of Onset   Cancer Mother        LUNG    Breast cancer Mother        Died   Esophageal cancer Mother    Colon cancer Neg Hx    Colon polyps Neg Hx    Rectal cancer Neg Hx    Stomach cancer Neg Hx     Allergies  Allergen Reactions   Percocet [Oxycodone-Acetaminophen] Itching    Current Outpatient Medications on File Prior to Visit  Medication Sig Dispense Refill   Albuterol Sulfate (PROAIR HFA IN) Inhale into the lungs. 1-2 puffs as needed every 4-6 hours for cough and wheezing     atorvastatin (LIPITOR) 20 MG tablet TAKE 1 TABLET BY  MOUTH EVERY DAY 90 tablet 0   azelastine (OPTIVAR) 0.05 % ophthalmic solution Apply to eye.     Azelastine-Fluticasone 137-50 MCG/ACT SUSP 1 puff in each nostril     cyclobenzaprine (FLEXERIL) 10 MG tablet Take 1 tablet (10 mg total) by mouth 3 (three) times daily as needed for muscle spasms. 30 tablet  0   docusate sodium (COLACE) 100 MG capsule      EPIPEN 2-PAK 0.3 MG/0.3ML SOAJ injection      FIBER ADULT GUMMIES PO Take by mouth.     KLOR-CON M10 10 MEQ tablet TAKE 2 TABLETS BY MOUTH DAILY 180 tablet 1   KLOR-CON M20 20 MEQ tablet TAKE 1 TABLET BY MOUTH EVERY DAY 90 tablet 0   lansoprazole (PREVACID) 30 MG capsule TAKE 1 CAPSULE BY MOUTH ONCE DAILY ONCE A DAY 90 capsule 3   levocetirizine (XYZAL) 5 MG tablet Take 5 mg by mouth every evening.      montelukast (SINGULAIR) 10 MG tablet Take 10 mg by mouth at bedtime.     tirzepatide (MOUNJARO) 7.5 MG/0.5ML Pen INJECT 7.5 MG SUBCUTANEOUSLY WEEKLY 6 mL 0   VITAMIN D PO Take 400 Int'l Units by mouth.     losartan-hydrochlorothiazide (HYZAAR) 100-25 MG tablet TAKE 1 TABLET BY MOUTH EVERY DAY (Patient not taking: Reported on 10/06/2022) 90 tablet 3   metFORMIN (GLUCOPHAGE) 500 MG tablet TAKE 1 TABLET BY MOUTH TWICE A DAY WITH FOOD (Patient not taking: Reported on 10/06/2022) 180 tablet 0   No current facility-administered medications on file prior to visit.    BP 120/80   Pulse 66   Temp 98.5 F (36.9 C) (Oral)   Ht 5\' 6"  (1.676 m)   Wt 186 lb (84.4 kg)   SpO2 99%   BMI 30.02 kg/m       Objective:   Physical Exam Vitals and nursing note reviewed.  Constitutional:      Appearance: Normal appearance.  Cardiovascular:     Rate and Rhythm: Normal rate and regular rhythm.     Pulses: Normal pulses.     Heart sounds: Normal heart sounds.  Pulmonary:     Effort: Pulmonary effort is normal.     Breath sounds: Normal breath sounds.  Musculoskeletal:        General: Normal range of motion.  Skin:    General: Skin is warm and dry.   Neurological:     General: No focal deficit present.     Mental Status: She is alert and oriented to person, place, and time.  Psychiatric:        Mood and Affect: Mood normal.        Behavior: Behavior normal.        Thought Content: Thought content normal.        Judgment: Judgment normal.        Assessment & Plan:  1. Controlled type 2 diabetes mellitus with complication, without long-term current use of insulin (HCC)  - POC HgB A1c- 5.3  - She is happy with her weight loss and would like to stay on Mounjar0 7.5 mg weekly.  - Will have her follow up in 6 months for her CPE   2. Essential hypertension, benign - Well controlled. No change in medications   3. Long-term current use of injectable noninsulin antidiabetic medication   Shirline Frees, NP

## 2022-10-07 ENCOUNTER — Other Ambulatory Visit (HOSPITAL_COMMUNITY): Payer: Self-pay

## 2022-10-20 ENCOUNTER — Other Ambulatory Visit (HOSPITAL_COMMUNITY): Payer: Self-pay

## 2022-10-20 LAB — HM DIABETES EYE EXAM

## 2022-11-02 ENCOUNTER — Other Ambulatory Visit (HOSPITAL_COMMUNITY): Payer: Self-pay

## 2022-12-04 ENCOUNTER — Other Ambulatory Visit: Payer: Self-pay | Admitting: Adult Health

## 2022-12-04 DIAGNOSIS — E1169 Type 2 diabetes mellitus with other specified complication: Secondary | ICD-10-CM

## 2022-12-08 ENCOUNTER — Other Ambulatory Visit: Payer: Self-pay | Admitting: Adult Health

## 2022-12-18 ENCOUNTER — Other Ambulatory Visit: Payer: Self-pay | Admitting: Adult Health

## 2022-12-18 DIAGNOSIS — E785 Hyperlipidemia, unspecified: Secondary | ICD-10-CM

## 2022-12-23 ENCOUNTER — Other Ambulatory Visit: Payer: Self-pay | Admitting: Adult Health

## 2023-01-03 ENCOUNTER — Encounter: Payer: Self-pay | Admitting: Obstetrics and Gynecology

## 2023-01-03 ENCOUNTER — Other Ambulatory Visit (HOSPITAL_COMMUNITY)
Admission: RE | Admit: 2023-01-03 | Discharge: 2023-01-03 | Disposition: A | Discharge status: Home / Self Care | Payer: Medicare HMO | Source: Ambulatory Visit | Attending: Obstetrics and Gynecology | Admitting: Obstetrics and Gynecology

## 2023-01-03 ENCOUNTER — Ambulatory Visit: Payer: Medicare HMO | Admitting: Obstetrics and Gynecology

## 2023-01-03 VITALS — BP 120/80 | HR 83 | Temp 98.7°F | Resp 16 | Wt 188.0 lb

## 2023-01-03 DIAGNOSIS — N898 Other specified noninflammatory disorders of vagina: Secondary | ICD-10-CM

## 2023-01-03 DIAGNOSIS — Z113 Encounter for screening for infections with a predominantly sexual mode of transmission: Secondary | ICD-10-CM | POA: Insufficient documentation

## 2023-01-03 DIAGNOSIS — Z114 Encounter for screening for human immunodeficiency virus [HIV]: Secondary | ICD-10-CM

## 2023-01-03 DIAGNOSIS — Z1159 Encounter for screening for other viral diseases: Secondary | ICD-10-CM

## 2023-01-03 DIAGNOSIS — N760 Acute vaginitis: Secondary | ICD-10-CM

## 2023-01-03 DIAGNOSIS — R350 Frequency of micturition: Secondary | ICD-10-CM | POA: Diagnosis not present

## 2023-01-03 LAB — URINALYSIS, COMPLETE W/RFL CULTURE
Bilirubin Urine: NEGATIVE
Glucose, UA: NEGATIVE
Hgb urine dipstick: NEGATIVE
Hyaline Cast: NONE SEEN /LPF
Ketones, ur: NEGATIVE
Leukocyte Esterase: NEGATIVE
Nitrites, Initial: NEGATIVE
Protein, ur: NEGATIVE
RBC / HPF: NONE SEEN /HPF (ref 0–2)
Specific Gravity, Urine: 1.025 (ref 1.001–1.035)
pH: 7 (ref 5.0–8.0)

## 2023-01-03 LAB — WET PREP FOR TRICH, YEAST, CLUE

## 2023-01-03 LAB — CULTURE INDICATED

## 2023-01-03 MED ORDER — FLUCONAZOLE 150 MG PO TABS: 150.0000 mg | ORAL_TABLET | Freq: Once | ORAL | 0 refills | Status: AC

## 2023-01-03 MED ORDER — METRONIDAZOLE 500 MG PO TABS: 500.0000 mg | ORAL_TABLET | Freq: Two times a day (BID) | ORAL | 0 refills | Status: DC

## 2023-01-03 MED ORDER — SULFAMETHOXAZOLE-TRIMETHOPRIM 800-160 MG PO TABS: 1.0000 | ORAL_TABLET | Freq: Two times a day (BID) | ORAL | 0 refills | Status: DC

## 2023-01-03 NOTE — Progress Notes (Signed)
GYNECOLOGY  VISIT   HPI: 62 y.o.   Widowed  [place]  female   G1P1 with No LMP recorded. Patient has had a hysterectomy.   here for vaginal itching & urinary frequency.    Having a lot of vaginal dryness and discharge, started 3 days ago.   Voiding often and small amounts.  Urgency.  No dysuria.   She took some at home Ciprofloxacin 500 mg daily for the last 3 days.  She feels better.   She took Macrobid in the past, and states it has not worked for her for UTI.   She has a new partner.   GYNECOLOGIC HISTORY: No LMP recorded. Patient has had a hysterectomy. Contraception:  hysterectomy Menopausal hormone therapy:  none Last mammogram:  02-25-21 neg Last pap smear:   06-16-21 neg        OB History     Gravida  1   Para  1   Term      Preterm      AB      Living  1      SAB      IAB      Ectopic      Multiple      Live Births                 Patient Active Problem List   Diagnosis Date Noted   Snoring 04/26/2022   Bilateral carpal tunnel syndrome 07/13/2017   LGSIL Pap smear of vagina 09/08/2016   Vulva cancer (HCC) 04/22/2016   Tobacco abuse 12/01/2015   History of uterine cancer 10/07/2015   VIN III (vulvar intraepithelial neoplasia III) 09/18/2015   Postlaminectomy syndrome, lumbar region 12/17/2014   Right lumbar radiculitis 12/17/2014   Lichen sclerosus 04/08/2014   History of vulvar dysplasia 04/08/2014   Asthma, chronic 08/21/2012   Essential hypertension, benign 08/21/2012   Hyperlipemia 08/21/2012   Anxiety 08/21/2012   Chronic low back pain 08/21/2012   Allergic rhinitis 08/21/2012   Recurrent UTI after sex, uses prophylactic cipro with diflucan for prevention yeast infection 08/21/2012   Symptoms, such as flushing, sleeplessness, headache, lack of concentration, associated with the menopause 07/11/2012   Postmenopausal HRT (hormone replacement therapy) - followed by Dr. Lily Peer in gyn 07/11/2012    Past Medical  History:  Diagnosis Date   Allergy    Anemia    as child   Anxiety    Asthma    Constipation    Depression    GERD (gastroesophageal reflux disease)    Heart murmur    High cholesterol    Hypertension    Leg cramps    Lichen sclerosus et atrophicus    Numbness and tingling in hands    Postmenopausal HRT (hormone replacement therapy) - followed by Dr. 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   VIN III (vulvar intraepithelial neoplasia III)     Past Surgical History:  Procedure Laterality Date   BREAST EXCISIONAL BIOPSY Left 08/24/2016   times 2   EYE SURGERY     "Lazy Muscle" x 2- one as child and another 1998   SPINE SURGERY     L5-s1   VAGINAL HYSTERECTOMY     VULVA /PERINEUM BIOPSY N/A 11/18/2015   Procedure: WIDE LOCAL EXCISION OF VULVA and MEDIAL LEFT THIGH  LESION ;  Surgeon: Ok Edwards, MD;  Location: [facility];  Service: Gynecology;  Laterality: N/A;   WISDOM TOOTH EXTRACTION      Current Outpatient Medications  Medication Sig Dispense Refill   Albuterol Sulfate (PROAIR HFA IN) Inhale into the lungs. 1-2 puffs as needed every 4-6 hours for cough and wheezing     atorvastatin (LIPITOR) 20 MG tablet TAKE 1 TABLET BY MOUTH EVERY DAY 90 tablet 0   Azelastine-Fluticasone 137-50 MCG/ACT SUSP 1 puff in each nostril     cyclobenzaprine (FLEXERIL) 10 MG tablet Take 1 tablet (10 mg total) by mouth 3 (three) times daily as needed for muscle spasms. 30 tablet 0   docusate sodium (COLACE) 100 MG capsule      FIBER ADULT GUMMIES PO Take by mouth.     glucose blood (FREESTYLE LITE) test strip Use as instructed 100 each 12   KLOR-CON M20 20 MEQ tablet TAKE 1 TABLET BY MOUTH EVERY DAY 90 tablet 0   Lancets (FREESTYLE) lancets Use as instructed 100 each 12   lansoprazole (PREVACID) 30 MG capsule TAKE 1 CAPSULE BY MOUTH ONCE DAILY 90  capsule 3   levocetirizine (XYZAL) 5 MG tablet Take 5 mg by mouth every evening.      losartan-hydrochlorothiazide (HYZAAR) 100-25 MG tablet TAKE 1 TABLET BY MOUTH EVERY DAY 90 tablet 3   montelukast (SINGULAIR) 10 MG tablet Take 10 mg by mouth at bedtime.     tirzepatide (MOUNJARO) 7.5 MG/0.5ML Pen INJECT 1 PEN INTO THE SKIN ONCE A WEEK 6 mL 1   VITAMIN D PO Take 400 Int'l Units by mouth.     EPIPEN 2-PAK 0.3 MG/0.3ML SOAJ injection  (Patient not taking: Reported on 01/03/2023)     No current facility-administered medications for this visit.     ALLERGIES: Percocet [oxycodone-acetaminophen]  Family History  Problem Relation Age of Onset   Cancer Mother        LUNG    Breast cancer Mother        Died   Esophageal cancer Mother    Colon cancer Neg Hx    Colon polyps Neg Hx    Rectal cancer Neg Hx    Stomach cancer Neg Hx     Social History   Socioeconomic History   Marital status: Widowed    Spouse name: Not on file   Number of children: Not on file   Years of education: Not on file   Highest education level: 12th grade  Occupational History   Not on file  Tobacco Use   Smoking status: Former    Current packs/day: 0.00    Average packs/day: 0.3 packs/day for 20.0 years (5.0 ttl pk-yrs)    Types: Cigarettes    Start date: 02/21/1997    Quit date: 02/21/2017    Years since quitting: 5.8   Smokeless tobacco: Never  Vaping Use   Vaping status: Never Used  Substance and Sexual Activity   Alcohol use: Not Currently   Drug use: No   Sexual activity: Not Currently    Partners: Male    Birth control/protection: Surgical    Comment: 1st intercourse- 17, partners- 7, hysterectomy  Other Topics Concern   Not on file  Social History Narrative   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 Enjoys  walking   Diet: Tries to watch what she eats. Does not eat a lot of  processed foods or fast food.    Social Determinants of Health   Financial Resource Strain: Low Risk  (02/19/2022)   Overall Financial Resource Strain (CARDIA)    Difficulty of Paying Living Expenses: Not hard at all  Food Insecurity: No Food Insecurity (02/19/2022)   Hunger Vital Sign    Worried About Running Out of Food in the Last Year: Never true    Ran Out of Food in the Last Year: Never true  Transportation Needs: No Transportation Needs (02/19/2022)   PRAPARE - Administrator, Civil Service (Medical): No    Lack of Transportation (Non-Medical): No  Physical Activity: Sufficiently Active (02/19/2022)   Exercise Vital Sign    Days of Exercise per Week: 5 days    Minutes of Exercise per Session: 30 min  Stress: No Stress Concern Present (02/19/2022)   [employer] of Occupational Health - Occupational Stress Questionnaire    Feeling of Stress : Not at all  Social Connections: Unknown (02/19/2022)   Social Connection and Isolation Panel [NHANES]    Frequency of Communication with Friends and Family: More than three times a week    Frequency of Social Gatherings with Friends and Family: Once a week    Attends Religious Services: Patient declined    Database administrator or Organizations: Yes    Attends Banker Meetings: Patient declined    Marital Status: Widowed  Intimate Partner Violence: Not At Risk (05/01/2020)   Humiliation, Afraid, Rape, and Kick questionnaire    Fear of Current or Ex-Partner: No    Emotionally Abused: No    Physically Abused: No    Sexually Abused: No    Review of Systems  Constitutional: Negative.   HENT: Negative.    Eyes: Negative.   Respiratory: Negative.    Cardiovascular: Negative.   Gastrointestinal: Negative.   Endocrine: Negative.   Genitourinary:  Positive for frequency.       Vaginal itching  Musculoskeletal: Negative.   Skin: Negative.   Allergic/Immunologic: Negative.   Neurological: Negative.    Psychiatric/Behavioral: Negative.      PHYSICAL EXAMINATION:    BP 120/80   Pulse 83   Temp 98.7 F (37.1 C) (Oral)   Resp 16   Wt 188 lb (85.3 kg)   BMI 30.34 kg/m     General appearance: alert, cooperative and appears stated age   Pelvic: External genitalia:  no lesions              Urethra:  normal appearing urethra with no masses, tenderness or lesions              Bartholins and Skenes: normal                 Vagina: normal appearing vagina with normal color and white discharge, no lesions              Cervix: absent                Bimanual Exam:  Uterus: absent              Adnexa: no mass, fullness, tenderness                             Anus: hemorrhoid.  Chaperone was present for exam:  declined  ASSESSMENT  Status post TVH.  Hx vulvar cancer.   Vaginitis.  Urinary frequency.  Possible partially treated UTI.  STD screening.   PLAN  UA:  sg 1.025, ph 7.0, 0 - 5 WBC, NS RBC, 6 - 10 epis, few bacteria.  UC sent.  Wet prep:  Clue cells, no yeast, no trichomonas.  GC/CT testing. HIV, RPR, Hep C aby testing.  Flagyl 500 mg po x 7 days.  Bactrim DS po bid x 3 days.  Diflucan 150 mg x 2 prn.   28 min  total time was spent for this patient encounter, including preparation, face-to-face counseling with the patient, coordination of care, and documentation of the encounter.

## 2023-01-04 LAB — CERVICOVAGINAL ANCILLARY ONLY
Chlamydia: NEGATIVE
Comment: NEGATIVE
Comment: NORMAL
Neisseria Gonorrhea: NEGATIVE

## 2023-01-04 LAB — HIV ANTIBODY (ROUTINE TESTING W REFLEX): HIV 1&2 Ab, 4th Generation: NONREACTIVE

## 2023-01-05 ENCOUNTER — Ambulatory Visit: Payer: Medicare HMO | Admitting: Adult Health

## 2023-01-05 ENCOUNTER — Encounter: Payer: Self-pay | Admitting: Adult Health

## 2023-01-05 VITALS — BP 130/80 | HR 79 | Temp 98.4°F | Ht 66.0 in | Wt 188.0 lb

## 2023-01-05 DIAGNOSIS — R600 Localized edema: Secondary | ICD-10-CM | POA: Diagnosis not present

## 2023-01-05 MED ORDER — PREDNISONE 10 MG PO TABS: 10.0000 mg | ORAL_TABLET | Freq: Every day | ORAL | 0 refills | Status: DC

## 2023-01-05 NOTE — Progress Notes (Signed)
Subjective:    Patient ID: Kristin Merritt, female    DOB: 08-01-1960, 62 y.o.   MRN: [id number]  HPI 62 year old female who  has a past medical history of Allergy, Anemia, Anxiety, Asthma, Constipation, Depression, GERD (gastroesophageal reflux disease), Heart murmur, High cholesterol, Hypertension, Leg cramps, Lichen sclerosus et atrophicus, Numbness and tingling in hands, Postmenopausal HRT (hormone replacement therapy) - followed by Dr. Lily Peer in gyn (07/11/2012), Swelling of both hands, Uterine cancer (HCC) (1991), UTI (urinary tract infection), VIN III (vulvar intraepithelial neoplasia III), and VIN III (vulvar intraepithelial neoplasia III).  She presents to the office for concern of swelling under both eyes.  She has had "bags" under her eyes for quite some time.  Recently swelling became worse and her under her eyes turned black stating "it almost look like somebody punched me".  She does get allergy shots weekly.  Denies coming in contact with any type of chemicals, new detergents, soaps, or anything else that may have caused allergic reaction   Review of Systems See HPI   Past Medical History:  Diagnosis Date   Allergy    Anemia    as child   Anxiety    Asthma    Constipation    Depression    GERD (gastroesophageal reflux disease)    Heart murmur    High cholesterol    Hypertension    Leg cramps    Lichen sclerosus et atrophicus    Numbness and tingling in hands    Postmenopausal HRT (hormone replacement therapy) - followed by Dr. 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   VIN III (vulvar intraepithelial neoplasia III)     Social History   Socioeconomic History   Marital status: Widowed    Spouse name: Not on file   Number of children: Not on file   Years of education: Not on file   Highest education level: 12th grade   Occupational History   Not on file  Tobacco Use   Smoking status: Former    Current packs/day: 0.00    Average packs/day: 0.3 packs/day for 20.0 years (5.0 ttl pk-yrs)    Types: Cigarettes    Start date: 02/21/1997    Quit date: 02/21/2017    Years since quitting: 5.8   Smokeless tobacco: Never  Vaping Use   Vaping status: Never Used  Substance and Sexual Activity   Alcohol use: Not Currently   Drug use: No   Sexual activity: Not Currently    Partners: Male    Birth control/protection: Surgical    Comment: 1st intercourse- 17, partners- 7, hysterectomy  Other Topics Concern   Not on file  Social History Narrative   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 Enjoys walking   Diet: Tries to watch what she eats. Does not eat a lot of processed foods or fast food.    Social Determinants of Health   Financial Resource Strain: Low Risk  (02/19/2022)   Overall Financial Resource Strain (CARDIA)    Difficulty of Paying Living Expenses: Not hard at all  Food Insecurity: No Food Insecurity (02/19/2022)   Hunger Vital  Sign    Worried About Programme researcher, broadcasting/film/video in the Last Year: Never true    Ran Out of Food in the Last Year: Never true  Transportation Needs: No Transportation Needs (02/19/2022)   PRAPARE - Administrator, Civil Service (Medical): No    Lack of Transportation (Non-Medical): No  Physical Activity: Sufficiently Active (02/19/2022)   Exercise Vital Sign    Days of Exercise per Week: 5 days    Minutes of Exercise per Session: 30 min  Stress: No Stress Concern Present (02/19/2022)   [employer] of Occupational Health - Occupational Stress Questionnaire    Feeling of Stress : Not at all  Social Connections: Unknown (02/19/2022)   Social Connection and Isolation Panel [NHANES]    Frequency of Communication with Friends and Family: More than three  times a week    Frequency of Social Gatherings with Friends and Family: Once a week    Attends Religious Services: Patient declined    Database administrator or Organizations: Yes    Attends Banker Meetings: Patient declined    Marital Status: Widowed  Intimate Partner Violence: Not At Risk (05/01/2020)   Humiliation, Afraid, Rape, and Kick questionnaire    Fear of Current or Ex-Partner: No    Emotionally Abused: No    Physically Abused: No    Sexually Abused: No    Past Surgical History:  Procedure Laterality Date   BREAST EXCISIONAL BIOPSY Left 08/24/2016   times 2   EYE SURGERY     "Lazy Muscle" x 2- one as child and another 1998   SPINE SURGERY     L5-s1   VAGINAL HYSTERECTOMY     VULVA /PERINEUM BIOPSY N/A 11/18/2015   Procedure: WIDE LOCAL EXCISION OF VULVA and MEDIAL LEFT THIGH LESION ;  Surgeon: Ok Edwards, MD;  Location: [facility];  Service: Gynecology;  Laterality: N/A;   WISDOM TOOTH EXTRACTION      Family History  Problem Relation Age of Onset   Cancer Mother        LUNG    Breast cancer Mother        Died   Esophageal cancer Mother    Colon cancer Neg Hx    Colon polyps Neg Hx    Rectal cancer Neg Hx    Stomach cancer Neg Hx     Allergies  Allergen Reactions   Percocet [Oxycodone-Acetaminophen] Itching    Current Outpatient Medications on File Prior to Visit  Medication Sig Dispense Refill   Albuterol Sulfate (PROAIR HFA IN) Inhale into the lungs. 1-2 puffs as needed every 4-6 hours for cough and wheezing     atorvastatin (LIPITOR) 20 MG tablet TAKE 1 TABLET BY MOUTH EVERY DAY 90 tablet 0   Azelastine-Fluticasone 137-50 MCG/ACT SUSP 1 puff in each nostril     cyclobenzaprine (FLEXERIL) 10 MG tablet Take 1 tablet (10 mg total) by mouth 3 (three) times daily as needed for muscle spasms. 30 tablet 0   docusate sodium (COLACE) 100 MG capsule      EPIPEN 2-PAK 0.3 MG/0.3ML SOAJ injection      FIBER ADULT GUMMIES PO Take by mouth.      glucose blood (FREESTYLE LITE) test strip Use as instructed 100 each 12   KLOR-CON M20 20 MEQ tablet TAKE 1 TABLET BY MOUTH EVERY DAY 90 tablet 0   Lancets (FREESTYLE) lancets Use as instructed 100 each 12   lansoprazole (PREVACID) 30 MG capsule TAKE 1  CAPSULE BY MOUTH ONCE DAILY 90 capsule 3   levocetirizine (XYZAL) 5 MG tablet Take 5 mg by mouth every evening.      losartan-hydrochlorothiazide (HYZAAR) 100-25 MG tablet TAKE 1 TABLET BY MOUTH EVERY DAY 90 tablet 3   metroNIDAZOLE (FLAGYL) 500 MG tablet Take 1 tablet (500 mg total) by mouth 2 (two) times daily. 14 tablet 0   montelukast (SINGULAIR) 10 MG tablet Take 10 mg by mouth at bedtime.     sulfamethoxazole-trimethoprim (BACTRIM DS) 800-160 MG tablet Take 1 tablet by mouth 2 (two) times daily. One PO BID x 3 days 6 tablet 0   tirzepatide (MOUNJARO) 7.5 MG/0.5ML Pen INJECT 1 PEN INTO THE SKIN ONCE A WEEK 6 mL 1   VITAMIN D PO Take 400 Int'l Units by mouth.     No current facility-administered medications on file prior to visit.    BP 130/80   Pulse 79   Temp 98.4 F (36.9 C) (Oral)   Ht 5\' 6"  (1.676 m)   Wt 188 lb (85.3 kg)   SpO2 95%   BMI 30.34 kg/m       Objective:   Physical Exam Vitals and nursing note reviewed.  Constitutional:      Appearance: Normal appearance.  Eyes:     Conjunctiva/sclera: Conjunctivae normal.     Comments: Bilateral swelling of under eyes   Musculoskeletal:        General: Swelling present.  Skin:    General: Skin is warm and dry.  Neurological:     General: No focal deficit present.     Mental Status: She is alert and oriented to person, place, and time.  Psychiatric:        Mood and Affect: Mood normal.        Thought Content: Thought content normal.        Judgment: Judgment normal.        Assessment & Plan:  1. Periorbital edema - She does not have thyroid disease or CKD  - Likely genetic component but eyes are more edematous than normal today  - predniSONE (DELTASONE) 10 MG  tablet; Take 1 tablet (10 mg total) by mouth daily with breakfast.  Dispense: 5 tablet; Refill: 0  Shirline Frees, NP

## 2023-02-12 ENCOUNTER — Other Ambulatory Visit: Payer: Self-pay | Admitting: Adult Health

## 2023-02-12 DIAGNOSIS — I10 Essential (primary) hypertension: Secondary | ICD-10-CM

## 2023-02-21 ENCOUNTER — Other Ambulatory Visit: Payer: Self-pay | Admitting: Adult Health

## 2023-03-04 ENCOUNTER — Other Ambulatory Visit: Payer: Self-pay | Admitting: Adult Health

## 2023-03-04 DIAGNOSIS — Z1231 Encounter for screening mammogram for malignant neoplasm of breast: Secondary | ICD-10-CM

## 2023-03-09 DIAGNOSIS — Z1231 Encounter for screening mammogram for malignant neoplasm of breast: Secondary | ICD-10-CM

## 2023-03-18 ENCOUNTER — Other Ambulatory Visit: Payer: Self-pay | Admitting: Adult Health

## 2023-03-18 DIAGNOSIS — E785 Hyperlipidemia, unspecified: Secondary | ICD-10-CM

## 2023-03-24 ENCOUNTER — Telehealth: Payer: Self-pay | Admitting: Adult Health

## 2023-03-24 NOTE — Telephone Encounter (Signed)
Patient would like to know the results of her sleep study results. Please give her a call at [phone]. Sleep study was completed in April 2024 but resulted in October.

## 2023-03-25 ENCOUNTER — Ambulatory Visit
Admission: RE | Admit: 2023-03-25 | Discharge: 2023-03-25 | Disposition: A | Discharge status: Home / Self Care | Payer: Medicare HMO | Source: Ambulatory Visit | Attending: Adult Health | Admitting: Adult Health

## 2023-03-25 DIAGNOSIS — Z1231 Encounter for screening mammogram for malignant neoplasm of breast: Secondary | ICD-10-CM

## 2023-03-25 NOTE — Telephone Encounter (Signed)
Pt pt was seen dec 2023 and told to come back in 3 months  She will need appt to go over the results Virtual is fine  Routing back to front for appt to be scheduled

## 2023-03-28 NOTE — Telephone Encounter (Signed)
Patient scheduled first available with Tammy, 05/31/2023

## 2023-03-31 ENCOUNTER — Encounter: Payer: Self-pay | Admitting: Adult Health

## 2023-03-31 NOTE — Telephone Encounter (Signed)
Please advise 

## 2023-04-01 ENCOUNTER — Other Ambulatory Visit: Payer: Self-pay | Admitting: Adult Health

## 2023-04-01 DIAGNOSIS — E785 Hyperlipidemia, unspecified: Secondary | ICD-10-CM

## 2023-04-02 ENCOUNTER — Other Ambulatory Visit (HOSPITAL_COMMUNITY): Payer: Self-pay

## 2023-04-04 ENCOUNTER — Other Ambulatory Visit (HOSPITAL_COMMUNITY): Payer: Self-pay

## 2023-04-04 ENCOUNTER — Other Ambulatory Visit: Payer: Self-pay | Admitting: Adult Health

## 2023-04-05 ENCOUNTER — Other Ambulatory Visit: Payer: Self-pay | Admitting: Adult Health

## 2023-04-05 ENCOUNTER — Other Ambulatory Visit (HOSPITAL_COMMUNITY): Payer: Self-pay

## 2023-04-05 MED ORDER — FREESTYLE LITE TEST VI STRP
ORAL_STRIP | 3 refills | Status: DC
  Filled 2023-04-05: qty 300, 90d supply, fill #0

## 2023-04-05 MED ORDER — ATORVASTATIN CALCIUM 20 MG PO TABS
20.0000 mg | ORAL_TABLET | Freq: Every day | ORAL | 0 refills | Status: DC
Start: 2023-04-05 — End: 2023-04-14
  Filled 2023-04-05: qty 90, 90d supply, fill #0

## 2023-04-05 MED ORDER — FREESTYLE LANCETS MISC
3 refills | Status: DC
  Filled 2023-04-05: qty 300, 90d supply, fill #0

## 2023-04-05 MED ORDER — FREESTYLE LITE TEST VI STRP
ORAL_STRIP | 12 refills | Status: DC
  Filled 2023-04-05: qty 100, 90d supply, fill #0

## 2023-04-05 MED ORDER — FREESTYLE LANCETS MISC
12 refills | Status: DC
  Filled 2023-04-05: qty 100, 90d supply, fill #0

## 2023-04-13 ENCOUNTER — Encounter: Payer: Self-pay | Admitting: Adult Health

## 2023-04-13 ENCOUNTER — Ambulatory Visit: Payer: Medicare HMO | Admitting: Adult Health

## 2023-04-13 VITALS — BP 130/86 | HR 82 | Temp 98.4°F | Ht 65.75 in | Wt 186.0 lb

## 2023-04-13 DIAGNOSIS — E66811 Obesity, class 1: Secondary | ICD-10-CM

## 2023-04-13 DIAGNOSIS — I10 Essential (primary) hypertension: Secondary | ICD-10-CM

## 2023-04-13 DIAGNOSIS — Z7985 Long-term (current) use of injectable non-insulin antidiabetic drugs: Secondary | ICD-10-CM | POA: Diagnosis not present

## 2023-04-13 DIAGNOSIS — Z0001 Encounter for general adult medical examination with abnormal findings: Secondary | ICD-10-CM | POA: Diagnosis not present

## 2023-04-13 DIAGNOSIS — E118 Type 2 diabetes mellitus with unspecified complications: Secondary | ICD-10-CM

## 2023-04-13 DIAGNOSIS — Z0000 Encounter for general adult medical examination without abnormal findings: Secondary | ICD-10-CM

## 2023-04-13 DIAGNOSIS — G4730 Sleep apnea, unspecified: Secondary | ICD-10-CM

## 2023-04-13 DIAGNOSIS — E785 Hyperlipidemia, unspecified: Secondary | ICD-10-CM | POA: Diagnosis not present

## 2023-04-13 LAB — MICROALBUMIN / CREATININE URINE RATIO
Creatinine,U: 116.6 mg/dL
Microalb Creat Ratio: 0.6 mg/g (ref 0.0–30.0)
Microalb, Ur: <0.7 mg/dL (ref 0.0–1.9)

## 2023-04-13 LAB — CBC WITH DIFFERENTIAL/PLATELET
Basophils Absolute: 0 10*3/uL (ref 0.0–0.1)
Basophils Relative: 0.6 % (ref 0.0–3.0)
Eosinophils Absolute: 0.1 10*3/uL (ref 0.0–0.7)
Eosinophils Relative: 1.1 % (ref 0.0–5.0)
HCT: 39.8 % (ref 36.0–46.0)
Hemoglobin: 12.7 g/dL (ref 12.0–15.0)
Lymphocytes Relative: 41.7 % (ref 12.0–46.0)
Lymphs Abs: 2.4 10*3/uL (ref 0.7–4.0)
MCHC: 31.8 g/dL (ref 30.0–36.0)
MCV: 80.4 fL (ref 78.0–100.0)
Monocytes Absolute: 0.3 10*3/uL (ref 0.1–1.0)
Monocytes Relative: 5.9 % (ref 3.0–12.0)
Neutro Abs: 2.9 10*3/uL (ref 1.4–7.7)
Neutrophils Relative %: 50.7 % (ref 43.0–77.0)
Platelets: 195 10*3/uL (ref 150.0–400.0)
RBC: 4.95 Mil/uL (ref 3.87–5.11)
RDW: 15 % (ref 11.5–15.5)
WBC: 5.8 10*3/uL (ref 4.0–10.5)

## 2023-04-13 LAB — LIPID PANEL
Cholesterol: 169 mg/dL (ref 0–200)
HDL: 64.9 mg/dL (ref >39.00)
LDL Cholesterol: 92 mg/dL (ref 0–99)
NonHDL: 104.51
Total CHOL/HDL Ratio: 3
Triglycerides: 61 mg/dL (ref 0.0–149.0)
VLDL: 12.2 mg/dL (ref 0.0–40.0)

## 2023-04-13 LAB — COMPREHENSIVE METABOLIC PANEL
ALT: 22 U/L (ref 0–35)
AST: 21 U/L (ref 0–37)
Albumin: 4.5 g/dL (ref 3.5–5.2)
Alkaline Phosphatase: 64 U/L (ref 39–117)
BUN: 18 mg/dL (ref 6–23)
CO2: 31 meq/L (ref 19–32)
Calcium: 9.4 mg/dL (ref 8.4–10.5)
Chloride: 100 meq/L (ref 96–112)
Creatinine, Ser: 1.05 mg/dL (ref 0.40–1.20)
GFR: 57.06 mL/min — ABNORMAL LOW (ref >60.00)
Glucose, Bld: 86 mg/dL (ref 70–99)
Potassium: 3.6 meq/L (ref 3.5–5.1)
Sodium: 139 meq/L (ref 135–145)
Total Bilirubin: 0.7 mg/dL (ref 0.2–1.2)
Total Protein: 7 g/dL (ref 6.0–8.3)

## 2023-04-13 LAB — HEMOGLOBIN A1C: Hgb A1c MFr Bld: 5.8 % (ref 4.6–6.5)

## 2023-04-13 LAB — TSH: TSH: 0.46 u[IU]/mL (ref 0.35–5.50)

## 2023-04-13 MED ORDER — TIRZEPATIDE 10 MG/0.5ML ~~LOC~~ SOAJ: 10.0000 mg | SUBCUTANEOUS | 0 refills | Status: AC

## 2023-04-13 NOTE — Progress Notes (Signed)
Subjective:    Patient ID: Kristin Merritt, female    DOB: April 02, 1961, 62 y.o.   MRN: [id number]  HPI  Patient presents for yearly preventative medicine examination. She is a pleasant 62 year old female who  has a past medical history of Allergy, Anemia, Anxiety, Asthma, Constipation, Depression, GERD (gastroesophageal reflux disease), Heart murmur, High cholesterol, Hypertension, Leg cramps, Lichen sclerosus et atrophicus, Numbness and tingling in hands, Postmenopausal HRT (hormone replacement therapy) - followed by Dr. Lily Peer in gyn (07/11/2012), Swelling of both hands, Uterine cancer (HCC) (1991), UTI (urinary tract infection), VIN III (vulvar intraepithelial neoplasia III), and VIN III (vulvar intraepithelial neoplasia III).  DM Type 2 - In November 2023 she was started on [facility].  Currently on 7.5 mg weekly.  She has been tolerating this medication well with minimal nausea. She is exercising three times a week and eating healthier. She is abe to wear clothes that she has not been able to wear in many years. She has lost 25 pounds since she started [facility].  Lab Results  Component Value Date   HGBA1C 5.3 10/06/2022   HGBA1C 5.6 07/15/2022   HGBA1C 6.4 04/02/2022   Wt Readings from Last 15 Encounters:  04/13/23 186 lb (84.4 kg)  01/05/23 188 lb (85.3 kg)  01/03/23 188 lb (85.3 kg)  10/06/22 186 lb (84.4 kg)  09/07/22 181 lb (82.1 kg)  07/15/22 186 lb (84.4 kg)  07/07/22 192 lb 3.2 oz (87.2 kg)  06/16/22 195 lb (88.5 kg)  05/31/22 207 lb (93.9 kg)  05/12/22 207 lb (93.9 kg)  04/26/22 209 lb (94.8 kg)  04/09/22 211 lb (95.7 kg)  04/02/22 211 lb (95.7 kg)  02/24/22 209 lb 3.2 oz (94.9 kg)  02/12/22 208 lb (94.3 kg)   Hypertension Managed with Hyzaar 100-25 mg daily. She has been checking her BP at home with readings in the 120-130's/70-80. She denies dizziness, lightheadedness, blurred vision.  BP Readings from Last 3 Encounters:  04/13/23 130/86  01/05/23 130/80  01/03/23  120/80   Hyperlipidemia-currently managed with Lipitor 20 mg daily.  She denies myalgia or fatigue  OSA - she had sleep study done earlier this year which showed total of 43 apneas and 106 hyponeas.  He had a total of 146 obstructive events.  This recording.  There was a total of 95 desaturations.  The baseline oxygen level is 98% and the lowest oxygen level was 80%.  She does have a follow-up appointment scheduled for next week. She reports chronic fatigue and headaches in the morning.   All immunizations and health maintenance protocols were reviewed with the patient and needed orders were placed.  Appropriate screening laboratory values were ordered for the patient including screening of hyperlipidemia, renal function and hepatic function.  Medication reconciliation,  past medical history, social history, problem list and allergies were reviewed in detail with the patient  Goals were established with regard to weight loss, exercise, and  diet in compliance with medications. She is eating healthy, she is exercising on a routine basis at home.   He is up-to-date on routine colon cancer screening, mammogram, and GYN exams  Review of Systems  Constitutional:  Positive for fatigue.  HENT: Negative.    Eyes: Negative.   Respiratory: Negative.    Cardiovascular: Negative.   Gastrointestinal: Negative.   Endocrine: Negative.   Genitourinary: Negative.   Musculoskeletal: Negative.   Skin: Negative.   Allergic/Immunologic: Negative.   Neurological:  Positive for headaches.  Hematological: Negative.   Psychiatric/Behavioral:  Negative.     Past Medical History:  Diagnosis Date   Allergy    Anemia    as child   Anxiety    Asthma    Constipation    Depression    GERD (gastroesophageal reflux disease)    Heart murmur    High cholesterol    Hypertension    Leg cramps    Lichen sclerosus et atrophicus    Numbness and tingling in hands    Postmenopausal HRT (hormone replacement  therapy) - followed by Dr. 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   VIN III (vulvar intraepithelial neoplasia III)     Social History   Socioeconomic History   Marital status: Widowed    Spouse name: Not on file   Number of children: Not on file   Years of education: Not on file   Highest education level: 12th grade  Occupational History   Not on file  Tobacco Use   Smoking status: Former    Current packs/day: 0.00    Average packs/day: 0.3 packs/day for 20.0 years (5.0 ttl pk-yrs)    Types: Cigarettes    Start date: 02/21/1997    Quit date: 02/21/2017    Years since quitting: 6.1   Smokeless tobacco: Never  Vaping Use   Vaping status: Never Used  Substance and Sexual Activity   Alcohol use: Not Currently   Drug use: No   Sexual activity: Not Currently    Partners: Male    Birth control/protection: Surgical    Comment: 1st intercourse- 17, partners- 7, hysterectomy  Other Topics Concern   Not on file  Social History Narrative   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 Enjoys walking   Diet: Tries to watch what she eats. Does not eat a lot of processed foods or fast food.    Social Determinants of Health   Financial Resource Strain: Low Risk  (02/19/2022)   Overall Financial Resource Strain (CARDIA)    Difficulty of Paying Living Expenses: Not hard at all  Food Insecurity: No Food Insecurity (02/19/2022)   Hunger Vital Sign    Worried About Running Out of Food in the Last Year: Never true    Ran Out of Food in the Last Year: Never true  Transportation Needs: No Transportation Needs (02/19/2022)   PRAPARE - Administrator, Civil Service (Medical): No    Lack of Transportation (Non-Medical): No   Physical Activity: Sufficiently Active (02/19/2022)   Exercise Vital Sign    Days of Exercise per Week: 5 days    Minutes of Exercise per Session: 30 min  Stress: No Stress Concern Present (02/19/2022)   [employer] of Occupational Health - Occupational Stress Questionnaire    Feeling of Stress : Not at all  Social Connections: Unknown (02/19/2022)   Social Connection and Isolation Panel [NHANES]    Frequency of Communication with Friends and Family: More than three times a week    Frequency of Social Gatherings with Friends and Family: Once a week    Attends Religious Services: Patient declined    Database administrator or Organizations:  Yes    Attends Banker Meetings: Patient declined    Marital Status: Widowed  Intimate Partner Violence: Not At Risk (05/01/2020)   Humiliation, Afraid, Rape, and Kick questionnaire    Fear of Current or Ex-Partner: No    Emotionally Abused: No    Physically Abused: No    Sexually Abused: No    Past Surgical History:  Procedure Laterality Date   BREAST EXCISIONAL BIOPSY Left 08/24/2016   times 2   EYE SURGERY     "Lazy Muscle" x 2- one as child and another 1998   SPINE SURGERY     L5-s1   VAGINAL HYSTERECTOMY     VULVA /PERINEUM BIOPSY N/A 11/18/2015   Procedure: WIDE LOCAL EXCISION OF VULVA and MEDIAL LEFT THIGH LESION ;  Surgeon: Ok Edwards, MD;  Location: [facility];  Service: Gynecology;  Laterality: N/A;   WISDOM TOOTH EXTRACTION      Family History  Problem Relation Age of Onset   Cancer Mother        LUNG    Breast cancer Mother        Died   Esophageal cancer Mother    Colon cancer Neg Hx    Colon polyps Neg Hx    Rectal cancer Neg Hx    Stomach cancer Neg Hx     Allergies  Allergen Reactions   Percocet [Oxycodone-Acetaminophen] Itching    Current Outpatient Medications on File Prior to Visit  Medication Sig Dispense Refill   Albuterol Sulfate (PROAIR HFA IN) Inhale into the lungs. 1-2 puffs as  needed every 4-6 hours for cough and wheezing     atorvastatin (LIPITOR) 20 MG tablet Take 1 tablet (20 mg total) by mouth daily. 90 tablet 0   docusate sodium (COLACE) 100 MG capsule      EPIPEN 2-PAK 0.3 MG/0.3ML SOAJ injection      FIBER ADULT GUMMIES PO Take by mouth.     glucose blood (FREESTYLE LITE) test strip Use to check blood sugar 3 times daily 300 each 3   KLOR-CON M20 20 MEQ tablet TAKE 1 TABLET BY MOUTH EVERY DAY 90 tablet 0   Lancets (FREESTYLE) lancets Use to check blood sugar 3 times daily 300 each 3   lansoprazole (PREVACID) 30 MG capsule TAKE 1 CAPSULE BY MOUTH ONCE DAILY 90 capsule 3   levocetirizine (XYZAL) 5 MG tablet Take 5 mg by mouth every evening.      losartan-hydrochlorothiazide (HYZAAR) 100-25 MG tablet TAKE 1 TABLET BY MOUTH EVERY DAY 90 tablet 3   montelukast (SINGULAIR) 10 MG tablet Take 10 mg by mouth at bedtime.     tirzepatide (MOUNJARO) 7.5 MG/0.5ML Pen INJECT 1 PEN INTO THE SKIN ONCE A WEEK 6 mL 1   VITAMIN D PO Take 400 Int'l Units by mouth.     No current facility-administered medications on file prior to visit.    BP 130/86   Pulse 82   Temp 98.4 F (36.9 C) (Oral)   Ht 5' 5.75" (1.67 m)   Wt 186 lb (84.4 kg)   BMI 30.25 kg/m       Objective:   Physical Exam Vitals and nursing note reviewed.  Constitutional:      General: She is not in acute distress.    Appearance: Normal appearance. She is obese. She is not ill-appearing.  HENT:     Head: Normocephalic and atraumatic.     Right Ear: Tympanic membrane, ear canal and external ear normal. There  is no impacted cerumen.     Left Ear: Tympanic membrane, ear canal and external ear normal. There is no impacted cerumen.     Nose: Nose normal. No congestion or rhinorrhea.     Mouth/Throat:     Mouth: Mucous membranes are moist.     Pharynx: Oropharynx is clear.  Eyes:     Extraocular Movements: Extraocular movements intact.     Conjunctiva/sclera: Conjunctivae normal.     Pupils: Pupils  are equal, round, and reactive to light.  Neck:     Vascular: No carotid bruit.  Cardiovascular:     Rate and Rhythm: Normal rate and regular rhythm.     Pulses: Normal pulses.     Heart sounds: No murmur heard.    No friction rub. No gallop.  Pulmonary:     Effort: Pulmonary effort is normal.     Breath sounds: Normal breath sounds.  Abdominal:     General: Abdomen is flat. Bowel sounds are normal. There is no distension.     Palpations: Abdomen is soft. There is no mass.     Tenderness: There is no abdominal tenderness. There is no guarding or rebound.     Hernia: No hernia is present.  Musculoskeletal:        General: Normal range of motion.     Cervical back: Normal range of motion and neck supple.  Lymphadenopathy:     Cervical: No cervical adenopathy.  Skin:    General: Skin is warm and dry.     Capillary Refill: Capillary refill takes less than 2 seconds.  Neurological:     General: No focal deficit present.     Mental Status: She is alert and oriented to person, place, and time.  Psychiatric:        Mood and Affect: Mood normal.        Behavior: Behavior normal.        Thought Content: Thought content normal.        Judgment: Judgment normal.        Assessment & Plan:  1. Routine general medical examination at a health care facility Today patient counseled on age appropriate routine health concerns for screening and prevention, each reviewed and up to date or declined. Immunizations reviewed and up to date or declined. Labs ordered and reviewed. Risk factors for depression reviewed and negative. Hearing function and visual acuity are intact. ADLs screened and addressed as needed. Functional ability and level of safety reviewed and appropriate. Education, counseling and referrals performed based on assessed risks today. Patient provided with a copy of personalized plan for preventive services. - Advised shingles and Tdap vaccinations at local pharmacy  - Encouraged  weight bearing exercises to prevent muscle loss and help prevent osteoporosis  - Follow up in one year or sooner if needed  2. Controlled type 2 diabetes mellitus with complication, without long-term current use of insulin (HCC)  - CBC with Differential/Platelet; Future - Comprehensive metabolic panel; Future - Lipid panel; Future - TSH; Future - Hemoglobin A1c; Future - Microalbumin/Creatinine Ratio, Urine; Future  3. Long-term current use of injectable noninsulin antidiabetic medication - will increase Mounjaro to 10 mg weekly  - Follow up in 3 months  - CBC with Differential/Platelet; Future - Comprehensive metabolic panel; Future - Lipid panel; Future - TSH; Future - Hemoglobin A1c; Future - Microalbumin/Creatinine Ratio, Urine; Future - tirzepatide (MOUNJARO) 10 MG/0.5ML Pen; Inject 10 mg into the skin once a week.  Dispense: 6 mL; Refill: 0  4. Essential hypertension, benign - Controlled. No change in medication - CBC with Differential/Platelet; Future - Comprehensive metabolic panel; Future - Lipid panel; Future - TSH; Future  5. Hyperlipidemia, unspecified hyperlipidemia type - Continue with statin  - CBC with Differential/Platelet; Future - Comprehensive metabolic panel; Future - Lipid panel; Future - TSH; Future  6. Sleep apnea, unspecified type - Follow up with pulmonary next week.  - CBC with Differential/Platelet; Future - Comprehensive metabolic panel; Future - Lipid panel; Future - TSH; Future  7. Class 1 obesity - Encouraged weight bearing exercise and healthy diet  - CBC with Differential/Platelet; Future - Comprehensive metabolic panel; Future - Lipid panel; Future - TSH; Future  Shirline Frees, NP

## 2023-04-13 NOTE — Patient Instructions (Signed)
It was great seeing you today   We will follow up with you regarding your lab work   Please let me know if you need anything   

## 2023-04-14 ENCOUNTER — Other Ambulatory Visit: Payer: Self-pay

## 2023-04-14 MED ORDER — ATORVASTATIN CALCIUM 40 MG PO TABS: 40.0000 mg | ORAL_TABLET | Freq: Every day | ORAL | 0 refills | Status: DC

## 2023-04-15 ENCOUNTER — Other Ambulatory Visit (HOSPITAL_COMMUNITY): Payer: Self-pay

## 2023-04-18 ENCOUNTER — Encounter: Payer: Self-pay | Admitting: Adult Health

## 2023-04-18 ENCOUNTER — Ambulatory Visit: Payer: Medicare HMO | Admitting: Adult Health

## 2023-04-18 VITALS — BP 134/83 | HR 71 | Temp 98.4°F

## 2023-04-18 DIAGNOSIS — G4733 Obstructive sleep apnea (adult) (pediatric): Secondary | ICD-10-CM | POA: Diagnosis not present

## 2023-04-18 NOTE — Progress Notes (Signed)
@Patient  ID: Alcus Dad, female    DOB: 25-Feb-1961, 62 y.o.   MRN: [id number]  Chief Complaint  Patient presents with   Follow-up    Referring provider: Shirline Frees, NP  HPI: 62 year old female seen for sleep consult April 26, 2022 for loud snoring and daytime sleepiness found to have moderate obstructive sleep apnea  TEST/EVENTS :   04/18/2023 Follow up : OSA Patient presents for a follow-up visit.  Patient was seen April 26, 2022 for loud snoring and daytime sleepiness.  She was set up for a home sleep study that was completed on August 24, 2022.  This showed moderate sleep apnea with a AHI at 18.4/hour and SpO2 low at 80%.  We discussed her sleep study results in detail.  Went over treatment options.  Patient would like to proceed with CPAP therapy.  She has removable dental work and would not be a candidate for dental appliance. She has daytime sleepiness and feels tired.    Allergies  Allergen Reactions   Percocet [Oxycodone-Acetaminophen] Itching    Immunization History  Administered Date(s) Administered   Influenza, High Dose Seasonal PF 05/04/2013, 07/27/2017   Influenza,inj,Quad PF,6+ Mos 01/29/2013, 02/13/2014, 02/21/2015, 03/19/2016, 03/09/2017, 01/23/2021, 04/02/2022, 03/18/2023   Influenza-Unspecified 02/21/2018, 03/07/2020   Moderna Covid-19 Vaccine Bivalent Booster 72yrs & up 05/08/2021   Moderna Sars-Covid-2 Vaccination 08/20/2019, 09/12/2019, 03/03/2020   PNEUMOCOCCAL CONJUGATE-20 01/15/2021   PPD Test 06/03/2014, 08/19/2015, 10/06/2016   Respiratory Syncytial Virus Vaccine,Recomb Aduvanted(Arexvy) 05/10/2022   Tdap 01/09/2013    Past Medical History:  Diagnosis Date   Allergy    Anemia    as child   Anxiety    Asthma    Constipation    Depression    GERD (gastroesophageal reflux disease)    Heart murmur    High cholesterol    Hypertension    Leg cramps    Lichen sclerosus et atrophicus    Numbness and tingling in hands     Postmenopausal HRT (hormone replacement therapy) - followed by Dr. 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   VIN III (vulvar intraepithelial neoplasia III)     Tobacco History: Social History   Tobacco Use  Smoking Status Former   Current packs/day: 0.00   Average packs/day: 0.3 packs/day for 20.0 years (5.0 ttl pk-yrs)   Types: Cigarettes   Start date: 02/21/1997   Quit date: 02/21/2017   Years since quitting: 6.1  Smokeless Tobacco Never   Counseling given: Not Answered   Outpatient Medications Prior to Visit  Medication Sig Dispense Refill   Albuterol Sulfate (PROAIR HFA IN) Inhale into the lungs. 1-2 puffs as needed every 4-6 hours for cough and wheezing     atorvastatin (LIPITOR) 40 MG tablet Take 1 tablet (40 mg total) by mouth daily. 90 tablet 0   docusate sodium (COLACE) 100 MG capsule      EPIPEN 2-PAK 0.3 MG/0.3ML SOAJ injection      FIBER ADULT GUMMIES PO Take by mouth.     glucose blood (FREESTYLE LITE) test strip Use to check blood sugar 3 times daily 300 each 3   KLOR-CON M20 20 MEQ tablet TAKE 1 TABLET BY MOUTH EVERY DAY 90 tablet 0   Lancets (FREESTYLE) lancets Use to check blood sugar 3 times daily 300 each 3   lansoprazole (PREVACID)  30 MG capsule TAKE 1 CAPSULE BY MOUTH ONCE DAILY 90 capsule 3   levocetirizine (XYZAL) 5 MG tablet Take 5 mg by mouth every evening.      losartan-hydrochlorothiazide (HYZAAR) 100-25 MG tablet TAKE 1 TABLET BY MOUTH EVERY DAY 90 tablet 3   montelukast (SINGULAIR) 10 MG tablet Take 10 mg by mouth at bedtime.     tirzepatide (MOUNJARO) 10 MG/0.5ML Pen Inject 10 mg into the skin once a week. 6 mL 0   VITAMIN D PO Take 400 Int'l Units by mouth.     No facility-administered medications prior to visit.     Review of Systems:   Constitutional:   No  weight loss, night sweats,  Fevers,  chills, fatigue, or  lassitude.  HEENT:   No headaches,  Difficulty swallowing,  Tooth/dental problems, or  Sore throat,                No sneezing, itching, ear ache, nasal congestion, post nasal drip,   CV:  No chest pain,  Orthopnea, PND, swelling in lower extremities, anasarca, dizziness, palpitations, syncope.   GI  No heartburn, indigestion, abdominal pain, nausea, vomiting, diarrhea, change in bowel habits, loss of appetite, bloody stools.   Resp: No shortness of breath with exertion or at rest.  No excess mucus, no productive cough,  No non-productive cough,  No coughing up of blood.  No change in color of mucus.  No wheezing.  No chest wall deformity  Skin: no rash or lesions.  GU: no dysuria, change in color of urine, no urgency or frequency.  No flank pain, no hematuria   MS:  No joint pain or swelling.  No decreased range of motion.  No back pain.    Physical Exam  BP 134/83 (BP Location: Right Arm, Patient Position: Sitting, Cuff Size: Normal)   Pulse 71   Temp 98.4 F (36.9 C) (Oral)   SpO2 97%   GEN: A/Ox3; pleasant , NAD, well nourished    HEENT:  [place]/AT,  NOSE-clear, THROAT-clear, no lesions, no postnasal drip or exudate noted.   NECK:  Supple w/ fair ROM; no JVD; normal carotid impulses w/o bruits; no thyromegaly or nodules palpated; no lymphadenopathy.    RESP  Clear  P & A; w/o, wheezes/ rales/ or rhonchi. no accessory muscle use, no dullness to percussion  CARD:  RRR, no m/r/g, no peripheral edema, pulses intact, no cyanosis or clubbing.  GI:   Soft & nt; nml bowel sounds; no organomegaly or masses detected.   Musco: Warm bil, no deformities or joint swelling noted.   Neuro: alert, no focal deficits noted.    Skin: Warm, no lesions or rashes    Lab Results:  CBC  BNP No results found for: "BNP"  ProBNP No results found for: "PROBNP"   Administration History     None           No data to display          No results found for:  "NITRICOXIDE"      Assessment & Plan:   OSA (obstructive sleep apnea) Moderate obstructive sleep apnea.  Patient education given on sleep apnea to patient.  We discussed sleep study results in detail.  Will begin auto CPAP 5 to 15 cm H2O.  - discussed how weight can impact sleep and risk for sleep disordered breathing - discussed options to assist with weight loss: combination of diet modification, cardiovascular and strength training exercises   - had an  extensive discussion regarding the adverse health consequences related to untreated sleep disordered breathing - specifically discussed the risks for hypertension, coronary artery disease, cardiac dysrhythmias, cerebrovascular disease, and diabetes - lifestyle modification discussed   - discussed how sleep disruption can increase risk of accidents, particularly when driving - safe driving practices were discussed   Plan  Patient Instructions  Begin CPAP At bedtime, wear all night long.  Work on healthy sleep regimen  Healthy weight loss  Do not drive if sleepy  Follow up in 3 months and As needed                 Rubye Oaks, NP 04/18/2023

## 2023-04-18 NOTE — Assessment & Plan Note (Signed)
Moderate obstructive sleep apnea.  Patient education given on sleep apnea to patient.  We discussed sleep study results in detail.  Will begin auto CPAP 5 to 15 cm H2O.  - discussed how weight can impact sleep and risk for sleep disordered breathing - discussed options to assist with weight loss: combination of diet modification, cardiovascular and strength training exercises   - had an extensive discussion regarding the adverse health consequences related to untreated sleep disordered breathing - specifically discussed the risks for hypertension, coronary artery disease, cardiac dysrhythmias, cerebrovascular disease, and diabetes - lifestyle modification discussed   - discussed how sleep disruption can increase risk of accidents, particularly when driving - safe driving practices were discussed   Plan  Patient Instructions  Begin CPAP At bedtime, wear all night long.  Work on healthy sleep regimen  Healthy weight loss  Do not drive if sleepy  Follow up in 3 months and As needed

## 2023-04-18 NOTE — Patient Instructions (Signed)
Begin CPAP At bedtime, wear all night long.  Work on healthy sleep regimen  Healthy weight loss  Do not drive if sleepy  Follow up in 3 months and As needed

## 2023-05-06 ENCOUNTER — Ambulatory Visit: Payer: Medicare HMO | Admitting: Adult Health

## 2023-05-06 ENCOUNTER — Encounter: Payer: Self-pay | Admitting: Adult Health

## 2023-05-06 VITALS — BP 120/80 | HR 78 | Temp 98.2°F | Wt 184.0 lb

## 2023-05-06 DIAGNOSIS — R1013 Epigastric pain: Secondary | ICD-10-CM | POA: Diagnosis not present

## 2023-05-06 MED ORDER — SUCRALFATE 1 GM/10ML PO SUSP: 1.0000 g | Freq: Three times a day (TID) | ORAL | 0 refills | Status: DC

## 2023-05-06 NOTE — Progress Notes (Signed)
Subjective:    Patient ID: Kristin Merritt, female    DOB: 12/20/1960, 62 y.o.   MRN: [id number]  HPI 62 year old female who  has a past medical history of Allergy, Anemia, Anxiety, Asthma, Constipation, Depression, GERD (gastroesophageal reflux disease), Heart murmur, High cholesterol, Hypertension, Leg cramps, Lichen sclerosus et atrophicus, Numbness and tingling in hands, Postmenopausal HRT (hormone replacement therapy) - followed by Dr. Lily Peer in gyn (07/11/2012), Swelling of both hands, Uterine cancer (HCC) (1991), UTI (urinary tract infection), VIN III (vulvar intraepithelial neoplasia III), and VIN III (vulvar intraepithelial neoplasia III).  She presents to the office today for an acute issue. She reports that 5 day days ago she ate hibachi chicken. The next day she developed abdominal discomfort that felt like nausea with associated diarrhea and vomiting. Her diarrhea and vomiting resolved within a day. Nausea is improving but continues to be present. She has been taking Pravacid daily. She denies fevers or chills. She feels about " 96% better" today    Review of Systems See HPI   Past Medical History:  Diagnosis Date   Allergy    Anemia    as child   Anxiety    Asthma    Constipation    Depression    GERD (gastroesophageal reflux disease)    Heart murmur    High cholesterol    Hypertension    Leg cramps    Lichen sclerosus et atrophicus    Numbness and tingling in hands    Postmenopausal HRT (hormone replacement therapy) - followed by Dr. 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   VIN III (vulvar intraepithelial neoplasia III)     Social History   Socioeconomic History   Marital status: Widowed    Spouse name: Not on file   Number of children: Not on file   Years of education: Not on file   Highest education level: 12th  grade  Occupational History   Not on file  Tobacco Use   Smoking status: Former    Current packs/day: 0.00    Average packs/day: 0.3 packs/day for 20.0 years (5.0 ttl pk-yrs)    Types: Cigarettes    Start date: 02/21/1997    Quit date: 02/21/2017    Years since quitting: 6.2   Smokeless tobacco: Never  Vaping Use   Vaping status: Never Used  Substance and Sexual Activity   Alcohol use: Not Currently   Drug use: No   Sexual activity: Not Currently    Partners: Male    Birth control/protection: Surgical    Comment: 1st intercourse- 17, partners- 7, hysterectomy  Other Topics Concern   Not on file  Social History Narrative   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 Enjoys walking   Diet: Tries to watch what she eats. Does not eat a lot of processed foods or fast food.    Social Drivers of Corporate investment banker Strain: Low Risk  (02/19/2022)   Overall Financial Resource Strain (CARDIA)    Difficulty of Paying Living Expenses: Not hard at all  Food Insecurity: No Food Insecurity (02/19/2022)   Hunger Vital Sign  Worried About Programme researcher, broadcasting/film/video in the Last Year: Never true    Ran Out of Food in the Last Year: Never true  Transportation Needs: No Transportation Needs (02/19/2022)   PRAPARE - Administrator, Civil Service (Medical): No    Lack of Transportation (Non-Medical): No  Physical Activity: Sufficiently Active (02/19/2022)   Exercise Vital Sign    Days of Exercise per Week: 5 days    Minutes of Exercise per Session: 30 min  Stress: No Stress Concern Present (02/19/2022)   [employer] of Occupational Health - Occupational Stress Questionnaire    Feeling of Stress : Not at all  Social Connections: Unknown (02/19/2022)   Social Connection and Isolation Panel [NHANES]    Frequency of Communication with Friends and Family: More than three  times a week    Frequency of Social Gatherings with Friends and Family: Once a week    Attends Religious Services: Patient declined    Database administrator or Organizations: Yes    Attends Banker Meetings: Patient declined    Marital Status: Widowed  Intimate Partner Violence: Not At Risk (05/01/2020)   Humiliation, Afraid, Rape, and Kick questionnaire    Fear of Current or Ex-Partner: No    Emotionally Abused: No    Physically Abused: No    Sexually Abused: No    Past Surgical History:  Procedure Laterality Date   BREAST EXCISIONAL BIOPSY Left 08/24/2016   times 2   EYE SURGERY     "Lazy Muscle" x 2- one as child and another 1998   SPINE SURGERY     L5-s1   VAGINAL HYSTERECTOMY     VULVA /PERINEUM BIOPSY N/A 11/18/2015   Procedure: WIDE LOCAL EXCISION OF VULVA and MEDIAL LEFT THIGH LESION ;  Surgeon: Ok Edwards, MD;  Location: [facility];  Service: Gynecology;  Laterality: N/A;   WISDOM TOOTH EXTRACTION      Family History  Problem Relation Age of Onset   Cancer Mother        LUNG    Breast cancer Mother        Died   Esophageal cancer Mother    Colon cancer Neg Hx    Colon polyps Neg Hx    Rectal cancer Neg Hx    Stomach cancer Neg Hx     Allergies  Allergen Reactions   Percocet [Oxycodone-Acetaminophen] Itching    Current Outpatient Medications on File Prior to Visit  Medication Sig Dispense Refill   Albuterol Sulfate (PROAIR HFA IN) Inhale into the lungs. 1-2 puffs as needed every 4-6 hours for cough and wheezing     atorvastatin (LIPITOR) 40 MG tablet Take 1 tablet (40 mg total) by mouth daily. 90 tablet 0   docusate sodium (COLACE) 100 MG capsule      EPIPEN 2-PAK 0.3 MG/0.3ML SOAJ injection      FIBER ADULT GUMMIES PO Take by mouth.     glucose blood (FREESTYLE LITE) test strip Use to check blood sugar 3 times daily 300 each 3   KLOR-CON M20 20 MEQ tablet TAKE 1 TABLET BY MOUTH EVERY DAY 90 tablet 0   Lancets (FREESTYLE) lancets Use to  check blood sugar 3 times daily 300 each 3   lansoprazole (PREVACID) 30 MG capsule TAKE 1 CAPSULE BY MOUTH ONCE DAILY 90 capsule 3   levocetirizine (XYZAL) 5 MG tablet Take 5 mg by mouth every evening.      losartan-hydrochlorothiazide (HYZAAR) 100-25 MG tablet  TAKE 1 TABLET BY MOUTH EVERY DAY 90 tablet 3   montelukast (SINGULAIR) 10 MG tablet Take 10 mg by mouth at bedtime.     tirzepatide (MOUNJARO) 10 MG/0.5ML Pen Inject 10 mg into the skin once a week. 6 mL 0   VITAMIN D PO Take 400 Int'l Units by mouth.     No current facility-administered medications on file prior to visit.    BP 120/80   Pulse 78   Temp 98.2 F (36.8 C)   Wt 184 lb (83.5 kg)   SpO2 94%   BMI 29.92 kg/m       Objective:   Physical Exam Vitals and nursing note reviewed.  Constitutional:      Appearance: Normal appearance.  Cardiovascular:     Rate and Rhythm: Normal rate and regular rhythm.     Pulses: Normal pulses.     Heart sounds: Normal heart sounds.  Pulmonary:     Effort: Pulmonary effort is normal.     Breath sounds: Normal breath sounds.  Abdominal:     General: Abdomen is flat. Bowel sounds are normal.     Palpations: Abdomen is soft.     Tenderness: There is abdominal tenderness in the epigastric area.  Skin:    General: Skin is warm and dry.  Neurological:     General: No focal deficit present.     Mental Status: She is alert and oriented to person, place, and time.  Psychiatric:        Mood and Affect: Mood normal.        Behavior: Behavior normal.        Thought Content: Thought content normal.        Judgment: Judgment normal.       Assessment & Plan:  1. Epigastric pain (Primary) - Food borne illness vs worsening GERD. I am glad she is feeling better. Will send in Carafate to take for the next day or so - Follow up if not resolved in the next 2-3 days  - sucralfate (CARAFATE) 1 GM/10ML suspension; Take 10 mLs (1 g total) by mouth 4 (four) times daily -  with meals and at  bedtime.  Dispense: 420 mL; Refill: 0  Shirline Frees, NP

## 2023-05-29 ENCOUNTER — Other Ambulatory Visit: Payer: Self-pay | Admitting: Adult Health

## 2023-05-29 DIAGNOSIS — R1013 Epigastric pain: Secondary | ICD-10-CM

## 2023-05-31 ENCOUNTER — Ambulatory Visit: Payer: Medicare HMO | Admitting: Adult Health

## 2023-06-24 ENCOUNTER — Other Ambulatory Visit: Payer: Self-pay | Admitting: Adult Health

## 2023-06-24 DIAGNOSIS — R1013 Epigastric pain: Secondary | ICD-10-CM

## 2023-06-24 NOTE — Telephone Encounter (Signed)
 Okay for refill?

## 2023-07-07 ENCOUNTER — Other Ambulatory Visit: Payer: Self-pay | Admitting: Adult Health

## 2023-07-07 DIAGNOSIS — Z7985 Long-term (current) use of injectable non-insulin antidiabetic drugs: Secondary | ICD-10-CM

## 2023-07-12 ENCOUNTER — Other Ambulatory Visit: Payer: Self-pay | Admitting: Adult Health

## 2023-07-14 ENCOUNTER — Ambulatory Visit: Payer: Medicare HMO | Admitting: Adult Health

## 2023-07-18 DIAGNOSIS — J4520 Mild intermittent asthma, uncomplicated: Secondary | ICD-10-CM | POA: Insufficient documentation

## 2023-07-18 DIAGNOSIS — R7303 Prediabetes: Secondary | ICD-10-CM | POA: Insufficient documentation

## 2023-07-18 DIAGNOSIS — K219 Gastro-esophageal reflux disease without esophagitis: Secondary | ICD-10-CM | POA: Insufficient documentation

## 2023-07-18 DIAGNOSIS — I10 Essential (primary) hypertension: Secondary | ICD-10-CM | POA: Insufficient documentation

## 2023-07-18 DIAGNOSIS — G4700 Insomnia, unspecified: Secondary | ICD-10-CM | POA: Insufficient documentation

## 2023-07-18 DIAGNOSIS — G5600 Carpal tunnel syndrome, unspecified upper limb: Secondary | ICD-10-CM | POA: Insufficient documentation

## 2023-07-19 ENCOUNTER — Ambulatory Visit: Payer: Medicare HMO | Admitting: Adult Health

## 2023-07-19 ENCOUNTER — Encounter: Payer: Self-pay | Admitting: Adult Health

## 2023-07-19 VITALS — BP 128/68 | HR 77 | Ht 65.75 in | Wt 189.0 lb

## 2023-07-19 DIAGNOSIS — G4733 Obstructive sleep apnea (adult) (pediatric): Secondary | ICD-10-CM | POA: Diagnosis not present

## 2023-07-19 NOTE — Progress Notes (Signed)
 @Patient  ID: Kristin Merritt, female    DOB: 1961-01-28, 63 y.o.   MRN: [id number]  Chief Complaint  Patient presents with   Follow-up    Referring provider: Shirline Frees, NP  HPI: 63 yo female seen for sleep consult 04/26/22 found to have moderate OSA  TEST/EVENTS :  home sleep study that was completed on August 24, 2022. This showed moderate sleep apnea with a AHI at 18.4/hour and SpO2 low at 80%.   07/19/2023 Follow up:OSA Patient presents for a 76-month follow-up.  Patient has moderate obstructive sleep apnea.  She was started on CPAP last visit.  She says she is doing well.  Is getting used to CPAP.  Feels she is benefiting from CPAP with less daytime sleepiness. She is trying to wear it each night.  Feels that she is benefiting from CPAP with decreased daytime sleepiness.  CPAP download shows 87% compliance.  Daily average usage at 6.5 hours.  Patient is on auto 5 to 15 cm H2O.  AHI 1.2/hour.  Daily average pressure at 10.6 cm H2O. Using nasal pillows or full nasal mask.   Works in home care.     No Active Allergies  Immunization History  Administered Date(s) Administered   Influenza, High Dose Seasonal PF 05/04/2013, 07/27/2017   Influenza,inj,Quad PF,6+ Mos 01/29/2013, 02/13/2014, 02/21/2015, 03/19/2016, 03/09/2017, 01/23/2021, 04/02/2022, 03/18/2023   Influenza-Unspecified 02/21/2018, 03/07/2020   Moderna Covid-19 Vaccine Bivalent Booster 64yrs & up 05/08/2021   Moderna Sars-Covid-2 Vaccination 08/20/2019, 09/12/2019, 03/03/2020   PNEUMOCOCCAL CONJUGATE-20 01/15/2021   PPD Test 06/03/2014, 08/19/2015, 10/06/2016   Respiratory Syncytial Virus Vaccine,Recomb Aduvanted(Arexvy) 05/10/2022   Tdap 01/09/2013    Past Medical History:  Diagnosis Date   Allergy    Anemia    as child   Anxiety    Asthma    Constipation    Depression    GERD (gastroesophageal reflux disease)    Heart murmur    High cholesterol    Hypertension    Leg cramps    Lichen sclerosus et  atrophicus    Numbness and tingling in hands    Postmenopausal HRT (hormone replacement therapy) - followed by Dr. 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   VIN III (vulvar intraepithelial neoplasia III)     Tobacco History: Social History   Tobacco Use  Smoking Status Former   Current packs/day: 0.00   Average packs/day: 0.3 packs/day for 20.0 years (5.0 ttl pk-yrs)   Types: Cigarettes   Start date: 02/21/1997   Quit date: 02/21/2017   Years since quitting: 6.4  Smokeless Tobacco Never   Counseling given: Not Answered   Outpatient Medications Prior to Visit  Medication Sig Dispense Refill   Albuterol Sulfate (PROAIR HFA IN) Inhale into the lungs. 1-2 puffs as needed every 4-6 hours for cough and wheezing     atorvastatin (LIPITOR) 40 MG tablet TAKE 1 TABLET BY MOUTH EVERY DAY 90 tablet 0   docusate sodium (COLACE) 100 MG capsule      EPIPEN 2-PAK 0.3 MG/0.3ML SOAJ injection      FIBER ADULT GUMMIES PO Take by mouth.     glucose blood (FREESTYLE LITE) test strip Use to check blood sugar 3 times daily 300 each 3   KLOR-CON M20 20 MEQ tablet TAKE 1 TABLET BY MOUTH EVERY DAY 90 tablet  0   Lancets (FREESTYLE) lancets Use to check blood sugar 3 times daily 300 each 3   lansoprazole (PREVACID) 30 MG capsule TAKE 1 CAPSULE BY MOUTH ONCE DAILY 90 capsule 3   losartan-hydrochlorothiazide (HYZAAR) 100-25 MG tablet TAKE 1 TABLET BY MOUTH EVERY DAY 90 tablet 3   VITAMIN D PO Take 400 Int'l Units by mouth.     levocetirizine (XYZAL) 5 MG tablet Take 5 mg by mouth every evening.  (Patient not taking: Reported on 07/19/2023)     montelukast (SINGULAIR) 10 MG tablet Take 10 mg by mouth at bedtime. (Patient not taking: Reported on 07/19/2023)     sucralfate (CARAFATE) 1 GM/10ML suspension TAKE 10 MLS (1 G TOTAL) BY MOUTH 4 (FOUR) TIMES DAILY - WITH MEALS  AND AT BEDTIME. 414 mL 0   No facility-administered medications prior to visit.     Review of Systems:   Constitutional:   No  weight loss, night sweats,  Fevers, chills,  +fatigue, or  lassitude.  HEENT:   No headaches,  Difficulty swallowing,  Tooth/dental problems, or  Sore throat,                No sneezing, itching, ear ache, nasal congestion, post nasal drip,   CV:  No chest pain,  Orthopnea, PND, swelling in lower extremities, anasarca, dizziness, palpitations, syncope.   GI  No heartburn, indigestion, abdominal pain, nausea, vomiting, diarrhea, change in bowel habits, loss of appetite, bloody stools.   Resp: No shortness of breath with exertion or at rest.  No excess mucus, no productive cough,  No non-productive cough,  No coughing up of blood.  No change in color of mucus.  No wheezing.  No chest wall deformity  Skin: no rash or lesions.  GU: no dysuria, change in color of urine, no urgency or frequency.  No flank pain, no hematuria   MS:  No joint pain or swelling.  No decreased range of motion.  No back pain.    Physical Exam  BP 128/68   Pulse 77   Ht 5' 5.75" (1.67 m)   Wt 189 lb (85.7 kg)   SpO2 97%   BMI 30.74 kg/m   GEN: A/Ox3; pleasant , NAD, well nourished    HEENT:  [place]/AT,   NOSE-clear, THROAT-clear, no lesions, no postnasal drip or exudate noted.   NECK:  Supple w/ fair ROM; no JVD; normal carotid impulses w/o bruits; no thyromegaly or nodules palpated; no lymphadenopathy.    RESP  Clear  P & A; w/o, wheezes/ rales/ or rhonchi. no accessory muscle use, no dullness to percussion  CARD:  RRR, no m/r/g, no peripheral edema, pulses intact, no cyanosis or clubbing.  GI:   Soft & nt; nml bowel sounds; no organomegaly or masses detected.   Musco: Warm bil, no deformities or joint swelling noted.   Neuro: alert, no focal deficits noted.    Skin: Warm, no lesions or rashes    Lab Results:   BNP No results found for: "BNP"  ProBNP No results  found for: "PROBNP"  Imaging: No results found.  Administration History     None           No data to display          No results found for: "NITRICOXIDE"      Assessment & Plan:   OSA (obstructive sleep apnea) Moderate OSA-excellent control and compliance on CPAP  Improved symptom control with perceived benefit.   Plan  Patient  Instructions  Continue on CPAP At bedtime, wear all night long.  Work on healthy sleep regimen  Healthy weight loss  Do not drive if sleepy  Saline nasal spray 2 puffs Twice daily   Saline gel (AYR) At bedtime   Follow up in 6 months and As needed        Rubye Oaks, NP 07/19/2023

## 2023-07-19 NOTE — Patient Instructions (Addendum)
 Continue on CPAP At bedtime, wear all night long.  Work on healthy sleep regimen  Healthy weight loss  Do not drive if sleepy  Saline nasal spray 2 puffs Twice daily   Saline gel (AYR) At bedtime   Follow up in 6 months and As needed

## 2023-07-19 NOTE — Assessment & Plan Note (Signed)
 Moderate OSA-excellent control and compliance on CPAP  Improved symptom control with perceived benefit.   Plan  Patient Instructions  Continue on CPAP At bedtime, wear all night long.  Work on healthy sleep regimen  Healthy weight loss  Do not drive if sleepy  Saline nasal spray 2 puffs Twice daily   Saline gel (AYR) At bedtime   Follow up in 6 months and As needed

## 2023-07-20 ENCOUNTER — Ambulatory Visit (INDEPENDENT_AMBULATORY_CARE_PROVIDER_SITE_OTHER): Payer: Medicare HMO

## 2023-07-20 VITALS — BP 120/60 | HR 60 | Temp 98.3°F | Ht 65.75 in | Wt 189.8 lb

## 2023-07-20 DIAGNOSIS — Z0000 Encounter for general adult medical examination without abnormal findings: Secondary | ICD-10-CM | POA: Diagnosis not present

## 2023-07-20 NOTE — Progress Notes (Addendum)
 Subjective:   Kristin Merritt is a 63 y.o. female who presents for Medicare Annual (Subsequent) preventive examination.  Visit Complete: In person  Patient Medicare AWV questionnaire was completed by the patient on 224/2; I have confirmed that all information answered by patient is correct and no changes since this date.  Cardiac Risk Factors include: advanced age (>58men, >39 women);diabetes mellitus;hypertension     Objective:    Today's Vitals   07/20/23 0809  BP: 120/60  Pulse: 60  Temp: 98.3 F (36.8 C)  TempSrc: Oral  SpO2: 96%  Weight: 189 lb 12.8 oz (86.1 kg)  Height: 5' 5.75" (1.67 m)   Body mass index is 30.87 kg/m.     07/20/2023    8:26 AM 03/24/2022    2:47 PM 05/26/2021    3:55 PM 05/01/2020    3:07 PM 07/29/2016    1:45 PM 07/14/2016    2:18 PM 12/01/2015   10:17 AM  Advanced Directives  Does Patient Have a Medical Advance Directive? No No No No No No No  Would patient like information on creating a medical advance directive? No - Patient declined   No - Patient declined  Yes (MAU/Ambulatory/Procedural Areas - Information given) No - patient declined information    Current Medications (verified) Outpatient Encounter Medications as of 07/20/2023  Medication Sig   Albuterol Sulfate (PROAIR HFA IN) Inhale into the lungs. 1-2 puffs as needed every 4-6 hours for cough and wheezing   atorvastatin (LIPITOR) 40 MG tablet TAKE 1 TABLET BY MOUTH EVERY DAY   docusate sodium (COLACE) 100 MG capsule    EPIPEN 2-PAK 0.3 MG/0.3ML SOAJ injection    FIBER ADULT GUMMIES PO Take by mouth.   glucose blood (FREESTYLE LITE) test strip Use to check blood sugar 3 times daily   KLOR-CON M20 20 MEQ tablet TAKE 1 TABLET BY MOUTH EVERY DAY   Lancets (FREESTYLE) lancets Use to check blood sugar 3 times daily   lansoprazole (PREVACID) 30 MG capsule TAKE 1 CAPSULE BY MOUTH ONCE DAILY   levocetirizine (XYZAL) 5 MG tablet Take 5 mg by mouth every evening.  (Patient not taking: Reported on  07/19/2023)   losartan-hydrochlorothiazide (HYZAAR) 100-25 MG tablet TAKE 1 TABLET BY MOUTH EVERY DAY   montelukast (SINGULAIR) 10 MG tablet Take 10 mg by mouth at bedtime. (Patient not taking: Reported on 07/19/2023)   VITAMIN D PO Take 400 Int'l Units by mouth.   No facility-administered encounter medications on file as of 07/20/2023.    Allergies (verified) Patient has no active allergies.   History: Past Medical History:  Diagnosis Date   Allergy    Anemia    as child   Anxiety    Asthma    Constipation    Depression    GERD (gastroesophageal reflux disease)    Heart murmur    High cholesterol    Hypertension    Leg cramps    Lichen sclerosus et atrophicus    Numbness and tingling in hands    Postmenopausal HRT (hormone replacement therapy) - followed by Dr. 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   VIN III (vulvar intraepithelial neoplasia III)    Past Surgical History:  Procedure Laterality Date   BREAST EXCISIONAL BIOPSY Left 08/24/2016   times 2   EYE SURGERY     "  Lazy Muscle" x 2- one as child and another 1998   SPINE SURGERY     L5-s1   VAGINAL HYSTERECTOMY     VULVA /PERINEUM BIOPSY N/A 11/18/2015   Procedure: WIDE LOCAL EXCISION OF VULVA and MEDIAL LEFT THIGH LESION ;  Surgeon: Ok Edwards, MD;  Location: [facility];  Service: Gynecology;  Laterality: N/A;   WISDOM TOOTH EXTRACTION     Family History  Problem Relation Age of Onset   Cancer Mother        LUNG    Breast cancer Mother        Died   Esophageal cancer Mother    Colon cancer Neg Hx    Colon polyps Neg Hx    Rectal cancer Neg Hx    Stomach cancer Neg Hx    Social History   Socioeconomic History   Marital status: Widowed    Spouse name: Not on file   Number of children: Not on file   Years of education: Not on file   Highest education  level: 12th grade  Occupational History   Not on file  Tobacco Use   Smoking status: Former    Current packs/day: 0.00    Average packs/day: 0.3 packs/day for 20.0 years (5.0 ttl pk-yrs)    Types: Cigarettes    Start date: 02/21/1997    Quit date: 02/21/2017    Years since quitting: 6.4   Smokeless tobacco: Never  Vaping Use   Vaping status: Never Used  Substance and Sexual Activity   Alcohol use: Not Currently   Drug use: No   Sexual activity: Not Currently    Partners: Male    Birth control/protection: Surgical    Comment: 1st intercourse- 17, partners- 7, hysterectomy  Other Topics Concern   Not on file  Social History Narrative   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 Enjoys walking   Diet: Tries to watch what she eats. Does not eat a lot of processed foods or fast food.    Social Drivers of Corporate investment banker Strain: Low Risk  (07/20/2023)   Overall Financial Resource Strain (CARDIA)    Difficulty of Paying Living Expenses: Not hard at all  Food Insecurity: No Food Insecurity (07/20/2023)   Hunger Vital Sign    Worried About Running Out of Food in the Last Year: Never true    Ran Out of Food in the Last Year: Never true  Transportation Needs: No Transportation Needs (07/20/2023)   PRAPARE - Administrator, Civil Service (Medical): No    Lack of Transportation (Non-Medical): No  Physical Activity: Inactive (07/20/2023)   Exercise Vital Sign    Days of Exercise per Week: 0 days    Minutes of Exercise per Session: 0 min  Stress: No Stress Concern Present (07/20/2023)   [employer] of Occupational Health - Occupational Stress Questionnaire    Feeling of Stress : Not at all  Social Connections: Moderately Integrated (07/20/2023)   Social Connection and Isolation Panel [NHANES]    Frequency of Communication with Friends and Family:  More than three times a week    Frequency of Social Gatherings with Friends and Family: More than three times a week    Attends Religious Services: More than 4 times per year  Active Member of Clubs or Organizations: Yes    Attends Banker Meetings: More than 4 times per year    Marital Status: Widowed  Recent Concern: Social Connections - Moderately Isolated (07/10/2023)   Social Connection and Isolation Panel [NHANES]    Frequency of Communication with Friends and Family: More than three times a week    Frequency of Social Gatherings with Friends and Family: Twice a week    Attends Religious Services: More than 4 times per year    Active Member of [employer] or Organizations: No    Attends Engineer, structural: Not on file    Marital Status: Separated    Tobacco Counseling Counseling given: Not Answered   Clinical Intake:  Pre-visit preparation completed: Yes  Pain : No/denies pain     BMI - recorded: 30.87 Nutritional Status: BMI > 30  Obese Nutritional Risks: None Diabetes: Yes CBG done?: No Did pt. bring in CBG monitor from home?: No  How often do you need to have someone help you when you read instructions, pamphlets, or other written materials from your doctor or pharmacy?: 1 - Never  Interpreter Needed?: No  Information entered by :: Theresa Mulligan LPN   Activities of Daily Living    07/20/2023    8:25 AM 07/18/2023   10:09 AM  In your present state of health, do you have any difficulty performing the following activities:  Hearing? 0 0  Vision? 0 0  Difficulty concentrating or making decisions? 0 0  Walking or climbing stairs? 0 0  Dressing or bathing? 0 0  Doing errands, shopping? 0 0  Preparing Food and eating ? N N  Using the Toilet? N N  In the past six months, have you accidently leaked urine? N N  Do you have problems with loss of bowel control? N N  Managing your Medications? N   Managing your Finances? N N  Housekeeping or  managing your Housekeeping? N N    Patient Care Team: Shirline Frees, NP as PCP - General (Family Medicine) Sidney Ace, MD as Referring Physician (Allergy)  Indicate any recent Medical Services you may have received from other than [facility] providers in the past year (date may be approximate).     Assessment:   This is a routine wellness examination for Markeya.  Hearing/Vision screen Hearing Screening - Comments:: Denies hearing difficulties   Vision Screening - Comments:: Wears rx glasses - up to date with routine eye exams with  Dr Dione Booze   Goals Addressed               This Visit's Progress     Increase physical activity (pt-stated)        Stay Active       Depression Screen    07/20/2023    8:24 AM 04/13/2023    7:10 AM 02/24/2022    4:21 PM 05/26/2021    3:57 PM 05/07/2021    3:35 PM 05/01/2020    3:13 PM 04/02/2020    7:47 AM  PHQ 2/9 Scores  PHQ - 2 Score 0 0 0 0 0 0 0  PHQ- 9 Score   0   0 1    Fall Risk    07/20/2023    8:25 AM 07/18/2023   10:09 AM 01/03/2023    2:06 PM 02/24/2022    4:21 PM 05/26/2021    3:56 PM  Fall Risk   Falls in the past year? 0 0 0 0 0  Number falls in past yr: 0 0 0 0   Injury with Fall? 0 0 0 0   Risk for fall due to : No Fall Risks  No Fall Risks No Fall Risks Medication side effect  Follow up Falls prevention discussed;Falls evaluation completed  Falls evaluation completed Falls evaluation completed Falls evaluation completed;Education provided;Falls prevention discussed    MEDICARE RISK AT HOME: Medicare Risk at Home Any stairs in or around the home?: Yes If so, are there any without handrails?: No Home free of loose throw rugs in walkways, pet beds, electrical cords, etc?: No Adequate lighting in your home to reduce risk of falls?: Yes Life alert?: No Use of a cane, walker or w/c?: No Grab bars in the bathroom?: No Shower chair or bench in shower?: No Elevated toilet seat or a handicapped toilet?: No  TIMED UP AND  GO:  Was the test performed?  Yes  Length of time to ambulate 10 feet: 10 sec Gait steady and fast without use of assistive device    Cognitive Function:        07/20/2023    8:26 AM 05/26/2021    4:00 PM  6CIT Screen  What Year? 0 points 0 points  What month? 0 points 0 points  What time? 0 points 0 points  Count back from 20 0 points 0 points  Months in reverse 0 points 0 points  Repeat phrase 0 points 0 points  Total Score 0 points 0 points    Immunizations Immunization History  Administered Date(s) Administered   Influenza, High Dose Seasonal PF 05/04/2013, 07/27/2017   Influenza,inj,Quad PF,6+ Mos 01/29/2013, 02/13/2014, 02/21/2015, 03/19/2016, 03/09/2017, 01/23/2021, 04/02/2022, 03/18/2023   Influenza-Unspecified 02/21/2018, 03/07/2020   Moderna Covid-19 Vaccine Bivalent Booster 70yrs & up 05/08/2021   Moderna Sars-Covid-2 Vaccination 08/20/2019, 09/12/2019, 03/03/2020   PNEUMOCOCCAL CONJUGATE-20 01/15/2021   PPD Test 06/03/2014, 08/19/2015, 10/06/2016   Respiratory Syncytial Virus Vaccine,Recomb Aduvanted(Arexvy) 05/10/2022   Tdap 01/09/2013    TDAP status: Due, Education has been provided regarding the importance of this vaccine. Advised may receive this vaccine at local pharmacy or Health Dept. Aware to provide a copy of the vaccination record if obtained from local pharmacy or Health Dept. Verbalized acceptance and understanding.  Flu Vaccine status: Up to date  Pneumococcal vaccine status: Up to date  Covid-19 vaccine status: Declined, Education has been provided regarding the importance of this vaccine but patient still declined. Advised may receive this vaccine at local pharmacy or Health Dept.or vaccine clinic. Aware to provide a copy of the vaccination record if obtained from local pharmacy or Health Dept. Verbalized acceptance and understanding.  Qualifies for Shingles Vaccine? Yes   Zostavax completed No   Shingrix Completed?: No.    Education has been  provided regarding the importance of this vaccine. Patient has been advised to call insurance company to determine out of pocket expense if they have not yet received this vaccine. Advised may also receive vaccine at local pharmacy or Health Dept. Verbalized acceptance and understanding.  Screening Tests Health Maintenance  Topic Date Due   Zoster Vaccines- Shingrix (1 of 2) Never done   DTaP/Tdap/Td (2 - Td or Tdap) 01/10/2023   COVID-19 Vaccine (5 - 2024-25 season) 01/23/2023   MAMMOGRAM  03/24/2024   Diabetic kidney evaluation - eGFR measurement  04/12/2024   Diabetic kidney evaluation - Urine ACR  04/12/2024   Cervical Cancer Screening (HPV/Pap Cotest)  06/16/2024   Medicare Annual Wellness (AWV)  07/19/2024   Colonoscopy  08/14/2026   Pneumococcal Vaccine 57-65 Years old  Completed   INFLUENZA VACCINE  Completed   Hepatitis C Screening  Completed   HIV Screening  Completed   HPV VACCINES  Aged Out    Health Maintenance  Health Maintenance Due  Topic Date Due   Zoster Vaccines- Shingrix (1 of 2) Never done   DTaP/Tdap/Td (2 - Td or Tdap) 01/10/2023   COVID-19 Vaccine (5 - 2024-25 season) 01/23/2023    Colorectal cancer screening: Type of screening: Colonoscopy. Completed 08/13/16. Repeat every 10 years  Mammogram status: Completed 03/25/23. Repeat every year       Additional Screening:  Hepatitis C Screening: does qualify; Completed 01/06/23  Vision Screening: Recommended annual ophthalmology exams for early detection of glaucoma and other disorders of the eye. Is the patient up to date with their annual eye exam?  Yes  Who is the provider or what is the name of the office in which the patient attends annual eye exams? Dr Dione Booze If pt is not established with a provider, would they like to be referred to a provider to establish care? No .   Dental Screening: Recommended annual dental exams for proper oral hygiene    Community Resource Referral / Chronic Care  Management:  CRR required this visit?  No   CCM required this visit?  No     Plan:     I have personally reviewed and noted the following in the patient's chart:   Medical and social history Use of alcohol, tobacco or illicit drugs  Current medications and supplements including opioid prescriptions. Patient is not currently taking opioid prescriptions. Functional ability and status Nutritional status Physical activity Advanced directives List of other physicians Hospitalizations, surgeries, and ER visits in previous 12 months Vitals Screenings to include cognitive, depression, and falls Referrals and appointments  In addition, I have reviewed and discussed with patient certain preventive protocols, quality metrics, and best practice recommendations. A written personalized care plan for preventive services as well as general preventive health recommendations were provided to patient.     Tillie Rung, LPN   1/61/0960   After Visit Summary: (In Person-Printed) AVS printed and given to the patient  Nurse Notes: None

## 2023-07-20 NOTE — Patient Instructions (Addendum)
 Ms. Ganger , Thank you for taking time to come for your Medicare Wellness Visit. I appreciate your ongoing commitment to your health goals. Please review the following plan we discussed and let me know if I can assist you in the future.   Referrals/Orders/Follow-Ups/Clinician Recommendations: Increase physical activity  This is a list of the screening recommended for you and due dates:  Health Maintenance  Topic Date Due   Zoster (Shingles) Vaccine (1 of 2) Never done   DTaP/Tdap/Td vaccine (2 - Td or Tdap) 01/10/2023   COVID-19 Vaccine (5 - 2024-25 season) 01/23/2023   Mammogram  03/24/2024   Yearly kidney function blood test for diabetes  04/12/2024   Yearly kidney health urinalysis for diabetes  04/12/2024   Pap with HPV screening  06/16/2024   Medicare Annual Wellness Visit  07/19/2024   Colon Cancer Screening  08/14/2026   Pneumococcal Vaccination  Completed   Flu Shot  Completed   Hepatitis C Screening  Completed   HIV Screening  Completed   HPV Vaccine  Aged Out    Advanced directives: (Declined) Advance directive discussed with you today. Even though you declined this today, please call our office should you change your mind, and we can give you the proper paperwork for you to fill out.  Next Medicare Annual Wellness Visit scheduled for next year: Yes

## 2023-07-21 ENCOUNTER — Ambulatory Visit (INDEPENDENT_AMBULATORY_CARE_PROVIDER_SITE_OTHER): Payer: Medicare HMO | Admitting: Adult Health

## 2023-07-21 VITALS — BP 120/82 | HR 63 | Temp 98.4°F | Ht 65.0 in | Wt 192.0 lb

## 2023-07-21 DIAGNOSIS — E118 Type 2 diabetes mellitus with unspecified complications: Secondary | ICD-10-CM

## 2023-07-21 DIAGNOSIS — Z7985 Long-term (current) use of injectable non-insulin antidiabetic drugs: Secondary | ICD-10-CM | POA: Diagnosis not present

## 2023-07-21 DIAGNOSIS — I10 Essential (primary) hypertension: Secondary | ICD-10-CM

## 2023-07-21 LAB — POCT GLYCOSYLATED HEMOGLOBIN (HGB A1C): Hemoglobin A1C: 5.4 % (ref 4.0–5.6)

## 2023-07-21 MED ORDER — TIRZEPATIDE 7.5 MG/0.5ML ~~LOC~~ SOAJ: 7.5000 mg | SUBCUTANEOUS | 0 refills | Status: DC

## 2023-07-21 NOTE — Patient Instructions (Signed)
 Health Maintenance Due  Topic Date Due   Zoster Vaccines- Shingrix (1 of 2) Never done   DTaP/Tdap/Td (2 - Td or Tdap) 01/10/2023   COVID-19 Vaccine (5 - 2024-25 season) 01/23/2023       07/20/2023    8:24 AM 04/13/2023    7:10 AM 02/24/2022    4:21 PM  Depression screen PHQ 2/9  Decreased Interest 0 0 0  Down, Depressed, Hopeless 0 0 0  PHQ - 2 Score 0 0 0  Altered sleeping   0  Tired, decreased energy   0  Change in appetite   0  Feeling bad or failure about yourself    0  Trouble concentrating   0  Moving slowly or fidgety/restless   0  Suicidal thoughts   0  PHQ-9 Score   0  Difficult doing work/chores   Not difficult at all

## 2023-07-21 NOTE — Progress Notes (Signed)
 Subjective:    Patient ID: Kristin Merritt, female    DOB: [birth year], 64 y.o.   MRN: [id number]  HPI 63 year old female who  has a past medical history of Allergy, Anemia, Anxiety, Asthma, Constipation, Depression, GERD (gastroesophageal reflux disease), Heart murmur, High cholesterol, Hypertension, Leg cramps, Lichen sclerosus et atrophicus, Numbness and tingling in hands, Postmenopausal HRT (hormone replacement therapy) - followed by Dr. Lily Peer in gyn (07/11/2012), Swelling of both hands, Uterine cancer (HCC) (1991), UTI (urinary tract infection), VIN III (vulvar intraepithelial neoplasia III), and VIN III (vulvar intraepithelial neoplasia III).  DM Type 2 - In November 2023 she was started on [facility].  Currently on 10 mg. She stopped this dose about 4 weeks ago due to weight dropping down to 173 lbs and she did not like the way she looked. She has been tolerating this medication well with minimal nausea. She is exercising three times a week and eating healthier.  Lab Results  Component Value Date   HGBA1C 5.4 07/21/2023   HGBA1C 5.8 04/13/2023   HGBA1C 5.3 10/06/2022   Wt Readings from Last 3 Encounters:  07/21/23 192 lb (87.1 kg)  07/20/23 189 lb 12.8 oz (86.1 kg)  07/19/23 189 lb (85.7 kg)   Hypertension Managed with Hyzaar 100-25 mg daily. She has been checking her BP at home with readings in the 120-130's/70-80. She denies dizziness, lightheadedness, blurred vision.  BP Readings from Last 3 Encounters:  07/21/23 120/82  07/20/23 120/60  07/19/23 128/68    Review of Systems See HPI   Past Medical History:  Diagnosis Date   Allergy    Anemia    as child   Anxiety    Asthma    Constipation    Depression    GERD (gastroesophageal reflux disease)    Heart murmur    High cholesterol    Hypertension    Leg cramps    Lichen sclerosus et atrophicus    Numbness and tingling in hands    Postmenopausal HRT (hormone replacement therapy) - followed by Dr. Lily Peer in gyn  07/11/2012   Swelling of both hands    Uterine cancer (HCC) 1991   UTI (urinary tract infection)    VIN III (vulvar intraepithelial neoplasia III)    left labia majora   VIN III (vulvar intraepithelial neoplasia III)     Social History   Socioeconomic History   Marital status: Widowed    Spouse name: Not on file   Number of children: Not on file   Years of education: Not on file   Highest education level: 12th grade  Occupational History   Not on file  Tobacco Use   Smoking status: Former    Current packs/day: 0.00    Average packs/day: 0.3 packs/day for 20.0 years (5.0 ttl pk-yrs)    Types: Cigarettes    Start date: 02/21/1997    Quit date: 02/21/2017    Years since quitting: 6.4   Smokeless tobacco: Never  Vaping Use   Vaping status: Never Used  Substance and Sexual Activity   Alcohol use: Not Currently   Drug use: No   Sexual activity: Not Currently    Partners: Male    Birth control/protection: Surgical    Comment: 1st intercourse- 17, partners- 7, hysterectomy  Other Topics Concern   Not on file  Social History Narrative   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 Enjoys walking   Diet: Tries to watch what she eats. Does not eat a lot of processed foods or fast food.    Social Drivers of Corporate investment banker Strain: Low Risk  (07/20/2023)   Overall Financial Resource Strain (CARDIA)    Difficulty of Paying Living Expenses: Not hard at all  Food Insecurity: No Food Insecurity (07/20/2023)   Hunger Vital Sign    Worried About Running Out of Food in the Last Year: Never true    Ran Out of Food in the Last Year: Never true  Transportation Needs: No Transportation Needs (07/20/2023)   PRAPARE - Administrator, Civil Service (Medical): No    Lack of Transportation (Non-Medical): No  Physical Activity: Inactive (07/20/2023)   Exercise  Vital Sign    Days of Exercise per Week: 0 days    Minutes of Exercise per Session: 0 min  Stress: No Stress Concern Present (07/20/2023)   [employer] of Occupational Health - Occupational Stress Questionnaire    Feeling of Stress : Not at all  Social Connections: Moderately Integrated (07/20/2023)   Social Connection and Isolation Panel [NHANES]    Frequency of Communication with Friends and Family: More than three times a week    Frequency of Social Gatherings with Friends and Family: More than three times a week    Attends Religious Services: More than 4 times per year    Active Member of [employer] or Organizations: Yes    Attends Banker Meetings: More than 4 times per year    Marital Status: Widowed  Recent Concern: Social Connections - Moderately Isolated (07/10/2023)   Social Connection and Isolation Panel [NHANES]    Frequency of Communication with Friends and Family: More than three times a week    Frequency of Social Gatherings with Friends and Family: Twice a week    Attends Religious Services: More than 4 times per year    Active Member of [employer] or Organizations: No    Attends Engineer, structural: Not on file    Marital Status: Separated  Intimate Partner Violence: Not At Risk (07/20/2023)   Humiliation, Afraid, Rape, and Kick questionnaire    Fear of Current or Ex-Partner: No    Emotionally Abused: No    Physically Abused: No    Sexually Abused: No    Past Surgical History:  Procedure Laterality Date   BREAST EXCISIONAL BIOPSY Left 08/24/2016   times 2   EYE SURGERY     "Lazy Muscle" x 2- one as child and another 1998   SPINE SURGERY     L5-s1   VAGINAL HYSTERECTOMY     VULVA /PERINEUM BIOPSY N/A 11/18/2015   Procedure: WIDE LOCAL EXCISION OF VULVA and MEDIAL LEFT THIGH LESION ;  Surgeon: Ok Edwards, MD;  Location: [facility];  Service: Gynecology;  Laterality: N/A;   WISDOM TOOTH EXTRACTION      Family History  Problem Relation Age of  Onset   Cancer Mother        LUNG    Breast cancer Mother        Died   Esophageal cancer Mother    Colon cancer Neg Hx    Colon polyps Neg Hx    Rectal cancer Neg Hx    Stomach cancer Neg Hx     No Active Allergies  Current Outpatient Medications on File  Prior to Visit  Medication Sig Dispense Refill   Albuterol Sulfate (PROAIR HFA IN) Inhale into the lungs. 1-2 puffs as needed every 4-6 hours for cough and wheezing     atorvastatin (LIPITOR) 40 MG tablet TAKE 1 TABLET BY MOUTH EVERY DAY 90 tablet 0   docusate sodium (COLACE) 100 MG capsule      EPIPEN 2-PAK 0.3 MG/0.3ML SOAJ injection      FIBER ADULT GUMMIES PO Take by mouth.     glucose blood (FREESTYLE LITE) test strip Use to check blood sugar 3 times daily 300 each 3   KLOR-CON M20 20 MEQ tablet TAKE 1 TABLET BY MOUTH EVERY DAY 90 tablet 0   Lancets (FREESTYLE) lancets Use to check blood sugar 3 times daily 300 each 3   lansoprazole (PREVACID) 30 MG capsule TAKE 1 CAPSULE BY MOUTH ONCE DAILY 90 capsule 3   levocetirizine (XYZAL) 5 MG tablet Take 5 mg by mouth every evening.     losartan-hydrochlorothiazide (HYZAAR) 100-25 MG tablet TAKE 1 TABLET BY MOUTH EVERY DAY 90 tablet 3   montelukast (SINGULAIR) 10 MG tablet Take 10 mg by mouth at bedtime.     VITAMIN D PO Take 400 Int'l Units by mouth.     No current facility-administered medications on file prior to visit.    BP 120/82   Pulse 63   Temp 98.4 F (36.9 C) (Oral)   Ht 5\' 5"  (1.651 m)   Wt 192 lb (87.1 kg)   SpO2 99%   BMI 31.95 kg/m       Objective:   Physical Exam Vitals and nursing note reviewed.  Constitutional:      Appearance: Normal appearance. She is obese.  Cardiovascular:     Rate and Rhythm: Normal rate and regular rhythm.     Pulses: Normal pulses.     Heart sounds: Normal heart sounds.  Pulmonary:     Effort: Pulmonary effort is normal.     Breath sounds: Normal breath sounds.  Skin:    General: Skin is warm and dry.  Neurological:      General: No focal deficit present.     Mental Status: She is alert and oriented to person, place, and time.  Psychiatric:        Mood and Affect: Mood normal.        Behavior: Behavior normal.        Thought Content: Thought content normal.        Judgment: Judgment normal.       Assessment & Plan:  1. Controlled type 2 diabetes mellitus with complication, without long-term current use of insulin (HCC) (Primary)  - POC HgB A1c- 5.4  - will restart on Mounjaro 7.5 mg weekly.  - Follow up in 3 months  - tirzepatide (MOUNJARO) 7.5 MG/0.5ML Pen; Inject 7.5 mg into the skin once a week.  Dispense: 6 mL; Refill: 0  2. Long-term current use of injectable noninsulin antidiabetic medication  - POC HgB A1c - tirzepatide (MOUNJARO) 7.5 MG/0.5ML Pen; Inject 7.5 mg into the skin once a week.  Dispense: 6 mL; Refill: 0  3. Essential hypertension, benign - well controlled. No change in medication   Shirline Frees, NP

## 2023-07-27 ENCOUNTER — Ambulatory Visit: Payer: Medicare HMO | Admitting: Adult Health

## 2023-08-23 ENCOUNTER — Other Ambulatory Visit: Payer: Self-pay

## 2023-08-23 DIAGNOSIS — I10 Essential (primary) hypertension: Secondary | ICD-10-CM

## 2023-08-23 MED ORDER — LOSARTAN POTASSIUM-HCTZ 100-25 MG PO TABS: 1.0000 | ORAL_TABLET | Freq: Every day | ORAL | 3 refills | Status: AC

## 2023-08-23 MED ORDER — POTASSIUM CHLORIDE CRYS ER 20 MEQ PO TBCR: 20.0000 meq | EXTENDED_RELEASE_TABLET | Freq: Every day | ORAL | 0 refills | Status: DC

## 2023-08-23 MED ORDER — LANSOPRAZOLE 30 MG PO CPDR: 30.0000 mg | DELAYED_RELEASE_CAPSULE | Freq: Every day | ORAL | 3 refills | Status: AC

## 2023-08-25 ENCOUNTER — Other Ambulatory Visit: Payer: Self-pay | Admitting: Adult Health

## 2023-08-25 DIAGNOSIS — I10 Essential (primary) hypertension: Secondary | ICD-10-CM

## 2023-08-26 NOTE — Telephone Encounter (Signed)
Please see pharmacy note and advise.

## 2023-09-14 ENCOUNTER — Ambulatory Visit: Admitting: Adult Health

## 2023-09-14 ENCOUNTER — Encounter: Payer: Self-pay | Admitting: Adult Health

## 2023-09-14 VITALS — BP 120/80 | HR 64 | Temp 98.4°F | Ht 65.0 in | Wt 187.0 lb

## 2023-09-14 DIAGNOSIS — M5441 Lumbago with sciatica, right side: Secondary | ICD-10-CM | POA: Diagnosis not present

## 2023-09-14 MED ORDER — IBUPROFEN 800 MG PO TABS: 800.0000 mg | ORAL_TABLET | Freq: Three times a day (TID) | ORAL | 0 refills | Status: DC | PRN

## 2023-09-14 MED ORDER — METHYLPREDNISOLONE 4 MG PO TBPK: ORAL_TABLET | ORAL | 0 refills | Status: DC

## 2023-09-14 MED ORDER — CYCLOBENZAPRINE HCL 10 MG PO TABS: 10.0000 mg | ORAL_TABLET | Freq: Every day | ORAL | 0 refills | Status: DC

## 2023-09-14 NOTE — Progress Notes (Signed)
 Subjective:    Patient ID: Kristin Merritt, female    DOB: 11-Mar-1961, 63 y.o.   MRN: [id number]  HPI 63 year old female who  has a past medical history of Allergy, Anemia, Anxiety, Asthma, Constipation, Depression, GERD (gastroesophageal reflux disease), Heart murmur, High cholesterol, Hypertension, Leg cramps, Lichen sclerosus et atrophicus, Numbness and tingling in hands, Postmenopausal HRT (hormone replacement therapy) - followed by Dr. Annitta Kindler in gyn (07/11/2012), Swelling of both hands, Uterine cancer (HCC) (1991), UTI (urinary tract infection), VIN III (vulvar intraepithelial neoplasia III), and VIN III (vulvar intraepithelial neoplasia III).  She is being evaluated today for an acute issue.  She reports low back pain for the last 3 days.  Pain is located bilaterally, worse on the right with intermittent radiating pain down her right leg.  She believes that her symptoms started after picking up a heavy box.  Pain is worse with bending and twisting motions as well as changing positions such as going from sitting to a standing position.  She has not had any issues with bowel or bladder incontinence at home she has been using a heating pad and Motrin  800 which does help alleviate some of the pain.  Review of Systems See HPI   Past Medical History:  Diagnosis Date   Allergy    Anemia    as child   Anxiety    Asthma    Constipation    Depression    GERD (gastroesophageal reflux disease)    Heart murmur    High cholesterol    Hypertension    Leg cramps    Lichen sclerosus et atrophicus    Numbness and tingling in hands    Postmenopausal HRT (hormone replacement therapy) - followed by Dr. Annitta Kindler in gyn 07/11/2012   Swelling of both hands    Uterine cancer (HCC) 1991   UTI (urinary tract infection)    VIN III (vulvar intraepithelial neoplasia III)    left labia majora   VIN III (vulvar intraepithelial neoplasia III)     Social History   Socioeconomic History   Marital  status: Widowed    Spouse name: Not on file   Number of children: Not on file   Years of education: Not on file   Highest education level: 12th grade  Occupational History   Not on file  Tobacco Use   Smoking status: Former    Current packs/day: 0.00    Average packs/day: 0.3 packs/day for 20.0 years (5.0 ttl pk-yrs)    Types: Cigarettes    Start date: 02/21/1997    Quit date: 02/21/2017    Years since quitting: 6.5   Smokeless tobacco: Never  Vaping Use   Vaping status: Never Used  Substance and Sexual Activity   Alcohol  use: Not Currently   Drug use: No   Sexual activity: Not Currently    Partners: Male    Birth control/protection: Surgical    Comment: 1st intercourse- 17, partners- 7, hysterectomy  Other Topics Concern   Not on file  Social History Narrative   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 Enjoys walking   Diet: Tries to watch what she eats. Does not eat a lot of processed foods or fast food.    Social Drivers of Corporate investment banker Strain: Low Risk  (  07/20/2023)   Overall Financial Resource Strain (CARDIA)    Difficulty of Paying Living Expenses: Not hard at all  Food Insecurity: No Food Insecurity (07/20/2023)   Hunger Vital Sign    Worried About Running Out of Food in the Last Year: Never true    Ran Out of Food in the Last Year: Never true  Transportation Needs: No Transportation Needs (07/20/2023)   PRAPARE - Administrator, Civil Service (Medical): No    Lack of Transportation (Non-Medical): No  Physical Activity: Inactive (07/20/2023)   Exercise Vital Sign    Days of Exercise per Week: 0 days    Minutes of Exercise per Session: 0 min  Stress: No Stress Concern Present (07/20/2023)   [employer] of Occupational Health - Occupational Stress Questionnaire    Feeling of Stress : Not at all  Social Connections:  Moderately Integrated (07/20/2023)   Social Connection and Isolation Panel [NHANES]    Frequency of Communication with Friends and Family: More than three times a week    Frequency of Social Gatherings with Friends and Family: More than three times a week    Attends Religious Services: More than 4 times per year    Active Member of [employer] or Organizations: Yes    Attends Banker Meetings: More than 4 times per year    Marital Status: Widowed  Recent Concern: Social Connections - Moderately Isolated (07/10/2023)   Social Connection and Isolation Panel [NHANES]    Frequency of Communication with Friends and Family: More than three times a week    Frequency of Social Gatherings with Friends and Family: Twice a week    Attends Religious Services: More than 4 times per year    Active Member of [employer] or Organizations: No    Attends Engineer, structural: Not on file    Marital Status: Separated  Intimate Partner Violence: Not At Risk (07/20/2023)   Humiliation, Afraid, Rape, and Kick questionnaire    Fear of Current or Ex-Partner: No    Emotionally Abused: No    Physically Abused: No    Sexually Abused: No    Past Surgical History:  Procedure Laterality Date   BREAST EXCISIONAL BIOPSY Left 08/24/2016   times 2   EYE SURGERY     "Lazy Muscle" x 2- one as child and another 1998   SPINE SURGERY     L5-s1   VAGINAL HYSTERECTOMY     VULVA /PERINEUM BIOPSY N/A 11/18/2015   Procedure: WIDE LOCAL EXCISION OF VULVA and MEDIAL LEFT THIGH LESION ;  Surgeon: Davia Erps, MD;  Location: [facility];  Service: Gynecology;  Laterality: N/A;   WISDOM TOOTH EXTRACTION      Family History  Problem Relation Age of Onset   Cancer Mother        LUNG    Breast cancer Mother        Died   Esophageal cancer Mother    Colon cancer Neg Hx    Colon polyps Neg Hx    Rectal cancer Neg Hx    Stomach cancer Neg Hx     No Active Allergies  Current Outpatient Medications on File Prior  to Visit  Medication Sig Dispense Refill   Albuterol Sulfate (PROAIR HFA IN) Inhale into the lungs. 1-2 puffs as needed every 4-6 hours for cough and wheezing     atorvastatin  (LIPITOR) 40 MG tablet TAKE 1 TABLET BY MOUTH EVERY DAY 90 tablet 0   docusate sodium (  COLACE) 100 MG capsule      EPIPEN 2-PAK 0.3 MG/0.3ML SOAJ injection      FIBER ADULT GUMMIES PO Take by mouth.     glucose blood (FREESTYLE LITE) test strip Use to check blood sugar 3 times daily 300 each 3   Lancets (FREESTYLE) lancets Use to check blood sugar 3 times daily 300 each 3   lansoprazole  (PREVACID ) 30 MG capsule Take 1 capsule (30 mg total) by mouth daily. 90 capsule 3   levocetirizine (XYZAL) 5 MG tablet Take 5 mg by mouth every evening.     losartan -hydrochlorothiazide (HYZAAR) 100-25 MG tablet Take 1 tablet by mouth daily. 90 tablet 3   montelukast (SINGULAIR) 10 MG tablet Take 10 mg by mouth at bedtime.     potassium chloride  (KLOR-CON  M) 10 MEQ tablet Take 1 tablet (10 mEq total) by mouth 2 (two) times daily. Take 2 tabs together daily 180 tablet 1   tirzepatide  (MOUNJARO ) 7.5 MG/0.5ML Pen Inject 7.5 mg into the skin once a week. 6 mL 0   VITAMIN D PO Take 400 Int'l Units by mouth.     No current facility-administered medications on file prior to visit.    BP 120/80   Pulse 64   Temp 98.4 F (36.9 C) (Oral)   Ht 5\' 5"  (1.651 m)   Wt 187 lb (84.8 kg)   SpO2 97%   BMI 31.12 kg/m       Objective:   Physical Exam Vitals and nursing note reviewed.  Constitutional:      Appearance: Normal appearance.  Musculoskeletal:        General: Tenderness present.     Lumbar back: Tenderness present. No swelling, spasms or bony tenderness. Decreased range of motion.       Back:     Comments: No lower spinal pain with palpation    Skin:    General: Skin is warm and dry.  Neurological:     General: No focal deficit present.     Mental Status: She is alert and oriented to person, place, and time.  Psychiatric:         Mood and Affect: Mood normal.        Behavior: Behavior normal.        Thought Content: Thought content normal.        Judgment: Judgment normal.        Assessment & Plan:  1. Acute bilateral low back pain with right-sided sciatica (Primary) - Appears to be muscular in origin.  Will treat with Flexeril  and Medrol  Dosepak.  She does understand the sedating aspect of muscle relaxers.  Also advised on warm compress while at rest as well as gentle stretching exercises.  Follow-up early next week if no improvement in symptoms or sooner if symptoms worsen - cyclobenzaprine  (FLEXERIL ) 10 MG tablet; Take 1 tablet (10 mg total) by mouth at bedtime.  Dispense: 15 tablet; Refill: 0 - methylPREDNISolone  (MEDROL  DOSEPAK) 4 MG TBPK tablet; Take as directed  Dispense: 21 tablet; Refill: 0 - ibuprofen  (ADVIL ) 800 MG tablet; Take 1 tablet (800 mg total) by mouth every 8 (eight) hours as needed.  Dispense: 30 tablet; Refill: 0  Alto Atta, NP

## 2023-09-14 NOTE — Patient Instructions (Signed)
 I think you strained your back. I am going to send in Motrin  800 mg, steroids and a muscle relaxer Continue to use a warm compress and gentle stretching exercises.   Follow up next week if not any better

## 2023-09-15 ENCOUNTER — Telehealth: Payer: Self-pay | Admitting: *Deleted

## 2023-09-15 NOTE — Telephone Encounter (Signed)
 Copied from CRM [id number]. Topic: Clinical - Request for Lab/Test Order >> Sep 14, 2023  3:59 PM Alyse July wrote: Reason for CRM: Raina Bunting would like a kidney function test order for patient at next visit

## 2023-09-15 NOTE — Telephone Encounter (Signed)
 Noted.

## 2023-10-05 ENCOUNTER — Telehealth: Admitting: Adult Health

## 2023-10-05 DIAGNOSIS — J0100 Acute maxillary sinusitis, unspecified: Secondary | ICD-10-CM

## 2023-10-05 MED ORDER — DOXYCYCLINE HYCLATE 100 MG PO CAPS: 100.0000 mg | ORAL_CAPSULE | Freq: Two times a day (BID) | ORAL | 0 refills | Status: DC

## 2023-10-05 MED ORDER — FLUCONAZOLE 150 MG PO TABS: ORAL_TABLET | ORAL | 0 refills | Status: DC

## 2023-10-05 NOTE — Progress Notes (Signed)
 Virtual Visit via Video Note  I connected with Kristin Merritt on 10/05/23 at  7:00 AM EDT by a video enabled telemedicine application and verified that I am speaking with the correct person using two identifiers.  Location patient: home Location provider:work or home office Persons participating in the virtual visit: patient, provider  I discussed the limitations of evaluation and management by telemedicine and the availability of in person appointments. The patient expressed understanding and agreed to proceed.   HPI:  63 year old female who  has a past medical history of Allergy, Anemia, Anxiety, Asthma, Constipation, Depression, GERD (gastroesophageal reflux disease), Heart murmur, High cholesterol, Hypertension, Leg cramps, Lichen sclerosus et atrophicus, Numbness and tingling in hands, Postmenopausal HRT (hormone replacement therapy) - followed by Dr. Annitta Kindler in gyn (07/11/2012), Swelling of both hands, Uterine cancer (HCC) (1991), UTI (urinary tract infection), VIN III (vulvar intraepithelial neoplasia III), and VIN III (vulvar intraepithelial neoplasia III).  She is being evaluated today for an acute issue. She reports that for the last week she has been experiencing sinus pain, rhinorrhea with discolored mucus, semi productive cough, and feeling acutely ill. She has been using Mucinex Nasal Spray ad Dayquil which helps to some degree. She has not had any fevers, chills, SOB.   ROS: See pertinent positives and negatives per HPI.  Past Medical History:  Diagnosis Date   Allergy    Anemia    as child   Anxiety    Asthma    Constipation    Depression    GERD (gastroesophageal reflux disease)    Heart murmur    High cholesterol    Hypertension    Leg cramps    Lichen sclerosus et atrophicus    Numbness and tingling in hands    Postmenopausal HRT (hormone replacement therapy) - followed by Dr. Annitta Kindler in gyn 07/11/2012   Swelling of both hands    Uterine cancer (HCC) 1991    UTI (urinary tract infection)    VIN III (vulvar intraepithelial neoplasia III)    left labia majora   VIN III (vulvar intraepithelial neoplasia III)     Past Surgical History:  Procedure Laterality Date   BREAST EXCISIONAL BIOPSY Left 08/24/2016   times 2   EYE SURGERY     "Lazy Muscle" x 2- one as child and another 1998   SPINE SURGERY     L5-s1   VAGINAL HYSTERECTOMY     VULVA /PERINEUM BIOPSY N/A 11/18/2015   Procedure: WIDE LOCAL EXCISION OF VULVA and MEDIAL LEFT THIGH LESION ;  Surgeon: Davia Erps, MD;  Location: [facility];  Service: Gynecology;  Laterality: N/A;   WISDOM TOOTH EXTRACTION      Family History  Problem Relation Age of Onset   Cancer Mother        LUNG    Breast cancer Mother        Died   Esophageal cancer Mother    Colon cancer Neg Hx    Colon polyps Neg Hx    Rectal cancer Neg Hx    Stomach cancer Neg Hx        Current Outpatient Medications:    Albuterol Sulfate (PROAIR HFA IN), Inhale into the lungs. 1-2 puffs as needed every 4-6 hours for cough and wheezing, Disp: , Rfl:    atorvastatin  (LIPITOR) 40 MG tablet, TAKE 1 TABLET BY MOUTH EVERY DAY, Disp: 90 tablet, Rfl: 0   cyclobenzaprine  (FLEXERIL ) 10 MG tablet, Take 1 tablet (10 mg total) by mouth  at bedtime., Disp: 15 tablet, Rfl: 0   docusate sodium (COLACE) 100 MG capsule, , Disp: , Rfl:    EPIPEN 2-PAK 0.3 MG/0.3ML SOAJ injection, , Disp: , Rfl:    FIBER ADULT GUMMIES PO, Take by mouth., Disp: , Rfl:    glucose blood (FREESTYLE LITE) test strip, Use to check blood sugar 3 times daily, Disp: 300 each, Rfl: 3   ibuprofen  (ADVIL ) 800 MG tablet, Take 1 tablet (800 mg total) by mouth every 8 (eight) hours as needed., Disp: 30 tablet, Rfl: 0   Lancets (FREESTYLE) lancets, Use to check blood sugar 3 times daily, Disp: 300 each, Rfl: 3   lansoprazole  (PREVACID ) 30 MG capsule, Take 1 capsule (30 mg total) by mouth daily., Disp: 90 capsule, Rfl: 3   levocetirizine (XYZAL) 5 MG tablet, Take 5 mg by  mouth every evening., Disp: , Rfl:    losartan -hydrochlorothiazide (HYZAAR) 100-25 MG tablet, Take 1 tablet by mouth daily., Disp: 90 tablet, Rfl: 3   methylPREDNISolone  (MEDROL  DOSEPAK) 4 MG TBPK tablet, Take as directed, Disp: 21 tablet, Rfl: 0   montelukast (SINGULAIR) 10 MG tablet, Take 10 mg by mouth at bedtime., Disp: , Rfl:    potassium chloride  (KLOR-CON  M) 10 MEQ tablet, Take 1 tablet (10 mEq total) by mouth 2 (two) times daily. Take 2 tabs together daily, Disp: 180 tablet, Rfl: 1   tirzepatide  (MOUNJARO ) 7.5 MG/0.5ML Pen, Inject 7.5 mg into the skin once a week., Disp: 6 mL, Rfl: 0   VITAMIN D PO, Take 400 Int'l Units by mouth., Disp: , Rfl:   EXAM:  VITALS per patient if applicable:  GENERAL: alert, oriented, appears well and in no acute distress  HEENT: atraumatic, conjunttiva clear, no obvious abnormalities on inspection of external nose and ears  NECK: normal movements of the head and neck  LUNGS: on inspection no signs of respiratory distress, breathing rate appears normal, no obvious gross SOB, gasping or wheezing  CV: no obvious cyanosis  MS: moves all visible extremities without noticeable abnormality  PSYCH/NEURO: pleasant and cooperative, no obvious depression or anxiety, speech and thought processing grossly intact  ASSESSMENT AND PLAN:  Discussed the following assessment and plan:  1. Acute non-recurrent maxillary sinusitis (Primary) - Will treat due to symptoms and duration. She can stop Mucinex nasal spray and use Normal saline Spray and Flonase .  - Follow up if not resolved by the end of abx therapy  - doxycycline  (VIBRAMYCIN ) 100 MG capsule; Take 1 capsule (100 mg total) by mouth 2 (two) times daily.  Dispense: 14 capsule; Refill: 0 - fluconazole  (DIFLUCAN ) 150 MG tablet; Take one tab today and another in 3 days  Dispense: 2 tablet; Refill: 0      I discussed the assessment and treatment plan with the patient. The patient was provided an opportunity to  ask questions and all were answered. The patient agreed with the plan and demonstrated an understanding of the instructions.   The patient was advised to call back or seek an in-person evaluation if the symptoms worsen or if the condition fails to improve as anticipated.   Biagio Snelson, NP

## 2023-10-14 ENCOUNTER — Other Ambulatory Visit: Payer: Self-pay | Admitting: Adult Health

## 2023-10-14 DIAGNOSIS — E118 Type 2 diabetes mellitus with unspecified complications: Secondary | ICD-10-CM

## 2023-10-14 DIAGNOSIS — Z7985 Long-term (current) use of injectable non-insulin antidiabetic drugs: Secondary | ICD-10-CM

## 2023-10-20 ENCOUNTER — Ambulatory Visit: Payer: Medicare HMO | Admitting: Adult Health

## 2023-10-20 VITALS — BP 136/88 | HR 77 | Temp 98.0°F | Ht 65.0 in | Wt 186.0 lb

## 2023-10-20 DIAGNOSIS — E118 Type 2 diabetes mellitus with unspecified complications: Secondary | ICD-10-CM

## 2023-10-20 DIAGNOSIS — I10 Essential (primary) hypertension: Secondary | ICD-10-CM

## 2023-10-20 DIAGNOSIS — E66811 Obesity, class 1: Secondary | ICD-10-CM | POA: Diagnosis not present

## 2023-10-20 DIAGNOSIS — Z7985 Long-term (current) use of injectable non-insulin antidiabetic drugs: Secondary | ICD-10-CM | POA: Diagnosis not present

## 2023-10-20 LAB — POCT GLYCOSYLATED HEMOGLOBIN (HGB A1C): Hemoglobin A1C: 5.9 % — AB (ref 4.0–5.6)

## 2023-10-20 MED ORDER — MOUNJARO 7.5 MG/0.5ML ~~LOC~~ SOAJ: 7.5000 mg | SUBCUTANEOUS | 1 refills | Status: AC

## 2023-10-20 NOTE — Patient Instructions (Addendum)
 Your A1c is 5.6   Lets follow up in 6 months for your physical exam after 04/12/2024

## 2023-10-20 NOTE — Progress Notes (Signed)
 Subjective:    Patient ID: Kristin Merritt, female    DOB: 1960/10/12, 63 y.o.   MRN: [id number]  HPI 64 year old female who  has a past medical history of Allergy, Anemia, Anxiety, Asthma, Constipation, Depression, GERD (gastroesophageal reflux disease), Heart murmur, High cholesterol, Hypertension, Leg cramps, Lichen sclerosus et atrophicus, Numbness and tingling in hands, Postmenopausal HRT (hormone replacement therapy) - followed by Dr. Annitta Kindler in gyn (07/11/2012), Swelling of both hands, Uterine cancer (HCC) (1991), UTI (urinary tract infection), VIN III (vulvar intraepithelial neoplasia III), and VIN III (vulvar intraepithelial neoplasia III).  She presents to the office today for follow up regarding DM/HTN/Obesity   DM Type 2 - In November 2023 she was started on Mounjaro .  Currently on 7.5 mg. She has been tolerating this medication well with minimal nausea. She is exercising three times a week and eating healthier. She feels good at her weight and wants to maintain where she is  Lab Results  Component Value Date   HGBA1C 5.4 07/21/2023   HGBA1C 5.8 04/13/2023   HGBA1C 5.3 10/06/2022   Wt Readings from Last 3 Encounters:  10/20/23 186 lb (84.4 kg)  09/14/23 187 lb (84.8 kg)  07/21/23 192 lb (87.1 kg)   Hypertension Managed with Hyzaar 100-25 mg daily. She has been checking her BP at home with readings in the 120-130's/70-80. She denies dizziness, lightheadedness, blurred vision.  BP Readings from Last 3 Encounters:  10/20/23 136/88  09/14/23 120/80  07/21/23 120/82     Review of Systems See HPI   Past Medical History:  Diagnosis Date   Allergy    Anemia    as child   Anxiety    Asthma    Constipation    Depression    GERD (gastroesophageal reflux disease)    Heart murmur    High cholesterol    Hypertension    Leg cramps    Lichen sclerosus et atrophicus    Numbness and tingling in hands    Postmenopausal HRT (hormone replacement therapy) - followed by Dr.  Annitta Kindler in gyn 07/11/2012   Swelling of both hands    Uterine cancer (HCC) 1991   UTI (urinary tract infection)    VIN III (vulvar intraepithelial neoplasia III)    left labia majora   VIN III (vulvar intraepithelial neoplasia III)     Social History   Socioeconomic History   Marital status: Widowed    Spouse name: Not on file   Number of children: Not on file   Years of education: Not on file   Highest education level: 12th grade  Occupational History   Not on file  Tobacco Use   Smoking status: Former    Current packs/day: 0.00    Average packs/day: 0.3 packs/day for 20.0 years (5.0 ttl pk-yrs)    Types: Cigarettes    Start date: 02/21/1997    Quit date: 02/21/2017    Years since quitting: 6.6   Smokeless tobacco: Never  Vaping Use   Vaping status: Never Used  Substance and Sexual Activity   Alcohol  use: Not Currently   Drug use: No   Sexual activity: Not Currently    Partners: Male    Birth control/protection: Surgical    Comment: 1st intercourse- 17, partners- 7, hysterectomy  Other Topics Concern   Not on file  Social History Narrative   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 Enjoys walking   Diet: Tries to watch what she eats. Does not eat a lot of processed foods or fast food.    Social Drivers of Corporate investment banker Strain: Low Risk  (07/20/2023)   Overall Financial Resource Strain (CARDIA)    Difficulty of Paying Living Expenses: Not hard at all  Food Insecurity: No Food Insecurity (07/20/2023)   Hunger Vital Sign    Worried About Running Out of Food in the Last Year: Never true    Ran Out of Food in the Last Year: Never true  Transportation Needs: No Transportation Needs (07/20/2023)   PRAPARE - Administrator, Civil Service (Medical): No    Lack of Transportation (Non-Medical): No  Physical Activity: Inactive  (07/20/2023)   Exercise Vital Sign    Days of Exercise per Week: 0 days    Minutes of Exercise per Session: 0 min  Stress: No Stress Concern Present (07/20/2023)   [employer] of Occupational Health - Occupational Stress Questionnaire    Feeling of Stress : Not at all  Social Connections: Moderately Integrated (07/20/2023)   Social Connection and Isolation Panel [NHANES]    Frequency of Communication with Friends and Family: More than three times a week    Frequency of Social Gatherings with Friends and Family: More than three times a week    Attends Religious Services: More than 4 times per year    Active Member of [employer] or Organizations: Yes    Attends Banker Meetings: More than 4 times per year    Marital Status: Widowed  Recent Concern: Social Connections - Moderately Isolated (07/10/2023)   Social Connection and Isolation Panel [NHANES]    Frequency of Communication with Friends and Family: More than three times a week    Frequency of Social Gatherings with Friends and Family: Twice a week    Attends Religious Services: More than 4 times per year    Active Member of [employer] or Organizations: No    Attends Engineer, structural: Not on file    Marital Status: Separated  Intimate Partner Violence: Not At Risk (07/20/2023)   Humiliation, Afraid, Rape, and Kick questionnaire    Fear of Current or Ex-Partner: No    Emotionally Abused: No    Physically Abused: No    Sexually Abused: No    Past Surgical History:  Procedure Laterality Date   BREAST EXCISIONAL BIOPSY Left 08/24/2016   times 2   EYE SURGERY     "Lazy Muscle" x 2- one as child and another 1998   SPINE SURGERY     L5-s1   VAGINAL HYSTERECTOMY     VULVA /PERINEUM BIOPSY N/A 11/18/2015   Procedure: WIDE LOCAL EXCISION OF VULVA and MEDIAL LEFT THIGH LESION ;  Surgeon: Davia Erps, MD;  Location: [facility];  Service: Gynecology;  Laterality: N/A;   WISDOM TOOTH EXTRACTION      Family History   Problem Relation Age of Onset   Cancer Mother        LUNG    Breast cancer Mother        Died   Esophageal cancer Mother    Colon cancer Neg Hx    Colon polyps Neg Hx    Rectal cancer Neg Hx    Stomach cancer Neg Hx     No Active Allergies  Current Outpatient Medications on File  Prior to Visit  Medication Sig Dispense Refill   Albuterol Sulfate (PROAIR HFA IN) Inhale into the lungs. 1-2 puffs as needed every 4-6 hours for cough and wheezing     atorvastatin  (LIPITOR) 40 MG tablet TAKE 1 TABLET BY MOUTH EVERY DAY 90 tablet 0   cyclobenzaprine  (FLEXERIL ) 10 MG tablet Take 1 tablet (10 mg total) by mouth at bedtime. 15 tablet 0   docusate sodium (COLACE) 100 MG capsule      doxycycline  (VIBRAMYCIN ) 100 MG capsule Take 1 capsule (100 mg total) by mouth 2 (two) times daily. 14 capsule 0   EPIPEN 2-PAK 0.3 MG/0.3ML SOAJ injection      FIBER ADULT GUMMIES PO Take by mouth.     fluconazole  (DIFLUCAN ) 150 MG tablet Take one tab today and another in 3 days 2 tablet 0   glucose blood (FREESTYLE LITE) test strip Use to check blood sugar 3 times daily 300 each 3   ibuprofen  (ADVIL ) 800 MG tablet Take 1 tablet (800 mg total) by mouth every 8 (eight) hours as needed. 30 tablet 0   Lancets (FREESTYLE) lancets Use to check blood sugar 3 times daily 300 each 3   lansoprazole  (PREVACID ) 30 MG capsule Take 1 capsule (30 mg total) by mouth daily. 90 capsule 3   levocetirizine (XYZAL) 5 MG tablet Take 5 mg by mouth every evening.     losartan -hydrochlorothiazide (HYZAAR) 100-25 MG tablet Take 1 tablet by mouth daily. 90 tablet 3   methylPREDNISolone  (MEDROL  DOSEPAK) 4 MG TBPK tablet Take as directed 21 tablet 0   montelukast (SINGULAIR) 10 MG tablet Take 10 mg by mouth at bedtime.     potassium chloride  (KLOR-CON  M) 10 MEQ tablet Take 1 tablet (10 mEq total) by mouth 2 (two) times daily. Take 2 tabs together daily 180 tablet 1   tirzepatide  (MOUNJARO ) 7.5 MG/0.5ML Pen INJECT 7.5 MG SUBCUTANEOUSLY WEEKLY  2 mL 0   VITAMIN D PO Take 400 Int'l Units by mouth.     No current facility-administered medications on file prior to visit.    BP 136/88   Pulse 77   Temp 98 F (36.7 C) (Oral)   Ht 5\' 5"  (1.651 m)   Wt 186 lb (84.4 kg)   SpO2 99%   BMI 30.95 kg/m       Objective:   Physical Exam Vitals and nursing note reviewed.  Constitutional:      Appearance: Normal appearance. She is obese.  Cardiovascular:     Rate and Rhythm: Normal rate and regular rhythm.     Pulses: Normal pulses.     Heart sounds: Normal heart sounds.  Pulmonary:     Effort: Pulmonary effort is normal.     Breath sounds: Normal breath sounds.  Skin:    General: Skin is warm and dry.  Neurological:     General: No focal deficit present.     Mental Status: She is alert and oriented to person, place, and time.  Psychiatric:        Mood and Affect: Mood normal.        Behavior: Behavior normal.        Thought Content: Thought content normal.        Judgment: Judgment normal.        Assessment & Plan:  1. Controlled type 2 diabetes mellitus with complication, without long-term current use of insulin (HCC) (Primary)  - POC HgB A1c- 5.6  - tirzepatide  (MOUNJARO ) 7.5 MG/0.5ML Pen; Inject 7.5 mg into  the skin once a week.  Dispense: 6 mL; Refill: 1 - Follow up in 6 months for CPE   2. Long-term current use of injectable noninsulin antidiabetic medication  - tirzepatide  (MOUNJARO ) 7.5 MG/0.5ML Pen; Inject 7.5 mg into the skin once a week.  Dispense: 6 mL; Refill: 1  3. Essential hypertension, benign - Well controlled. No change   4. Class 1 obesity - Weight stable and she is happy  with this weight.   Vivi Piccirilli, NP

## 2023-10-31 LAB — HM DIABETES EYE EXAM

## 2023-11-03 ENCOUNTER — Encounter: Payer: Self-pay | Admitting: Adult Health

## 2023-11-30 ENCOUNTER — Encounter: Admitting: Obstetrics and Gynecology

## 2023-11-30 ENCOUNTER — Ambulatory Visit: Admitting: Radiology

## 2023-11-30 ENCOUNTER — Encounter: Payer: Self-pay | Admitting: Radiology

## 2023-11-30 VITALS — BP 130/70 | HR 85 | Wt 187.8 lb

## 2023-11-30 DIAGNOSIS — N760 Acute vaginitis: Secondary | ICD-10-CM | POA: Diagnosis not present

## 2023-11-30 DIAGNOSIS — B9689 Other specified bacterial agents as the cause of diseases classified elsewhere: Secondary | ICD-10-CM

## 2023-11-30 DIAGNOSIS — N3000 Acute cystitis without hematuria: Secondary | ICD-10-CM

## 2023-11-30 DIAGNOSIS — Z113 Encounter for screening for infections with a predominantly sexual mode of transmission: Secondary | ICD-10-CM

## 2023-11-30 DIAGNOSIS — J0100 Acute maxillary sinusitis, unspecified: Secondary | ICD-10-CM

## 2023-11-30 DIAGNOSIS — A599 Trichomoniasis, unspecified: Secondary | ICD-10-CM

## 2023-11-30 DIAGNOSIS — N9489 Other specified conditions associated with female genital organs and menstrual cycle: Secondary | ICD-10-CM

## 2023-11-30 LAB — WET PREP FOR TRICH, YEAST, CLUE

## 2023-11-30 MED ORDER — METRONIDAZOLE 500 MG PO TABS: 500.0000 mg | ORAL_TABLET | Freq: Two times a day (BID) | ORAL | 0 refills | Status: DC

## 2023-11-30 MED ORDER — FLUCONAZOLE 150 MG PO TABS: ORAL_TABLET | ORAL | 0 refills | Status: DC

## 2023-11-30 MED ORDER — NITROFURANTOIN MONOHYD MACRO 100 MG PO CAPS: 100.0000 mg | ORAL_CAPSULE | Freq: Two times a day (BID) | ORAL | 0 refills | Status: DC

## 2023-11-30 NOTE — Progress Notes (Signed)
      Subjective: Kristin Merritt is a 63 y.o. female who complains of vaginal itching and burning with urination x 3 days. Has some discharge for the first day. Changed soap recently. No other symptoms. 1 female partner x 1 year, no condom use.    Review of Systems  All other systems reviewed and are negative.   Past Medical History:  Diagnosis Date   Allergy    Anemia    as child   Anxiety    Asthma    Constipation    Depression    GERD (gastroesophageal reflux disease)    Heart murmur    High cholesterol    Hypertension    Leg cramps    Lichen sclerosus et atrophicus    Numbness and tingling in hands    Postmenopausal HRT (hormone replacement therapy) - followed by Dr. Winfred in gyn 07/11/2012   Swelling of both hands    Uterine cancer (HCC) 1991   UTI (urinary tract infection)    VIN III (vulvar intraepithelial neoplasia III)    left labia majora   VIN III (vulvar intraepithelial neoplasia III)       Objective:  Today's Vitals   11/30/23 1459  BP: 130/70  Pulse: 85  SpO2: 97%  Weight: 187 lb 12.8 oz (85.2 kg)   Body mass index is 31.25 kg/m.   Physical Exam Vitals and nursing note reviewed. Exam conducted with a chaperone present.  Constitutional:      Appearance: Normal appearance. She is well-developed.  Pulmonary:     Effort: Pulmonary effort is normal.  Abdominal:     General: Abdomen is flat.     Palpations: Abdomen is soft.  Genitourinary:    General: Normal vulva.     Vagina: Vaginal discharge present. No erythema, bleeding or lesions.  Neurological:     Mental Status: She is alert.  Psychiatric:        Mood and Affect: Mood normal.        Thought Content: Thought content normal.        Judgment: Judgment normal.    Urine dipstick shows positive for nitrates, positive for leukocytes, and positive for ketones.  Micro exam: 6-10 WBC's per HPF and many+ bacteria. Trichomonas seen in urine. Microscopic wet-mount exam shows clue cells,  trichomonads.   Darice Hoit, CMA present for exam  Assessment:/Plan:  1. Vaginal burning (Primary) - Urinalysis,Complete w/RFL Culture - WET PREP FOR TRICH, YEAST, CLUE  2. Screening for STDs (sexually transmitted diseases) - SURESWAB CT/NG/T. vaginalis  3. Trichomoniasis - metroNIDAZOLE  (FLAGYL ) 500 MG tablet; Take 1 tablet (500 mg total) by mouth 2 (two) times daily.  Dispense: 14 tablet; Refill: 0  4. Bacterial vaginosis - metroNIDAZOLE  (FLAGYL ) 500 MG tablet; Take 1 tablet (500 mg total) by mouth 2 (two) times daily.  Dispense: 14 tablet; Refill: 0  5. Acute cystitis without hematuria - nitrofurantoin , macrocrystal-monohydrate, (MACROBID ) 100 MG capsule; Take 1 capsule (100 mg total) by mouth 2 (two) times daily.  Dispense: 14 capsule; Refill: 0   Will contact patient with results of testing completed today. Avoid intercourse until symptoms are resolved and meds are finished. Partner needs treatment.. Safe sex encouraged. Avoid the use of soaps or perfumed products in the peri area. Avoid tub baths and sitting in sweaty or wet clothing for prolonged periods of time.   AEX scheduled 12/13/23 TOC 6 weeks  Jaquita Bessire B, NP 3:24 PM

## 2023-11-30 NOTE — Addendum Note (Signed)
 Addended by: Andelyn Spade on: 11/30/2023 03:58 PM   Modules accepted: Orders

## 2023-12-01 LAB — URINALYSIS, COMPLETE W/RFL CULTURE
Bilirubin Urine: NEGATIVE
Glucose, UA: NEGATIVE
Hgb urine dipstick: NEGATIVE
Hyaline Cast: NONE SEEN /LPF
Nitrites, Initial: POSITIVE — AB
RBC / HPF: NONE SEEN /HPF (ref 0–2)
Specific Gravity, Urine: 1.015 (ref 1.001–1.035)
pH: 7.5 (ref 5.0–8.0)

## 2023-12-01 LAB — URINE CULTURE
MICRO NUMBER:: 16676602
Result:: NO GROWTH
SPECIMEN QUALITY:: ADEQUATE

## 2023-12-01 LAB — SURESWAB CT/NG/T. VAGINALIS
C. trachomatis RNA, TMA: NOT DETECTED
N. gonorrhoeae RNA, TMA: NOT DETECTED
Trichomonas vaginalis RNA: DETECTED — AB

## 2023-12-01 LAB — CULTURE INDICATED

## 2023-12-02 ENCOUNTER — Ambulatory Visit: Payer: Self-pay | Admitting: Radiology

## 2023-12-07 ENCOUNTER — Ambulatory Visit (INDEPENDENT_AMBULATORY_CARE_PROVIDER_SITE_OTHER): Admitting: *Deleted

## 2023-12-07 ENCOUNTER — Telehealth: Payer: Self-pay

## 2023-12-07 DIAGNOSIS — Z111 Encounter for screening for respiratory tuberculosis: Secondary | ICD-10-CM | POA: Diagnosis not present

## 2023-12-07 NOTE — Telephone Encounter (Signed)
 Spoke to pt and she advised that she has an appt. Today. Pt advised that she can get the blood test for TB if appropriate by employer. Pt will call to see if she an have the blood work done.

## 2023-12-07 NOTE — Telephone Encounter (Signed)
 Noted

## 2023-12-07 NOTE — Telephone Encounter (Signed)
 Copied from CRM [id number]. Topic: General - Other >> Dec 07, 2023  1:21 PM Drema MATSU wrote: Reason for CRM: Patient wants to let Marjorie know that her employer said Skin test.

## 2023-12-07 NOTE — Telephone Encounter (Signed)
 Copied from CRM [id number]. Topic: Appointments - Appointment Scheduling >> Dec 06, 2023  3:53 PM Franky GRADE wrote: Patient/patient representative is calling to schedule an appointment. Refer to attachments for appointment information.  Patient is calling to schedule a TB test for her employer.

## 2023-12-09 ENCOUNTER — Ambulatory Visit

## 2023-12-09 LAB — TB SKIN TEST
Induration: 0 mm
TB Skin Test: NEGATIVE

## 2023-12-09 NOTE — Progress Notes (Signed)
 PPD Reading Note  PPD read and results entered in EpicCare.  Result: 0 mm induration.  Interpretation: Negative  If test not read within 48-72 hours of initial placement, patient advised to repeat in other arm 1-3 weeks after this test.  Allergic reaction: no

## 2023-12-12 NOTE — Progress Notes (Signed)
 63 y.o. 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 Widowed.  No LMP recorded. Patient has had a hysterectomy.   She reports no concerns today. Recent dx of trich and UTI (11/30/2023).  She has completed antibiotics and feels better.  Urine sample provided: No  Abnormal bleeding: none Pelvic discharge or pain: none Breast mass, nipple discharge or skin changes : none  Sexually active: Yes Birth control: None Last PAP:     Component Value Date/Time   DIAGPAP  06/16/2021 1632    - Negative for intraepithelial lesion or malignancy (NILM)   ADEQPAP  06/16/2021 1632    Satisfactory for evaluation; transformation zone component ABSENT.   Last mammogram: 03/25/23 Bi-Rads 1, b  Last colonoscopy: 08/13/16 10 year recall  Exercising: Yes, cardio 2 days a week 20 mins each Smoker: No  Flowsheet Row Office Visit from 12/13/2023 in [facility]  PHQ-2 Total Score 0    Flowsheet Row Office Visit from 02/24/2022 in [facility]  PHQ-9 Total Score 0     GYN HISTORY: As noted  OB History  Gravida Para Term Preterm AB Living  1 1 1   1   SAB IAB Ectopic Multiple Live Births      1    # Outcome Date GA Lbr Len/2nd Weight Sex Type Anes PTL Lv  1 Term      Vag-Spont   LIV   Past Medical History:  Diagnosis Date   Allergy    Anemia    as child   Anxiety    Asthma    Constipation    Depression    GERD (gastroesophageal reflux disease)    Heart murmur    High cholesterol    Hypertension    Leg cramps    Lichen sclerosus et atrophicus    Numbness and tingling in hands    Postmenopausal HRT (hormone replacement therapy) - followed by Dr. Winfred in gyn 07/11/2012   Swelling of both hands    Uterine cancer (HCC) 1991   UTI (urinary tract infection)    VIN III (vulvar intraepithelial neoplasia III)    left labia majora   VIN  III (vulvar intraepithelial neoplasia III)    Past Surgical History:  Procedure Laterality Date   BREAST EXCISIONAL BIOPSY Left 08/24/2016   times 2   EYE SURGERY     Lazy Muscle x 2- one as child and another 1998   SPINE SURGERY     L5-s1   VAGINAL HYSTERECTOMY     VULVA /PERINEUM BIOPSY N/A 11/18/2015   Procedure: WIDE LOCAL EXCISION OF VULVA and MEDIAL LEFT THIGH LESION ;  Surgeon: Curlee VEAR Winfred, MD;  Location: [facility];  Service: Gynecology;  Laterality: N/A;   WISDOM TOOTH EXTRACTION     Current Outpatient Medications on File Prior to Visit  Medication Sig Dispense Refill   Albuterol Sulfate (PROAIR HFA IN) Inhale into the lungs. 1-2 puffs as needed every 4-6 hours for cough and wheezing     atorvastatin  (LIPITOR) 40 MG tablet TAKE 1 TABLET BY MOUTH EVERY DAY 90 tablet 0   cyclobenzaprine  (FLEXERIL ) 10 MG tablet Take 1 tablet (10 mg total) by mouth at bedtime. 15 tablet 0   docusate sodium (COLACE) 100 MG capsule      EPIPEN 2-PAK 0.3 MG/0.3ML SOAJ injection      FIBER ADULT GUMMIES PO Take  by mouth.     glucose blood (FREESTYLE LITE) test strip Use to check blood sugar 3 times daily 300 each 3   ibuprofen  (ADVIL ) 800 MG tablet Take 1 tablet (800 mg total) by mouth every 8 (eight) hours as needed. 30 tablet 0   Lancets (FREESTYLE) lancets Use to check blood sugar 3 times daily 300 each 3   lansoprazole  (PREVACID ) 30 MG capsule Take 1 capsule (30 mg total) by mouth daily. 90 capsule 3   levocetirizine (XYZAL) 5 MG tablet Take 5 mg by mouth every evening.     losartan -hydrochlorothiazide (HYZAAR) 100-25 MG tablet Take 1 tablet by mouth daily. 90 tablet 3   montelukast (SINGULAIR) 10 MG tablet Take 10 mg by mouth at bedtime.     potassium chloride  (KLOR-CON  M) 10 MEQ tablet Take 1 tablet (10 mEq total) by mouth 2 (two) times daily. Take 2 tabs together daily 180 tablet 1   tirzepatide  (MOUNJARO ) 7.5 MG/0.5ML Pen Inject 7.5 mg into the skin once a week. 6 mL 1   VITAMIN D PO Take  400 Int'l Units by mouth.     No current facility-administered medications on file prior to visit.   Social History   Socioeconomic History   Marital status: Widowed    Spouse name: Not on file   Number of children: Not on file   Years of education: Not on file   Highest education level: 12th grade  Occupational History   Not on file  Tobacco Use   Smoking status: Former    Current packs/day: 0.00    Average packs/day: 0.3 packs/day for 20.0 years (5.0 ttl pk-yrs)    Types: Cigarettes    Start date: 02/21/1997    Quit date: 02/21/2017    Years since quitting: 6.8   Smokeless tobacco: Never  Vaping Use   Vaping status: Never Used  Substance and Sexual Activity   Alcohol  use: Yes    Comment: occ   Drug use: No   Sexual activity: Yes    Partners: Male    Birth control/protection: Surgical, Condom    Comment: 1st intercourse- 17, partners- 7, hysterectomy  Other Topics Concern   Not on file  Social History Narrative   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 Enjoys walking   Diet: Tries to watch what she eats. Does not eat a lot of processed foods or fast food.    Social Drivers of Corporate investment banker Strain: Low Risk  (07/20/2023)   Overall Financial Resource Strain (CARDIA)    Difficulty of Paying Living Expenses: Not hard at all  Food Insecurity: No Food Insecurity (07/20/2023)   Hunger Vital Sign    Worried About Running Out of Food in the Last Year: Never true    Ran Out of Food in the Last Year: Never true  Transportation Needs: No Transportation Needs (07/20/2023)   PRAPARE - Administrator, Civil Service (Medical): No    Lack of Transportation (Non-Medical): No  Physical Activity: Inactive (07/20/2023)   Exercise Vital Sign    Days of Exercise per Week: 0 days    Minutes of Exercise per Session: 0 min  Stress: No Stress Concern  Present (07/20/2023)   [employer] of Occupational Health - Occupational Stress Questionnaire    Feeling  of Stress : Not at all  Social Connections: Moderately Integrated (07/20/2023)   Social Connection and Isolation Panel    Frequency of Communication with Friends and Family: More than three times a week    Frequency of Social Gatherings with Friends and Family: More than three times a week    Attends Religious Services: More than 4 times per year    Active Member of [employer] or Organizations: Yes    Attends Banker Meetings: More than 4 times per year    Marital Status: Widowed  Recent Concern: Social Connections - Moderately Isolated (07/10/2023)   Social Connection and Isolation Panel    Frequency of Communication with Friends and Family: More than three times a week    Frequency of Social Gatherings with Friends and Family: Twice a week    Attends Religious Services: More than 4 times per year    Active Member of [employer] or Organizations: No    Attends Engineer, structural: Not on file    Marital Status: Separated  Intimate Partner Violence: Not At Risk (07/20/2023)   Humiliation, Afraid, Rape, and Kick questionnaire    Fear of Current or Ex-Partner: No    Emotionally Abused: No    Physically Abused: No    Sexually Abused: No   Family History  Problem Relation Age of Onset   Cancer Mother        LUNG    Breast cancer Mother        Died   Esophageal cancer Mother    Colon cancer Neg Hx    Colon polyps Neg Hx    Rectal cancer Neg Hx    Stomach cancer Neg Hx    No Active Allergies   PE Today's Vitals   12/13/23 0812  BP: 112/72  Pulse: 72  Temp: 98.7 F (37.1 C)  TempSrc: Oral  SpO2: 98%   There is no height or weight on file to calculate BMI.  Physical Exam Vitals reviewed. Exam conducted with a chaperone present.  Constitutional:      General: She is not in acute distress.    Appearance: Normal appearance.  HENT:     Head:  Normocephalic and atraumatic.     Nose: Nose normal.  Eyes:     Extraocular Movements: Extraocular movements intact.     Conjunctiva/sclera: Conjunctivae normal.  Neck:     Thyroid : No thyroid  mass, thyromegaly or thyroid  tenderness.  Pulmonary:     Effort: Pulmonary effort is normal.  Chest:     Chest wall: No mass or tenderness.  Breasts:    Right: Normal. No swelling, mass, nipple discharge or tenderness.     Left: Normal. No swelling, mass, nipple discharge or tenderness.  Abdominal:     General: There is no distension.     Palpations: Abdomen is soft.     Tenderness: There is no abdominal tenderness.  Genitourinary:    General: Normal vulva.     Exam position: Lithotomy position.     Urethra: No prolapse.     Vagina: Normal. No vaginal discharge or bleeding.     Cervix: No lesion.     Adnexa: Right adnexa normal and left adnexa normal.     Comments: Cervix and uterus absent Musculoskeletal:        General: Normal range of motion.     Cervical back: Normal range of motion.  Lymphadenopathy:     Upper Body:     Right upper body: No axillary adenopathy.  Left upper body: No axillary adenopathy.     Lower Body: No right inguinal adenopathy. No left inguinal adenopathy.  Skin:    General: Skin is warm and dry.  Neurological:     General: No focal deficit present.     Mental Status: She is alert.  Psychiatric:        Mood and Affect: Mood normal.        Behavior: Behavior normal.       Assessment and Plan:        Encounter for breast and pelvic examination Assessment & Plan: PAP due 2026 Encouraged annual mammogram screening Colonoscopy UTD DXA due at 63yo Labs and immunizations with her primary Encouraged safe sexual practices as indicated Encouraged healthy lifestyle practices with diet and exercise For patients under 50-70yo, I recommend 1200mg  calcium  daily and 600IU of vitamin D daily.    Negative depression screening  History of Vulva cancer  [facility]) Assessment & Plan: Normal vulvar exam, benign vulvar colpo with Dr. Eloy in 2018    Kristin LULLA Pa, MD

## 2023-12-13 ENCOUNTER — Ambulatory Visit: Payer: Self-pay | Admitting: Adult Health

## 2023-12-13 ENCOUNTER — Ambulatory Visit (INDEPENDENT_AMBULATORY_CARE_PROVIDER_SITE_OTHER): Admitting: Obstetrics and Gynecology

## 2023-12-13 ENCOUNTER — Encounter: Payer: Self-pay | Admitting: Obstetrics and Gynecology

## 2023-12-13 VITALS — BP 112/72 | HR 72 | Temp 98.7°F

## 2023-12-13 DIAGNOSIS — Z1331 Encounter for screening for depression: Secondary | ICD-10-CM

## 2023-12-13 DIAGNOSIS — Z9189 Other specified personal risk factors, not elsewhere classified: Secondary | ICD-10-CM | POA: Diagnosis not present

## 2023-12-13 DIAGNOSIS — N898 Other specified noninflammatory disorders of vagina: Secondary | ICD-10-CM

## 2023-12-13 DIAGNOSIS — Z01419 Encounter for gynecological examination (general) (routine) without abnormal findings: Secondary | ICD-10-CM | POA: Insufficient documentation

## 2023-12-13 DIAGNOSIS — C519 Malignant neoplasm of vulva, unspecified: Secondary | ICD-10-CM | POA: Diagnosis not present

## 2023-12-13 DIAGNOSIS — Z7251 High risk heterosexual behavior: Secondary | ICD-10-CM

## 2023-12-13 DIAGNOSIS — A5901 Trichomonal vulvovaginitis: Secondary | ICD-10-CM

## 2023-12-13 NOTE — Assessment & Plan Note (Signed)
 PAP due 2026 Encouraged annual mammogram screening Colonoscopy UTD DXA due at 63yo Labs and immunizations with her primary Encouraged safe sexual practices as indicated Encouraged healthy lifestyle practices with diet and exercise For patients under 50-70yo, I recommend 1200mg  calcium  daily and 600IU of vitamin D daily.

## 2023-12-13 NOTE — Assessment & Plan Note (Signed)
 Normal vulvar exam, benign vulvar colpo with Dr. Eloy in 2018

## 2023-12-13 NOTE — Patient Instructions (Signed)

## 2024-01-09 ENCOUNTER — Other Ambulatory Visit: Payer: Self-pay | Admitting: Adult Health

## 2024-01-11 ENCOUNTER — Ambulatory Visit (INDEPENDENT_AMBULATORY_CARE_PROVIDER_SITE_OTHER): Admitting: Radiology

## 2024-01-11 ENCOUNTER — Encounter: Payer: Self-pay | Admitting: Radiology

## 2024-01-11 VITALS — BP 142/78 | HR 57 | Wt 189.0 lb

## 2024-01-11 DIAGNOSIS — A599 Trichomoniasis, unspecified: Secondary | ICD-10-CM | POA: Diagnosis not present

## 2024-01-11 LAB — WET PREP FOR TRICH, YEAST, CLUE

## 2024-01-11 NOTE — Progress Notes (Signed)
      Subjective: Kristin Merritt is a 63 y.o. female here for [facility] trich, took all meds, partner was treated, symptoms resolved.   Review of Systems  All other systems reviewed and are negative.   Past Medical History:  Diagnosis Date   Allergy    Anemia    as child   Anxiety    Asthma    Constipation    Depression    GERD (gastroesophageal reflux disease)    Heart murmur    High cholesterol    Hypertension    Leg cramps    Lichen sclerosus et atrophicus    Numbness and tingling in hands    Postmenopausal HRT (hormone replacement therapy) - followed by Dr. 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   Physical Exam Vitals and nursing note reviewed. Exam conducted with a chaperone present.  Constitutional:      Appearance: Normal appearance. She is well-developed.  Pulmonary:     Effort: Pulmonary effort is normal.  Abdominal:     General: Abdomen is flat.     Palpations: Abdomen is soft.  Genitourinary:    General: Normal vulva.     Vagina: Vaginal discharge present. No erythema, bleeding or lesions.     Cervix: Normal. No discharge, friability, lesion or erythema.     Uterus: Normal.      Adnexa: Right adnexa normal and left adnexa normal.  Neurological:     Mental Status: She is alert.  Psychiatric:        Mood and Affect: Mood normal.        Thought Content: Thought content normal.        Judgment: Judgment normal.      Microscopic wet-mount exam shows negative for pathogens, normal epithelial cells.   Darice Hoit, CMA present for exam  Assessment:/Plan:   1. Trichomoniasis  (Primary) Reassured negative Safe sex encouraged - WET PREP FOR TRICH, YEAST, CLUE    Laurieann Friddle B, NP 9:46 AM

## 2024-01-16 ENCOUNTER — Ambulatory Visit: Payer: Medicare HMO | Admitting: Adult Health

## 2024-02-06 ENCOUNTER — Encounter: Payer: Self-pay | Admitting: Adult Health

## 2024-02-06 ENCOUNTER — Ambulatory Visit: Admitting: Adult Health

## 2024-02-06 VITALS — BP 120/75 | HR 64 | Temp 98.2°F | Ht 66.0 in | Wt 188.2 lb

## 2024-02-06 DIAGNOSIS — Z87891 Personal history of nicotine dependence: Secondary | ICD-10-CM | POA: Diagnosis not present

## 2024-02-06 DIAGNOSIS — G4733 Obstructive sleep apnea (adult) (pediatric): Secondary | ICD-10-CM

## 2024-02-06 DIAGNOSIS — Z6830 Body mass index (BMI) 30.0-30.9, adult: Secondary | ICD-10-CM

## 2024-02-06 NOTE — Progress Notes (Signed)
 @Patient  ID: Kristin Merritt, female    DOB: Feb 18, 1961, 63 y.o.   MRN: [id number]  Chief Complaint  Patient presents with   Obstructive Sleep Apnea    Referring provider: Merna Huxley, NP  HPI: 63 yo female seen for sleep consult December 2023 found to have Moderate OSA  TEST/EVENTS :  home sleep study that was completed on August 24, 2022. This showed moderate sleep apnea with a AHI at 18.4/hour and SpO2 low at 80%.   02/06/2024 Follow up: OSA Discussed the use of AI scribe software for clinical note transcription with the patient, who gave verbal consent to proceed.  History of Present Illness Kristin Merritt is a 63 year old female who presents for a six-month CPAP checkup.  She is generally doing well with her CPAP machine, although she sometimes falls asleep while watching TV and forgets to put the mask on until she wakes up for a bathroom run. On those nights, she estimates using the CPAP for about four hours.  She experiences some air leakage from the mask, which she attributes to the mask being too large. She believes she might need a smaller size as the current one, a medium, allows air to escape, especially when the hose pulls slightly.  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 Her sister, who was 40 years old, was undergoing dialysis and had a history of alcoholism. She also mentions the sudden death of her neighbor, which has been emotionally challenging for her.  She is actively trying to maintain her CPAP equipment, using white cloths and Dawn soap to clean the water chamber and hose, and ensuring they dry properly before reuse.  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     No Known Allergies  Immunization History  Administered Date(s) Administered   INFLUENZA, HIGH DOSE  SEASONAL PF 05/04/2013, 07/27/2017   Influenza,inj,Quad PF,6+ Mos 01/29/2013, 02/13/2014, 02/21/2015, 03/19/2016, 03/09/2017, 01/23/2021, 04/02/2022, 03/18/2023   Influenza-Unspecified 02/21/2018, 03/07/2020   Moderna Covid-19 Vaccine Bivalent Booster 78yrs & up 05/08/2021   Moderna Sars-Covid-2 Vaccination 08/20/2019, 09/12/2019, 03/03/2020   PNEUMOCOCCAL CONJUGATE-20 01/15/2021   PPD Test 06/03/2014, 08/19/2015, 10/06/2016, 12/07/2023   Respiratory Syncytial Virus Vaccine,Recomb Aduvanted(Arexvy) 05/10/2022   Tdap 01/09/2013    Past Medical History:  Diagnosis Date   Allergy    Anemia    as child   Anxiety    Asthma    Constipation    Depression    GERD (gastroesophageal reflux disease)    Heart murmur    High cholesterol    Hypertension    Leg cramps    Lichen sclerosus et atrophicus    Numbness and tingling in hands    Postmenopausal HRT (hormone replacement therapy) - followed by Dr. 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 1-2 puffs as needed every 4-6 hours for cough and wheezing     atorvastatin  (LIPITOR) 40 MG tablet TAKE 1 TABLET BY MOUTH EVERY DAY 90 tablet 0   Azelastine-Fluticasone  137-50 MCG/ACT SUSP Place 1 spray into both nostrils 2 (two) times daily.     cyclobenzaprine  (FLEXERIL ) 10 MG tablet Take 1 tablet (10 mg  total) by mouth at bedtime. 15 tablet 0   docusate sodium (COLACE) 100 MG capsule      EPIPEN 2-PAK 0.3 MG/0.3ML SOAJ injection      FIBER ADULT GUMMIES PO Take by mouth.     glucose blood (FREESTYLE LITE) test strip Use to check blood sugar 3 times daily 300 each 3   ibuprofen  (ADVIL ) 800 MG tablet Take 1 tablet (800 mg total) by mouth every 8 (eight) hours as needed. 30 tablet 0   Lancets (FREESTYLE) lancets Use to check blood sugar 3 times daily 300 each 3   lansoprazole  (PREVACID ) 30 MG capsule Take 1 capsule (30 mg total) by mouth daily. 90 capsule 3   levocetirizine (XYZAL) 5 MG tablet Take 5 mg by mouth every evening.     losartan -hydrochlorothiazide (HYZAAR) 100-25 MG tablet Take 1 tablet by mouth daily. 90 tablet 3   montelukast (SINGULAIR) 10 MG tablet Take 10 mg by mouth at bedtime.     MOUNJARO  7.5 MG/0.5ML Pen Inject 7.5 mg into the skin once a week.     potassium chloride  (KLOR-CON  M) 10 MEQ tablet Take 1 tablet (10 mEq total) by mouth 2 (two) times daily. Take 2 tabs together daily 180 tablet 1   VITAMIN D PO Take 400 Int'l Units by mouth.     No facility-administered medications prior to visit.     Review of Systems:   Constitutional:   No  weight loss, night sweats,  Fevers, chills, fatigue, or  lassitude.  HEENT:   No headaches,  Difficulty swallowing,  Tooth/dental problems, or  Sore throat,                No sneezing, itching, ear ache, nasal congestion, post nasal drip,   CV:  No chest pain,  Orthopnea, PND, swelling in lower extremities, anasarca, dizziness, palpitations, syncope.   GI  No heartburn, indigestion, abdominal pain, nausea, vomiting, diarrhea, change in bowel habits, loss of appetite, bloody stools.   Resp: No shortness of breath with exertion or at rest.  No excess mucus, no productive cough,  No non-productive cough,  No coughing up of blood.  No change in color of mucus.  No wheezing.  No chest wall deformity  Skin: no rash or lesions.  GU: no  dysuria, change in color of urine, no urgency or frequency.  No flank pain, no hematuria   MS:  No joint pain or swelling.  No decreased range of motion.  No back pain.    Physical Exam  BP 120/75   Pulse 64   Temp 98.2 F (36.8 C)   Ht 5' 6 (1.676 m)   Wt 188 lb 3.2 oz (85.4 kg)   SpO2 98%   BMI 30.38 kg/m   GEN: A/Ox3; pleasant , NAD, well nourished    HEENT:  [place]/AT,   NOSE-clear, THROAT-clear, no lesions, no postnasal drip or exudate noted.   NECK:  Supple w/ fair ROM; no  JVD; normal carotid impulses w/o bruits; no thyromegaly or nodules palpated; no lymphadenopathy.    RESP  Clear  P & A; w/o, wheezes/ rales/ or rhonchi. no accessory muscle use, no dullness to percussion  CARD:  RRR, no m/r/g, no peripheral edema, pulses intact, no cyanosis or clubbing.  GI:   Soft & nt; nml bowel sounds; no organomegaly or masses detected.   Musco: Warm bil, no deformities or joint swelling noted.   Neuro: alert, no focal deficits noted.    Skin: Warm, no lesions or rashes      BNP No results found for: BNP  ProBNP No results found for: PROBNP  Imaging: No results found.  Administration History     None           No data to display          No results found for: NITRICOXIDE      Assessment & Plan:   No problem-specific Assessment & Plan notes found for this encounter. Assessment and Plan Assessment & Plan Obstructive sleep apnea   Obstructive sleep apnea is well-managed with CPAP therapy.  She has good usage and excellent control.  . Encourage consistent CPAP use as part of her bedtime routine. Ensure proper CPAP mask fit and consider ordering a smaller size if necessary. Provide contact information for the CPAP supplier, [employer], for mask reordering. Advise on proper cleaning of CPAP equipment, including using distilled water and regular cleaning of the mask and hose.  BMI 30 -healthy weight loss.   Plan Patient Instructions  Continue on CPAP  At bedtime, wear all night long.  Work on healthy sleep regimen  Healthy weight loss  Do not drive if sleepy  Saline nasal spray 2 puffs Twice daily   Saline gel (AYR) At bedtime   [employer] DME-order for small nasal mask. ( Follow up in 1 year and As needed          Madelin Stank, NP 02/06/2024

## 2024-02-06 NOTE — Patient Instructions (Addendum)
 Continue on CPAP At bedtime, wear all night long.  Work on healthy sleep regimen  Healthy weight loss  Do not drive if sleepy  Saline nasal spray 2 puffs Twice daily   Saline gel (AYR) At bedtime   [employer] D[employer]-order for small nasal mask. ( Follow up in 1 year and As needed

## 2024-02-06 NOTE — Addendum Note (Signed)
 Addended byBETHA FRIES, Alaney Witter A on: 02/06/2024 11:09 AM   Modules accepted: Orders

## 2024-04-08 ENCOUNTER — Other Ambulatory Visit: Payer: Self-pay | Admitting: Adult Health

## 2024-04-09 LAB — COLOGUARD
COLOGUARD: NEGATIVE
Cologuard: NEGATIVE

## 2024-04-10 ENCOUNTER — Encounter: Payer: Self-pay | Admitting: Adult Health

## 2024-04-17 ENCOUNTER — Other Ambulatory Visit: Payer: Self-pay | Admitting: Adult Health

## 2024-04-17 ENCOUNTER — Encounter: Payer: Self-pay | Admitting: Adult Health

## 2024-04-17 ENCOUNTER — Ambulatory Visit: Admitting: Adult Health

## 2024-04-17 ENCOUNTER — Ambulatory Visit: Payer: Self-pay | Admitting: Adult Health

## 2024-04-17 VITALS — BP 126/78 | HR 67 | Temp 98.8°F | Ht 65.5 in | Wt 186.8 lb

## 2024-04-17 DIAGNOSIS — E785 Hyperlipidemia, unspecified: Secondary | ICD-10-CM

## 2024-04-17 DIAGNOSIS — E118 Type 2 diabetes mellitus with unspecified complications: Secondary | ICD-10-CM

## 2024-04-17 DIAGNOSIS — Z7985 Long-term (current) use of injectable non-insulin antidiabetic drugs: Secondary | ICD-10-CM

## 2024-04-17 DIAGNOSIS — I10 Essential (primary) hypertension: Secondary | ICD-10-CM | POA: Diagnosis not present

## 2024-04-17 DIAGNOSIS — E66811 Obesity, class 1: Secondary | ICD-10-CM

## 2024-04-17 DIAGNOSIS — Z0000 Encounter for general adult medical examination without abnormal findings: Secondary | ICD-10-CM | POA: Diagnosis not present

## 2024-04-17 DIAGNOSIS — M5441 Lumbago with sciatica, right side: Secondary | ICD-10-CM

## 2024-04-17 DIAGNOSIS — G4730 Sleep apnea, unspecified: Secondary | ICD-10-CM

## 2024-04-17 LAB — CBC WITH DIFFERENTIAL/PLATELET
Basophils Absolute: 0 K/uL (ref 0.0–0.1)
Basophils Relative: 0.6 % (ref 0.0–3.0)
Eosinophils Absolute: 0.1 K/uL (ref 0.0–0.7)
Eosinophils Relative: 1.6 % (ref 0.0–5.0)
HCT: 37.6 % (ref 36.0–46.0)
Hemoglobin: 12.4 g/dL (ref 12.0–15.0)
Lymphocytes Relative: 33.9 % (ref 12.0–46.0)
Lymphs Abs: 2.3 K/uL (ref 0.7–4.0)
MCHC: 32.8 g/dL (ref 30.0–36.0)
MCV: 79.4 fl (ref 78.0–100.0)
Monocytes Absolute: 0.4 K/uL (ref 0.1–1.0)
Monocytes Relative: 6.5 % (ref 3.0–12.0)
Neutro Abs: 3.9 K/uL (ref 1.4–7.7)
Neutrophils Relative %: 57.4 % (ref 43.0–77.0)
Platelets: 146 K/uL — ABNORMAL LOW (ref 150.0–400.0)
RBC: 4.74 Mil/uL (ref 3.87–5.11)
RDW: 14.6 % (ref 11.5–15.5)
WBC: 6.7 K/uL (ref 4.0–10.5)

## 2024-04-17 LAB — LIPID PANEL
Cholesterol: 137 mg/dL (ref 0–200)
HDL: 71.8 mg/dL (ref >39.00)
LDL Cholesterol: 54 mg/dL (ref 0–99)
NonHDL: 65.52
Total CHOL/HDL Ratio: 2
Triglycerides: 56 mg/dL (ref 0.0–149.0)
VLDL: 11.2 mg/dL (ref 0.0–40.0)

## 2024-04-17 LAB — COMPREHENSIVE METABOLIC PANEL WITH GFR
ALT: 17 U/L (ref 0–35)
AST: 17 U/L (ref 0–37)
Albumin: 4.3 g/dL (ref 3.5–5.2)
Alkaline Phosphatase: 64 U/L (ref 39–117)
BUN: 14 mg/dL (ref 6–23)
CO2: 32 meq/L (ref 19–32)
Calcium: 9.3 mg/dL (ref 8.4–10.5)
Chloride: 103 meq/L (ref 96–112)
Creatinine, Ser: 0.92 mg/dL (ref 0.40–1.20)
GFR: 66.39 mL/min (ref >60.00)
Glucose, Bld: 98 mg/dL (ref 70–99)
Potassium: 3.7 meq/L (ref 3.5–5.1)
Sodium: 142 meq/L (ref 135–145)
Total Bilirubin: 0.6 mg/dL (ref 0.2–1.2)
Total Protein: 6.7 g/dL (ref 6.0–8.3)

## 2024-04-17 LAB — HEMOGLOBIN A1C: Hgb A1c MFr Bld: 5.7 % (ref 4.6–6.5)

## 2024-04-17 LAB — MICROALBUMIN / CREATININE URINE RATIO
Creatinine,U: 173.9 mg/dL
Microalb Creat Ratio: 6.2 mg/g (ref 0.0–30.0)
Microalb, Ur: 1.1 mg/dL (ref 0.0–1.9)

## 2024-04-17 LAB — TSH: TSH: 0.67 u[IU]/mL (ref 0.35–5.50)

## 2024-04-17 MED ORDER — FREESTYLE LANCETS MISC: 3 refills | Status: AC

## 2024-04-17 MED ORDER — MOUNJARO 7.5 MG/0.5ML ~~LOC~~ SOAJ: 7.5000 mg | SUBCUTANEOUS | 1 refills | Status: AC

## 2024-04-17 MED ORDER — IBUPROFEN 800 MG PO TABS: 800.0000 mg | ORAL_TABLET | Freq: Three times a day (TID) | ORAL | 0 refills | Status: AC | PRN

## 2024-04-17 MED ORDER — CYCLOBENZAPRINE HCL 10 MG PO TABS: 10.0000 mg | ORAL_TABLET | Freq: Every day | ORAL | 0 refills | Status: AC

## 2024-04-17 MED ORDER — FREESTYLE LITE TEST VI STRP
ORAL_STRIP | 3 refills | Status: DC
Start: 2024-04-17 — End: 2024-04-17

## 2024-04-17 NOTE — Progress Notes (Signed)
 Subjective:    Patient ID: Kristin Merritt, female    DOB: May 15, 1961, 63 y.o.   MRN: [id number]  HPI Patient presents for yearly preventative medicine examination. She is a pleasant 63 year old female who  has a past medical history of Allergy, Anemia, Anxiety, Asthma, Constipation, Depression, GERD (gastroesophageal reflux disease), Heart murmur, High cholesterol, Hypertension, Leg cramps, Lichen sclerosus et atrophicus, Numbness and tingling in hands, Postmenopausal HRT (hormone replacement therapy) - followed by Dr. Winfred in gyn (07/11/2012), Swelling of both hands, Uterine cancer (HCC) (1991), UTI (urinary tract infection), VIN III (vulvar intraepithelial neoplasia III), and VIN III (vulvar intraepithelial neoplasia III).  DM Type 2 - In November 2023 she was started on Mounjaro .  Currently on 7.5 mg. She has been tolerating this medication well with minimal nausea. She is exercising three times a week and eating healthier. She feels good at her weight and wants to maintain where she is  Lab Results  Component Value Date   HGBA1C 5.9 (A) 10/20/2023   HGBA1C 5.4 07/21/2023   HGBA1C 5.8 04/13/2023   Wt Readings from Last 3 Encounters:  04/17/24 186 lb 12.8 oz (84.7 kg)  02/06/24 188 lb 3.2 oz (85.4 kg)  01/11/24 189 lb (85.7 kg)   Hypertension Managed with Hyzaar 100-25 mg daily. She has been checking her BP at home with readings in the 120-130's/70-80. She denies dizziness, lightheadedness, blurred vision.  BP Readings from Last 3 Encounters:  04/17/24 (!) 152/90  02/06/24 120/75  01/11/24 (!) 142/78    Hyperlipidemia-currently managed with Lipitor 20 mg daily.  She denies myalgia or fatigue Lab Results  Component Value Date   CHOL 169 04/13/2023   HDL 64.90 04/13/2023   LDLCALC 92 04/13/2023   TRIG 61.0 04/13/2023   CHOLHDL 3 04/13/2023   OSA - she had sleep study done in Dec 2023 which showed total of 43 apneas and 106 hyponeas.  He had a total of 146 obstructive events.   This recording.  There was a total of 95 desaturations.  The baseline oxygen level is 98% and the lowest oxygen level was 80%.    Low Back Pain - she will periodically have low back pain and uses Flexeril  and Advil  as needed  All immunizations and health maintenance protocols were reviewed with the patient and needed orders were placed. She will get his Tdap and Shingles at the pharmacy.   Appropriate screening laboratory values were ordered for the patient including screening of hyperlipidemia, renal function and hepatic function.  Medication reconciliation,  past medical history, social history, problem list and allergies were reviewed in detail with the patient  Goals were established with regard to weight loss, exercise, and  diet in compliance with medications  She is up to date on routine colon cancer screening. She is going to call and schedule her mammogram.   Review of Systems  Constitutional: Negative.   HENT: Negative.    Eyes: Negative.   Respiratory: Negative.    Cardiovascular: Negative.   Gastrointestinal: Negative.   Endocrine: Negative.   Genitourinary: Negative.   Musculoskeletal:  Positive for arthralgias and back pain.  Skin: Negative.   Allergic/Immunologic: Negative.   Neurological: Negative.   Hematological: Negative.   Psychiatric/Behavioral: Negative.     Past Medical History:  Diagnosis Date   Allergy    Anemia    as child   Anxiety    Asthma    Constipation    Depression    GERD (gastroesophageal reflux disease)  Heart murmur    High cholesterol    Hypertension    Leg cramps    Lichen sclerosus et atrophicus    Numbness and tingling in hands    Postmenopausal HRT (hormone replacement therapy) - followed by Dr. Winfred in gyn 07/11/2012   Swelling of both hands    Uterine cancer (HCC) 1991   UTI (urinary tract infection)    VIN III (vulvar intraepithelial neoplasia III)    left labia majora   VIN III (vulvar intraepithelial neoplasia  III)     Social History   Socioeconomic History   Marital status: Widowed    Spouse name: Not on file   Number of children: Not on file   Years of education: Not on file   Highest education level: 12th grade  Occupational History   Not on file  Tobacco Use   Smoking status: Former    Current packs/day: 0.00    Average packs/day: 0.3 packs/day for 20.0 years (5.0 ttl pk-yrs)    Types: Cigarettes    Start date: 02/21/1997    Quit date: 02/21/2017    Years since quitting: 7.1   Smokeless tobacco: Never  Vaping Use   Vaping status: Never Used  Substance and Sexual Activity   Alcohol  use: Yes    Comment: occ   Drug use: No   Sexual activity: Yes    Partners: Male    Birth control/protection: Surgical, Condom    Comment: 1st intercourse- 17, partners- 7, hysterectomy  Other Topics Concern   Not on file  Social History Narrative   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 Enjoys walking   Diet: Tries to watch what she eats. Does not eat a lot of processed foods or fast food.    Social Drivers of Corporate Investment Banker Strain: Low Risk  (07/20/2023)   Overall Financial Resource Strain (CARDIA)    Difficulty of Paying Living Expenses: Not hard at all  Food Insecurity: No Food Insecurity (07/20/2023)   Hunger Vital Sign    Worried About Running Out of Food in the Last Year: Never true    Ran Out of Food in the Last Year: Never true  Transportation Needs: No Transportation Needs (07/20/2023)   PRAPARE - Administrator, Civil Service (Medical): No    Lack of Transportation (Non-Medical): No  Physical Activity: Inactive (07/20/2023)   Exercise Vital Sign    Days of Exercise per Week: 0 days    Minutes of Exercise per Session: 0 min  Stress: No Stress Concern Present (07/20/2023)   [employer] of Occupational Health - Occupational Stress  Questionnaire    Feeling of Stress : Not at all  Social Connections: Moderately Integrated (07/20/2023)   Social Connection and Isolation Panel    Frequency of Communication with Friends and Family: More than three times a week    Frequency of Social Gatherings with Friends and Family: More than three times a week    Attends Religious Services: More than 4 times per year    Active Member of [employer] or Organizations: Yes    Attends Banker Meetings: More than 4 times per year    Marital Status: Widowed  Recent Concern: Social Connections - Moderately Isolated (07/10/2023)   Social Connection and Isolation Panel  Frequency of Communication with Friends and Family: More than three times a week    Frequency of Social Gatherings with Friends and Family: Twice a week    Attends Religious Services: More than 4 times per year    Active Member of [employer] or Organizations: No    Attends Banker Meetings: Not on file    Marital Status: Separated  Intimate Partner Violence: Not At Risk (07/20/2023)   Humiliation, Afraid, Rape, and Kick questionnaire    Fear of Current or Ex-Partner: No    Emotionally Abused: No    Physically Abused: No    Sexually Abused: No    Past Surgical History:  Procedure Laterality Date   BREAST EXCISIONAL BIOPSY Left 08/24/2016   times 2   EYE SURGERY     Lazy Muscle x 2- one as child and another 1998   SPINE SURGERY     L5-s1   VAGINAL HYSTERECTOMY     VULVA /PERINEUM BIOPSY N/A 11/18/2015   Procedure: WIDE LOCAL EXCISION OF VULVA and MEDIAL LEFT THIGH LESION ;  Surgeon: Curlee VEAR Guan, MD;  Location: [facility];  Service: Gynecology;  Laterality: N/A;   WISDOM TOOTH EXTRACTION      Family History  Problem Relation Age of Onset   Cancer Mother        LUNG    Breast cancer Mother        Died   Esophageal cancer Mother    Colon cancer Neg Hx    Colon polyps Neg Hx    Rectal cancer Neg Hx    Stomach cancer Neg Hx     No Known  Allergies  Current Outpatient Medications on File Prior to Visit  Medication Sig Dispense Refill   Albuterol Sulfate (PROAIR HFA IN) Inhale into the lungs. 1-2 puffs as needed every 4-6 hours for cough and wheezing     atorvastatin  (LIPITOR) 40 MG tablet TAKE 1 TABLET BY MOUTH EVERY DAY 90 tablet 0   Azelastine-Fluticasone  137-50 MCG/ACT SUSP Place 1 spray into both nostrils 2 (two) times daily.     docusate sodium (COLACE) 100 MG capsule      EPIPEN 2-PAK 0.3 MG/0.3ML SOAJ injection      FIBER ADULT GUMMIES PO Take by mouth.     glucose blood (FREESTYLE LITE) test strip Use to check blood sugar 3 times daily 300 each 3   Lancets (FREESTYLE) lancets Use to check blood sugar 3 times daily 300 each 3   lansoprazole  (PREVACID ) 30 MG capsule Take 1 capsule (30 mg total) by mouth daily. 90 capsule 3   levocetirizine (XYZAL) 5 MG tablet Take 5 mg by mouth every evening.     losartan -hydrochlorothiazide (HYZAAR) 100-25 MG tablet Take 1 tablet by mouth daily. 90 tablet 3   montelukast (SINGULAIR) 10 MG tablet Take 10 mg by mouth at bedtime.     MOUNJARO  7.5 MG/0.5ML Pen Inject 7.5 mg into the skin once a week.     potassium chloride  (KLOR-CON  M) 10 MEQ tablet Take 1 tablet (10 mEq total) by mouth 2 (two) times daily. Take 2 tabs together daily 180 tablet 1   VITAMIN D PO Take 400 Int'l Units by mouth.     cyclobenzaprine  (FLEXERIL ) 10 MG tablet Take 1 tablet (10 mg total) by mouth at bedtime. 15 tablet 0   ibuprofen  (ADVIL ) 800 MG tablet Take 1 tablet (800 mg total) by mouth every 8 (eight) hours as needed. (Patient not taking: Reported on 04/17/2024) 30  tablet 0   No current facility-administered medications on file prior to visit.    BP (!) 152/90   Pulse 67   Temp 98.8 F (37.1 C) (Oral)   Ht 5' 5.5 (1.664 m)   Wt 186 lb 12.8 oz (84.7 kg)   SpO2 96%   BMI 30.61 kg/m       Objective:   Physical Exam Vitals and nursing note reviewed.  Constitutional:      General: She is not in  acute distress.    Appearance: Normal appearance. She is not ill-appearing.  HENT:     Head: Normocephalic and atraumatic.     Right Ear: Tympanic membrane, ear canal and external ear normal. There is no impacted cerumen.     Left Ear: Tympanic membrane, ear canal and external ear normal. There is no impacted cerumen.     Nose: Nose normal. No congestion or rhinorrhea.     Mouth/Throat:     Mouth: Mucous membranes are moist.     Pharynx: Oropharynx is clear.  Eyes:     Extraocular Movements: Extraocular movements intact.     Conjunctiva/sclera: Conjunctivae normal.     Pupils: Pupils are equal, round, and reactive to light.  Neck:     Vascular: No carotid bruit.  Cardiovascular:     Rate and Rhythm: Normal rate and regular rhythm.     Pulses: Normal pulses.     Heart sounds: No murmur heard.    No friction rub. No gallop.  Pulmonary:     Effort: Pulmonary effort is normal.     Breath sounds: Normal breath sounds.  Abdominal:     General: Abdomen is flat. Bowel sounds are normal. There is no distension.     Palpations: Abdomen is soft. There is no mass.     Tenderness: There is no abdominal tenderness. There is no guarding or rebound.     Hernia: No hernia is present.  Musculoskeletal:        General: Normal range of motion.     Cervical back: Normal range of motion and neck supple.  Lymphadenopathy:     Cervical: No cervical adenopathy.  Skin:    General: Skin is warm and dry.     Capillary Refill: Capillary refill takes less than 2 seconds.  Neurological:     General: No focal deficit present.     Mental Status: She is alert and oriented to person, place, and time.  Psychiatric:        Mood and Affect: Mood normal.        Behavior: Behavior normal.        Thought Content: Thought content normal.        Judgment: Judgment normal.        Assessment & Plan:  1. Routine general medical examination at a health care facility (Primary) Today patient counseled on age  appropriate routine health concerns for screening and prevention, each reviewed and up to date or declined. Immunizations reviewed and up to date or declined. Labs ordered and reviewed. Risk factors for depression reviewed and negative. Hearing function and visual acuity are intact. ADLs screened and addressed as needed. Functional ability and level of safety reviewed and appropriate. Education, counseling and referrals performed based on assessed risks today. Patient provided with a copy of personalized plan for preventive services. - Follow up in one year or sooner if needed  - Eat healthy and exercise   2. Controlled type 2 diabetes mellitus with complication, without long-term  current use of insulin (HCC)  - CBC with Differential/Platelet; Future - Comprehensive metabolic panel with GFR; Future - Lipid panel; Future - TSH; Future - Hemoglobin A1c; Future - Microalbumin/Creatinine Ratio, Urine; Future - glucose blood (FREESTYLE LITE) test strip; Use to check blood sugar 3 times daily  Dispense: 300 each; Refill: 3 - Lancets (FREESTYLE) lancets; Use to check blood sugar 3 times daily  Dispense: 300 each; Refill: 3 - MOUNJARO  7.5 MG/0.5ML Pen; Inject 7.5 mg into the skin once a week.  Dispense: 6 mL; Refill: 1  3. Long-term current use of injectable noninsulin antidiabetic medication - Continue with Mounjaro  7.5 mg weekly.  - Follow up in 6 months likely  - CBC with Differential/Platelet; Future - Comprehensive metabolic panel with GFR; Future - Lipid panel; Future - TSH; Future - Hemoglobin A1c; Future - Microalbumin/Creatinine Ratio, Urine; Future  4. Essential hypertension, benign - Well controlled. No change in medication  - CBC with Differential/Platelet; Future - Comprehensive metabolic panel with GFR; Future - Lipid panel; Future - TSH; Future  5. Class 1 obesity - Continue with Mounjaro  7.5 mg weekly and lifestyle modifications  - CBC with Differential/Platelet; Future -  Comprehensive metabolic panel with GFR; Future - Lipid panel; Future - TSH; Future  6. Hyperlipidemia, unspecified hyperlipidemia type - Consider increase in statin  - CBC with Differential/Platelet; Future - Comprehensive metabolic panel with GFR; Future - Lipid panel; Future - TSH; Future  7. Sleep apnea, unspecified type - Continue to wear CPAP - CBC with Differential/Platelet; Future - Comprehensive metabolic panel with GFR; Future - Lipid panel; Future - TSH; Future  8. Acute bilateral low back pain with right-sided sciatica  - ibuprofen  (ADVIL ) 800 MG tablet; Take 1 tablet (800 mg total) by mouth every 8 (eight) hours as needed.  Dispense: 30 tablet; Refill: 0 - cyclobenzaprine  (FLEXERIL ) 10 MG tablet; Take 1 tablet (10 mg total) by mouth at bedtime.  Dispense: 15 tablet; Refill: 0  Darleene Shape, NP

## 2024-05-09 ENCOUNTER — Other Ambulatory Visit: Payer: Self-pay | Admitting: Radiology

## 2024-05-09 DIAGNOSIS — N3000 Acute cystitis without hematuria: Secondary | ICD-10-CM

## 2024-05-11 NOTE — Telephone Encounter (Signed)
 Med refill request: Macrobid  100 mg Last AEX: 12/13/23 Dr. Dallie Next AEX: 12/13/24 Last MMG (if hormonal med) n/a Dx: acute cystitis without hematuria. Refill denied.  Noted on RX for patient to contact our office.

## 2024-05-14 ENCOUNTER — Encounter: Payer: Self-pay | Admitting: Family Medicine

## 2024-05-14 ENCOUNTER — Ambulatory Visit: Admitting: Family Medicine

## 2024-05-14 VITALS — BP 136/84 | HR 82 | Temp 98.6°F | Ht 65.5 in | Wt 190.0 lb

## 2024-05-14 DIAGNOSIS — N3001 Acute cystitis with hematuria: Secondary | ICD-10-CM

## 2024-05-14 DIAGNOSIS — B379 Candidiasis, unspecified: Secondary | ICD-10-CM | POA: Diagnosis not present

## 2024-05-14 DIAGNOSIS — T3695XA Adverse effect of unspecified systemic antibiotic, initial encounter: Secondary | ICD-10-CM | POA: Diagnosis not present

## 2024-05-14 DIAGNOSIS — R350 Frequency of micturition: Secondary | ICD-10-CM | POA: Diagnosis not present

## 2024-05-14 LAB — POCT URINALYSIS DIPSTICK
Bilirubin, UA: NEGATIVE
Blood, UA: POSITIVE
Glucose, UA: NEGATIVE
Ketones, UA: NEGATIVE
Nitrite, UA: NEGATIVE
Protein, UA: POSITIVE — AB
Spec Grav, UA: 1.01
Urobilinogen, UA: 0.2 U/dL
pH, UA: 6.5

## 2024-05-14 MED ORDER — CEPHALEXIN 500 MG PO CAPS: 500.0000 mg | ORAL_CAPSULE | Freq: Three times a day (TID) | ORAL | 0 refills | Status: AC

## 2024-05-14 MED ORDER — FLUCONAZOLE 150 MG PO TABS: ORAL_TABLET | ORAL | 0 refills | Status: DC

## 2024-05-14 MED ORDER — PHENAZOPYRIDINE HCL 200 MG PO TABS: 200.0000 mg | ORAL_TABLET | Freq: Three times a day (TID) | ORAL | 0 refills | Status: AC | PRN

## 2024-05-14 NOTE — Patient Instructions (Signed)
-  It was a pleasure to care for you today.  -Urinalysis shows signs of a urinary tract infection. Will send urine for culture. Office will call with results and will be available via MyChart. -Prescribed Keflex  500mg  tablet, take 1 tablet every 8 hours for 5 days to treat the urinary tract infection. -Prescribed Pyridium  200mg  tablet every 8 hours as needed for urinary tract symptom relief.  -Last, prescribed Diflucan  150mg  tablet to take today for possible urinary tract infection. Repeat dose after last dose of Keflex . -Rest, hydrate with water. -Follow up if not improved.

## 2024-05-14 NOTE — Progress Notes (Signed)
 "  Acute Office Visit   Subjective:  Patient ID: Kristin Merritt, female    DOB: [birth year], 63 y.o.   MRN: [id number]  Chief Complaint  Patient presents with   Urinary Frequency    Urinary Frequency  Associated symptoms include frequency.   Patient is present for an acute visit with complaints of urinary frequency. Patient was unable to receive care from her primary care provider, Darleene Shape, NP, due to schedule availability.  The urinary frequency started about 3-4 days ago. Also, complains of increase in urinary urgency, pressure of needing to urinate,   Denies hematuria, dysuria, nausea, vomiting, fever, or lower back pain.   She has took Phenazopyridine  200mg  tablet once a day for the last 3 days.  Review of Systems  Genitourinary:  Positive for frequency.   See HPI above      Objective:   BP 136/84   Pulse 82   Temp 98.6 F (37 C) (Oral)   Ht 5' 5.5 (1.664 m)   Wt 190 lb (86.2 kg)   SpO2 96%   BMI 31.14 kg/m    Physical Exam Vitals reviewed.  Constitutional:      General: She is not in acute distress.    Appearance: Normal appearance. She is not ill-appearing, toxic-appearing or diaphoretic.  HENT:     Head: Normocephalic and atraumatic.  Eyes:     General:        Right eye: No discharge.        Left eye: No discharge.     Conjunctiva/sclera: Conjunctivae normal.  Cardiovascular:     Rate and Rhythm: Normal rate and regular rhythm.     Heart sounds: Normal heart sounds. No murmur heard.    No friction rub. No gallop.  Pulmonary:     Effort: Pulmonary effort is normal. No respiratory distress.     Breath sounds: Normal breath sounds.  Abdominal:     General: There is no distension.     Palpations: There is no mass.     Tenderness: There is no abdominal tenderness. There is no right CVA tenderness or left CVA tenderness.  Musculoskeletal:        General: Normal range of motion.  Skin:    General: Skin is warm and dry.  Neurological:     General:  No focal deficit present.     Mental Status: She is alert and oriented to person, place, and time. Mental status is at baseline.  Psychiatric:        Mood and Affect: Mood normal.        Behavior: Behavior normal.        Thought Content: Thought content normal.        Judgment: Judgment normal.    Results for orders placed or performed in visit on 05/14/24  POC Urinalysis Dipstick  Result Value Ref Range   Color, UA yellow    Clarity, UA clear    Glucose, UA Negative Negative   Bilirubin, UA negative    Ketones, UA negative    Spec Grav, UA 1.010 1.010 - 1.025   Blood, UA positive    pH, UA 6.5 5.0 - 8.0   Protein, UA Positive (A) Negative   Urobilinogen, UA 0.2 0.2 or 1.0 E.U./dL   Nitrite, UA negative    Leukocytes, UA Moderate (2+) (A) Negative   Appearance     Odor          Assessment & Plan:  Frequent urination -  POCT urinalysis dipstick -     Urine Culture  Acute cystitis with hematuria -     Cephalexin ; Take 1 capsule (500 mg total) by mouth 3 (three) times daily for 7 days.  Dispense: 21 capsule; Refill: 0 -     Phenazopyridine  HCl; Take 1 tablet (200 mg total) by mouth 3 (three) times daily as needed for pain.  Dispense: 10 tablet; Refill: 0  Antibiotic-induced yeast infection -     Fluconazole ; Take 1 tablet today and repeat dose after taking last dose of Keflex   Dispense: 2 tablet; Refill: 0  -Urinalysis shows signs of a urinary tract infection. Will send urine for culture. Office will call with results and will be available via MyChart. -Prescribed Keflex  500mg  tablet, take 1 tablet every 8 hours for 5 days to treat the urinary tract infection. -Prescribed Pyridium  200mg  tablet every 8 hours as needed for urinary tract symptom relief.  -Last, prescribed Diflucan  150mg  tablet to take today for possible urinary tract infection. Repeat dose after last dose of Keflex . -Rest, hydrate with water. -Follow up if not improved.   Markasia Carrol, NP "

## 2024-05-15 LAB — URINE CULTURE
MICRO NUMBER:: 17385730
SPECIMEN QUALITY:: ADEQUATE

## 2024-05-16 ENCOUNTER — Ambulatory Visit: Payer: Self-pay | Admitting: Family Medicine

## 2024-05-31 ENCOUNTER — Encounter: Payer: Self-pay | Admitting: Adult Health

## 2024-05-31 ENCOUNTER — Ambulatory Visit: Admitting: Adult Health

## 2024-05-31 VITALS — BP 130/80 | HR 65 | Temp 98.8°F | Ht 65.5 in | Wt 189.0 lb

## 2024-05-31 DIAGNOSIS — R8290 Unspecified abnormal findings in urine: Secondary | ICD-10-CM

## 2024-05-31 DIAGNOSIS — R058 Other specified cough: Secondary | ICD-10-CM

## 2024-05-31 LAB — POCT URINALYSIS DIPSTICK
Bilirubin, UA: NEGATIVE
Blood, UA: NEGATIVE
Glucose, UA: NEGATIVE
Ketones, UA: NEGATIVE
Leukocytes, UA: NEGATIVE
Nitrite, UA: NEGATIVE
Protein, UA: NEGATIVE
Spec Grav, UA: 1.02
Urobilinogen, UA: 0.2 U/dL
pH, UA: 6

## 2024-05-31 MED ORDER — BENZONATATE 200 MG PO CAPS: 200.0000 mg | ORAL_CAPSULE | Freq: Three times a day (TID) | ORAL | 2 refills | Status: AC | PRN

## 2024-05-31 NOTE — Progress Notes (Signed)
 "  Subjective:    Patient ID: Kristin Merritt, female    DOB: 05-Nov-1960, 64 y.o.   MRN: [id number]  Diabetes   64 year old female who  has a past medical history of Allergy, Anemia, Anxiety, Asthma, Constipation, Depression, GERD (gastroesophageal reflux disease), Heart murmur, High cholesterol, Hypertension, Leg cramps, Lichen sclerosus et atrophicus, Numbness and tingling in hands, Postmenopausal HRT (hormone replacement therapy) - followed by Dr. Winfred in gyn (07/11/2012), Swelling of both hands, Uterine cancer (HCC) (1991), UTI (urinary tract infection), VIN III (vulvar intraepithelial neoplasia III), and VIN III (vulvar intraepithelial neoplasia III).  Discussed the use of AI scribe software for clinical note transcription with the patient, who gave verbal consent to proceed.  History of Present Illness   Kristin Merritt is a 64 year old female who presents with concerns about a urinary tract infection and respiratory symptoms.  Around Christmas she developed urinary symptoms and was evaluated with urinalyses that showed leukocytes, trace blood, and protein, but urine culture showed mixed flora without confirmed infection. She completed a course of Keflex . She is no longer having symptoms but was not sure what the ' mixed flora; meant.   She currently has improving but persistent upper respiratory symptoms with sniffles and chest congestion with phlegm. She is not using any medications such as Flonase  or Claritin  for these symptoms.        Review of Systems See HPI   Past Medical History:  Diagnosis Date   Allergy    Anemia    as child   Anxiety    Asthma    Constipation    Depression    GERD (gastroesophageal reflux disease)    Heart murmur    High cholesterol    Hypertension    Leg cramps    Lichen sclerosus et atrophicus    Numbness and tingling in hands    Postmenopausal HRT (hormone replacement therapy) - followed by Dr. Winfred in gyn 07/11/2012   Swelling of both  hands    Uterine cancer (HCC) 1991   UTI (urinary tract infection)    VIN III (vulvar intraepithelial neoplasia III)    left labia majora   VIN III (vulvar intraepithelial neoplasia III)     Social History   Socioeconomic History   Marital status: Widowed    Spouse name: Not on file   Number of children: Not on file   Years of education: Not on file   Highest education level: 12th grade  Occupational History   Not on file  Tobacco Use   Smoking status: Former    Current packs/day: 0.00    Average packs/day: 0.3 packs/day for 20.0 years (5.0 ttl pk-yrs)    Types: Cigarettes    Start date: 02/21/1997    Quit date: 02/21/2017    Years since quitting: 7.2   Smokeless tobacco: Never  Vaping Use   Vaping status: Never Used  Substance and Sexual Activity   Alcohol  use: Yes    Comment: occ   Drug use: No   Sexual activity: Yes    Partners: Male    Birth control/protection: Surgical, Condom    Comment: 1st intercourse- 17, partners- 7, hysterectomy  Other Topics Concern   Not on file  Social History Narrative   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  Enjoys walking   Diet: Tries to watch what she eats. Does not eat a lot of processed foods or fast food.    Social Drivers of Health   Tobacco Use: Medium Risk (05/31/2024)   Patient History    Smoking Tobacco Use: Former    Smokeless Tobacco Use: Never    Passive Exposure: Not on file  Financial Resource Strain: Low Risk (05/13/2024)   Overall Financial Resource Strain (CARDIA)    Difficulty of Paying Living Expenses: Not hard at all  Food Insecurity: No Food Insecurity (05/13/2024)   Epic    Worried About Programme Researcher, Broadcasting/film/video in the Last Year: Never true    Ran Out of Food in the Last Year: Never true  Transportation Needs: No Transportation Needs (05/13/2024)   Epic    Lack of Transportation (Medical): No     Lack of Transportation (Non-Medical): No  Physical Activity: Sufficiently Active (05/13/2024)   Exercise Vital Sign    Days of Exercise per Week: 5 days    Minutes of Exercise per Session: 30 min  Stress: No Stress Concern Present (07/20/2023)   [employer] of Occupational Health - Occupational Stress Questionnaire    Feeling of Stress : Not at all  Social Connections: Moderately Integrated (05/13/2024)   Social Connection and Isolation Panel    Frequency of Communication with Friends and Family: More than three times a week    Frequency of Social Gatherings with Friends and Family: Three times a week    Attends Religious Services: More than 4 times per year    Active Member of Clubs or Organizations: Yes    Attends Banker Meetings: More than 4 times per year    Marital Status: Widowed  Intimate Partner Violence: Not At Risk (07/20/2023)   Humiliation, Afraid, Rape, and Kick questionnaire    Fear of Current or Ex-Partner: No    Emotionally Abused: No    Physically Abused: No    Sexually Abused: No  Depression (PHQ2-9): Low Risk (05/14/2024)   Depression (PHQ2-9)    PHQ-2 Score: 0  Alcohol  Screen: Low Risk (05/13/2024)   Alcohol  Screen    Last Alcohol  Screening Score (AUDIT): 2  Housing: Unknown (05/13/2024)   Epic    Unable to Pay for Housing in the Last Year: No    Number of Times Moved in the Last Year: Not on file    Homeless in the Last Year: No  Utilities: Not At Risk (07/20/2023)   AHC Utilities    Threatened with loss of utilities: No  Health Literacy: Adequate Health Literacy (07/20/2023)   B1300 Health Literacy    Frequency of need for help with medical instructions: Never    Past Surgical History:  Procedure Laterality Date   BREAST EXCISIONAL BIOPSY Left 08/24/2016   times 2   EYE SURGERY     Lazy Muscle x 2- one as child and another 1998   SPINE SURGERY     L5-s1   VAGINAL HYSTERECTOMY     VULVA /PERINEUM BIOPSY N/A 11/18/2015    Procedure: WIDE LOCAL EXCISION OF VULVA and MEDIAL LEFT THIGH LESION ;  Surgeon: Curlee VEAR Guan, MD;  Location: [facility];  Service: Gynecology;  Laterality: N/A;   WISDOM TOOTH EXTRACTION      Family History  Problem Relation Age of Onset   Cancer Mother        LUNG    Breast cancer Mother        Died  Esophageal cancer Mother    Colon cancer Neg Hx    Colon polyps Neg Hx    Rectal cancer Neg Hx    Stomach cancer Neg Hx     Allergies[1]  Medications Ordered Prior to Encounter[2]  BP 130/80   Pulse 65   Temp 98.8 F (37.1 C) (Oral)   Ht 5' 5.5 (1.664 m)   Wt 189 lb (85.7 kg)   SpO2 98%   BMI 30.97 kg/m       Objective:   Physical Exam Vitals and nursing note reviewed.  Constitutional:      Appearance: Normal appearance.  HENT:     Nose: Nose normal. No congestion or rhinorrhea.  Cardiovascular:     Rate and Rhythm: Normal rate and regular rhythm.     Pulses: Normal pulses.     Heart sounds: Normal heart sounds.  Pulmonary:     Effort: Pulmonary effort is normal.     Breath sounds: Normal breath sounds.  Neurological:     General: No focal deficit present.     Mental Status: She is alert and oriented to person, place, and time.  Psychiatric:        Mood and Affect: Mood normal.        Behavior: Behavior normal.        Thought Content: Thought content normal.        Judgment: Judgment normal.        Assessment & Plan:  Assessment and Plan    Post-viral cough syndrome Persistent cough with phlegm post-viral infection. Symptoms improved but ongoing.  - Prescribed Tessalon  TID for cough management.   Abnormal Urinalysis  - UA negative      Darleene Shape, NP      [1] No Known Allergies [2]  Current Outpatient Medications on File Prior to Visit  Medication Sig Dispense Refill   Albuterol Sulfate (PROAIR HFA IN) Inhale into the lungs. 1-2 puffs as needed every 4-6 hours for cough and wheezing     atorvastatin  (LIPITOR) 40 MG tablet TAKE 1  TABLET BY MOUTH EVERY DAY 90 tablet 0   Azelastine-Fluticasone  137-50 MCG/ACT SUSP Place 1 spray into both nostrils 2 (two) times daily.     cyclobenzaprine  (FLEXERIL ) 10 MG tablet Take 1 tablet (10 mg total) by mouth at bedtime. 15 tablet 0   EPIPEN 2-PAK 0.3 MG/0.3ML SOAJ injection      fluconazole  (DIFLUCAN ) 150 MG tablet Take 1 tablet today and repeat dose after taking last dose of Keflex  2 tablet 0   FREESTYLE LITE test strip USE TO CHECK BLOOD SUGAR 3 TIMES DAILY 300 strip 3   ibuprofen  (ADVIL ) 800 MG tablet Take 1 tablet (800 mg total) by mouth every 8 (eight) hours as needed. 30 tablet 0   Lancets (FREESTYLE) lancets Use to check blood sugar 3 times daily 300 each 3   lansoprazole  (PREVACID ) 30 MG capsule Take 1 capsule (30 mg total) by mouth daily. 90 capsule 3   losartan -hydrochlorothiazide (HYZAAR) 100-25 MG tablet Take 1 tablet by mouth daily. 90 tablet 3   montelukast (SINGULAIR) 10 MG tablet Take 10 mg by mouth at bedtime.     MOUNJARO  7.5 MG/0.5ML Pen Inject 7.5 mg into the skin once a week. 6 mL 1   phenazopyridine  (PYRIDIUM ) 200 MG tablet Take 1 tablet (200 mg total) by mouth 3 (three) times daily as needed for pain. 10 tablet 0   potassium chloride  (KLOR-CON  M) 10 MEQ tablet Take 1 tablet (10 mEq  total) by mouth 2 (two) times daily. Take 2 tabs together daily 180 tablet 1   VITAMIN D PO Take 400 Int'l Units by mouth.     No current facility-administered medications on file prior to visit.   "

## 2024-06-21 ENCOUNTER — Encounter: Payer: Self-pay | Admitting: Adult Health

## 2024-06-21 ENCOUNTER — Ambulatory Visit: Admitting: Adult Health

## 2024-06-21 VITALS — BP 130/80 | HR 89 | Temp 99.4°F | Ht 65.5 in | Wt 188.0 lb

## 2024-06-21 DIAGNOSIS — N390 Urinary tract infection, site not specified: Secondary | ICD-10-CM

## 2024-06-21 DIAGNOSIS — R300 Dysuria: Secondary | ICD-10-CM | POA: Diagnosis not present

## 2024-06-21 LAB — POCT URINALYSIS DIPSTICK
Bilirubin, UA: NEGATIVE
Blood, UA: POSITIVE
Glucose, UA: NEGATIVE
Ketones, UA: NEGATIVE
Nitrite, UA: POSITIVE
Protein, UA: POSITIVE — AB
Spec Grav, UA: 1.02
Urobilinogen, UA: 0.2 U/dL
pH, UA: 6

## 2024-06-21 MED ORDER — CIPROFLOXACIN HCL 500 MG PO TABS: 500.0000 mg | ORAL_TABLET | Freq: Two times a day (BID) | ORAL | 0 refills | Status: AC

## 2024-06-21 NOTE — Progress Notes (Signed)
 "  Subjective:    Patient ID: Kristin Merritt, female    DOB: 01-30-1961, 63 y.o.   MRN: [id number]  HPI  Discussed the use of AI scribe software for clinical note transcription with the patient, who gave verbal consent to proceed.  History of Present Illness   Kristin Merritt is a 64 year old female who presents with symptoms of a urinary tract infection.  She has had urinary urgency, frequency, and passing only small amounts of urine for about five days. She notes slight stinging with urination but not significant burning. She denies chills, fever, or feeling systemically ill.       Review of Systems See HPI   Past Medical History:  Diagnosis Date   Allergy    Anemia    as child   Anxiety    Asthma    Constipation    Depression    GERD (gastroesophageal reflux disease)    Heart murmur    High cholesterol    Hypertension    Leg cramps    Lichen sclerosus et atrophicus    Numbness and tingling in hands    Postmenopausal HRT (hormone replacement therapy) - followed by Dr. Winfred in gyn 07/11/2012   Swelling of both hands    Uterine cancer (HCC) 1991   UTI (urinary tract infection)    VIN III (vulvar intraepithelial neoplasia III)    left labia majora   VIN III (vulvar intraepithelial neoplasia III)     Social History   Socioeconomic History   Marital status: Widowed    Spouse name: Not on file   Number of children: Not on file   Years of education: Not on file   Highest education level: 12th grade  Occupational History   Not on file  Tobacco Use   Smoking status: Former    Current packs/day: 0.00    Average packs/day: 0.3 packs/day for 20.0 years (5.0 ttl pk-yrs)    Types: Cigarettes    Start date: 02/21/1997    Quit date: 02/21/2017    Years since quitting: 7.3   Smokeless tobacco: Never  Vaping Use   Vaping status: Never Used  Substance and Sexual Activity   Alcohol  use: Yes    Comment: occ   Drug use: No   Sexual activity: Yes    Partners: Male     Birth control/protection: Surgical, Condom    Comment: 1st intercourse- 17, partners- 7, hysterectomy  Other Topics Concern   Not on file  Social History Narrative   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 Enjoys walking   Diet: Tries to watch what she eats. Does not eat a lot of processed foods or fast food.    Social Drivers of Health   Tobacco Use: Medium Risk (06/21/2024)   Patient History    Smoking Tobacco Use: Former    Smokeless Tobacco Use: Never    Passive Exposure: Not on file  Financial Resource Strain: Low Risk (05/13/2024)   Overall Financial Resource Strain (CARDIA)    Difficulty of Paying Living Expenses: Not hard at all  Food Insecurity: No Food Insecurity (05/13/2024)   Epic    Worried About Programme Researcher, Broadcasting/film/video in the Last Year: Never true    Ran Out of Food in the Last Year: Never true  Transportation  Needs: No Transportation Needs (05/13/2024)   Epic    Lack of Transportation (Medical): No    Lack of Transportation (Non-Medical): No  Physical Activity: Sufficiently Active (05/13/2024)   Exercise Vital Sign    Days of Exercise per Week: 5 days    Minutes of Exercise per Session: 30 min  Stress: No Stress Concern Present (07/20/2023)   [employer] of Occupational Health - Occupational Stress Questionnaire    Feeling of Stress : Not at all  Social Connections: Moderately Integrated (05/13/2024)   Social Connection and Isolation Panel    Frequency of Communication with Friends and Family: More than three times a week    Frequency of Social Gatherings with Friends and Family: Three times a week    Attends Religious Services: More than 4 times per year    Active Member of Clubs or Organizations: Yes    Attends Banker Meetings: More than 4 times per year    Marital Status: Widowed  Intimate Partner Violence: Not At Risk  (07/20/2023)   Humiliation, Afraid, Rape, and Kick questionnaire    Fear of Current or Ex-Partner: No    Emotionally Abused: No    Physically Abused: No    Sexually Abused: No  Depression (PHQ2-9): Low Risk (05/14/2024)   Depression (PHQ2-9)    PHQ-2 Score: 0  Alcohol  Screen: Low Risk (05/13/2024)   Alcohol  Screen    Last Alcohol  Screening Score (AUDIT): 2  Housing: Unknown (05/13/2024)   Epic    Unable to Pay for Housing in the Last Year: No    Number of Times Moved in the Last Year: Not on file    Homeless in the Last Year: No  Utilities: Not At Risk (07/20/2023)   AHC Utilities    Threatened with loss of utilities: No  Health Literacy: Adequate Health Literacy (07/20/2023)   B1300 Health Literacy    Frequency of need for help with medical instructions: Never    Past Surgical History:  Procedure Laterality Date   BREAST EXCISIONAL BIOPSY Left 08/24/2016   times 2   EYE SURGERY     Lazy Muscle x 2- one as child and another 1998   SPINE SURGERY     L5-s1   VAGINAL HYSTERECTOMY     VULVA /PERINEUM BIOPSY N/A 11/18/2015   Procedure: WIDE LOCAL EXCISION OF VULVA and MEDIAL LEFT THIGH LESION ;  Surgeon: Curlee VEAR Guan, MD;  Location: [facility];  Service: Gynecology;  Laterality: N/A;   WISDOM TOOTH EXTRACTION      Family History  Problem Relation Age of Onset   Cancer Mother        LUNG    Breast cancer Mother        Died   Esophageal cancer Mother    Colon cancer Neg Hx    Colon polyps Neg Hx    Rectal cancer Neg Hx    Stomach cancer Neg Hx     Allergies[1]  Medications Ordered Prior to Encounter[2]  BP 130/80   Pulse 89   Temp 99.4 F (37.4 C) (Oral)   Ht 5' 5.5 (1.664 m)   Wt 188 lb (85.3 kg)   SpO2 97%   BMI 30.81 kg/m       Objective:   Physical Exam Vitals and nursing note reviewed.  Constitutional:      Appearance: Normal appearance.  Cardiovascular:     Rate and Rhythm: Regular rhythm.     Pulses: Normal pulses.     Heart  sounds: Normal  heart sounds.  Pulmonary:     Effort: Pulmonary effort is normal.     Breath sounds: Normal breath sounds.  Abdominal:     General: Abdomen is flat. Bowel sounds are normal.     Palpations: Abdomen is soft.     Tenderness: There is no abdominal tenderness. There is no right CVA tenderness or left CVA tenderness.  Musculoskeletal:        General: Normal range of motion.  Skin:    General: Skin is warm and dry.     Capillary Refill: Capillary refill takes less than 2 seconds.  Neurological:     General: No focal deficit present.     Mental Status: She is alert and oriented to person, place, and time.  Psychiatric:        Mood and Affect: Mood normal.        Behavior: Behavior normal.        Thought Content: Thought content normal.        Judgment: Judgment normal.         Assessment & Plan:  Assessment and Plan    Acute urinary tract infection Symptoms suggestive of lower urinary tract infection. - POC UA + leuks, nitrities, protein, and blood  - Prescribed Ciprofloxacin  500 mg twice daily for three days.      Darleene Shape, NP       [1] No Known Allergies [2]  Current Outpatient Medications on File Prior to Visit  Medication Sig Dispense Refill   Albuterol Sulfate (PROAIR HFA IN) Inhale into the lungs. 1-2 puffs as needed every 4-6 hours for cough and wheezing     atorvastatin  (LIPITOR) 40 MG tablet TAKE 1 TABLET BY MOUTH EVERY DAY 90 tablet 0   Azelastine-Fluticasone  137-50 MCG/ACT SUSP Place 1 spray into both nostrils 2 (two) times daily.     benzonatate  (TESSALON ) 200 MG capsule Take 1 capsule (200 mg total) by mouth 3 (three) times daily as needed. 30 capsule 2   cyclobenzaprine  (FLEXERIL ) 10 MG tablet Take 1 tablet (10 mg total) by mouth at bedtime. 15 tablet 0   EPIPEN 2-PAK 0.3 MG/0.3ML SOAJ injection      fluconazole  (DIFLUCAN ) 150 MG tablet Take 1 tablet today and repeat dose after taking last dose of Keflex  2 tablet 0   FREESTYLE LITE test strip USE TO  CHECK BLOOD SUGAR 3 TIMES DAILY 300 strip 3   ibuprofen  (ADVIL ) 800 MG tablet Take 1 tablet (800 mg total) by mouth every 8 (eight) hours as needed. 30 tablet 0   Lancets (FREESTYLE) lancets Use to check blood sugar 3 times daily 300 each 3   lansoprazole  (PREVACID ) 30 MG capsule Take 1 capsule (30 mg total) by mouth daily. 90 capsule 3   losartan -hydrochlorothiazide (HYZAAR) 100-25 MG tablet Take 1 tablet by mouth daily. 90 tablet 3   montelukast (SINGULAIR) 10 MG tablet Take 10 mg by mouth at bedtime.     MOUNJARO  7.5 MG/0.5ML Pen Inject 7.5 mg into the skin once a week. 6 mL 1   phenazopyridine  (PYRIDIUM ) 200 MG tablet Take 1 tablet (200 mg total) by mouth 3 (three) times daily as needed for pain. 10 tablet 0   potassium chloride  (KLOR-CON  M) 10 MEQ tablet Take 1 tablet (10 mEq total) by mouth 2 (two) times daily. Take 2 tabs together daily 180 tablet 1   VITAMIN D PO Take 400 Int'l Units by mouth.     No current facility-administered medications on file  prior to visit.   "

## 2024-06-23 LAB — URINE CULTURE
MICRO NUMBER:: 17527412
SPECIMEN QUALITY:: ADEQUATE

## 2024-06-25 ENCOUNTER — Other Ambulatory Visit: Payer: Self-pay | Admitting: Family Medicine

## 2024-06-25 DIAGNOSIS — B379 Candidiasis, unspecified: Secondary | ICD-10-CM

## 2024-06-26 ENCOUNTER — Ambulatory Visit: Payer: Self-pay | Admitting: Adult Health

## 2024-07-20 ENCOUNTER — Ambulatory Visit: Payer: Medicare HMO

## 2024-12-13 ENCOUNTER — Encounter: Admitting: Obstetrics and Gynecology
